# Patient Record
Sex: Female | Born: 1950 | Race: White | Hispanic: No | Smoking: Former smoker
Health system: Southern US, Community
[De-identification: ages and names within clinical notes are randomized; demographics above are authoritative.]

## PROBLEM LIST (undated history)

## (undated) DIAGNOSIS — R7303 Prediabetes: Secondary | ICD-10-CM

## (undated) DIAGNOSIS — D649 Anemia, unspecified: Secondary | ICD-10-CM

## (undated) DIAGNOSIS — T4145XA Adverse effect of unspecified anesthetic, initial encounter: Secondary | ICD-10-CM

## (undated) DIAGNOSIS — F32A Depression, unspecified: Secondary | ICD-10-CM

## (undated) DIAGNOSIS — E559 Vitamin D deficiency, unspecified: Secondary | ICD-10-CM

## (undated) DIAGNOSIS — M1990 Unspecified osteoarthritis, unspecified site: Secondary | ICD-10-CM

## (undated) DIAGNOSIS — T1491XA Suicide attempt, initial encounter: Secondary | ICD-10-CM

## (undated) DIAGNOSIS — K631 Perforation of intestine (nontraumatic): Secondary | ICD-10-CM

## (undated) DIAGNOSIS — G4700 Insomnia, unspecified: Secondary | ICD-10-CM

## (undated) DIAGNOSIS — F419 Anxiety disorder, unspecified: Secondary | ICD-10-CM

## (undated) DIAGNOSIS — J189 Pneumonia, unspecified organism: Secondary | ICD-10-CM

## (undated) DIAGNOSIS — K219 Gastro-esophageal reflux disease without esophagitis: Secondary | ICD-10-CM

## (undated) DIAGNOSIS — T8859XA Other complications of anesthesia, initial encounter: Secondary | ICD-10-CM

## (undated) DIAGNOSIS — E785 Hyperlipidemia, unspecified: Secondary | ICD-10-CM

## (undated) DIAGNOSIS — F329 Major depressive disorder, single episode, unspecified: Secondary | ICD-10-CM

## (undated) HISTORY — DX: Insomnia, unspecified: G47.00

## (undated) HISTORY — DX: Prediabetes: R73.03

## (undated) HISTORY — PX: REDUCTION MAMMAPLASTY: SUR839

## (undated) HISTORY — DX: Anxiety disorder, unspecified: F41.9

## (undated) HISTORY — DX: Vitamin D deficiency, unspecified: E55.9

## (undated) HISTORY — DX: Suicide attempt, initial encounter: T14.91XA

## (undated) HISTORY — DX: Perforation of intestine (nontraumatic): K63.1

## (undated) HISTORY — DX: Gastro-esophageal reflux disease without esophagitis: K21.9

## (undated) HISTORY — PX: DEBRIDEMENT TENNIS ELBOW: SHX1442

## (undated) HISTORY — DX: Hyperlipidemia, unspecified: E78.5

## (undated) HISTORY — DX: Depression, unspecified: F32.A

## (undated) HISTORY — PX: DILATION AND CURETTAGE OF UTERUS: SHX78

## (undated) HISTORY — PX: COSMETIC SURGERY: SHX468

## (undated) HISTORY — PX: EYE SURGERY: SHX253

## (undated) HISTORY — DX: Major depressive disorder, single episode, unspecified: F32.9

---

## 1997-08-07 HISTORY — PX: ABDOMINAL HYSTERECTOMY: SHX81

## 1997-09-10 ENCOUNTER — Other Ambulatory Visit: Admission: RE | Admit: 1997-09-10 | Discharge: 1997-09-10 | Payer: Self-pay | Admitting: Gynecology

## 1998-08-12 ENCOUNTER — Inpatient Hospital Stay (HOSPITAL_COMMUNITY): Admission: RE | Admit: 1998-08-12 | Discharge: 1998-08-13 | Payer: Self-pay | Admitting: Gynecology

## 1999-08-08 HISTORY — PX: BREAST SURGERY: SHX581

## 1999-08-18 ENCOUNTER — Other Ambulatory Visit: Admission: RE | Admit: 1999-08-18 | Discharge: 1999-08-18 | Payer: Self-pay | Admitting: Gynecology

## 1999-08-31 ENCOUNTER — Encounter: Payer: Self-pay | Admitting: Plastic Surgery

## 1999-08-31 ENCOUNTER — Encounter: Admission: RE | Admit: 1999-08-31 | Discharge: 1999-08-31 | Payer: Self-pay | Admitting: Plastic Surgery

## 1999-09-08 ENCOUNTER — Other Ambulatory Visit: Admission: RE | Admit: 1999-09-08 | Discharge: 1999-09-08 | Payer: Self-pay | Admitting: Plastic Surgery

## 2000-04-20 ENCOUNTER — Encounter: Payer: Self-pay | Admitting: Plastic Surgery

## 2000-04-20 ENCOUNTER — Encounter: Admission: RE | Admit: 2000-04-20 | Discharge: 2000-04-20 | Payer: Self-pay | Admitting: Plastic Surgery

## 2000-09-05 ENCOUNTER — Other Ambulatory Visit: Admission: RE | Admit: 2000-09-05 | Discharge: 2000-09-05 | Payer: Self-pay | Admitting: Gynecology

## 2001-07-26 ENCOUNTER — Encounter: Admission: RE | Admit: 2001-07-26 | Discharge: 2001-07-26 | Payer: Self-pay | Admitting: Gynecology

## 2001-07-26 ENCOUNTER — Encounter: Payer: Self-pay | Admitting: Gynecology

## 2001-10-17 ENCOUNTER — Other Ambulatory Visit: Admission: RE | Admit: 2001-10-17 | Discharge: 2001-10-17 | Payer: Self-pay | Admitting: Gynecology

## 2002-11-04 ENCOUNTER — Other Ambulatory Visit: Admission: RE | Admit: 2002-11-04 | Discharge: 2002-11-04 | Payer: Self-pay | Admitting: Gynecology

## 2002-11-28 ENCOUNTER — Encounter: Payer: Self-pay | Admitting: Gynecology

## 2002-11-28 ENCOUNTER — Encounter: Admission: RE | Admit: 2002-11-28 | Discharge: 2002-11-28 | Payer: Self-pay | Admitting: Gynecology

## 2004-05-26 ENCOUNTER — Encounter: Admission: RE | Admit: 2004-05-26 | Discharge: 2004-05-26 | Payer: Self-pay | Admitting: Gynecology

## 2005-06-01 ENCOUNTER — Ambulatory Visit (HOSPITAL_COMMUNITY): Admission: RE | Admit: 2005-06-01 | Discharge: 2005-06-01 | Payer: Self-pay | Admitting: Gynecology

## 2005-08-07 HISTORY — PX: ELBOW SURGERY: SHX618

## 2005-09-27 ENCOUNTER — Other Ambulatory Visit: Admission: RE | Admit: 2005-09-27 | Discharge: 2005-09-27 | Payer: Self-pay | Admitting: Gynecology

## 2006-07-18 LAB — HM COLONOSCOPY: HM Colonoscopy: NORMAL

## 2006-11-05 ENCOUNTER — Ambulatory Visit (HOSPITAL_COMMUNITY): Admission: RE | Admit: 2006-11-05 | Discharge: 2006-11-05 | Payer: Self-pay | Admitting: Gynecology

## 2008-01-16 ENCOUNTER — Ambulatory Visit (HOSPITAL_COMMUNITY): Admission: RE | Admit: 2008-01-16 | Discharge: 2008-01-16 | Payer: Self-pay | Admitting: Gynecology

## 2008-08-07 HISTORY — PX: BLEPHAROPLASTY: SUR158

## 2008-08-07 HISTORY — PX: ULNAR TUNNEL RELEASE: SHX820

## 2009-01-14 ENCOUNTER — Ambulatory Visit (HOSPITAL_COMMUNITY): Admission: RE | Admit: 2009-01-14 | Discharge: 2009-01-14 | Payer: Self-pay | Admitting: Obstetrics & Gynecology

## 2009-01-19 ENCOUNTER — Ambulatory Visit (HOSPITAL_COMMUNITY): Admission: RE | Admit: 2009-01-19 | Discharge: 2009-01-19 | Payer: Self-pay | Admitting: Obstetrics & Gynecology

## 2009-01-27 ENCOUNTER — Ambulatory Visit (HOSPITAL_BASED_OUTPATIENT_CLINIC_OR_DEPARTMENT_OTHER): Admission: RE | Admit: 2009-01-27 | Discharge: 2009-01-27 | Payer: Self-pay | Admitting: *Deleted

## 2009-04-03 ENCOUNTER — Ambulatory Visit: Payer: Self-pay | Admitting: Family Medicine

## 2009-04-03 ENCOUNTER — Inpatient Hospital Stay (HOSPITAL_COMMUNITY): Admission: EM | Admit: 2009-04-03 | Discharge: 2009-04-04 | Payer: Self-pay | Admitting: Emergency Medicine

## 2009-04-04 ENCOUNTER — Ambulatory Visit: Payer: Self-pay | Admitting: Psychiatry

## 2010-03-22 LAB — CONVERTED CEMR LAB: Pap Smear: NORMAL

## 2010-03-22 LAB — HM PAP SMEAR: HM Pap smear: NORMAL

## 2010-04-19 ENCOUNTER — Ambulatory Visit (HOSPITAL_COMMUNITY): Admission: RE | Admit: 2010-04-19 | Discharge: 2010-04-19 | Payer: Self-pay | Admitting: Obstetrics & Gynecology

## 2010-07-21 ENCOUNTER — Encounter: Payer: Self-pay | Admitting: Family Medicine

## 2010-07-21 ENCOUNTER — Ambulatory Visit: Payer: Self-pay | Admitting: Family Medicine

## 2010-07-21 DIAGNOSIS — E785 Hyperlipidemia, unspecified: Secondary | ICD-10-CM | POA: Insufficient documentation

## 2010-07-21 DIAGNOSIS — Z8709 Personal history of other diseases of the respiratory system: Secondary | ICD-10-CM | POA: Insufficient documentation

## 2010-07-21 DIAGNOSIS — K219 Gastro-esophageal reflux disease without esophagitis: Secondary | ICD-10-CM | POA: Insufficient documentation

## 2010-07-21 DIAGNOSIS — F32A Depression, unspecified: Secondary | ICD-10-CM | POA: Insufficient documentation

## 2010-07-21 DIAGNOSIS — F329 Major depressive disorder, single episode, unspecified: Secondary | ICD-10-CM

## 2010-07-29 ENCOUNTER — Ambulatory Visit: Payer: Self-pay | Admitting: Family Medicine

## 2010-08-04 ENCOUNTER — Encounter: Payer: Self-pay | Admitting: Family Medicine

## 2010-08-04 DIAGNOSIS — N390 Urinary tract infection, site not specified: Secondary | ICD-10-CM | POA: Insufficient documentation

## 2010-08-04 LAB — CONVERTED CEMR LAB
Bilirubin Urine: NEGATIVE
Glucose, Urine, Semiquant: NEGATIVE
Ketones, urine, test strip: NEGATIVE
Nitrite: NEGATIVE
Protein, U semiquant: NEGATIVE
Specific Gravity, Urine: 1.01
Urobilinogen, UA: 0.2
pH: 5

## 2010-08-05 LAB — CONVERTED CEMR LAB
ALT: 14 units/L (ref 0–35)
AST: 23 units/L (ref 0–37)
Albumin: 4.1 g/dL (ref 3.5–5.2)
Alkaline Phosphatase: 44 units/L (ref 39–117)
BUN: 10 mg/dL (ref 6–23)
Basophils Absolute: 0 10*3/uL (ref 0.0–0.1)
Basophils Relative: 0.6 % (ref 0.0–3.0)
Bilirubin, Direct: 0.1 mg/dL (ref 0.0–0.3)
CO2: 29 meq/L (ref 19–32)
Calcium: 9.4 mg/dL (ref 8.4–10.5)
Chloride: 104 meq/L (ref 96–112)
Cholesterol: 305 mg/dL — ABNORMAL HIGH (ref 0–200)
Creatinine, Ser: 0.9 mg/dL (ref 0.4–1.2)
Direct LDL: 186.4 mg/dL
Eosinophils Absolute: 0.2 10*3/uL (ref 0.0–0.7)
Eosinophils Relative: 3.1 % (ref 0.0–5.0)
GFR calc non Af Amer: 70.13 mL/min (ref >60.00)
Glucose, Bld: 99 mg/dL (ref 70–99)
HCT: 38.1 % (ref 36.0–46.0)
HDL: 93.9 mg/dL (ref >39.00)
Hemoglobin: 12.9 g/dL (ref 12.0–15.0)
Lymphocytes Relative: 36.8 % (ref 12.0–46.0)
Lymphs Abs: 1.8 10*3/uL (ref 0.7–4.0)
MCHC: 33.8 g/dL (ref 30.0–36.0)
MCV: 94 fL (ref 78.0–100.0)
Monocytes Absolute: 0.3 10*3/uL (ref 0.1–1.0)
Monocytes Relative: 6.4 % (ref 3.0–12.0)
Neutro Abs: 2.6 10*3/uL (ref 1.4–7.7)
Neutrophils Relative %: 53.1 % (ref 43.0–77.0)
Platelets: 302 10*3/uL (ref 150.0–400.0)
Potassium: 4.6 meq/L (ref 3.5–5.1)
RBC: 4.06 M/uL (ref 3.87–5.11)
RDW: 14.6 % (ref 11.5–14.6)
Sodium: 141 meq/L (ref 135–145)
TSH: 2 microintl units/mL (ref 0.35–5.50)
Total Bilirubin: 0.6 mg/dL (ref 0.3–1.2)
Total CHOL/HDL Ratio: 3
Total Protein: 7 g/dL (ref 6.0–8.3)
Triglycerides: 134 mg/dL (ref 0.0–149.0)
VLDL: 26.8 mg/dL (ref 0.0–40.0)
WBC: 4.9 10*3/uL (ref 4.5–10.5)

## 2010-09-08 NOTE — Assessment & Plan Note (Signed)
Summary: new to estab/cpx/cbs   Vital Signs:  Patient profile:   60 year old female Menstrual status:  hysterectomy Height:      66.75 inches Weight:      153.8 pounds BMI:     24.36 Temp:     98.0 degrees F oral BP sitting:   126 / 74  Vitals Entered By: Almeta Monas CMA Duncan Dull) (July 21, 2010 2:25 PM) CC: New Est Care---CPX no pap or breast exam-- mammogram 8/11 pap 8/11-- colonoscopy 2007     Menstrual Status hysterectomy Last PAP Result normal   History of Present Illness: Pt here to establish and have cpe.   No pap.--  Pt sees Dr Leda Quail. Pt recently had pyleonephritis but never went to hospital.  She is finally better.    Preventive Screening-Counseling & Management  Alcohol-Tobacco     Alcohol drinks/day: <1     Alcohol type: beer, wine     Smoking Status: never  Caffeine-Diet-Exercise     Caffeine use/day: 6     Does Patient Exercise: yes     Type of exercise: gym     Times/week: <3  Hep-HIV-STD-Contraception     Dental Visit-last 6 months yes     Dental Care Counseling: not indicated; dental care within six months     SBE monthly: no     SBE Education/Counseling: to perform regular SBE      Sexual History:  same sex encounters and single.        Drug Use:  no.    Current Medications (verified): 1)  Wellbutrin Xl 300 Mg Xr24h-Tab (Bupropion Hcl) .Marland Kitchen.. 1 By Mouth Once Daily 2)  Terbinafine Hcl 250 Mg Tabs (Terbinafine Hcl) .Marland Kitchen.. 1 By Mouth Once Daily 3)  Zantac 150 Mg Tabs (Ranitidine Hcl) .Marland Kitchen.. 1 By Mouth Once Daily  Allergies (verified): No Known Drug Allergies  Past History:  Family History: Last updated: 07/21/2010 Family History Diabetes 1st degree relative--Mother Family History High cholesterol-- Father Family History Hypertension-- Mother  Social History: Last updated: 07/21/2010 Occupation:  [place] [employer] Single Never Smoked Alcohol use-yes--socially Drug use-no Regular exercise-yes  Risk Factors: Alcohol Use: <1  (07/21/2010) Caffeine Use: 6 (07/21/2010) Exercise: yes (07/21/2010)  Risk Factors: Smoking Status: never (07/21/2010)  Past Medical History: Depression GERD Hyperlipidemia Pneumonia, hx of Pyelonephritis  Past Surgical History: Hysterectomy--1999 Elbow surgery--2007 Breast Reduction--2001 Ulnar release-- 2010 Blephroplasty-- 2010  Family History: Reviewed history and no changes required. Family History Diabetes 1st degree relative--Mother Family History High cholesterol-- Father Family History Hypertension-- Mother  Social History: Reviewed history and no changes required. Occupation:  [employer] Single Never Smoked Alcohol use-yes--socially Drug use-no Regular exercise-yes Occupation:  employed Smoking Status:  never Drug Use:  no Does Patient Exercise:  yes Caffeine use/day:  6 Dental Care w/in 6 mos.:  yes Sexual History:  same sex encounters, single  Review of Systems      See HPI General:  Denies chills, fatigue, fever, loss of appetite, malaise, sleep disorder, sweats, weakness, and weight loss. Eyes:  Denies blurring, discharge, double vision, eye irritation, eye pain, halos, itching, light sensitivity, red eye, vision loss-1 eye, and vision loss-both eyes. ENT:  Denies decreased hearing, difficulty swallowing, ear discharge, earache, hoarseness, nasal congestion, nosebleeds, postnasal drainage, ringing in ears, sinus pressure, and sore throat. CV:  Denies bluish discoloration of lips or nails, chest pain or discomfort, difficulty breathing at night, difficulty breathing while lying down, fainting, fatigue, leg cramps with exertion, lightheadness, near fainting, palpitations, shortness of  breath with exertion, swelling of feet, swelling of hands, and weight gain. Resp:  Denies chest discomfort, chest pain with inspiration, cough, coughing up blood, excessive snoring, hypersomnolence, morning headaches, pleuritic, shortness of breath, sputum productive,  and wheezing. GI:  Denies abdominal pain, bloody stools, change in bowel habits, constipation, dark tarry stools, diarrhea, excessive appetite, gas, hemorrhoids, indigestion, loss of appetite, nausea, vomiting, vomiting blood, and yellowish skin color. GU:  Denies abnormal vaginal bleeding, decreased libido, discharge, dysuria, genital sores, hematuria, incontinence, nocturia, urinary frequency, and urinary hesitancy. MS:  Denies joint pain, joint redness, joint swelling, loss of strength, low back pain, mid back pain, muscle aches, muscle , cramps, muscle weakness, stiffness, and thoracic pain. Derm:  Denies changes in color of skin, changes in nail beds, dryness, excessive perspiration, flushing, hair loss, insect bite(s), itching, lesion(s), poor wound healing, and rash. Neuro:  Denies brief paralysis, difficulty with concentration, disturbances in coordination, falling down, headaches, inability to speak, memory loss, numbness, poor balance, seizures, sensation of room spinning, tingling, tremors, visual disturbances, and weakness. Psych:  Denies alternate hallucination ( auditory/visual), anxiety, depression, easily angered, easily tearful, irritability, mental problems, panic attacks, sense of great danger, suicidal thoughts/plans, thoughts of violence, unusual visions or sounds, and thoughts /plans of harming others. Endo:  Denies cold intolerance, excessive hunger, excessive thirst, excessive urination, heat intolerance, polyuria, and weight change. Heme:  Denies abnormal bruising, bleeding, enlarge lymph nodes, fevers, pallor, and skin discoloration. Allergy:  Denies hives or rash, itching eyes, persistent infections, seasonal allergies, and sneezing.  Physical Exam  General:  Well-developed,well-nourished,in no acute distress; alert,appropriate and cooperative throughout examination Head:  Normocephalic and atraumatic without obvious abnormalities. No apparent alopecia or balding. Eyes:  No  corneal or conjunctival inflammation noted. EOMI. Perrla. Funduscopic exam benign, without hemorrhages, exudates or papilledema. Vision grossly normal. Ears:  External ear exam shows no significant lesions or deformities.  Otoscopic examination reveals clear canals, tympanic membranes are intact bilaterally without bulging, retraction, inflammation or discharge. Hearing is grossly normal bilaterally. Nose:  External nasal examination shows no deformity or inflammation. Nasal mucosa are pink and moist without lesions or exudates. Mouth:  Oral mucosa and oropharynx without lesions or exudates.  Teeth in good repair. Neck:  No deformities, masses, or tenderness noted. Breasts:  gyn Lungs:  Normal respiratory effort, chest expands symmetrically. Lungs are clear to auscultation, no crackles or wheezes. Heart:  normal rate and no murmur.   Abdomen:  Bowel sounds positive,abdomen soft and non-tender without masses, organomegaly or hernias noted. Rectal:  gyn Genitalia:  gyn Msk:  normal ROM, no joint tenderness, no joint swelling, no joint warmth, no redness over joints, no joint deformities, no joint instability, and no crepitation.   Pulses:  R and L carotid,radial,femoral,dorsalis pedis and posterior tibial pulses are full and equal bilaterally Extremities:  No clubbing, cyanosis, edema, or deformity noted with normal full range of motion of all joints.   Neurologic:  No cranial nerve deficits noted. Station and gait are normal. Plantar reflexes are down-going bilaterally. DTRs are symmetrical throughout. Sensory, motor and coordinative functions appear intact. Skin:  Intact without suspicious lesions or rashes Cervical Nodes:  No lymphadenopathy noted Axillary Nodes:  No palpable lymphadenopathy Psych:  Cognition and judgment appear intact. Alert and cooperative with normal attention span and concentration. No apparent delusions, illusions, hallucinations   Impression & Recommendations:  Problem  # 1:  PREVENTIVE HEALTH CARE (ICD-V70.0)  check fasting labs check mamm ghm utd  Orders: EKG w/ Interpretation (93000)  Problem # 2:  HYPERLIPIDEMIA (ICD-272.4)  Orders: EKG w/ Interpretation (93000)  Problem # 3:  DEPRESSION (ICD-311)  Her updated medication list for this problem includes:    Wellbutrin Xl 300 Mg Xr24h-tab (Bupropion hcl) .Marland Kitchen... 1 by mouth once daily  Orders: EKG w/ Interpretation (93000)  Problem # 4:  GERD (ICD-530.81)  Her updated medication list for this problem includes:    Zantac 150 Mg Tabs (Ranitidine hcl) .Marland Kitchen... 1 by mouth once daily  Diagnostics Reviewed:  Discussed lifestyle modifications, diet, antacids/medications, and preventive measures. Handout provided.   Orders: EKG w/ Interpretation (93000)  Complete Medication List: 1)  Wellbutrin Xl 300 Mg Xr24h-tab (Bupropion hcl) .Marland Kitchen.. 1 by mouth once daily 2)  Terbinafine Hcl 250 Mg Tabs (Terbinafine hcl) .Marland Kitchen.. 1 by mouth once daily 3)  Zantac 150 Mg Tabs (Ranitidine hcl) .Marland Kitchen.. 1 by mouth once daily  Patient Instructions: 1)  v70.0  cbcd, bmp, hep, lipid, TSH, UA ---fasting labs   Orders Added: 1)  New Patient 40-64 years [99386] 2)  EKG w/ Interpretation [93000]   Immunization History:  Tetanus/Td Immunization History:    Tetanus/Td:  historical (08/08/2007)   Immunization History:  Tetanus/Td Immunization History:    Tetanus/Td:  Historical (08/08/2007)    Flu Vaccine Next Due:  Refused TD Result Date:  07/22/2008 TD Result:  given TD Next Due:  10 yr Colonoscopy Result Date:  07/18/2006 Colonoscopy Result:  normal Colonoscopy Next Due:  10 yr PAP Result Date:  03/22/2010 PAP Result:  normal PAP Next Due:  1 yr Mammogram Result Date:  03/22/2010 Mammogram Result:  normal Mammogram Next Due:  1 yr

## 2010-10-17 ENCOUNTER — Encounter (INDEPENDENT_AMBULATORY_CARE_PROVIDER_SITE_OTHER): Payer: Self-pay | Admitting: *Deleted

## 2010-10-17 ENCOUNTER — Telehealth (INDEPENDENT_AMBULATORY_CARE_PROVIDER_SITE_OTHER): Payer: Self-pay | Admitting: *Deleted

## 2010-10-25 NOTE — Letter (Signed)
Summary: Generic Letter  [facility] [place]  [address] [place], [place] [address]   Phone: [phone]  Fax: [phone]    10/17/2010    [name] [address] [place], [place]  [address]    To whom it may concern:    The above patient [name] has had a Physical Exam on 07/21/10 . If you have any question or concerns, please contact the office at [phone].         Sincerely,       Loreen Freud DO

## 2010-10-25 NOTE — Progress Notes (Signed)
Summary: Letter confirming physical  done....  ---- Converted from flag ---- ---- 10/14/2010 2:35 PM, [name] wrote: patient wants a letter mailed to her stating she had acpx in the last year - patient cpx was [id number] ------------------------------

## 2010-11-12 LAB — POCT I-STAT, CHEM 8
BUN: 3 mg/dL — ABNORMAL LOW (ref 6–23)
Calcium, Ion: 1.08 mmol/L — ABNORMAL LOW (ref 1.12–1.32)
Chloride: 102 mEq/L (ref 96–112)
Creatinine, Ser: 0.9 mg/dL (ref 0.4–1.2)
Glucose, Bld: 101 mg/dL — ABNORMAL HIGH (ref 70–99)
HCT: 38 % (ref 36.0–46.0)
Hemoglobin: 12.9 g/dL (ref 12.0–15.0)
Potassium: 3.8 mEq/L (ref 3.5–5.1)
Sodium: 137 mEq/L (ref 135–145)
TCO2: 23 mmol/L (ref 0–100)

## 2010-11-12 LAB — COMPREHENSIVE METABOLIC PANEL
ALT: 17 U/L (ref 0–35)
AST: 25 U/L (ref 0–37)
Albumin: 4 g/dL (ref 3.5–5.2)
Alkaline Phosphatase: 34 U/L — ABNORMAL LOW (ref 39–117)
BUN: 3 mg/dL — ABNORMAL LOW (ref 6–23)
CO2: 23 mEq/L (ref 19–32)
Calcium: 8.8 mg/dL (ref 8.4–10.5)
Chloride: 100 mEq/L (ref 96–112)
Creatinine, Ser: 0.75 mg/dL (ref 0.4–1.2)
GFR calc Af Amer: >60 mL/min (ref >60)
GFR calc non Af Amer: >60 mL/min (ref >60)
Glucose, Bld: 103 mg/dL — ABNORMAL HIGH (ref 70–99)
Potassium: 3.6 mEq/L (ref 3.5–5.1)
Sodium: 136 mEq/L (ref 135–145)
Total Bilirubin: 0.4 mg/dL (ref 0.3–1.2)
Total Protein: 6.5 g/dL (ref 6.0–8.3)

## 2010-11-12 LAB — BASIC METABOLIC PANEL
BUN: 5 mg/dL — ABNORMAL LOW (ref 6–23)
CO2: 24 mEq/L (ref 19–32)
Calcium: 9 mg/dL (ref 8.4–10.5)
Chloride: 108 mEq/L (ref 96–112)
Creatinine, Ser: 0.62 mg/dL (ref 0.4–1.2)
GFR calc Af Amer: >60 mL/min (ref >60)
GFR calc non Af Amer: >60 mL/min (ref >60)
Glucose, Bld: 76 mg/dL (ref 70–99)
Potassium: 4 mEq/L (ref 3.5–5.1)
Sodium: 141 mEq/L (ref 135–145)

## 2010-11-12 LAB — ETHANOL: Alcohol, Ethyl (B): 134 mg/dL — ABNORMAL HIGH (ref 0–10)

## 2010-11-12 LAB — TRICYCLICS SCREEN, URINE: TCA Scrn: NOT DETECTED

## 2010-11-12 LAB — RAPID URINE DRUG SCREEN, HOSP PERFORMED
Amphetamines: POSITIVE — AB
Barbiturates: NOT DETECTED
Benzodiazepines: POSITIVE — AB
Cocaine: NOT DETECTED
Opiates: POSITIVE — AB
Tetrahydrocannabinol: NOT DETECTED

## 2010-11-12 LAB — ACETAMINOPHEN LEVEL
Acetaminophen (Tylenol), Serum: <10 ug/mL — ABNORMAL LOW (ref 10–30)
Acetaminophen (Tylenol), Serum: <10 ug/mL — ABNORMAL LOW (ref 10–30)

## 2010-11-12 LAB — GLUCOSE, CAPILLARY: Glucose-Capillary: 107 mg/dL — ABNORMAL HIGH (ref 70–99)

## 2010-11-12 LAB — MAGNESIUM
Magnesium: 2.1 mg/dL (ref 1.5–2.5)
Magnesium: 2.4 mg/dL (ref 1.5–2.5)

## 2010-11-12 LAB — SALICYLATE LEVEL: Salicylate Lvl: <4 mg/dL (ref 2.8–20.0)

## 2010-11-14 LAB — POCT HEMOGLOBIN-HEMACUE: Hemoglobin: 15.2 g/dL — ABNORMAL HIGH (ref 12.0–15.0)

## 2010-12-20 NOTE — Discharge Summary (Signed)
NAMEGLORIE, DOWLEN             ACCOUNT NO.:  [id number]   MEDICAL RECORD NO.:  [id number]          PATIENT TYPE:  INP   LOCATION:  4740                         FACILITY:  [facility]   PHYSICIAN:  Leighton Roach McDiarmid, M.D.DATE OF BIRTH:  [birth year]   DATE OF ADMISSION:  04/03/2009  DATE OF DISCHARGE:  04/04/2009                               DISCHARGE SUMMARY   ADDENDUM:   FOLLOWUP INSTRUCTIONS:  The patient is advised to make an appointment  with her Dr. [name].  The patient states she has a scheduled appointment  towards the middle of this month and the patient was advised to keep  that appointment.  The patient was also advised to schedule an  appointment at her earliest convenience early this week to talk with Dr.  Valetta Close, her community therapist.  The patient states she was planning to  do this.   DISCHARGE INSTRUCTIONS:  The patient was instructed to call her regular  doctor or return in the emergency department if any chest pain,  difficulty breathing, mental status changes, intractable nausea and  vomiting, or any concerns.   DISCHARGE CONDITION:  As the patient was denying any suicidal ideations  at this time and was cleared the Psych consult.  The patient was  discharged to home in good medical condition.      Renold Don, MD  Electronically Signed      Leighton Roach McDiarmid, M.D.  Electronically Signed    JW/MEDQ  D:  04/04/2009  T:  04/04/2009  Job:  [id number]

## 2010-12-20 NOTE — Discharge Summary (Signed)
[name], [name]             ACCOUNT NO.:  [id number]   MEDICAL RECORD NO.:  [id number]          PATIENT TYPE:  INP   LOCATION:  4740                         FACILITY:  [facility]   PHYSICIAN:  Leighton Roach McDiarmid, M.D.DATE OF BIRTH:  1951-05-02   DATE OF ADMISSION:  04/03/2009  DATE OF DISCHARGE:  04/04/2009                               DISCHARGE SUMMARY   PRIMARY CARE PHYSICIAN:  Brett Canales A. [name], M.D., [facility].   DISCHARGE DIAGNOSES:  1. Intentional drug overdose.  2. Hyperlipidemia.   CONSULTATIONS:  Psychology.   PROCEDURES:  None.   LABORATORY DATA:  On admission, BMP within normal limits except for a  BUN of 3.  Acetaminophen level less than 10, ethyl alcohol level high at  134, salicylate level less than 4.  Urine drug screen positive for  amphetamines, benzodiazepines, opiates.  Tricyclics not detected.  Magnesium 2.1.  Discharge BMP within normal limits, BUN 5, magnesium of  blood 2.4.   BRIEF HOSPITAL COURSE:  This patient is a 60 year old female admitted  secondary to intentional drug overdose.   PROBLEMS:  Drug overdose.  The patient was admitted to Step Down.  AMA  protocol was started for withdrawal and prevention of seizures.  The  patient does not have any seizures in house.  There is a worry for  seizures as the patient was on Wellbutrin and have been prescribed  Wellbutrin by her primary care physician for depression.  Tylenol levels  remained below less than 10 and the entire time she was in house.  Electrocardiogram showed initially prolonged QT, which is side effect of  Wellbutrin.  However, EKG, the next day showed normal sinus rhythm with  normal QT.  BMET remained within normal limits for duration of in house  stay.  Psychology saw the patient, after evaluated the patient, stated  that the patient denied any suicidal ideations at this time.  Recommended, the patient was okay for discharge.  Also, recommended to  the patient that they  followup with their community therapist.  Prior to  admission, poison control was consulted and since they recommend that we  monitor for  withdrawal, the patient was started on thiamine, folate, and Narcan.  The patient's vital signs remained stable throughout hospital stay.  Due  to psych recommendation, the patient was discharged to home the day  after admission.      Renold Don, MD  Electronically Signed      Leighton Roach McDiarmid, M.D.  Electronically Signed    JW/MEDQ  D:  04/04/2009  T:  04/04/2009  Job:  [id number]

## 2010-12-20 NOTE — H&P (Signed)
[name], [name]             ACCOUNT NO.:  [id number]   MEDICAL RECORD NO.:  [id number]          PATIENT TYPE:  INP   LOCATION:  3301                         FACILITY:  [facility]   PHYSICIAN:  Leighton Roach McDiarmid, M.D.DATE OF BIRTH:  Jun 17, 1951   DATE OF ADMISSION:  04/03/2009  DATE OF DISCHARGE:                              HISTORY & PHYSICAL   PRIMARY CARE PHYSICIAN:  Brett Canales A. [name], MD, at [facility]   CHIEF COMPLAINT:  Intentional drug overdose.   HISTORY OF PRESENT ILLNESS:  This is a 60 year old female, who was  brought to the emergency room by EMS.  The patient was found unconscious  by her neighbor in her car.  EMS was called and enroute to [facility], EMS gave the patient Narcan.  The patient responded to Narcan,  and EMS was able to obtain very little information.  During my  examination, patient was very somnolent and could not participate in the  interview.  She was able to tell me her name but fell back asleep after  that.  During my physical exam, the patient woke up to say I am sad  and once again fell back asleep.  Most of the information from the  history came from [facility], the emergency room PA, and EMS.  I  also was able to reach [name] [name], PA at [facility],  and she was able to give me some of the information from reading the  chart notes.  Patient was seen yesterday by Dr. [name] when she endorsed  depression and anxiety.  After talking with the patient extensively for  over 1.5 hours, Dr. [name] prescribed Wellbutrin and Alprazolam for the  patient.  The patient at that time endorsed that she and her domestic  partner had been arguing for the last few weeks and that she was  concerned that her domestic partner was having an affair.  Looking  through the patient notes, [name] [name] was able to tell me that Dr.  [name] prescribed Wellbutrin-XL 150 mg, 7 tablets; Wellbutrin-XL 300 mg,  30 tablets; alprazolam 0.5 mg,  10 tablets.  After reaching [facility], they also inform us that patient picked up a prescription also  yesterday for diazepam 5 mg 6 tablets that was prescribed to her by her  ophthalmologist, Dr. [name], for an eye procedure.  Patient also was  prescribed hydromorphone 2 mg on January 27, 2009, by Dr. [name], who  is a Hydrographic surveyor.   PAST MEDICAL HISTORY:  1. Hyperlipidemia.  2. The rest of her history is unknown.   PAST SURGICAL HISTORY:  Unknown.   SOCIAL HISTORY:  The patient lives with her domestic partner, name  [name].   OCCUPATION:  The patient works at the [employer].   FAMILY HISTORY:  Unable to obtain.   REVIEW OF SYSTEMS:  Unable to obtain.   PHYSICAL EXAMINATION:  VITAL SIGNS:  Temperature 97.3, pulse 107,  respiratory 18, blood pressure 114/71, pulse-ox 100 % on 2 L.  GENERAL:  The patient is very somnolent and not  able to cooperate with exam, but  in no apparent distress or agitation.  HEENT:  A purplish contusion about 1.5 inches under left eye.  Pupils  are pinpoint and not reactive to light.  NECK:  No nodes, no masses,  supple.  CVS:  Tachy, otherwise regular, no murmurs.  LUNGS:  Clear to auscultation bilaterally, no wheezing.  ABDOMEN:  Soft, nontender, and nondistended, positive bowel sounds.  BACK:  No masses.  GU:  Deferred.  RECTAL:  Deferred.  EXTREMITIES:  No clubbing, cyanosis, or edema.  NEURO:  The patient cannot follow commands.  Reflexes +2 bilaterally.  The rest of the neuro exam not able to evaluate.   LABS AND STUDIES:  1. ISTAT last showed CO2 23, hemoglobin 12.9, hematocrit 38.0, sodium      137, potassium 3.8, chloride 102, bicarb 23, BUN 3, creatinine 0.9,      glucose 101.  2. Acetaminophen level less than 10.0.  3. Alcohol level 134.  4. Salicylate level less than 4.0.  5. Urine drug screen was positive for amphetamine, benzodiazepine,      opiates.  Triglycerides not detected.  C  6. MET  that showed sodium 136, potassium 3.6, chloride 100, bicarb 23,      BUN 3, creatinine 0.75, glucose 100.  Total bilirubin 0.4, alkaline      phosphatase 34, AST 25, ALT 17, total protein 6.5, albumin 4.0,      calcium 8.8.   ASSESSMENT AND PLAN:  This is a 57-year female, who was admitted for  intentional drug overdose.  1. Intentional overdose.  We will admit patient to stepdown.  Ativan      protocol will be started for withdrawal and seizures.  Wellbutrin      has a [name]-life of 18 hours.  This event seemed to have occurred      between the hours of 3 a.m. and admission.  We anticipate that      patient will go into withdrawal or have seizures later tonight and      for the next 2 days.  We will repeat Tylenol level in 4 hours.  We      will repeat electrocardiogram in the morning with concerns for a      prolonged QT, which is a side effect of Wellbutrin.  We will repeat      BMET in the a.m. with concerns for sodium level and other      electrolytes with concern for seizures.  We will consult Psychology      for Behavioral Health admission once the patient is stable and more      alert.  Poison Control was consulted by the emergency department      and have advised that we continue to monitor for withdrawal and      seizures.  They did not recommend any frequency of testing.  We      will start patient on thiamine, folate, and will have Narcan at      bedside.  Her labs currently showed that she is acidotic, her liver      function tests are within normal limits, and electrolytes are      normal.  Vital signs have been stable so far.  2. Contusion under  left eye.  We are concerned for a head injury.      Patient may have fallen.  We were not given this information.  We      will get a head computerized  tomography to evaluate.  3. Fluids, electrolytes, nutrition/gastrointestinal:  We will NPO the      patient for now until she is more alert.  We will start the patient       one-[name] normal saline at 125 ml per hour for maintenance.  4. Prophylaxis.  Heparin 5000 units subcutaneously q.8 hours.  5. Disposition.  We will continue close monitoring for withdrawal and      seizures.  We will consult Psychiatry.  We do not anticipate that      patient will be discharged this weekend.      Angeline Slim, MD  Electronically Signed      Leighton Roach McDiarmid, M.D.  Electronically Signed    CT/MEDQ  D:  04/03/2009  T:  04/03/2009  Job:  [id number]   cc:   Brett Canales A. [name], M.D.

## 2010-12-20 NOTE — Op Note (Signed)
NAMECASSITY, [name]             ACCOUNT NO.:  [id number]   MEDICAL RECORD NO.:  [id number]          PATIENT TYPE:  AMB   LOCATION:  DSC                          FACILITY:  [facility]   PHYSICIAN:  Tennis Must Meyerdierks, M.D.DATE OF BIRTH:  16-Oct-1950   DATE OF PROCEDURE:  01/27/2009  DATE OF DISCHARGE:                               OPERATIVE REPORT   PREOPERATIVE DIAGNOSIS:  Ulnar nerve compression, right elbow.   POSTOPERATIVE DIAGNOSIS:  Ulnar nerve compression, right elbow.   PROCEDURE:  Neurolysis of ulnar nerve, right elbow.   SURGEON:  Lowell Bouton, MD   ANESTHESIA:  Axillary block augmented with general.   OPERATIVE FINDINGS:  The patient had no signs of significant compression  of the nerve.  There appeared to be good circulation to the nerve.  It  was not subluxing over the medial epicondyle.   PROCEDURE:  Under general anesthesia with a tourniquet on the right arm,  the right arm was prepped and draped in usual fashion.  After  exsanguinating the limb, the tourniquet was inflated to 250 mmHg.  A  gently curved incision was made just behind the medial epicondyle and  carried down through the subcutaneous tissues.  Bleeding points were  coagulated.  Blunt dissection was carried down through the subcutaneous  tissues, and the ulnar nerve was identified at the medial epicondyle.  It was traced proximally approximately 6 cm above the medial epicondyle  and a neurolysis was performed.  It was then traced distally and care  was taken to protect the branches to the FCU.  After complete neurolysis  from 6 cm proximal to 6 cm distal, the elbow was flexed and extended and  there was no subluxation of the nerve.  The nerve was not  circumferentially released.  The wound was then irrigated copiously with  saline.  The subcutaneous tissues were closed with 4-0 Vicryl and the  skin with a 3-0 subcuticular Prolene.  Steri-Strips were applied  followed by sterile  dressings.  The patient tolerated the procedure well  and went to the recovery room awake and stable in good condition.      Lowell Bouton, M.D.  Electronically Signed     EMM/MEDQ  D:  01/27/2009  T:  01/27/2009  Job:  [id number]

## 2011-05-08 ENCOUNTER — Other Ambulatory Visit: Payer: Self-pay | Admitting: Obstetrics & Gynecology

## 2011-05-08 DIAGNOSIS — Z1231 Encounter for screening mammogram for malignant neoplasm of breast: Secondary | ICD-10-CM

## 2011-05-11 ENCOUNTER — Ambulatory Visit (HOSPITAL_COMMUNITY)
Admission: RE | Admit: 2011-05-11 | Discharge: 2011-05-11 | Disposition: A | Discharge status: Home / Self Care | Payer: PRIVATE HEALTH INSURANCE | Source: Ambulatory Visit | Attending: Obstetrics & Gynecology | Admitting: Obstetrics & Gynecology

## 2011-05-11 DIAGNOSIS — Z1231 Encounter for screening mammogram for malignant neoplasm of breast: Secondary | ICD-10-CM

## 2012-01-18 ENCOUNTER — Ambulatory Visit (INDEPENDENT_AMBULATORY_CARE_PROVIDER_SITE_OTHER): Payer: PRIVATE HEALTH INSURANCE | Admitting: Family Medicine

## 2012-01-18 ENCOUNTER — Encounter: Payer: Self-pay | Admitting: Family Medicine

## 2012-01-18 VITALS — BP 114/68 | HR 81 | Temp 98.6°F | Ht 67.0 in | Wt 154.2 lb

## 2012-01-18 DIAGNOSIS — G4700 Insomnia, unspecified: Secondary | ICD-10-CM

## 2012-01-18 MED ORDER — ESZOPICLONE 2 MG PO TABS: 2.0000 mg | ORAL_TABLET | Freq: Every day | ORAL | Status: DC

## 2012-01-18 NOTE — Patient Instructions (Addendum)

## 2012-01-18 NOTE — Progress Notes (Signed)
  Subjective:    Patient ID: [name], female    DOB: [birth year], 61 y.o.   MRN: [id number]  HPI Pt here c/o insomnia that she has had trouble with for years but has gotten worse in the last few months.  She is able to fall asleep but can not stay asleep.  She does snore some but has never been told it was bad.  She denies drinking alcohol late at night.      Review of Systems As above    Objective:   Physical Exam  Constitutional: She is oriented to person, place, and time. She appears well-developed and well-nourished.  Cardiovascular: Normal rate, regular rhythm and normal heart sounds.   No murmur heard. Pulmonary/Chest: Effort normal and breath sounds normal. No respiratory distress. She has no wheezes. She has no rales. She exhibits no tenderness.  Neurological: She is alert and oriented to person, place, and time.  Psychiatric: She has a normal mood and affect. Her behavior is normal. Judgment and thought content normal.          Assessment & Plan:  Insomnia---check ONO                   lunesta 2mg  ---not the night she has ono                     And not every night                   Sleep hygiene discussed with pt                   Consider --sleep eval

## 2012-04-18 ENCOUNTER — Telehealth: Payer: Self-pay | Admitting: Family Medicine

## 2012-04-18 NOTE — Telephone Encounter (Signed)
called needs 15-min f/u visit for labs to be reviewed from GYN lmom

## 2012-04-23 NOTE — Telephone Encounter (Signed)
Discussed with patient and she agreed to repeat labs in 3 months     KP 

## 2012-04-23 NOTE — Telephone Encounter (Signed)
Pt states she does not want to come into the office unless she has to. 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 She states she just took time off from work to move and cannot take more time off work. She states that if Dr. Laury Axon wants to go over the results, she would be glad to do it over the phone. I advised pt I would let Dr. Laury Axon know.

## 2012-04-23 NOTE — Telephone Encounter (Signed)
Labs sent to be scanned and Dr.Lowne aware that she did not want to come in to review labs.      KP

## 2012-06-28 ENCOUNTER — Ambulatory Visit (INDEPENDENT_AMBULATORY_CARE_PROVIDER_SITE_OTHER): Payer: PRIVATE HEALTH INSURANCE | Admitting: Emergency Medicine

## 2012-06-28 VITALS — BP 110/78 | HR 85 | Temp 98.0°F | Resp 16 | Ht 68.0 in | Wt 153.0 lb

## 2012-06-28 DIAGNOSIS — F32A Depression, unspecified: Secondary | ICD-10-CM

## 2012-06-28 DIAGNOSIS — F411 Generalized anxiety disorder: Secondary | ICD-10-CM

## 2012-06-28 DIAGNOSIS — F329 Major depressive disorder, single episode, unspecified: Secondary | ICD-10-CM

## 2012-06-28 MED ORDER — WELLBUTRIN XL 150 MG PO TB24: 150.0000 mg | ORAL_TABLET | Freq: Every day | ORAL | Status: DC
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
## 2012-07-14 NOTE — Progress Notes (Signed)
Reviewed and agree.

## 2012-10-11 ENCOUNTER — Ambulatory Visit: Payer: PRIVATE HEALTH INSURANCE | Admitting: Family Medicine

## 2012-10-18 ENCOUNTER — Encounter: Payer: Self-pay | Admitting: Family Medicine

## 2012-10-18 ENCOUNTER — Ambulatory Visit (INDEPENDENT_AMBULATORY_CARE_PROVIDER_SITE_OTHER): Payer: PRIVATE HEALTH INSURANCE | Admitting: Family Medicine

## 2012-10-18 VITALS — BP 128/75 | HR 74 | Temp 97.8°F | Resp 16 | Ht 68.0 in | Wt 153.0 lb

## 2012-10-18 DIAGNOSIS — M545 Low back pain, unspecified: Secondary | ICD-10-CM

## 2012-10-18 DIAGNOSIS — F418 Other specified anxiety disorders: Secondary | ICD-10-CM

## 2012-10-18 DIAGNOSIS — M4120 Other idiopathic scoliosis, site unspecified: Secondary | ICD-10-CM

## 2012-10-18 DIAGNOSIS — Z87440 Personal history of urinary (tract) infections: Secondary | ICD-10-CM

## 2012-10-18 DIAGNOSIS — F341 Dysthymic disorder: Secondary | ICD-10-CM

## 2012-10-18 DIAGNOSIS — G4700 Insomnia, unspecified: Secondary | ICD-10-CM

## 2012-10-18 LAB — POCT URINALYSIS DIPSTICK
Bilirubin, UA: NEGATIVE
Glucose, UA: NEGATIVE
Ketones, UA: NEGATIVE
Leukocytes, UA: NEGATIVE
Nitrite, UA: NEGATIVE
Protein, UA: NEGATIVE
Spec Grav, UA: <=1.005
Urobilinogen, UA: 0.2
pH, UA: 6

## 2012-10-18 MED ORDER — VENLAFAXINE HCL ER 75 MG PO CP24: ORAL_CAPSULE | ORAL | Status: DC

## 2012-10-18 MED ORDER — ESZOPICLONE 2 MG PO TABS: 2.0000 mg | ORAL_TABLET | Freq: Every day | ORAL | Status: DC

## 2012-10-18 NOTE — Patient Instructions (Addendum)
Your urine had a trace of blood but no other sign of infection; contact the office if your symptoms worsen. Drink plenty of water and stay well hydrated.

## 2012-10-20 NOTE — Progress Notes (Signed)
S;  This 62  Y.o. Cauc female has chronic depression and anxiety with recent  Increase of symptoms due to work-related pressures. Chronic insomnia is an issues also; pt attributes this to side effect of medication (sweating). 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 She has discussed her symptoms w/ a psychiatrist that she works with and has been advised to try a different medication. Pt  c/o excessive sweating w/ Wellbutrin. She has taken Zoloft in the past but had adverse effects. She thought she might try Celexa or Lexapro but the psychiatrist advised against that, given her past hx w/ SSRIs.  Pt also c/o LBP concentrated in L hip. SHe is concerned about this b/o past hx of pyelonephritis. She denies fever/ chills, n/v, abd or pelvic pain or dysuria. She denies recent trama. She has minor pain in other joints and takes OTC analgesic prn.  ROS: As per HPI. Pt has no thoughts of self-harm or harm of others; she is not agitated or had any behavior problems.  O: Filed Vitals:   10/18/12 1131  BP: 128/75  Pulse: 74  Temp: 97.8 F (36.6 C)  Resp: 16   GEN: In NAD; WN,WD. Pt  Is somewhat anxious. HENT: [place]/AT; EOMI w/ clear conj/ sclerae. Oroph clear and moist.  NECK: Supple w/o  LAN or TMG. SKIN: W&D; no erythema , rashes or pallor. COR: RRR. No edema. LUNGS: CTA; normal  resp and effort.  BACK: No CVAT. Mild thoracolumbar spine curvature. MS: L hip- posterior aspect tender at  SI joint. Moves all major joints w/o difficulty. No deformities in ext. No c/c/e. NEURO: A&O x 3; CNs intact. Nonfocal. Mentation- talkative but speech is coherent and not pressured; thought content and  judgement are appropriate.    Results for orders placed in visit on 10/18/12  POCT URINALYSIS DIPSTICK      Result Value Range   Color, UA yellow      Clarity, UA clear     Glucose, UA neg     Bilirubin, UA neg     Ketones, UA neg     Spec Grav, UA <=1.005     Blood, UA trace     pH, UA 6.0     Protein, UA neg     Urobilinogen, UA 0.2     Nitrite, UA neg     Leukocytes, UA Negative      A/P: Insomnia - Plan: eszopiclone (LUNESTA) 2 MG TABS  Depression with anxiety- RX: Effexor XR 75 mg daily for 1 week then increase to 2 capsules daily. Advised pt that sweating may be a side effect of this med also.  Idiopathic scoliosis - Plan: Ambulatory referral to Chiropractic  History of UTI - Plan: POCT urinalysis dipstick  LBP (low back pain) - Plan: Ambulatory referral to Chiropractic

## 2012-10-30 ENCOUNTER — Telehealth: Payer: Self-pay

## 2012-10-30 ENCOUNTER — Other Ambulatory Visit: Payer: Self-pay | Admitting: Family Medicine

## 2012-10-30 MED ORDER — VENLAFAXINE HCL ER 37.5 MG PO CP24: 37.5000 mg | ORAL_CAPSULE | Freq: Every day | ORAL | Status: DC

## 2012-10-30 NOTE — Telephone Encounter (Signed)
Thanks, called patient to advise.

## 2012-10-30 NOTE — Telephone Encounter (Signed)
Dr,Mcpherson, Pt would like to change the dosage on her effexor from 75mg  to 37.5 mg. Also pt would like to switch to the name brand because of the side effects associated with it which include nausea, and diarrhea.  Pharamcy: [facility]  [phone])    Best# [phone]

## 2012-10-30 NOTE — Telephone Encounter (Signed)
I called the Pharmacy and changed medication to Brand Effexor XR 37.5 mg  1 tab daily. Her insurance will cover it but her co-pay= $45.00. Please let her know this.

## 2012-10-30 NOTE — Progress Notes (Signed)
Per pt request, medication was changed to Brand Effexor 37.5 mg 1 capsule daily. I phoned this to pharmacy. Brand is covered but pt's co-pay= $45.00. She will be advised of this.

## 2013-01-24 ENCOUNTER — Ambulatory Visit: Payer: PRIVATE HEALTH INSURANCE | Admitting: Family Medicine

## 2013-04-17 ENCOUNTER — Encounter: Payer: Self-pay | Admitting: Obstetrics & Gynecology

## 2013-05-01 ENCOUNTER — Telehealth: Payer: Self-pay | Admitting: Obstetrics & Gynecology

## 2013-05-06 ENCOUNTER — Encounter: Payer: Self-pay | Admitting: Obstetrics & Gynecology

## 2013-05-06 ENCOUNTER — Ambulatory Visit (INDEPENDENT_AMBULATORY_CARE_PROVIDER_SITE_OTHER): Payer: PRIVATE HEALTH INSURANCE | Admitting: Obstetrics & Gynecology

## 2013-05-06 VITALS — BP 120/80 | HR 68 | Resp 16 | Ht 66.25 in | Wt 149.0 lb

## 2013-05-06 DIAGNOSIS — Z01419 Encounter for gynecological examination (general) (routine) without abnormal findings: Secondary | ICD-10-CM

## 2013-05-06 DIAGNOSIS — Z1239 Encounter for other screening for malignant neoplasm of breast: Secondary | ICD-10-CM

## 2013-05-06 DIAGNOSIS — Z0000 Encounter for general adult medical examination without abnormal findings: Secondary | ICD-10-CM

## 2013-05-06 LAB — POCT URINALYSIS DIPSTICK
Bilirubin, UA: NEGATIVE
Blood, UA: NEGATIVE
Glucose, UA: NEGATIVE
Ketones, UA: NEGATIVE
Nitrite, UA: NEGATIVE
Protein, UA: NEGATIVE
Urobilinogen, UA: NEGATIVE
pH, UA: 6

## 2013-05-06 LAB — HEMOGLOBIN, FINGERSTICK: Hemoglobin, fingerstick: 12.7 g/dL (ref 12.0–16.0)

## 2013-05-06 MED ORDER — ESZOPICLONE 2 MG PO TABS: 2.0000 mg | ORAL_TABLET | Freq: Every day | ORAL | Status: DC

## 2013-05-06 NOTE — Progress Notes (Signed)
62 y.o. G31P2 SingleCaucasianF here for annual exam.  No vaginal bleeding.  Has had some back pain issues this year.  Seeing a chiropractor for this and is better.     Patient's last menstrual period was 08/07/1998.          Sexually active: no  The current method of family planning is status post hysterectomy.    Exercising: yes  off and on gym Smoker:  no  Health Maintenance: Pap:  ?2009 normal History of abnormal Pap:  no MMG:  05/11/11 normal Colonoscopy:  2005/2006 repeat in 10 years BMD:   6/10 normal TDaP:  2012 Screening Labs: done 2013 here, Hb today: 12.7, Urine today: WBC-+1, PH-6   reports that she has never smoked. She has never used smokeless tobacco. She reports that she drinks about 5.0 ounces of alcohol per week. She reports that she does not use illicit drugs.  Past Medical History  Diagnosis Date  . Depression   . GERD (gastroesophageal reflux disease)   . Hyperlipidemia   . Diabetes mellitus without complication   . Anxiety   . Vitamin D deficiency   . Suicide attempt     Past Surgical History  Procedure Laterality Date  . Abdominal hysterectomy  1999  . Elbow surgery  2007  . Ulnar tunnel release  2010  . Blepharoplasty  2010  . Cosmetic surgery    . Eye surgery    . Debridement tennis elbow    . Breast surgery  2001    Breast reduction  . Dilation and curettage of uterus      Current Outpatient Prescriptions  Medication Sig Dispense Refill  . eszopiclone (LUNESTA) 2 MG TABS tablet Take 2 mg by mouth at bedtime. Take immediately before bedtime as needed       No current facility-administered medications for this visit.    Family History  Problem Relation Age of Onset  . Hyperlipidemia Father   . Hypertension Father   . Diabetes Father   . Cancer Father     History of bladder cancer  . Hypertension Mother   . Diabetes Mother   . Heart disease Maternal Grandmother   . Stroke Maternal Grandfather   . Hyperlipidemia Brother   . Cancer  Paternal Grandfather     pancreatic cancer  . Cancer Paternal Grandmother     uterine  . Heart disease Father   . Esophageal cancer Sister     ROS:  Pertinent items are noted in HPI.  Otherwise, a comprehensive ROS was negative.  Exam:   BP 120/80  Pulse 68  Resp 16  Ht 5' 6.25" (1.683 m)  Wt 149 lb (67.586 kg)  BMI 23.86 kg/m2  LMP 08/07/1998    Height: 5' 6.25" (168.3 cm)  Ht Readings from Last 3 Encounters:  05/06/13 5' 6.25" (1.683 m)  10/18/12 5\' 8"  (1.727 m)  06/28/12 5\' 8"  (1.727 m)    General appearance: alert, cooperative and appears stated age Head: Normocephalic, without obvious abnormality, atraumatic Neck: no adenopathy, supple, symmetrical, trachea midline and thyroid normal to inspection and palpation Lungs: clear to auscultation bilaterally Breasts: normal appearance, no masses or tenderness Heart: regular rate and rhythm Abdomen: soft, non-tender; bowel sounds normal; no masses,  no organomegaly Extremities: extremities normal, atraumatic, no cyanosis or edema Skin: Skin color, texture, turgor normal. No rashes or lesions Lymph nodes: Cervical, supraclavicular, and axillary nodes normal. No abnormal inguinal nodes palpated Neurologic: Grossly normal  Pelvic: External genitalia:  no lesions  Urethra:  normal appearing urethra with no masses, tenderness or lesions              Bartholins and Skenes: normal                 Vagina: normal appearing vagina with normal color and discharge, no lesions              Cervix: absent              Pap taken: no Bimanual Exam:  Uterus:  uterus absent              Adnexa: no mass, fullness, tenderness               Rectovaginal: Confirms               Anus:  normal sphincter tone, no lesions  A:  Well Woman with normal exam Elevated lipids H/O depression  P:   Mammogram due.  Appt scheduled. pap smear not indicated.  H/O TAH Lunesta 2mg  qhs prn.  #30/2 RF Vit D done today return annually or  prn  An After Visit Summary was printed and given to the patient.

## 2013-05-06 NOTE — Patient Instructions (Addendum)

## 2013-05-08 ENCOUNTER — Ambulatory Visit: Payer: PRIVATE HEALTH INSURANCE | Admitting: Obstetrics & Gynecology

## 2013-05-08 LAB — VITAMIN D 25 HYDROXY (VIT D DEFICIENCY, FRACTURES): Vit D, 25-Hydroxy: 32 ng/mL (ref 30–89)

## 2013-05-12 ENCOUNTER — Telehealth: Payer: Self-pay

## 2013-05-12 NOTE — Telephone Encounter (Signed)
Patient notified of lab results

## 2013-05-12 NOTE — Telephone Encounter (Signed)
lmtcb

## 2013-05-12 NOTE — Telephone Encounter (Signed)
Message copied by Elisha Headland on Mon May 12, 2013 10:19 AM ------      Message from: Jerene Bears      Created: Thu May 08, 2013  7:39 AM       Inform level 32 and she should take OTC vit d 1000 IU daily ------

## 2013-05-23 ENCOUNTER — Ambulatory Visit: Payer: PRIVATE HEALTH INSURANCE

## 2013-06-18 ENCOUNTER — Ambulatory Visit
Admission: RE | Admit: 2013-06-18 | Discharge: 2013-06-18 | Disposition: A | Discharge status: Home / Self Care | Payer: PRIVATE HEALTH INSURANCE | Source: Ambulatory Visit | Attending: Obstetrics & Gynecology | Admitting: Obstetrics & Gynecology

## 2013-06-18 DIAGNOSIS — Z1239 Encounter for other screening for malignant neoplasm of breast: Secondary | ICD-10-CM

## 2013-08-29 ENCOUNTER — Encounter: Payer: Self-pay | Admitting: Family Medicine

## 2013-08-29 ENCOUNTER — Ambulatory Visit (INDEPENDENT_AMBULATORY_CARE_PROVIDER_SITE_OTHER): Payer: PRIVATE HEALTH INSURANCE | Admitting: Family Medicine

## 2013-08-29 VITALS — BP 110/68 | HR 88 | Temp 98.8°F | Resp 16 | Ht 66.0 in | Wt 147.0 lb

## 2013-08-29 DIAGNOSIS — Z789 Other specified health status: Secondary | ICD-10-CM

## 2013-08-29 DIAGNOSIS — F341 Dysthymic disorder: Secondary | ICD-10-CM

## 2013-08-29 DIAGNOSIS — F418 Other specified anxiety disorders: Secondary | ICD-10-CM

## 2013-08-29 MED ORDER — ESZOPICLONE 2 MG PO TABS: 2.0000 mg | ORAL_TABLET | Freq: Every day | ORAL | Status: DC

## 2013-08-29 MED ORDER — CITALOPRAM HYDROBROMIDE 20 MG PO TABS: ORAL_TABLET | ORAL | Status: DC

## 2013-08-29 MED ORDER — CLONAZEPAM 0.5 MG PO TABS: ORAL_TABLET | ORAL | Status: DC

## 2013-08-30 NOTE — Progress Notes (Signed)
S;  This 63 y.o. Cauc female is here to discuss medication foe chronic depression w/ anxiety. She had been taking Wellbutrin since 2011 but decided to wean off this medication, feeling that she no longer needed it. She has been off the medication x 6 months. 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 Job responsibilities and other changes led to increased symptoms, so pt resigned from that position ~ 1 week ago. She is feeling less depressed. Now has a new position at the soon-to-open [employer] [employer]. 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 She wants to resume medication for anxiety/ depression but is concerned about weight gain w/ Zoloft or other SSRI. In addition to medication, she is in counseling; her therapist thinks she may have a mild case of PTSD (due to previous job situation). Pt has good social support system.  Patient Active Problem List   Diagnosis Date Noted  . HYPERLIPIDEMIA 07/21/2010  . DEPRESSION 07/21/2010  . GERD 07/21/2010  . PNEUMONIA, HX OF 07/21/2010   PMHx, Surg Hx, Soc and Fam Hx reviewed.  Medications reconciled.  ROS: Negative for loss or appetite, fatigue, CP or tightness, palpitations, SOB, abd pain or other GI problems, skin or hair changes, HA, dizziness, weakness numbness, tremor or syncope. No agitation, behavior disturbance, hyperactivity or confusion. Mild insomnia is chronic. No thoughts of self harm or SI/HI. Pt has hx of suicide attempt in Aug 2010 (medication OD).  O: Filed Vitals:   08/29/13 1034  BP: 110/68  Pulse: 88  Temp: 98.8 F (37.1 C)  Resp: 16   GEN: in NAD: WN,WD. Weight down 6 lbs. HENT: [place]/AT; EOMI w/ clear conj/sclerae. Otherwise unremarkable. NECK: SUpple w/o LAN or TMG. COR: RRR. LUNGS: Unlabored resp. SKIN: W&D; intact w/o diaphoresis, erythema or pallor. MS; MAEs; no joint  deformities or muscle atrophy. No c/c/e. NEURO" A&O x 3; CNs intact. Nonfocal. Gait normal w/ normal coordination. PSYCH: Pleasant and calm demeanor. Mildly anxious but not labile or inappropriate. Attentive w/ good eye contact. Speech pattern and thought content is normal. Judgement is sound.  A/P: Depression with anxiety- Discussed medication choices and pt is interested in trial of Celexa. She wants to start new job w/ minimal anxiety, able to concentrate/ focus; she will have to learn [employer]. She will start generic Celexa 20 mg 1/2 tablet every morning for 1-2 weeks; if she feels better , she can remain at that dose. If she still has some minor symptoms, she can increase to 20 mg daily dose.  She can use clonazepam 1/2 tablet twice daily if needed for anxiety. Sleep aid is prescribed for prn use.  History of recent change in lifestyle- Encouraged he to continued counseling and maintain positive supportive relationships w/ fellow hurses from previous job.  Pt encouraged to sign up for MyChart so that she can communicate w/ the office and w/ me. She will communicate how the medications are working for her and how she is adapting to the changes w/ life and work.   Meds ordered this encounter  Medications  . citalopram (CELEXA) 20 MG tablet    Sig: Take 1/2 - 1 tablet every morning or as directed.    Dispense:  30 tablet    Refill:  3  . eszopiclone (LUNESTA) 2 MG TABS tablet    Sig: Take 1 tablet (2 mg total) by mouth at bedtime. Take  immediately before bedtime as needed    Dispense:  30 tablet    Refill:  1  . clonazePAM (KLONOPIN) 0.5 MG tablet    Sig: Take 1/2 tablet bid prn anxiety.    Dispense:  20 tablet    Refill:  1

## 2013-11-24 ENCOUNTER — Other Ambulatory Visit: Payer: Self-pay | Admitting: Family Medicine

## 2013-11-25 NOTE — Telephone Encounter (Signed)
Eszopiclone 2mg  tablets  #15 w/ 0 RFs phoned to pt's pharmacy.

## 2013-11-27 ENCOUNTER — Ambulatory Visit (INDEPENDENT_AMBULATORY_CARE_PROVIDER_SITE_OTHER): Payer: 59 | Admitting: Family Medicine

## 2013-11-27 ENCOUNTER — Encounter: Payer: Self-pay | Admitting: Family Medicine

## 2013-11-27 VITALS — BP 108/74 | HR 83 | Temp 98.7°F | Resp 16 | Ht 66.0 in | Wt 151.2 lb

## 2013-11-27 DIAGNOSIS — K219 Gastro-esophageal reflux disease without esophagitis: Secondary | ICD-10-CM

## 2013-11-27 DIAGNOSIS — K1370 Unspecified lesions of oral mucosa: Secondary | ICD-10-CM

## 2013-11-27 DIAGNOSIS — Z1212 Encounter for screening for malignant neoplasm of rectum: Secondary | ICD-10-CM

## 2013-11-27 DIAGNOSIS — F329 Major depressive disorder, single episode, unspecified: Secondary | ICD-10-CM

## 2013-11-27 DIAGNOSIS — Z1211 Encounter for screening for malignant neoplasm of colon: Secondary | ICD-10-CM

## 2013-11-27 DIAGNOSIS — F3289 Other specified depressive episodes: Secondary | ICD-10-CM

## 2013-11-27 MED ORDER — ESZOPICLONE 2 MG PO TABS: ORAL_TABLET | ORAL | Status: DC

## 2013-11-27 MED ORDER — CITALOPRAM HYDROBROMIDE 20 MG PO TABS: ORAL_TABLET | ORAL | Status: DC

## 2013-11-27 MED ORDER — CITALOPRAM HYDROBROMIDE 10 MG PO TABS: ORAL_TABLET | ORAL | Status: DC

## 2013-11-28 NOTE — Progress Notes (Signed)
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 Citalopram 20 mg daily for depression is effective; pt would like to increase the dose. She is sleeping well and uses Clonazepam sparingly for bouts of anxiety. Sleep medication (Lunesta) is used < half the nights of the week. Pt has less fatigue, has a good appetite and no thoughts of self harm, SI/HI.  Pt discovered an painless oral lesion < 2 weeks ago; it is a clear bump inside lower lip. Lesion not present at recent dental exam. Pt is a nonsmoker; alcohol consumption is mild. Recalls a recent switch to new toothpaste; she had previously used Sensodyne. Concerned because sister has esophageal cancer. She also has chronic GERD but no upper GI evaluation.  Patient Active Problem List   Diagnosis Date Noted  . HYPERLIPIDEMIA 07/21/2010  . DEPRESSION 07/21/2010  . GERD 07/21/2010  . PNEUMONIA, HX OF 07/21/2010   PMHX, Surg Hx, Soc and Fam Hx reviewed.  MEDICATIONS reconciled.  ROS: As per HPI; negative for abnormal weight loss, fatigue, anorexia, sore throat, trouble swallowing, dysphagia, cough, CP or tightness, abd pain, n/v, change in stools, rashes or other skin changes, HA, dizziness or weakness.  O: Filed Vitals:   11/27/13 1007  BP: 108/74  Pulse: 83  Temp: 98.7 F (37.1 C)  Resp: 16   GEN: In NAD: WN,WD. HENT: [place]/AT; EOMI w/ clear conj/sclerae.EACs/ nose normal. Oral mucosa- small clear cystic NT lesion; normal mucosa otherwise. Gums/ dentition/ tongue unremarkable. NECK: Supple w/o LAN. COR: RRR. No edema. LUNGS: Normal resp rate and effort. SKIN: W&D; intact w/o diaphoresis, erythema or pallor. MS: MAEs; no c/c/e. NEURO: A&O x 3; CNs intact. Nonfocal. PSYCH: Pleasant and conversant. Calm demeanor w/ good eye contact. Speech pattern and thought content normal. Judgement is  sound.  A/P: DEPRESSION- Increase Citalopram to 30 mg per day (20 mg w/ 10 mg). Continue prn Clonazepam.  Oral mucosal lesion- Probable mucoid retention cyst. If lesion increases in size or becomes painful, she should contact her dentist for further evaluation.  GERD (gastroesophageal reflux disease) - Family hx of Barrett's Esophagitis and cancer. Plan: Ambulatory referral to Gastroenterology  Screening for colorectal cancer - Plan: Ambulatory referral to Gastroenterology  Meds ordered this encounter  Medications  . eszopiclone (LUNESTA) 2 MG TABS tablet    Sig: TAKE ONE TABLET BY MOUTH NIGHTLY IMMEDIATELY BEFORE BEDTIME AS NEEDED.    Dispense:  30 tablet    Refill:  2    May fill on or after Dec 09, 2013.  . citalopram (CELEXA) 20 MG tablet    Sig: Take 1 tablet every morning with a 10 mg tablet.    Dispense:  30 tablet    Refill:  5  . citalopram (CELEXA) 10 MG tablet    Sig: Take 1 tablet every morning with a 20 mg tablet.    Dispense:  30 tablet    Refill:  5

## 2014-01-15 ENCOUNTER — Telehealth: Payer: Self-pay

## 2014-01-15 NOTE — Telephone Encounter (Signed)
She should call the pharmacy, her insurance will probably only cover 15, she may have to purchase the remainder of the prescription. Left detailed message for her to contact the pharmacy regarding the remaining amount of the prescription.

## 2014-01-15 NOTE — Telephone Encounter (Signed)
Patient states [facility] has her script eszopiclone (LUNESTA) 2 MG TABS tablet To be dispensed 15 at a time.  She says Dr. Leward Quan changed this to 30 tabs.   [facility] on [address] to correct.   [phone]

## 2014-03-12 ENCOUNTER — Encounter: Payer: 59 | Admitting: Family Medicine

## 2014-04-14 ENCOUNTER — Ambulatory Visit (INDEPENDENT_AMBULATORY_CARE_PROVIDER_SITE_OTHER): Payer: 59 | Admitting: Family Medicine

## 2014-04-14 ENCOUNTER — Encounter: Payer: Self-pay | Admitting: Family Medicine

## 2014-04-14 VITALS — BP 119/73 | HR 87 | Temp 98.0°F | Resp 16 | Ht 66.5 in | Wt 158.0 lb

## 2014-04-14 DIAGNOSIS — Z0000 Encounter for general adult medical examination without abnormal findings: Secondary | ICD-10-CM

## 2014-04-14 DIAGNOSIS — Z23 Encounter for immunization: Secondary | ICD-10-CM

## 2014-04-14 DIAGNOSIS — Z8639 Personal history of other endocrine, nutritional and metabolic disease: Secondary | ICD-10-CM

## 2014-04-14 DIAGNOSIS — F3289 Other specified depressive episodes: Secondary | ICD-10-CM

## 2014-04-14 DIAGNOSIS — F329 Major depressive disorder, single episode, unspecified: Secondary | ICD-10-CM

## 2014-04-14 DIAGNOSIS — R5383 Other fatigue: Secondary | ICD-10-CM

## 2014-04-14 DIAGNOSIS — R5381 Other malaise: Secondary | ICD-10-CM

## 2014-04-14 DIAGNOSIS — Z862 Personal history of diseases of the blood and blood-forming organs and certain disorders involving the immune mechanism: Secondary | ICD-10-CM

## 2014-04-14 DIAGNOSIS — E785 Hyperlipidemia, unspecified: Secondary | ICD-10-CM

## 2014-04-14 LAB — CBC WITH DIFFERENTIAL/PLATELET
Basophils Absolute: 0 10*3/uL (ref 0.0–0.1)
Basophils Relative: 0 % (ref 0–1)
Eosinophils Absolute: 0.1 10*3/uL (ref 0.0–0.7)
Eosinophils Relative: 2 % (ref 0–5)
HCT: 39.7 % (ref 36.0–46.0)
Hemoglobin: 13.6 g/dL (ref 12.0–15.0)
Lymphocytes Relative: 33 % (ref 12–46)
Lymphs Abs: 2.4 10*3/uL (ref 0.7–4.0)
MCH: 31.2 pg (ref 26.0–34.0)
MCHC: 34.3 g/dL (ref 30.0–36.0)
MCV: 91.1 fL (ref 78.0–100.0)
Monocytes Absolute: 0.4 10*3/uL (ref 0.1–1.0)
Monocytes Relative: 6 % (ref 3–12)
Neutro Abs: 4.3 10*3/uL (ref 1.7–7.7)
Neutrophils Relative %: 59 % (ref 43–77)
Platelets: 340 10*3/uL (ref 150–400)
RBC: 4.36 MIL/uL (ref 3.87–5.11)
RDW: 14 % (ref 11.5–15.5)
WBC: 7.3 10*3/uL (ref 4.0–10.5)

## 2014-04-14 LAB — POCT GLYCOSYLATED HEMOGLOBIN (HGB A1C): Hemoglobin A1C: 5.5

## 2014-04-14 LAB — GLUCOSE, POCT (MANUAL RESULT ENTRY): POC Glucose: 106 mg/dl — AB (ref 70–99)

## 2014-04-14 NOTE — Progress Notes (Signed)
Subjective:    Patient ID: [name], female    DOB: 1950-10-10, 63 y.o.   MRN: [id number]  HPI  This 63 y.o. Cauc female is here for CPE and labs. She works as a Mining engineer in the [employer] and recently ended 48-60 hour weeks. She will be working 3 12-hour shifts and hopes that fatigue, anhedonia and sleep disturbance will resolve. Chronic depression has been treated in the past w/ SSRIs and other anti-depressants but pt declines to continue these meds. Lunesta helps w/ sleep when taken on a prn basis; pt does not want to use anxiolytic to help w/ sleep.  She just started a fitness program w/ a trainer, going 4 days/week. 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  Patient Active Problem List   Diagnosis Date Noted  . HYPERLIPIDEMIA 07/21/2010  . DEPRESSION 07/21/2010  . GERD 07/21/2010  . PNEUMONIA, HX OF 07/21/2010    Prior to Admission medications   Medication Sig Start Date End Date Taking? Authorizing Provider  eszopiclone (LUNESTA) 2 MG TABS tablet TAKE ONE TABLET BY MOUTH NIGHTLY IMMEDIATELY BEFORE BEDTIME AS NEEDED. 11/27/13  Yes Barton Fanny, MD  omeprazole (PRILOSEC) 10 MG capsule Take 10 mg by mouth daily.   Yes Historical Provider, MD    History   Social History  . Marital Status: Single    Spouse Name: N/A    Number of Children: N/A  . Years of Education: N/A   Occupational History  . Not on file.   Social History Main Topics  . Smoking status: Never Smoker   . Smokeless tobacco: Never Used  . Alcohol Use: 5.0 oz/week    10 drink(s) per week  . Drug Use: No  . Sexual Activity: No     Comment: TAH/BSO   Other Topics Concern  . Not on file   Social History Narrative   College Degree   Registered Nurse   Exercises          Family History  Problem Relation Age of Onset  . Hyperlipidemia Father   . Hypertension Father   . Diabetes Father   . Cancer  Father     History of bladder cancer  . Hypertension Mother   . Diabetes Mother   . Heart disease Maternal Grandmother   . Stroke Maternal Grandfather   . Hyperlipidemia Brother   . Cancer Paternal Grandfather     pancreatic cancer  . Cancer Paternal Grandmother     uterine  . Heart disease Father   . Esophageal cancer Sister     Review of Systems  Constitutional: Positive for diaphoresis and fatigue.  HENT: Negative.   Eyes: Negative.   Respiratory: Negative.   Cardiovascular: Negative.   Gastrointestinal: Negative.   Endocrine: Negative.   Genitourinary: Negative.   Musculoskeletal: Negative.   Skin: Negative.   Allergic/Immunologic: Negative.   Neurological: Negative.   Hematological: Negative.   Psychiatric/Behavioral: Positive for sleep disturbance and dysphoric mood. The patient is nervous/anxious.       Objective:   Physical Exam  Nursing note and vitals reviewed. Constitutional: She is oriented to person, place, and time. Vital signs are normal. She appears well-developed and well-nourished. No distress.  HENT:  Head: Normocephalic and atraumatic.  Right Ear: Hearing, tympanic membrane, external ear and ear canal normal.  Left Ear: Hearing, tympanic membrane, external ear and ear canal  normal.  Nose: Nose normal. No nasal deformity or septal deviation.  Mouth/Throat: Uvula is midline, oropharynx is clear and moist and mucous membranes are normal. No oral lesions. Normal dentition. No dental caries.  Eyes: Conjunctivae, EOM and lids are normal. Pupils are equal, round, and reactive to light. No scleral icterus.  Neck: Trachea normal, normal range of motion, full passive range of motion without pain and phonation normal. Neck supple. No JVD present. No spinous process tenderness and no muscular tenderness present. Carotid bruit is not present. No mass and no thyromegaly present.  Cardiovascular: Normal rate, regular rhythm, S1 normal, S2 normal, intact distal pulses  and normal pulses.   No extrasystoles are present. PMI is not displaced.  Exam reveals no gallop and no friction rub.   No murmur heard. Pulmonary/Chest: Effort normal and breath sounds normal. No respiratory distress. She has no decreased breath sounds. Right breast exhibits skin change. Right breast exhibits no inverted nipple, no mass, no nipple discharge and no tenderness. Left breast exhibits skin change. Left breast exhibits no inverted nipple, no mass, no nipple discharge and no tenderness. Breasts are symmetrical.  Breast reduction scars well healed.  Abdominal: Soft. Normal appearance and normal aorta. She exhibits no distension, no pulsatile midline mass and no mass. There is no hepatosplenomegaly. There is no tenderness. There is no guarding and no CVA tenderness.  Genitourinary:  Deferred.  Musculoskeletal:       Right wrist: She exhibits decreased range of motion and tenderness.       Cervical back: Normal.       Thoracic back: Normal.       Lumbar back: Normal.  Weakness in R wrist and hand. Remainder of exam unremarkable.  Lymphadenopathy:       Head (right side): No submental, no submandibular, no tonsillar, no preauricular, no posterior auricular and no occipital adenopathy present.       Head (left side): No submental, no submandibular, no tonsillar, no preauricular, no posterior auricular and no occipital adenopathy present.    She has no cervical adenopathy.    She has no axillary adenopathy.       Right: No inguinal and no supraclavicular adenopathy present.       Left: No inguinal and no supraclavicular adenopathy present.  Neurological: She is alert and oriented to person, place, and time. She has normal strength. She displays no atrophy and no tremor. No cranial nerve deficit or sensory deficit. She exhibits normal muscle tone. She displays a negative Romberg sign. Coordination and gait normal.  Reflex Scores:      Tricep reflexes are 2+ on the right side and 2+ on the  left side.      Bicep reflexes are 2+ on the right side and 2+ on the left side.      Brachioradialis reflexes are 2+ on the right side and 2+ on the left side.      Patellar reflexes are 2+ on the right side and 2+ on the left side. Skin: Skin is warm, dry and intact. No ecchymosis, no lesion and no rash noted. She is not diaphoretic. No cyanosis or erythema. No pallor. Nails show no clubbing.  Psychiatric: She has a normal mood and affect. Her speech is normal and behavior is normal. Judgment and thought content normal. Cognition and memory are normal.  PHQ-9 score= 9.    Results for orders placed in visit on 04/14/14  GLUCOSE, POCT (MANUAL RESULT ENTRY)      Result Value  Ref Range   POC Glucose 106 (*) 70 - 99 mg/dl  POCT GLYCOSYLATED HEMOGLOBIN (HGB A1C)      Result Value Ref Range   Hemoglobin A1C 5.5        Assessment & Plan:  Routine general medical examination at a health care facility - Plan: Thyroid Panel With TSH, COMPLETE METABOLIC PANEL WITH GFR, CBC with Differential  DEPRESSION- Discussed alternative treatments to include Fish Oil and L-methylfolate; encouraged routine exercise, nutrition and resuming hobbies and vacation (time for self-care).  HYPERLIPIDEMIA - Plan: Lipid panel  Other malaise and fatigue- Associated w/ long work hours, chronic depression and intermittent insomnia.  Need for prophylactic vaccination and inoculation against influenza - Plan: Flu Vaccine QUAD 36+ mos IM  History of hyperglycemia - Plan: POCT glucose (manual entry), POCT glycosylated hemoglobin (Hb A1C)

## 2014-04-14 NOTE — Patient Instructions (Signed)
Keeping You Healthy  Get These Tests  Blood Pressure- Have your blood pressure checked by your healthcare provider at least once a year.  Normal blood pressure is 120/80.  Weight- Have your body mass index (BMI) calculated to screen for obesity.  BMI is a measure of body fat based on height and weight.  You can calculate your own BMI at www.nhlbisupport.com/bmi/  Cholesterol- Have your cholesterol checked every year.  Diabetes- Have your blood sugar checked every year if you have high blood pressure, high cholesterol, a family history of diabetes or if you are overweight.  Pap Smear- Have a pap smear every 1 to 3 years if you have been sexually active.  If you are older than 62 and recent pap smears have been normal you may not need additional pap smears.  In addition, if you have had a hysterectomy  For benign disease additional pap smears are not necessary.  Mammogram-Yearly mammograms are essential for early detection of breast cancer  Screening for Colon Cancer- Colonoscopy starting at age 50. Screening may begin sooner depending on your family history and other health conditions.  Follow up colonoscopy as directed by your Gastroenterologist.  Screening for Osteoporosis- Screening begins at age 62 with bone density scanning, sooner if you are at higher risk for developing Osteoporosis.  Get these medicines  Calcium with Vitamin D- Your body requires 1200-1500 mg of Calcium a day and 800-1000 IU of Vitamin D a day.  You can only absorb 500 mg of Calcium at a time therefore Calcium must be taken in 2 or 3 separate doses throughout the day.  Hormones- Hormone therapy has been associated with increased risk for certain cancers and heart disease.  Talk to your healthcare provider about if you need relief from menopausal symptoms.  Aspirin- Ask your healthcare provider about taking Aspirin to prevent Heart Disease and Stroke.  Get these Immuniztions  Flu shot- Every fall  Pneumonia  shot- Once after the age of 62; if you are younger ask your healthcare provider if you need a pneumonia shot.  Tetanus- Every ten years.  Zostavax- Once after the age of 62 to prevent shingles.  Take these steps  Don't smoke- Your healthcare provider can help you quit. For tips on how to quit, ask your healthcare provider or go to www.smokefree.gov or call 1-800 QUIT-NOW.  Be physically active- Exercise 5 days a week for a minimum of 30 minutes.  If you are not already physically active, start slow and gradually work up to 30 minutes of moderate physical activity.  Try walking, dancing, bike riding, swimming, etc.  Eat a healthy diet- Eat a variety of healthy foods such as fruits, vegetables, whole grains, low fat milk, low fat cheeses, yogurt, lean meats, chicken, fish, eggs, dried beans, tofu, etc.  For more information go to www.thenutritionsource.org  Dental visit- Brush and floss teeth twice daily; visit your dentist twice a year.  Eye exam- Visit your Optometrist or Ophthalmologist yearly.  Drink alcohol in moderation- Limit alcohol intake to one drink or less a day.  Never drink and drive.  Depression- Your emotional health is as important as your physical health.  If you're feeling down or losing interest in things you normally enjoy, please talk to your healthcare provider.  Seat Belts- can save your life; always wear one  Smoke/Carbon Monoxide detectors- These detectors need to be installed on the appropriate level of your home.  Replace batteries at least once a year.  Violence- If anyone   is threatening or hurting you, please tell your healthcare provider.  Living Will/ Health care power of attorney- Discuss with your healthcare provider and family.    OTC supplements that help reduce depression include:  Omega 3 Fish Oil 2000 mg daily, L-methylfolate 5- 7.5 mg daily and aromatherapy (lavender scent) as well as meditation/ music and art therapy are some non-pharmaceutical  treatments. Regular physical activity can help solidify sleep. Having a quiet relaxing bedtime routine can make sleep more restful.

## 2014-04-15 LAB — THYROID PANEL WITH TSH
Free Thyroxine Index: 1.3 — ABNORMAL LOW (ref 1.4–3.8)
T3 Uptake: 28 % (ref 22.0–35.0)
T4, Total: 4.8 ug/dL (ref 4.5–12.0)
TSH: 1.718 u[IU]/mL (ref 0.350–4.500)

## 2014-04-15 LAB — COMPLETE METABOLIC PANEL WITH GFR
ALT: 16 U/L (ref 0–35)
AST: 27 U/L (ref 0–37)
Albumin: 4.6 g/dL (ref 3.5–5.2)
Alkaline Phosphatase: 45 U/L (ref 39–117)
BUN: 9 mg/dL (ref 6–23)
CO2: 29 mEq/L (ref 19–32)
Calcium: 9.9 mg/dL (ref 8.4–10.5)
Chloride: 102 mEq/L (ref 96–112)
Creat: 0.83 mg/dL (ref 0.50–1.10)
GFR, Est African American: 88 mL/min
GFR, Est Non African American: 76 mL/min
Glucose, Bld: 99 mg/dL (ref 70–99)
Potassium: 4.1 mEq/L (ref 3.5–5.3)
Sodium: 139 mEq/L (ref 135–145)
Total Bilirubin: 0.5 mg/dL (ref 0.2–1.2)
Total Protein: 7.2 g/dL (ref 6.0–8.3)

## 2014-04-15 LAB — LIPID PANEL
Cholesterol: 276 mg/dL — ABNORMAL HIGH (ref 0–200)
HDL: 84 mg/dL (ref >39)
LDL Cholesterol: 168 mg/dL — ABNORMAL HIGH (ref 0–99)
Total CHOL/HDL Ratio: 3.3 Ratio
Triglycerides: 119 mg/dL (ref <150)
VLDL: 24 mg/dL (ref 0–40)

## 2014-04-28 ENCOUNTER — Inpatient Hospital Stay (HOSPITAL_COMMUNITY): Payer: 59

## 2014-04-28 ENCOUNTER — Emergency Department (HOSPITAL_COMMUNITY): Payer: 59

## 2014-04-28 ENCOUNTER — Encounter (HOSPITAL_COMMUNITY): Admission: EM | Disposition: A | Discharge status: Home Health | Payer: Self-pay | Source: Home / Self Care

## 2014-04-28 ENCOUNTER — Inpatient Hospital Stay (HOSPITAL_COMMUNITY): Payer: 59 | Admitting: Certified Registered"

## 2014-04-28 ENCOUNTER — Encounter (HOSPITAL_COMMUNITY): Payer: 59 | Admitting: Certified Registered"

## 2014-04-28 ENCOUNTER — Inpatient Hospital Stay (HOSPITAL_COMMUNITY)
Admission: EM | Admit: 2014-04-28 | Discharge: 2014-05-04 | DRG: 907 | Disposition: A | Discharge status: Home Health | Payer: 59 | Attending: General Surgery | Admitting: General Surgery

## 2014-04-28 ENCOUNTER — Encounter (HOSPITAL_COMMUNITY): Payer: Self-pay | Admitting: Emergency Medicine

## 2014-04-28 DIAGNOSIS — Y9210 Unspecified residential institution as the place of occurrence of the external cause: Secondary | ICD-10-CM | POA: Diagnosis present

## 2014-04-28 DIAGNOSIS — Z79899 Other long term (current) drug therapy: Secondary | ICD-10-CM | POA: Diagnosis not present

## 2014-04-28 DIAGNOSIS — E119 Type 2 diabetes mellitus without complications: Secondary | ICD-10-CM | POA: Diagnosis present

## 2014-04-28 DIAGNOSIS — R109 Unspecified abdominal pain: Secondary | ICD-10-CM | POA: Diagnosis present

## 2014-04-28 DIAGNOSIS — Z9049 Acquired absence of other specified parts of digestive tract: Secondary | ICD-10-CM

## 2014-04-28 DIAGNOSIS — IMO0002 Reserved for concepts with insufficient information to code with codable children: Secondary | ICD-10-CM | POA: Diagnosis present

## 2014-04-28 DIAGNOSIS — K219 Gastro-esophageal reflux disease without esophagitis: Secondary | ICD-10-CM | POA: Diagnosis present

## 2014-04-28 DIAGNOSIS — K659 Peritonitis, unspecified: Secondary | ICD-10-CM | POA: Diagnosis present

## 2014-04-28 DIAGNOSIS — E785 Hyperlipidemia, unspecified: Secondary | ICD-10-CM | POA: Diagnosis present

## 2014-04-28 DIAGNOSIS — I959 Hypotension, unspecified: Secondary | ICD-10-CM | POA: Diagnosis present

## 2014-04-28 DIAGNOSIS — Z800 Family history of malignant neoplasm of digestive organs: Secondary | ICD-10-CM

## 2014-04-28 DIAGNOSIS — K631 Perforation of intestine (nontraumatic): Secondary | ICD-10-CM

## 2014-04-28 HISTORY — PX: COLOSTOMY: SHX63

## 2014-04-28 HISTORY — PX: LAPAROTOMY: SHX154

## 2014-04-28 HISTORY — PX: APPLICATION OF WOUND VAC: SHX5189

## 2014-04-28 HISTORY — DX: Perforation of intestine (nontraumatic): K63.1

## 2014-04-28 HISTORY — PX: COLOSTOMY REVISION: SHX5232

## 2014-04-28 LAB — COMPREHENSIVE METABOLIC PANEL
ALT: 19 U/L (ref 0–35)
AST: 60 U/L — ABNORMAL HIGH (ref 0–37)
Albumin: 3.9 g/dL (ref 3.5–5.2)
Alkaline Phosphatase: 35 U/L — ABNORMAL LOW (ref 39–117)
Anion gap: 14 (ref 5–15)
BUN: 6 mg/dL (ref 6–23)
CO2: 18 mEq/L — ABNORMAL LOW (ref 19–32)
Calcium: 9.2 mg/dL (ref 8.4–10.5)
Chloride: 100 mEq/L (ref 96–112)
Creatinine, Ser: 0.57 mg/dL (ref 0.50–1.10)
GFR calc Af Amer: >90 mL/min (ref >90)
GFR calc non Af Amer: >90 mL/min (ref >90)
Glucose, Bld: 106 mg/dL — ABNORMAL HIGH (ref 70–99)
Potassium: >7.7 mEq/L (ref 3.7–5.3)
Sodium: 132 mEq/L — ABNORMAL LOW (ref 137–147)
Total Bilirubin: 0.3 mg/dL (ref 0.3–1.2)
Total Protein: 7.4 g/dL (ref 6.0–8.3)

## 2014-04-28 LAB — POTASSIUM: Potassium: 3.6 mEq/L — ABNORMAL LOW (ref 3.7–5.3)

## 2014-04-28 LAB — CBC WITH DIFFERENTIAL/PLATELET
Basophils Absolute: 0 10*3/uL (ref 0.0–0.1)
Basophils Relative: 0 % (ref 0–1)
Eosinophils Absolute: 0.1 10*3/uL (ref 0.0–0.7)
Eosinophils Relative: 1 % (ref 0–5)
HCT: 41.7 % (ref 36.0–46.0)
Hemoglobin: 14 g/dL (ref 12.0–15.0)
Lymphocytes Relative: 19 % (ref 12–46)
Lymphs Abs: 1.7 10*3/uL (ref 0.7–4.0)
MCH: 31.6 pg (ref 26.0–34.0)
MCHC: 33.6 g/dL (ref 30.0–36.0)
MCV: 94.1 fL (ref 78.0–100.0)
Monocytes Absolute: 0.4 10*3/uL (ref 0.1–1.0)
Monocytes Relative: 5 % (ref 3–12)
Neutro Abs: 6.8 10*3/uL (ref 1.7–7.7)
Neutrophils Relative %: 75 % (ref 43–77)
Platelets: 266 10*3/uL (ref 150–400)
RBC: 4.43 MIL/uL (ref 3.87–5.11)
RDW: 13.8 % (ref 11.5–15.5)
WBC: 9.1 10*3/uL (ref 4.0–10.5)

## 2014-04-28 LAB — ABO/RH: ABO/RH(D): A POS

## 2014-04-28 LAB — PROTIME-INR
INR: 0.91 (ref 0.00–1.49)
Prothrombin Time: 12.3 seconds (ref 11.6–15.2)

## 2014-04-28 LAB — TYPE AND SCREEN
ABO/RH(D): A POS
Antibody Screen: NEGATIVE

## 2014-04-28 LAB — GLUCOSE, CAPILLARY: Glucose-Capillary: 181 mg/dL — ABNORMAL HIGH (ref 70–99)

## 2014-04-28 SURGERY — LAPAROTOMY, EXPLORATORY
Anesthesia: General | Site: Abdomen

## 2014-04-28 MED ORDER — DIPHENHYDRAMINE HCL 12.5 MG/5ML PO ELIX: 12.5000 mg | ORAL_SOLUTION | Freq: Four times a day (QID) | ORAL | Status: DC | PRN

## 2014-04-28 MED ORDER — ACETAMINOPHEN 325 MG PO TABS: 650.0000 mg | ORAL_TABLET | Freq: Four times a day (QID) | ORAL | Status: DC | PRN

## 2014-04-28 MED ORDER — SUCCINYLCHOLINE CHLORIDE 20 MG/ML IJ SOLN
INTRAMUSCULAR | Status: DC | PRN
  Administered 2014-04-28: 60 mg via INTRAVENOUS

## 2014-04-28 MED ORDER — LACTATED RINGERS IV SOLN
INTRAVENOUS | Status: DC | PRN
  Administered 2014-04-28 (×2): via INTRAVENOUS

## 2014-04-28 MED ORDER — ENOXAPARIN SODIUM 40 MG/0.4ML ~~LOC~~ SOLN
40.0000 mg | SUBCUTANEOUS | Status: DC
  Administered 2014-04-29 – 2014-05-04 (×6): 40 mg via SUBCUTANEOUS
  Filled 2014-04-28 (×8): qty 0.4

## 2014-04-28 MED ORDER — GLYCOPYRROLATE 0.2 MG/ML IJ SOLN
INTRAMUSCULAR | Status: AC
  Filled 2014-04-28: qty 2

## 2014-04-28 MED ORDER — ACETAMINOPHEN 160 MG/5ML PO SOLN
325.0000 mg | ORAL | Status: DC | PRN
  Filled 2014-04-28: qty 20.3

## 2014-04-28 MED ORDER — DEXTROSE 5 % IV SOLN: 2.0000 g | INTRAVENOUS | Status: DC

## 2014-04-28 MED ORDER — ENOXAPARIN SODIUM 40 MG/0.4ML ~~LOC~~ SOLN
40.0000 mg | Freq: Every day | SUBCUTANEOUS | Status: DC
  Filled 2014-04-28: qty 0.4

## 2014-04-28 MED ORDER — PANTOPRAZOLE SODIUM 40 MG IV SOLR: 40.0000 mg | Freq: Every day | INTRAVENOUS | Status: DC

## 2014-04-28 MED ORDER — SODIUM CHLORIDE 0.9 % IJ SOLN
INTRAMUSCULAR | Status: AC
Start: 2014-04-28 — End: 2014-04-28
  Filled 2014-04-28: qty 10

## 2014-04-28 MED ORDER — SODIUM CHLORIDE 0.9 % IV BOLUS (SEPSIS)
1000.0000 mL | Freq: Once | INTRAVENOUS | Status: AC
  Administered 2014-04-28: 1000 mL via INTRAVENOUS

## 2014-04-28 MED ORDER — ONDANSETRON HCL 4 MG/2ML IJ SOLN
INTRAMUSCULAR | Status: AC
  Filled 2014-04-28: qty 2

## 2014-04-28 MED ORDER — METRONIDAZOLE IN NACL 5-0.79 MG/ML-% IV SOLN
500.0000 mg | Freq: Once | INTRAVENOUS | Status: AC
  Administered 2014-04-28: 500 mg via INTRAVENOUS
  Filled 2014-04-28: qty 100

## 2014-04-28 MED ORDER — HYDROMORPHONE HCL 1 MG/ML IJ SOLN
INTRAMUSCULAR | Status: AC
  Filled 2014-04-28: qty 2

## 2014-04-28 MED ORDER — PHENYLEPHRINE 40 MCG/ML (10ML) SYRINGE FOR IV PUSH (FOR BLOOD PRESSURE SUPPORT)
PREFILLED_SYRINGE | INTRAVENOUS | Status: AC
  Filled 2014-04-28: qty 10

## 2014-04-28 MED ORDER — HYDROMORPHONE HCL 1 MG/ML IJ SOLN
0.2500 mg | INTRAMUSCULAR | Status: DC | PRN
  Administered 2014-04-28 (×4): 0.5 mg via INTRAVENOUS

## 2014-04-28 MED ORDER — ROCURONIUM BROMIDE 50 MG/5ML IV SOLN
INTRAVENOUS | Status: AC
  Filled 2014-04-28: qty 1

## 2014-04-28 MED ORDER — ONDANSETRON HCL 4 MG/2ML IJ SOLN
4.0000 mg | INTRAMUSCULAR | Status: DC | PRN
  Administered 2014-04-28: 4 mg via INTRAVENOUS
  Filled 2014-04-28: qty 2

## 2014-04-28 MED ORDER — SUFENTANIL CITRATE 50 MCG/ML IV SOLN
INTRAVENOUS | Status: AC
  Filled 2014-04-28: qty 1

## 2014-04-28 MED ORDER — MORPHINE SULFATE (PF) 1 MG/ML IV SOLN
INTRAVENOUS | Status: AC
  Filled 2014-04-28: qty 25

## 2014-04-28 MED ORDER — OXYCODONE HCL 5 MG PO TABS: 5.0000 mg | ORAL_TABLET | Freq: Once | ORAL | Status: AC | PRN

## 2014-04-28 MED ORDER — PROPOFOL 10 MG/ML IV BOLUS
INTRAVENOUS | Status: AC
  Filled 2014-04-28: qty 20

## 2014-04-28 MED ORDER — DIPHENHYDRAMINE HCL 50 MG/ML IJ SOLN
12.5000 mg | Freq: Four times a day (QID) | INTRAMUSCULAR | Status: DC | PRN
  Filled 2014-04-28: qty 0.25

## 2014-04-28 MED ORDER — LIDOCAINE HCL (CARDIAC) 20 MG/ML IV SOLN
INTRAVENOUS | Status: DC | PRN
  Administered 2014-04-28: 60 mg via INTRAVENOUS

## 2014-04-28 MED ORDER — DIPHENHYDRAMINE HCL 50 MG/ML IJ SOLN: 12.5000 mg | Freq: Four times a day (QID) | INTRAMUSCULAR | Status: DC | PRN

## 2014-04-28 MED ORDER — ONDANSETRON HCL 4 MG/2ML IJ SOLN: 4.0000 mg | Freq: Four times a day (QID) | INTRAMUSCULAR | Status: DC | PRN

## 2014-04-28 MED ORDER — PANTOPRAZOLE SODIUM 40 MG IV SOLR
40.0000 mg | Freq: Every day | INTRAVENOUS | Status: DC
Start: 2014-04-28 — End: 2014-05-02
  Administered 2014-04-28 – 2014-05-01 (×4): 40 mg via INTRAVENOUS
  Filled 2014-04-28 (×6): qty 40

## 2014-04-28 MED ORDER — NEOSTIGMINE METHYLSULFATE 10 MG/10ML IV SOLN
INTRAVENOUS | Status: DC | PRN
Start: 2014-04-28 — End: 2014-04-28
  Administered 2014-04-28: 3 mg via INTRAVENOUS

## 2014-04-28 MED ORDER — FENTANYL CITRATE 0.05 MG/ML IJ SOLN
50.0000 ug | Freq: Once | INTRAMUSCULAR | Status: AC
  Administered 2014-04-28: 50 ug via INTRAVENOUS
  Filled 2014-04-28: qty 2

## 2014-04-28 MED ORDER — PROMETHAZINE HCL 25 MG/ML IJ SOLN
6.2500 mg | INTRAMUSCULAR | Status: AC | PRN
  Administered 2014-05-02 – 2014-05-03 (×2): 12.5 mg via INTRAVENOUS
  Filled 2014-04-28 (×2): qty 1

## 2014-04-28 MED ORDER — CEFTRIAXONE SODIUM 2 G IJ SOLR
2.0000 g | Freq: Once | INTRAMUSCULAR | Status: AC
  Administered 2014-04-28: 2 g via INTRAVENOUS
  Filled 2014-04-28: qty 2

## 2014-04-28 MED ORDER — LIDOCAINE HCL (CARDIAC) 20 MG/ML IV SOLN
INTRAVENOUS | Status: AC
  Filled 2014-04-28: qty 5

## 2014-04-28 MED ORDER — OXYCODONE HCL 5 MG/5ML PO SOLN: 5.0000 mg | Freq: Once | ORAL | Status: AC | PRN

## 2014-04-28 MED ORDER — ROCURONIUM BROMIDE 100 MG/10ML IV SOLN
INTRAVENOUS | Status: DC | PRN
  Administered 2014-04-28: 20 mg via INTRAVENOUS
  Administered 2014-04-28: 30 mg via INTRAVENOUS

## 2014-04-28 MED ORDER — ONDANSETRON HCL 4 MG/2ML IJ SOLN
INTRAMUSCULAR | Status: DC | PRN
  Administered 2014-04-28: 4 mg via INTRAVENOUS

## 2014-04-28 MED ORDER — HYDROMORPHONE HCL 1 MG/ML IJ SOLN: 0.5000 mg | Freq: Once | INTRAMUSCULAR | Status: DC

## 2014-04-28 MED ORDER — FENTANYL CITRATE 0.05 MG/ML IJ SOLN
50.0000 ug | INTRAMUSCULAR | Status: DC | PRN
  Administered 2014-04-28 (×4): 50 ug via INTRAVENOUS
  Filled 2014-04-28 (×4): qty 2

## 2014-04-28 MED ORDER — DIPHENHYDRAMINE HCL 12.5 MG/5ML PO ELIX
12.5000 mg | ORAL_SOLUTION | Freq: Four times a day (QID) | ORAL | Status: DC | PRN
  Filled 2014-04-28: qty 5

## 2014-04-28 MED ORDER — ACETAMINOPHEN 650 MG RE SUPP
650.0000 mg | Freq: Four times a day (QID) | RECTAL | Status: DC | PRN
  Administered 2014-04-30: 650 mg via RECTAL
  Filled 2014-04-28: qty 1

## 2014-04-28 MED ORDER — GLYCOPYRROLATE 0.2 MG/ML IJ SOLN
INTRAMUSCULAR | Status: DC | PRN
  Administered 2014-04-28: 0.4 mg via INTRAVENOUS

## 2014-04-28 MED ORDER — SUCCINYLCHOLINE CHLORIDE 20 MG/ML IJ SOLN
INTRAMUSCULAR | Status: AC
Start: 2014-04-28 — End: 2014-04-28
  Filled 2014-04-28: qty 1

## 2014-04-28 MED ORDER — 0.9 % SODIUM CHLORIDE (POUR BTL) OPTIME
TOPICAL | Status: DC | PRN
  Administered 2014-04-28: 4000 mL

## 2014-04-28 MED ORDER — METRONIDAZOLE IN NACL 5-0.79 MG/ML-% IV SOLN
500.0000 mg | Freq: Three times a day (TID) | INTRAVENOUS | Status: DC
  Administered 2014-04-28 – 2014-05-02 (×11): 500 mg via INTRAVENOUS
  Filled 2014-04-28 (×15): qty 100

## 2014-04-28 MED ORDER — MIDAZOLAM HCL 2 MG/2ML IJ SOLN
INTRAMUSCULAR | Status: AC
  Filled 2014-04-28: qty 2

## 2014-04-28 MED ORDER — SUFENTANIL CITRATE 50 MCG/ML IV SOLN
INTRAVENOUS | Status: DC | PRN
  Administered 2014-04-28: 10 ug via INTRAVENOUS
  Administered 2014-04-28: 5 ug via INTRAVENOUS
  Administered 2014-04-28 (×2): 10 ug via INTRAVENOUS
  Administered 2014-04-28: 5 ug via INTRAVENOUS
  Administered 2014-04-28: 10 ug via INTRAVENOUS

## 2014-04-28 MED ORDER — SODIUM CHLORIDE 0.9 % IJ SOLN: 9.0000 mL | INTRAMUSCULAR | Status: DC | PRN

## 2014-04-28 MED ORDER — ACETAMINOPHEN 650 MG RE SUPP: 650.0000 mg | Freq: Four times a day (QID) | RECTAL | Status: DC | PRN

## 2014-04-28 MED ORDER — ONDANSETRON HCL 4 MG/2ML IJ SOLN
4.0000 mg | Freq: Four times a day (QID) | INTRAMUSCULAR | Status: DC | PRN
  Filled 2014-04-28: qty 2

## 2014-04-28 MED ORDER — MORPHINE SULFATE (PF) 1 MG/ML IV SOLN
INTRAVENOUS | Status: DC
  Administered 2014-04-28: 22:00:00 via INTRAVENOUS
  Administered 2014-04-29: 9 mg via INTRAVENOUS
  Administered 2014-04-29: 3 mg via INTRAVENOUS
  Administered 2014-04-29: 05:00:00 via INTRAVENOUS
  Administered 2014-04-29: 12 mg via INTRAVENOUS
  Administered 2014-04-29: 15.83 mg via INTRAVENOUS
  Administered 2014-04-29: 7.06 mg via INTRAVENOUS
  Administered 2014-04-29: 10.5 mg via INTRAVENOUS
  Administered 2014-04-30: 6 mg via INTRAVENOUS
  Administered 2014-04-30 (×2): 3 mg via INTRAVENOUS
  Administered 2014-04-30: 7.46 mg via INTRAVENOUS
  Administered 2014-04-30: 1.5 mg via INTRAVENOUS
  Administered 2014-04-30: 10:00:00 via INTRAVENOUS
  Filled 2014-04-28 (×4): qty 25

## 2014-04-28 MED ORDER — CEFTRIAXONE SODIUM 1 G IJ SOLR
1.0000 g | INTRAMUSCULAR | Status: DC
  Administered 2014-04-29 – 2014-05-01 (×3): 1 g via INTRAVENOUS
  Filled 2014-04-28 (×4): qty 10

## 2014-04-28 MED ORDER — SODIUM CHLORIDE 0.9 % IV SOLN
INTRAVENOUS | Status: DC
  Administered 2014-04-28 – 2014-05-02 (×7): via INTRAVENOUS

## 2014-04-28 MED ORDER — NALOXONE HCL 0.4 MG/ML IJ SOLN
0.4000 mg | INTRAMUSCULAR | Status: DC | PRN
  Filled 2014-04-28: qty 1

## 2014-04-28 MED ORDER — PROPOFOL 10 MG/ML IV BOLUS
INTRAVENOUS | Status: DC | PRN
  Administered 2014-04-28: 140 mg via INTRAVENOUS

## 2014-04-28 MED ORDER — SODIUM CHLORIDE 0.9 % IV BOLUS (SEPSIS): 500.0000 mL | Freq: Once | INTRAVENOUS | Status: DC

## 2014-04-28 MED ORDER — PHENYLEPHRINE HCL 10 MG/ML IJ SOLN
INTRAMUSCULAR | Status: DC | PRN
  Administered 2014-04-28 (×4): 80 ug via INTRAVENOUS

## 2014-04-28 MED ORDER — KCL IN DEXTROSE-NACL 20-5-0.45 MEQ/L-%-% IV SOLN: INTRAVENOUS | Status: DC

## 2014-04-28 MED ORDER — ACETAMINOPHEN 325 MG PO TABS: 325.0000 mg | ORAL_TABLET | ORAL | Status: DC | PRN

## 2014-04-28 MED ORDER — METRONIDAZOLE IN NACL 5-0.79 MG/ML-% IV SOLN: 500.0000 mg | Freq: Three times a day (TID) | INTRAVENOUS | Status: DC

## 2014-04-28 MED ORDER — NEOSTIGMINE METHYLSULFATE 10 MG/10ML IV SOLN
INTRAVENOUS | Status: AC
  Filled 2014-04-28: qty 1

## 2014-04-28 MED ORDER — MIDAZOLAM HCL 5 MG/5ML IJ SOLN
INTRAMUSCULAR | Status: DC | PRN
  Administered 2014-04-28: 2 mg via INTRAVENOUS

## 2014-04-28 SURGICAL SUPPLY — 61 items
BAG COLOSTOMY/ILEOSTOMY 1 3/4 (OSTOMY) ×2 IMPLANT
BLADE SURG ROTATE 9660 (MISCELLANEOUS) IMPLANT
CANISTER SUCTION 2500CC (MISCELLANEOUS) ×4 IMPLANT
CHLORAPREP W/TINT 26ML (MISCELLANEOUS) ×3 IMPLANT
COVER MAYO STAND STRL (DRAPES) ×1 IMPLANT
COVER SURGICAL LIGHT HANDLE (MISCELLANEOUS) ×3 IMPLANT
DRAPE LAPAROSCOPIC ABDOMINAL (DRAPES) ×3 IMPLANT
DRAPE PROXIMA HALF (DRAPES) ×2 IMPLANT
DRAPE UTILITY 15X26 W/TAPE STR (DRAPE) ×4 IMPLANT
DRAPE WARM FLUID 44X44 (DRAPE) ×3 IMPLANT
DRSG OPSITE POSTOP 4X10 (GAUZE/BANDAGES/DRESSINGS) IMPLANT
DRSG OPSITE POSTOP 4X8 (GAUZE/BANDAGES/DRESSINGS) IMPLANT
DRSG VAC ATS MED SENSATRAC (GAUZE/BANDAGES/DRESSINGS) ×2 IMPLANT
ELECT BLADE 4.0 EZ CLEAN MEGAD (MISCELLANEOUS) ×3
ELECT BLADE 6.5 EXT (BLADE) IMPLANT
ELECT CAUTERY BLADE 6.4 (BLADE) ×4 IMPLANT
ELECT REM PT RETURN 9FT ADLT (ELECTROSURGICAL) ×3
ELECTRODE BLDE 4.0 EZ CLN MEGD (MISCELLANEOUS) IMPLANT
ELECTRODE REM PT RTRN 9FT ADLT (ELECTROSURGICAL) ×2 IMPLANT
GLOVE BIO SURGEON STRL SZ 6.5 (GLOVE) ×6 IMPLANT
GLOVE BIO SURGEON STRL SZ7 (GLOVE) ×3 IMPLANT
GLOVE BIOGEL PI IND STRL 6.5 (GLOVE) ×3 IMPLANT
GLOVE BIOGEL PI IND STRL 7.5 (GLOVE) ×2 IMPLANT
GLOVE BIOGEL PI IND STRL 8 (GLOVE) IMPLANT
GLOVE BIOGEL PI INDICATOR 6.5 (GLOVE) ×3
GLOVE BIOGEL PI INDICATOR 7.5 (GLOVE) ×1
GLOVE BIOGEL PI INDICATOR 8 (GLOVE) ×1
GLOVE BIOGEL PI ORTHO PRO SZ7 (GLOVE) ×1
GLOVE PI ORTHO PRO STRL SZ7 (GLOVE) IMPLANT
GLOVE SS BIOGEL STRL SZ 7.5 (GLOVE) IMPLANT
GLOVE SUPERSENSE BIOGEL SZ 7.5 (GLOVE) ×1
GOWN STRL REUS W/ TWL LRG LVL3 (GOWN DISPOSABLE) ×6 IMPLANT
GOWN STRL REUS W/TWL LRG LVL3 (GOWN DISPOSABLE) ×9
KIT BASIN OR (CUSTOM PROCEDURE TRAY) ×3 IMPLANT
KIT ROOM TURNOVER OR (KITS) ×3 IMPLANT
LIGASURE IMPACT 36 18CM CVD LR (INSTRUMENTS) IMPLANT
NS IRRIG 1000ML POUR BTL (IV SOLUTION) ×8 IMPLANT
PACK GENERAL/GYN (CUSTOM PROCEDURE TRAY) ×3 IMPLANT
PAD ARMBOARD 7.5X6 YLW CONV (MISCELLANEOUS) ×3 IMPLANT
PENCIL BUTTON HOLSTER BLD 10FT (ELECTRODE) ×2 IMPLANT
SET COLLECT BLD 21X3/4 12 PB (MISCELLANEOUS) ×1 IMPLANT
SPECIMEN JAR LARGE (MISCELLANEOUS) ×1 IMPLANT
SPONGE LAP 18X18 X RAY DECT (DISPOSABLE) ×2 IMPLANT
STAPLER CUT CVD 40MM BLUE (STAPLE) ×1 IMPLANT
STAPLER PROXIMATE 75MM BLUE (STAPLE) ×1 IMPLANT
STAPLER VISISTAT 35W (STAPLE) ×3 IMPLANT
SUCTION POOLE TIP (SUCTIONS) ×4 IMPLANT
SUT PDS AB 1 TP1 96 (SUTURE) ×6 IMPLANT
SUT PROLENE 2 0 SH 30 (SUTURE) ×2 IMPLANT
SUT SILK 2 0 (SUTURE) ×3
SUT SILK 2 0 SH CR/8 (SUTURE) ×3 IMPLANT
SUT SILK 2-0 18XBRD TIE 12 (SUTURE) ×2 IMPLANT
SUT SILK 3 0 (SUTURE) ×6
SUT SILK 3 0 SH CR/8 (SUTURE) ×3 IMPLANT
SUT SILK 3-0 18XBRD TIE 12 (SUTURE) ×2 IMPLANT
SUT VIC AB 3-0 SH 27 (SUTURE) ×3
SUT VIC AB 3-0 SH 27X BRD (SUTURE) IMPLANT
TOWEL OR 17X26 10 PK STRL BLUE (TOWEL DISPOSABLE) ×3 IMPLANT
TRAY FOLEY CATH 16FRSI W/METER (SET/KITS/TRAYS/PACK) ×2 IMPLANT
TUBE CONNECTING 12X1/4 (SUCTIONS) ×2 IMPLANT
YANKAUER SUCT BULB TIP NO VENT (SUCTIONS) ×1 IMPLANT

## 2014-04-28 NOTE — Op Note (Signed)
Preoperative diagnosis: Peritonitis with pneumoperitoneum Postoperative diagnosis: Perforated sigmoid colon status post colonoscopy Procedure: #1 sigmoid colectomy #2 end descending colostomy #3 negative pressure wound dressing application, [id number] cm Surgeon: Dr. Serita Grammes Asst: Dr Adonis Housekeeper EBL: minimal Drains: none Anesthesia: general Complications: none Disposition: to recovery stable Sponge and needle count correct times two  Indication: This is a healthy 63 year old female who was undergoing a screening colonoscopy today with a concern of a perforation after a difficult to traverse sigmoid colon. She was brought to our emergency room. She was noted to have progressive diffuse peritonitis. She underwent plain films did not show a pneumoperitoneum. She underwent a CT scan which shows a large amount of retroperitoneal air as well as some intraperitoneal air and fluid in her pelvis with the appearance of a perforation of her distal sigmoid colon. She began to be tachycardic and had increasing abdominal pain. Due to the CT findings as well as her exam I discussed with her going to the operating room for exploration. We discussed all of the options.  Procedure: After informed consent was obtained the patient was taken to the operating room. She received ceftriaxone and Flagyl. Sequential compression devices were on her legs. She was then placed under general anesthesia without complication. A Foley catheter and nasogastric tube were placed. Her abdomen was prepped and draped in the standard sterile surgical fashion. A surgical timeout was then performed.  I made a midline incision. I carried this into the peritoneum without difficulty. She had some adhesions from her omentum to her abdominal wall which were taken down with the LigaSure device.I then mobilized the left colon and sigmoid colon by taking down the white line of Toldt.  She had large amount of retroperitoneal air.  She had a  long serosal tear in the sigmoid colon and at the upper portion of the rectum she had a perforation with leakage of fluid into her pelvis.  There was some necrotic fat in this region and small bowel adherent with some exudate present.  I elected to perform a colectomy with a colostomy due to contamination and due to the serosal tear and the appearance of the bowel as well as its location in distal sigmoid/upper rectum. I think an anastomosis even though she was prepped with all of this would be high risk.  I then used the Contour staple to come across the distal sigmoid colon.  I then used the ligasure device and silk ligatures to divide the mesentery staying clear of the ureter.  I then divided the left colon proximal to this.  I mobilized the splenic flexure during this to make sure there were no other issues as there was a lot of retroperitoneal air on the left when I rotated the colon. There was no mass palpated in the entire colon and no small bowel or liver abnormality.  I then placed a stitch proximal on the specimen. I placed 2 prolene sutures at the rectum.  I irrigated copiously until this was clear.  I then picked a location in the left rectus and brought the colon through this.  I then closed the fascia with #1 PDS. I eventually placed a vac over this.  I then matured the colostomy with 3-0 vicryl suture.  She tolerated this well, was extubated and was transferred to pacu stable.

## 2014-04-28 NOTE — ED Notes (Signed)
Received call, OR ready for pt.

## 2014-04-28 NOTE — ED Provider Notes (Signed)
Pt met by surgery on her arrival to ED.  Pt alert, c/o mid/diffuse abd pain. Ivf, pain rx. H and P and admission per general surgery.    Mirna Mires, MD 04/28/14 [phone]

## 2014-04-28 NOTE — Anesthesia Procedure Notes (Addendum)
Performed by: Claris Che   Procedure Name: Intubation Date/Time: 04/28/2014 7:30 PM Performed by: Claris Che Pre-anesthesia Checklist: Patient identified, Emergency Drugs available, Suction available, Patient being monitored and Timeout performed Patient Re-evaluated:Patient Re-evaluated prior to inductionOxygen Delivery Method: Circle system utilized Preoxygenation: Pre-oxygenation with 100% oxygen Intubation Type: IV induction, Rapid sequence and Cricoid Pressure applied Ventilation: Mask ventilation without difficulty Laryngoscope Size: Mac and 3 Grade View: Grade III Tube type: Oral Tube size: 8.0 mm Number of attempts: 1 Airway Equipment and Method: Stylet Placement Confirmation: ETT inserted through vocal cords under direct vision,  positive ETCO2 and breath sounds checked- equal and bilateral Secured at: 23 cm Tube secured with: Tape Dental Injury: Teeth and Oropharynx as per pre-operative assessment

## 2014-04-28 NOTE — ED Notes (Signed)
Xray contacted regarding Delay.

## 2014-04-28 NOTE — ED Provider Notes (Signed)
MSE was initiated and I personally evaluated the patient and placed orders (if any) at  3:33 PM on April 28, 2014.  The patient appears alert and current blood pressure is 98/56, Dr. Georgette Dover is at bedside and plan is to take her to the OR.    Britt Bottom, NP 04/28/14 1539

## 2014-04-28 NOTE — ED Notes (Signed)
Pt presents via [facility] from [facility] where she was having a colonoscopy/endoscopy this morning.  Per EMS surgical center perfororated a mucosal membrane while inflating air into pt's bowels.  Procedure was stopped at that time and patient was transported to [facility].  Pt reports bilateral LQ abdominal pain, denies noticing any bleeding. Pt received 566mL LR PTA. EMS notes that upon patient standing, BP drops to 90/50. Dr. Molli Posey at bedside.

## 2014-04-28 NOTE — Anesthesia Preprocedure Evaluation (Addendum)
Anesthesia Evaluation  Patient identified by MRN, date of birth, ID band Patient awake    Reviewed: Allergy & Precautions, H&P , NPO status , Patient's Chart, lab work & pertinent test results  History of Anesthesia Complications (+) PONV and history of anesthetic complications  Airway Mallampati: III  Neck ROM: full    Dental no notable dental hx. (+) Teeth Intact   Pulmonary neg pulmonary ROS,  breath sounds clear to auscultation        Cardiovascular negative cardio ROS  Rhythm:Regular     Neuro/Psych PSYCHIATRIC DISORDERS Anxiety Depression negative neurological ROS     GI/Hepatic Neg liver ROS, Bowel prep,GERD-  ,Colonoscopy this AM  with perferation   Endo/Other  negative endocrine ROSdiabetes  Renal/GU negative Renal ROS  negative genitourinary   Musculoskeletal   Abdominal   Peds  Hematology negative hematology ROS (+)   Anesthesia Other Findings   Reproductive/Obstetrics negative OB ROS                         Anesthesia Physical Anesthesia Plan  ASA: II and emergent  Anesthesia Plan: General ETT, Rapid Sequence and Cricoid Pressure   Post-op Pain Management:    Induction: Intravenous  Airway Management Planned: Oral ETT  Additional Equipment: None  Intra-op Plan:   Post-operative Plan: Extubation in OR and Possible Post-op intubation/ventilation  Informed Consent: I have reviewed the patients History and Physical, chart, labs and discussed the procedure including the risks, benefits and alternatives for the proposed anesthesia with the patient or authorized representative who has indicated his/her understanding and acceptance.   Dental Advisory Given  Plan Discussed with: Surgeon and CRNA  Anesthesia Plan Comments:        Anesthesia Quick Evaluation

## 2014-04-28 NOTE — ED Notes (Signed)
Consent for procedure and blood completed.

## 2014-04-28 NOTE — H&P (Addendum)
[name] is an 63 y.o. female.   Chief Complaint: Abdominal pain after colonoscopy HPI: 63 yo female presents after attempted screening colonoscopy earlier today by Dr. Earlie Raveling.  Apparently, he was negotiating a turn from the sigmoid colon to the descending colon when there appeared to be a tear in the mucosa.  He aborted the procedure and moved her to the recovery area.  Plain films showed no obvious free air, but the patient developed some abdominal pain.  She was then referred to the ED.  General Surgery was notified while the patient was enroute.  She has been mildly hypotensive enroute.  She was also supposed to have an EGD for chronic reflux and a family history of esophageal cancer.  Past Medical History  Diagnosis Date  . Depression   . GERD (gastroesophageal reflux disease)   . Hyperlipidemia   . Diabetes mellitus without complication   . Anxiety   . Vitamin D deficiency   . Suicide attempt     Past Surgical History  Procedure Laterality Date  . Abdominal hysterectomy  1999  . Elbow surgery  2007  . Ulnar tunnel release  2010  . Blepharoplasty  2010  . Cosmetic surgery    . Eye surgery    . Debridement tennis elbow    . Breast surgery  2001    Breast reduction  . Dilation and curettage of uterus      Family History  Problem Relation Age of Onset  . Hyperlipidemia Father   . Hypertension Father   . Diabetes Father   . Cancer Father     History of bladder cancer  . Hypertension Mother   . Diabetes Mother   . Heart disease Maternal Grandmother   . Stroke Maternal Grandfather   . Hyperlipidemia Brother   . Cancer Paternal Grandfather     pancreatic cancer  . Cancer Paternal Grandmother     uterine  . Heart disease Father   . Esophageal cancer Sister    Social History:  reports that she has never smoked. She has never used smokeless tobacco. She reports that she drinks about 5 ounces of alcohol per week. She reports that she does not use illicit  drugs.  Allergies:  Allergies  Allergen Reactions  . Bee Venom     Yellow jackets  . Statins Other (See Comments)    Muscle pains    Prior to Admission medications   Medication Sig Start Date End Date Taking? Authorizing Provider  eszopiclone (LUNESTA) 2 MG TABS tablet TAKE ONE TABLET BY MOUTH NIGHTLY IMMEDIATELY BEFORE BEDTIME AS NEEDED. 11/27/13   Barton Fanny, MD  omeprazole (PRILOSEC) 10 MG capsule Take 10 mg by mouth daily.    Historical Provider, MD      Review of Systems  Constitutional: Negative for weight loss.  HENT: Negative for ear discharge, ear pain, hearing loss and tinnitus.   Eyes: Negative for blurred vision, double vision, photophobia and pain.  Respiratory: Negative for cough, sputum production and shortness of breath.   Cardiovascular: Negative for chest pain.  Gastrointestinal: Positive for abdominal pain. Negative for nausea and vomiting.  Genitourinary: Negative for dysuria, urgency, frequency and flank pain.  Musculoskeletal: Negative for back pain, falls, joint pain, myalgias and neck pain.  Neurological: Negative for dizziness, tingling, sensory change, focal weakness, loss of consciousness and headaches.  Endo/Heme/Allergies: Does not bruise/bleed easily.  Psychiatric/Behavioral: Negative for depression, memory loss and substance abuse. The patient is not nervous/anxious.  Blood pressure 98/40, pulse 77, temperature 98.5 F (36.9 C), temperature source Oral, resp. rate 16, last menstrual period 08/07/1998, SpO2 99.00%. Physical Exam  WDWN in NAD HEENT:  EOMI, sclera anicteric Neck:  No masses, no thyromegaly Lungs:  CTA bilaterally; normal respiratory effort CV:  Regular rate and rhythm; no murmurs Abd:  Hypoactive bowel sounds, tender across lower abdomen; no guarding or rebound Ext:  Well-perfused; no edema Skin:  Warm, dry; no sign of jaundice  Assessment/Plan 1.  Possible colon perforation from colonoscopy earlier today.  Check  STAT plain films to evaluate for free air, labs If no sign of free air, will obtain CT scan abdomen/ pelvis with contrast.    Marquasia Schmieder K. 04/28/2014, 3:56 PM   Addendum:  Plain film did not show obvious free air, but dilated colon at splenic flexure with possible free air under diaphragm.  Will obtain stat ct scan abd/pelvis without contrast per discussion with Radiologist.  Lab Results  Component Value Date   WBC 9.1 04/28/2014   HGB 14.0 04/28/2014   HCT 41.7 04/28/2014   MCV 94.1 04/28/2014   PLT 266 04/28/2014    Lab Results  Component Value Date   INR 0.91 04/28/2014    Imogene Burn. Georgette Dover, MD, [facility] Surgery  General/ Trauma Surgery  04/28/2014 4:36 PM

## 2014-04-28 NOTE — ED Notes (Signed)
Holding pt in ED for OR per Dr. Donne Hazel. 3S RN updated.

## 2014-04-28 NOTE — ED Notes (Signed)
Dr. Wakefield at bedside. 

## 2014-04-28 NOTE — Progress Notes (Signed)
Patient ID: [name], female   DOB: Feb 22, 1951, 63 y.o.   MRN: [id number] Patient seen and examined. She has diffuse peritonitis now, she states pain much worse, she is tachycardic. Ct is below CLINICAL DATA: Rule out colon perforation after colonoscopy  EXAM:  CT ABDOMEN AND PELVIS WITHOUT CONTRAST  TECHNIQUE:  Multidetector CT imaging of the abdomen and pelvis was performed  following the standard protocol without IV contrast.  COMPARISON: None.  FINDINGS:  The lung bases are clear.  No renal, ureteral, or bladder calculi. No obstructive uropathy. No  perinephric stranding is seen. The kidneys are symmetric in size  without evidence for exophytic mass. The bladder is unremarkable.  The liver demonstrates no focal abnormality. The gallbladder is  unremarkable. The spleen demonstrates no focal abnormality. The  adrenal glands and pancreas are normal.  There is a moderate-sized hiatal hernia. The unopacified duodenum,  small intestine and large intestine are unremarkable, but evaluation  is limited by lack of oral contrast. There is a small amount of  pneumoperitoneum. There is a large amount of retroperitoneal free  air. There is no pneumatosis or portal venous gas. There is a small  amount of pelvic free fluid. There is no lymphadenopathy.  The abdominal aorta is normal in caliber .  There is degenerative disc disease at L3-4, L4-5 and L5-S1. There is  a broad-based disc osteophyte complex at L4-5. There is a  broad-based disc bulge at L2-3. There is bilateral facet arthropathy  at L3-4, L4-5 and L5-S1.  IMPRESSION:  1. Intraperitoneal and retroperitoneal free air consistent with  bowel perforation. Bowel perforation appears to the at the level of  the sigmoid colon.  She needs exploration urgently at this point. I don't think laparoscopy best plan right now as she is distended. Will plan laparotomy with possibility of oversewing small hole, colectomy, colectomy with  ileostomy, colectomy with colostomy. Risks and options discussed.  Will proceed asap.

## 2014-04-28 NOTE — Transfer of Care (Signed)
Immediate Anesthesia Transfer of Care Note  Patient: [name]  Procedure(s) Performed: Procedure(s): EXPLORATORY LAPAROTOMY,SIGMOID COLECTOMY (N/A) COLOSTOMY (N/A) APPLICATION OF WOUND VAC COLON RESECTION SIGMOID (N/A)  Patient Location: PACU  Anesthesia Type:General  Level of Consciousness: oriented, sedated, patient cooperative and responds to stimulation  Airway & Oxygen Therapy: Patient Spontanous Breathing and Patient connected to nasal cannula oxygen  Post-op Assessment: Report given to PACU RN, Post -op Vital signs reviewed and stable and Patient moving all extremities X 4  Post vital signs: Reviewed and stable  Complications: No apparent anesthesia complications

## 2014-04-28 NOTE — Progress Notes (Signed)
Pharmacy consulted to dose Rocephin for intra-abdominal infection. She was given 2 gm rocephin 9/22 at 1950 and 500 mg flagyl 9/22 at [place]. WBC 9.1, creat 0.57 Plan:  Rocephin 1 gm IV q24.  No further dosage adjustment needed. Pharmacy will sign off.  Eudelia Bunch, Pharm.D. [phone] 04/28/2014 10:27 PM

## 2014-04-29 LAB — HEMOGLOBIN A1C
Hgb A1c MFr Bld: 5.7 % — ABNORMAL HIGH (ref <5.7)
Mean Plasma Glucose: 117 mg/dL — ABNORMAL HIGH (ref <117)

## 2014-04-29 LAB — BASIC METABOLIC PANEL
Anion gap: 13 (ref 5–15)
BUN: 7 mg/dL (ref 6–23)
CO2: 24 mEq/L (ref 19–32)
Calcium: 8.3 mg/dL — ABNORMAL LOW (ref 8.4–10.5)
Chloride: 104 mEq/L (ref 96–112)
Creatinine, Ser: 0.65 mg/dL (ref 0.50–1.10)
GFR calc Af Amer: >90 mL/min (ref >90)
GFR calc non Af Amer: >90 mL/min (ref >90)
Glucose, Bld: 165 mg/dL — ABNORMAL HIGH (ref 70–99)
Potassium: 4 mEq/L (ref 3.7–5.3)
Sodium: 141 mEq/L (ref 137–147)

## 2014-04-29 LAB — GLUCOSE, CAPILLARY
Glucose-Capillary: 159 mg/dL — ABNORMAL HIGH (ref 70–99)
Glucose-Capillary: 142 mg/dL — ABNORMAL HIGH (ref 70–99)

## 2014-04-29 LAB — CBC
HCT: 35 % — ABNORMAL LOW (ref 36.0–46.0)
Hemoglobin: 11.8 g/dL — ABNORMAL LOW (ref 12.0–15.0)
MCH: 30.8 pg (ref 26.0–34.0)
MCHC: 33.7 g/dL (ref 30.0–36.0)
MCV: 91.4 fL (ref 78.0–100.0)
Platelets: 260 10*3/uL (ref 150–400)
RBC: 3.83 MIL/uL — ABNORMAL LOW (ref 3.87–5.11)
RDW: 13.4 % (ref 11.5–15.5)
WBC: 13.6 10*3/uL — ABNORMAL HIGH (ref 4.0–10.5)

## 2014-04-29 LAB — MRSA PCR SCREENING: MRSA by PCR: NEGATIVE

## 2014-04-29 MED ORDER — KETOROLAC TROMETHAMINE 30 MG/ML IJ SOLN
30.0000 mg | Freq: Four times a day (QID) | INTRAMUSCULAR | Status: AC
  Administered 2014-04-29 – 2014-05-01 (×8): 30 mg via INTRAVENOUS
  Filled 2014-04-29 (×11): qty 1

## 2014-04-29 MED ORDER — INSULIN ASPART 100 UNIT/ML ~~LOC~~ SOLN
0.0000 [IU] | Freq: Three times a day (TID) | SUBCUTANEOUS | Status: DC
  Administered 2014-04-29 – 2014-05-02 (×2): 1 [IU] via SUBCUTANEOUS

## 2014-04-29 MED ORDER — PHENOL 1.4 % MT LIQD
1.0000 | OROMUCOSAL | Status: DC | PRN
  Administered 2014-04-30: 1 via OROMUCOSAL
  Filled 2014-04-29: qty 177

## 2014-04-29 MED ORDER — CETYLPYRIDINIUM CHLORIDE 0.05 % MT LIQD
7.0000 mL | Freq: Two times a day (BID) | OROMUCOSAL | Status: DC
  Administered 2014-04-29 – 2014-05-04 (×8): 7 mL via OROMUCOSAL

## 2014-04-29 NOTE — Progress Notes (Signed)
1 Day Post-Op  Subjective: Patient is very sore, using PCA Complains of sore throat No flatus in bag yet  Objective: Vital signs in last 24 hours: Temp:  [97.8 F (36.6 C)-98.7 F (37.1 C)] 97.8 F (36.6 C) (09/23 0741) Pulse Rate:  [72-111] 97 (09/23 0600) Resp:  [10-21] 13 (09/23 0800) BP: (92-135)/(40-76) 106/64 mmHg (09/23 0600) SpO2:  [90 %-99 %] 96 % (09/23 0800) FiO2 (%):  [28 %] 28 % (09/22 2124) Weight:  [158 lb (71.668 kg)-159 lb 9.8 oz (72.4 kg)] 159 lb 9.8 oz (72.4 kg) (09/22 2227) Last BM Date: 04/27/14 (per patient report )  Intake/Output from previous day: 09/22 0701 - 09/23 0700 In: 3000 [I.V.:2900; IV Piggyback:100] Out: 925 [Urine:775; Emesis/NG output:50; Blood:100] Intake/Output this shift: Total I/O In: -  Out: 250 [Urine:250]  General appearance: alert, cooperative and no distress Resp: clear to auscultation bilaterally Cardio: regular rate and rhythm, S1, S2 normal, no murmur, click, rub or gallop GI: mildly distended, minimal bowel sounds; diffusely tender Midline wound VAC with good seal; ostomy viable with some thin bloody drainage  Lab Results:   Recent Labs  04/28/14 1537 04/29/14 0333  WBC 9.1 13.6*  HGB 14.0 11.8*  HCT 41.7 35.0*  PLT 266 260   BMET  Recent Labs  04/28/14 1537 04/28/14 1652 04/29/14 0333  NA 132*  --  141  K >7.7* 3.6* 4.0  CL 100  --  104  CO2 18*  --  24  GLUCOSE 106*  --  165*  BUN 6  --  7  CREATININE 0.57  --  0.65  CALCIUM 9.2  --  8.3*   PT/INR  Recent Labs  04/28/14 1537  LABPROT 12.3  INR 0.91   ABG No results found for this basename: PHART, PCO2, PO2, HCO3,  in the last 72 hours  Studies/Results: Ct Abdomen Pelvis Wo Contrast  04/28/2014   CLINICAL DATA:  Rule out colon perforation after colonoscopy  EXAM: CT ABDOMEN AND PELVIS WITHOUT CONTRAST  TECHNIQUE: Multidetector CT imaging of the abdomen and pelvis was performed following the standard protocol without IV contrast.  COMPARISON:   None.  FINDINGS: The lung bases are clear.  No renal, ureteral, or bladder calculi. No obstructive uropathy. No perinephric stranding is seen. The kidneys are symmetric in size without evidence for exophytic mass. The bladder is unremarkable.  The liver demonstrates no focal abnormality. The gallbladder is unremarkable. The spleen demonstrates no focal abnormality. The adrenal glands and pancreas are normal.  There is a moderate-sized hiatal hernia. The unopacified duodenum, small intestine and large intestine are unremarkable, but evaluation is limited by lack of oral contrast. There is a small amount of pneumoperitoneum. There is a large amount of retroperitoneal free air. There is no pneumatosis or portal venous gas. There is a small amount of pelvic free fluid. There is no lymphadenopathy.  The abdominal aorta is normal in caliber .  There is degenerative disc disease at L3-4, L4-5 and L5-S1. There is a broad-based disc osteophyte complex at L4-5. There is a broad-based disc bulge at L2-3. There is bilateral facet arthropathy at L3-4, L4-5 and L5-S1.  IMPRESSION: 1. Intraperitoneal and retroperitoneal free air consistent with bowel perforation. Bowel perforation appears to the at the level of the sigmoid colon. Critical Value/emergent results were called by telephone at the time of interpretation on 04/28/2014 at 6:04 pm to Dr. Serita Grammes, who verbally acknowledged these results.   Electronically Signed   By: Kathreen Devoid  On: 04/28/2014 18:05   Dg Abd Portable 1v  04/28/2014   CLINICAL DATA:  Abdominal pain during colonoscopy  EXAM: PORTABLE ABDOMEN - 1 VIEW  COMPARISON:  None.  FINDINGS: Upright image obtained. There is moderate generalized bowel dilatation. There is a questionable focus of pneumoperitoneum under the left hemidiaphragm. No air is seen under the right hemidiaphragm.  IMPRESSION: Question small area of pneumoperitoneum on the left. This area is subtle and warrants correlation with either  decubitus imaging or CT to further assess. Bowel dilatation is noted.  Critical Value/emergent results were called by telephone at the time of interpretation on 04/28/2014 at 4:29 pm to Dr. Donnie Mesa , who verbally acknowledged these results.   Electronically Signed   By: Lowella Grip M.D.   On: 04/28/2014 16:29    Anti-infectives: Anti-infectives   Start     Dose/Rate Route Frequency Ordered Stop   04/29/14 1800  cefTRIAXone (ROCEPHIN) 1 g in dextrose 5 % 50 mL IVPB     1 g 100 mL/hr over 30 Minutes Intravenous Every 24 hours 04/28/14 2225     04/28/14 2300  metroNIDAZOLE (FLAGYL) IVPB 500 mg     500 mg 100 mL/hr over 60 Minutes Intravenous Every 8 hours 04/28/14 2247     04/28/14 2115  metroNIDAZOLE (FLAGYL) IVPB 500 mg  Status:  Discontinued     500 mg 100 mL/hr over 60 Minutes Intravenous Every 8 hours 04/28/14 2100 04/28/14 2101   04/28/14 2115  cefTRIAXone (ROCEPHIN) 2 g in dextrose 5 % 50 mL IVPB  Status:  Discontinued     2 g 100 mL/hr over 30 Minutes Intravenous Every 24 hours 04/28/14 2100 04/28/14 2101   04/28/14 1845  [MAR Hold]  metroNIDAZOLE (FLAGYL) IVPB 500 mg     (On MAR Hold since 04/28/14 1921)   500 mg 100 mL/hr over 60 Minutes Intravenous  Once 04/28/14 1830 04/28/14 2020   04/28/14 1830  [MAR Hold]  cefTRIAXone (ROCEPHIN) 2 g in dextrose 5 % 50 mL IVPB     (On MAR Hold since 04/28/14 1921)   2 g 100 mL/hr over 30 Minutes Intravenous  Once 04/28/14 1830 04/28/14 2006      Assessment/Plan: s/p Procedure(s): EXPLORATORY LAPAROTOMY,SIGMOID COLECTOMY (N/A) COLOSTOMY (N/A) APPLICATION OF WOUND VAC COLON RESECTION SIGMOID (N/A) Reposition NG tube to take pressure off of her nose Chloraseptic spray PRN Await some bowel function before removing NG tube Continue abx because of contamination in abdomen Toradol   LOS: 1 day    Radie Berges K. 04/29/2014

## 2014-04-29 NOTE — Progress Notes (Signed)
Utilization review completed.  

## 2014-04-29 NOTE — Anesthesia Postprocedure Evaluation (Signed)
  Anesthesia Post-op Note  Patient: [name]  Procedure(s) Performed: Procedure(s): EXPLORATORY LAPAROTOMY,SIGMOID COLECTOMY (N/A) COLOSTOMY (N/A) APPLICATION OF WOUND VAC COLON RESECTION SIGMOID (N/A)  Patient Location: PACU  Anesthesia Type:General  Level of Consciousness: awake and alert   Airway and Oxygen Therapy: Patient Spontanous Breathing and Patient connected to nasal cannula oxygen  Post-op Pain: moderate  Post-op Assessment: Post-op Vital signs reviewed, Patient's Cardiovascular Status Stable, Respiratory Function Stable, Patent Airway, No signs of Nausea or vomiting and Pain level controlled  Post-op Vital Signs: Reviewed and stable  Last Vitals:  Filed Vitals:   04/29/14 0600  BP: 106/64  Pulse: 97  Temp:   Resp: 11    Complications: No apparent anesthesia complications

## 2014-04-29 NOTE — Consult Note (Signed)
WOC ostomy consult note Stoma type/location: LUQ, end colostomy Stomal assessment/size: appears 1 3/4" or bigger, budded  Peristomal assessment: pouch intact from OR Treatment options for stomal/peristomal skin: NA Output bloody drainage.  Ostomy pouching: 1pc.karaya from OR Education provided:  Left educational materials in the patient's room. She is very sleep but talkative and explains that her daughter is getting married in April and she tears up. Her daughter is in [place] and unable to be here in the hospital with her.  I have explained the surgery and the creation of a stoma, she is tearful. She reports her refrigerator just went out and ruined her floor that cost her 1600$. She reports her sister just had major surgery for cancer (esophagectomy) and has liver mets now. She is quite tearful through all this discussion.  I have tried to comfort her and explained the need to focus on her own health and care now. I have reassured her that she can go back to work at some point and she can return to her normal activities despite having the ostomy.  Provide emotional support.  No ostomy care or teaching provided today for this reason.   WOC will follow along with you for support with ostomy care and teaching.   Arianis Bowditch [place] RN,CWOCN [phone]

## 2014-04-30 LAB — BASIC METABOLIC PANEL
Anion gap: 12 (ref 5–15)
BUN: 10 mg/dL (ref 6–23)
CO2: 22 mEq/L (ref 19–32)
Calcium: 8.4 mg/dL (ref 8.4–10.5)
Chloride: 107 mEq/L (ref 96–112)
Creatinine, Ser: 0.72 mg/dL (ref 0.50–1.10)
GFR calc Af Amer: >90 mL/min (ref >90)
GFR calc non Af Amer: >90 mL/min (ref >90)
Glucose, Bld: 101 mg/dL — ABNORMAL HIGH (ref 70–99)
Potassium: 3.8 mEq/L (ref 3.7–5.3)
Sodium: 141 mEq/L (ref 137–147)

## 2014-04-30 LAB — CBC
HCT: 30.5 % — ABNORMAL LOW (ref 36.0–46.0)
Hemoglobin: 10.2 g/dL — ABNORMAL LOW (ref 12.0–15.0)
MCH: 31.1 pg (ref 26.0–34.0)
MCHC: 33.4 g/dL (ref 30.0–36.0)
MCV: 93 fL (ref 78.0–100.0)
Platelets: 236 10*3/uL (ref 150–400)
RBC: 3.28 MIL/uL — ABNORMAL LOW (ref 3.87–5.11)
RDW: 13.8 % (ref 11.5–15.5)
WBC: 11.2 10*3/uL — ABNORMAL HIGH (ref 4.0–10.5)

## 2014-04-30 LAB — GLUCOSE, CAPILLARY
Glucose-Capillary: 108 mg/dL — ABNORMAL HIGH (ref 70–99)
Glucose-Capillary: 99 mg/dL (ref 70–99)
Glucose-Capillary: 112 mg/dL — ABNORMAL HIGH (ref 70–99)
Glucose-Capillary: 94 mg/dL (ref 70–99)
Glucose-Capillary: 95 mg/dL (ref 70–99)

## 2014-04-30 MED ORDER — DIPHENHYDRAMINE HCL 50 MG/ML IJ SOLN: 12.5000 mg | Freq: Four times a day (QID) | INTRAMUSCULAR | Status: DC | PRN

## 2014-04-30 MED ORDER — ALUM & MAG HYDROXIDE-SIMETH 200-200-20 MG/5ML PO SUSP
15.0000 mL | Freq: Four times a day (QID) | ORAL | Status: DC | PRN
  Administered 2014-04-30 – 2014-05-03 (×2): 15 mL via ORAL
  Filled 2014-04-30 (×2): qty 30

## 2014-04-30 MED ORDER — ZOLPIDEM TARTRATE 5 MG PO TABS
5.0000 mg | ORAL_TABLET | Freq: Every evening | ORAL | Status: DC | PRN
  Administered 2014-04-30: 5 mg via ORAL
  Filled 2014-04-30: qty 1

## 2014-04-30 MED ORDER — NALOXONE HCL 0.4 MG/ML IJ SOLN: 0.4000 mg | INTRAMUSCULAR | Status: DC | PRN

## 2014-04-30 MED ORDER — MORPHINE SULFATE (PF) 1 MG/ML IV SOLN
INTRAVENOUS | Status: DC
  Administered 2014-04-30 (×2): 3 mg via INTRAVENOUS
  Administered 2014-05-01: 14:00:00 via INTRAVENOUS
  Administered 2014-05-01: 4.5 mg via INTRAVENOUS
  Filled 2014-04-30: qty 25

## 2014-04-30 MED ORDER — SODIUM CHLORIDE 0.9 % IJ SOLN
9.0000 mL | INTRAMUSCULAR | Status: DC | PRN
Start: 2014-04-30 — End: 2014-05-04

## 2014-04-30 MED ORDER — DIPHENHYDRAMINE HCL 12.5 MG/5ML PO ELIX: 12.5000 mg | ORAL_SOLUTION | Freq: Four times a day (QID) | ORAL | Status: DC | PRN

## 2014-04-30 MED ORDER — ONDANSETRON HCL 4 MG/2ML IJ SOLN: 4.0000 mg | Freq: Four times a day (QID) | INTRAMUSCULAR | Status: DC | PRN

## 2014-04-30 NOTE — Consult Note (Signed)
WOC wound consult note Reason for Consult: NPWT VAC dressing due today with close proximity to ostomy pouch Wound type: surgical  Pressure Ulcer POA: No Wound bed: subcutaneous tissue, clean, pink, moist Drainage (amount, consistency, odor) minimal in canister Periwound:intact  Dressing procedure/placement/frequency: 1pc of black foam used to fill the wound bed. Seal at 125mmHG. Pt tolerated well, had dose from PCA prior to dressing change.   WOC ostomy follow up Stoma type/location: LUQ, end colostomy Stomal assessment/size: 1 5/8" x 2" oval shaped stoma Peristomal assessment: intact, very close to the midline surgical wound Treatment options for stomal/peristomal skin: none today Output flatus per patient Ostomy pouching: 2 pc. 2 1/4" used today however I had to cut this wafer at the very most outer point of the cutting surface to accommodate the stoma and the wafer does fall over the NPWT drape currently.  Education provided: demonstrated pouch change to the patient, lock and roll closure.  She is very engaged and willing to learn the care of her ostomy.   Much better spirits today, seems much better. Up in the chair when I arrived.  Enrolled patient in [place] Start Discharge program: No, waiting to see if this wafer will work due to the midline wound.  May need to add 1/2 of a barrier ring next time to deal with the skin between the stoma and wound.  WOC will follow along with you for wound and ostomy care and teaching.  Dillinger [place]ston [place] RN,CWOCN [phone]

## 2014-04-30 NOTE — Progress Notes (Signed)
Pt transferred via wheelchair to [phone] with belonging and friend at bedside.

## 2014-04-30 NOTE — Progress Notes (Signed)
2 Days Post-Op  Subjective: Patient is in much better spirits Pain better controlled with Toradol.  Objective: Vital signs in last 24 hours: Temp:  [97.3 F (36.3 C)-98.7 F (37.1 C)] 97.3 F (36.3 C) (09/24 0400) Pulse Rate:  [76-95] 93 (09/24 0742) Resp:  [10-21] 16 (09/24 0742) BP: (95-115)/(51-61) 111/60 mmHg (09/24 0742) SpO2:  [96 %-98 %] 97 % (09/24 0742) Last BM Date: 04/27/14 (per patient report)  Intake/Output from previous day: 09/23 0701 - 09/24 0700 In: 3060 [I.V.:2750; NG/GT:60; IV Piggyback:250] Out: 1240 [Urine:990; Emesis/NG output:225; Drains:25] Intake/Output this shift:    General appearance: alert, cooperative and no distress Resp: clear to auscultation bilaterally Cardio: regular rate and rhythm, S1, S2 normal, no murmur, click, rub or gallop GI: soft, minimal bowel sounds Ostomy pink - flatus in bag; VAC with good seal  Lab Results:   Recent Labs  04/29/14 0333 04/30/14 0408  WBC 13.6* 11.2*  HGB 11.8* 10.2*  HCT 35.0* 30.5*  PLT 260 236   BMET  Recent Labs  04/29/14 0333 04/30/14 0408  NA 141 141  K 4.0 3.8  CL 104 107  CO2 24 22  GLUCOSE 165* 101*  BUN 7 10  CREATININE 0.65 0.72  CALCIUM 8.3* 8.4   PT/INR  Recent Labs  04/28/14 1537  LABPROT 12.3  INR 0.91   ABG No results found for this basename: PHART, PCO2, PO2, HCO3,  in the last 72 hours  Studies/Results: Ct Abdomen Pelvis Wo Contrast  04/28/2014   CLINICAL DATA:  Rule out colon perforation after colonoscopy  EXAM: CT ABDOMEN AND PELVIS WITHOUT CONTRAST  TECHNIQUE: Multidetector CT imaging of the abdomen and pelvis was performed following the standard protocol without IV contrast.  COMPARISON:  None.  FINDINGS: The lung bases are clear.  No renal, ureteral, or bladder calculi. No obstructive uropathy. No perinephric stranding is seen. The kidneys are symmetric in size without evidence for exophytic mass. The bladder is unremarkable.  The liver demonstrates no focal  abnormality. The gallbladder is unremarkable. The spleen demonstrates no focal abnormality. The adrenal glands and pancreas are normal.  There is a moderate-sized hiatal hernia. The unopacified duodenum, small intestine and large intestine are unremarkable, but evaluation is limited by lack of oral contrast. There is a small amount of pneumoperitoneum. There is a large amount of retroperitoneal free air. There is no pneumatosis or portal venous gas. There is a small amount of pelvic free fluid. There is no lymphadenopathy.  The abdominal aorta is normal in caliber .  There is degenerative disc disease at L3-4, L4-5 and L5-S1. There is a broad-based disc osteophyte complex at L4-5. There is a broad-based disc bulge at L2-3. There is bilateral facet arthropathy at L3-4, L4-5 and L5-S1.  IMPRESSION: 1. Intraperitoneal and retroperitoneal free air consistent with bowel perforation. Bowel perforation appears to the at the level of the sigmoid colon. Critical Value/emergent results were called by telephone at the time of interpretation on 04/28/2014 at 6:04 pm to Dr. Serita Grammes, who verbally acknowledged these results.   Electronically Signed   By: Kathreen Devoid   On: 04/28/2014 18:05   Dg Abd Portable 1v  04/28/2014   CLINICAL DATA:  Abdominal pain during colonoscopy  EXAM: PORTABLE ABDOMEN - 1 VIEW  COMPARISON:  None.  FINDINGS: Upright image obtained. There is moderate generalized bowel dilatation. There is a questionable focus of pneumoperitoneum under the left hemidiaphragm. No air is seen under the right hemidiaphragm.  IMPRESSION: Question small area of  pneumoperitoneum on the left. This area is subtle and warrants correlation with either decubitus imaging or CT to further assess. Bowel dilatation is noted.  Critical Value/emergent results were called by telephone at the time of interpretation on 04/28/2014 at 4:29 pm to Dr. Donnie Mesa , who verbally acknowledged these results.   Electronically Signed   By:  Lowella Grip M.D.   On: 04/28/2014 16:29    Anti-infectives: Anti-infectives   Start     Dose/Rate Route Frequency Ordered Stop   04/29/14 1800  cefTRIAXone (ROCEPHIN) 1 g in dextrose 5 % 50 mL IVPB     1 g 100 mL/hr over 30 Minutes Intravenous Every 24 hours 04/28/14 2225     04/28/14 2300  metroNIDAZOLE (FLAGYL) IVPB 500 mg     500 mg 100 mL/hr over 60 Minutes Intravenous Every 8 hours 04/28/14 2247     04/28/14 2115  metroNIDAZOLE (FLAGYL) IVPB 500 mg  Status:  Discontinued     500 mg 100 mL/hr over 60 Minutes Intravenous Every 8 hours 04/28/14 2100 04/28/14 2101   04/28/14 2115  cefTRIAXone (ROCEPHIN) 2 g in dextrose 5 % 50 mL IVPB  Status:  Discontinued     2 g 100 mL/hr over 30 Minutes Intravenous Every 24 hours 04/28/14 2100 04/28/14 2101   04/28/14 1845  [MAR Hold]  metroNIDAZOLE (FLAGYL) IVPB 500 mg     (On MAR Hold since 04/28/14 1921)   500 mg 100 mL/hr over 60 Minutes Intravenous  Once 04/28/14 1830 04/28/14 2020   04/28/14 1830  [MAR Hold]  cefTRIAXone (ROCEPHIN) 2 g in dextrose 5 % 50 mL IVPB     (On MAR Hold since 04/28/14 1921)   2 g 100 mL/hr over 30 Minutes Intravenous  Once 04/28/14 1830 04/28/14 2006      Assessment/Plan: s/p Procedure(s): EXPLORATORY LAPAROTOMY,SIGMOID COLECTOMY (N/A) COLOSTOMY (N/A) APPLICATION OF WOUND VAC COLON RESECTION SIGMOID (N/A) Transfer to floor VAC change/ ostomy bag change today Continue abx for intra-abdominal contamination s/p colon perf Clear liquids Encourage ambulation Foley is out    LOS: 2 days    Jo Chery K. 04/30/2014

## 2014-05-01 ENCOUNTER — Encounter (HOSPITAL_COMMUNITY): Payer: Self-pay | Admitting: General Surgery

## 2014-05-01 LAB — GLUCOSE, CAPILLARY
Glucose-Capillary: 91 mg/dL (ref 70–99)
Glucose-Capillary: 83 mg/dL (ref 70–99)
Glucose-Capillary: 79 mg/dL (ref 70–99)
Glucose-Capillary: 100 mg/dL — ABNORMAL HIGH (ref 70–99)

## 2014-05-01 MED ORDER — OXYCODONE HCL 5 MG/5ML PO SOLN
5.0000 mg | ORAL | Status: DC | PRN
  Administered 2014-05-01: 10 mg via ORAL
  Filled 2014-05-01: qty 10

## 2014-05-01 MED ORDER — MORPHINE SULFATE 2 MG/ML IJ SOLN
2.0000 mg | INTRAMUSCULAR | Status: DC | PRN
  Administered 2014-05-02 – 2014-05-03 (×3): 2 mg via INTRAVENOUS
  Filled 2014-05-01 (×4): qty 1

## 2014-05-01 MED ORDER — NON FORMULARY: 2.0000 mg | Freq: Every evening | Status: DC | PRN

## 2014-05-01 MED ORDER — DIPHENHYDRAMINE HCL 25 MG PO CAPS: 25.0000 mg | ORAL_CAPSULE | Freq: Every evening | ORAL | Status: DC | PRN

## 2014-05-01 MED ORDER — OXYCODONE-ACETAMINOPHEN 5-325 MG PO TABS
1.0000 | ORAL_TABLET | ORAL | Status: DC | PRN
  Administered 2014-05-01 – 2014-05-02 (×3): 2 via ORAL
  Administered 2014-05-02: 1 via ORAL
  Administered 2014-05-02: 2 via ORAL
  Filled 2014-05-01: qty 1
  Filled 2014-05-01 (×4): qty 2

## 2014-05-01 MED ORDER — ESZOPICLONE 1 MG PO TABS
2.0000 mg | ORAL_TABLET | Freq: Every evening | ORAL | Status: DC | PRN
  Administered 2014-05-01 – 2014-05-03 (×3): 2 mg via ORAL
  Filled 2014-05-01 (×3): qty 2

## 2014-05-01 NOTE — Care Management Note (Signed)
  Page 2 of 2   05/04/2014     10:43:24 AM CARE MANAGEMENT NOTE 05/04/2014  Patient:  [name], [name]   Account Number:  [id number]  Date Initiated:  04/29/2014  Documentation initiated by:  Marvetta Gibbons  Subjective/Objective Assessment:   Pt admitted s/p EXPLORATORY LAPAROTOMY,SIGMOID COLECTOMY COLOSTOMY APPLICATION OF WOUND VAC COLON RESECTION SIGMOID     Action/Plan:   PTA pt lived at home   Anticipated DC Date:  05/04/2014   Anticipated DC Plan:  [place]  CM consult      Choice offered to / List presented to:  C-1 Patient        [place] arranged  HH-1 RN      [place].   Status of service:  In process, will continue to follow Medicare Important Message given?   (If response is "NO", the following Medicare IM given date fields will be blank) Date Medicare IM given:   Medicare IM given by:   Date Additional Medicare IM given:   Additional Medicare IM given by:    Discharge Disposition:    Per UR Regulation:  Reviewed for med. necessity/level of care/duration of stay  If discussed at [place] of Stay Meetings, dates discussed:    Comments:  05-04-14 [facility] for NS WD dressing changes to midline abdominal wound BID And routine colostomy care  [facility] with [place] aware. Magdalen Spatz RN BSN   05-04-14 Confirmed face sheet information with patient. If VAC needed at home , MD , PA or NP please sign VAC application in shadow chart. Need wound measurements also. Thanks Magdalen Spatz RN BSN    05-01-14 Patient has colostomy and wound VAC . MD please order [facility] if you are in agreement. Also please sign VAC application in shadow chart if wound VAC needed for home. Thanks Magdalen Spatz RN BSN [phone]

## 2014-05-01 NOTE — Progress Notes (Signed)
Orthopedic Tech Progress Note Patient Details:  [name] 1951-02-05 [id number] Delivered abdominal binder to pt.'s nurse.  Patient ID: [name], female   DOB: Jan 08, 1951, 63 y.o.   MRN: [id number]   Darrol Poke 05/01/2014, 12:56 PM

## 2014-05-01 NOTE — Progress Notes (Signed)
Stool in bag Will advance diet PO pain meds  May use home meds - Lunesta PRN for sleep She has tried several different sleep agents and this is the only one that works for her.  She tried Ambien last night which caused severe nightmares.  Imogene Burn. Georgette Dover, MD, [facility] Surgery  General/ Trauma Surgery  05/01/2014 4:39 PM

## 2014-05-01 NOTE — Progress Notes (Signed)
3 Days Post-Op  Subjective: Not moving much, not using the pca much, not breathing to deep because of pain. No flatus and the ostomy bag only has clear fluid, no gas so far.   Objective: Vital signs in last 24 hours: Temp:  [98 F (36.7 C)-98.7 F (37.1 C)] 98.2 F (36.8 C) (09/25 0915) Pulse Rate:  [80-90] 90 (09/25 0915) Resp:  [13-18] 16 (09/25 0915) BP: (118-123)/(50-79) 122/79 mmHg (09/25 0915) SpO2:  [94 %-100 %] 95 % (09/25 0915) Last BM Date: 04/27/14 Diet:  Clears 180 PO yesterday and 360 recorded today. 30 ml from the drains No stool recorded Afebrile, VSS No labs Intake/Output from previous day: 09/24 0701 - 09/25 0700 In: 3278 [P.O.:180; I.V.:2848; IV Piggyback:250] Out: 181 [Urine:151; Drains:30] Intake/Output this shift: Total I/O In: 360 [P.O.:360] Out: -   General appearance: alert, cooperative and no distress Resp: clear to auscultation bilaterally and down in the bases some. GI: tender no BS, wound vac in place nothing in the ostomy bag except some fluid.  Lab Results:   Recent Labs  04/29/14 0333 04/30/14 0408  WBC 13.6* 11.2*  HGB 11.8* 10.2*  HCT 35.0* 30.5*  PLT 260 236    BMET  Recent Labs  04/29/14 0333 04/30/14 0408  NA 141 141  K 4.0 3.8  CL 104 107  CO2 24 22  GLUCOSE 165* 101*  BUN 7 10  CREATININE 0.65 0.72  CALCIUM 8.3* 8.4   PT/INR  Recent Labs  04/28/14 1537  LABPROT 12.3  INR 0.91     Recent Labs Lab 04/28/14 1537  AST 60*  ALT 19  ALKPHOS 35*  BILITOT 0.3  PROT 7.4  ALBUMIN 3.9     Lipase  No results found for this basename: lipase     Studies/Results: No results found.  Medications: . antiseptic oral rinse  7 mL Mouth Rinse BID  . cefTRIAXone (ROCEPHIN)  IV  1 g Intravenous Q24H  . enoxaparin (LOVENOX) injection  40 mg Subcutaneous Q24H  . insulin aspart  0-9 Units Subcutaneous TID WC  . metronidazole  500 mg Intravenous Q8H  . morphine   Intravenous 6 times per day  . pantoprazole  (PROTONIX) IV  40 mg Intravenous QHS    Assessment/Plan Perforated sigmoid colon status post colonoscopy S/p 1 sigmoid colectomy, 2 end descending colostomy, 3 negative pressure wound dressing application, [id number] cm, Dr. Serita Grammes, 04/28/2014  Post op ileus Depression/anxiety GERD AODM   Plan:  I am going to leave her on clears, encourage her moving , using PCa AND add some liquid oxycodone to help with pain. Day 4 of antibiotics.  LOS: 3 days    Shadoe Cryan 05/01/2014

## 2014-05-01 NOTE — Consult Note (Signed)
WOC ostomy follow up  Called by bedside nurse due to leakage of the ostomy pouch and questionable soilage of the VAC dressing Stoma type/location: LLQ, end colostomy Stomal assessment/size: oval shaped 2" x 1 5/8", budded  Peristomal assessment: intact  Treatment options for stomal/peristomal skin: added 1/2 2" barrier ring on the side between the [facility] dressing and the stoma to hopefully lessen risk of leakage. However the 2pc may not be flexible enough with the dip in her belly near the [facility] dressing when she is sitting.  Output liquid green stool  Ostomy pouching:  2pc. With 1/2 2" barrier ring used from 6-12 o'clock   VAC dressing intact with good seal, no need to change this today.  Cleansed the drape and seal intact.  New orders written for suggestion of ostomy pouching should this not work.  May need to use 1pc flat for the flexibility on her abdomen and the proximity to the midline wound dressing.  WOC will follow along with you for wound and ostomy care and education.  [place], [place]

## 2014-05-02 LAB — GLUCOSE, CAPILLARY
Glucose-Capillary: 128 mg/dL — ABNORMAL HIGH (ref 70–99)
Glucose-Capillary: 82 mg/dL (ref 70–99)

## 2014-05-02 LAB — COMPREHENSIVE METABOLIC PANEL
ALT: 15 U/L (ref 0–35)
AST: 30 U/L (ref 0–37)
Albumin: 2.4 g/dL — ABNORMAL LOW (ref 3.5–5.2)
Alkaline Phosphatase: 54 U/L (ref 39–117)
Anion gap: 12 (ref 5–15)
BUN: 6 mg/dL (ref 6–23)
CO2: 22 mEq/L (ref 19–32)
Calcium: 8.3 mg/dL — ABNORMAL LOW (ref 8.4–10.5)
Chloride: 106 mEq/L (ref 96–112)
Creatinine, Ser: 0.61 mg/dL (ref 0.50–1.10)
GFR calc Af Amer: >90 mL/min (ref >90)
GFR calc non Af Amer: >90 mL/min (ref >90)
Glucose, Bld: 91 mg/dL (ref 70–99)
Potassium: 3.4 mEq/L — ABNORMAL LOW (ref 3.7–5.3)
Sodium: 140 mEq/L (ref 137–147)
Total Bilirubin: 0.3 mg/dL (ref 0.3–1.2)
Total Protein: 5.5 g/dL — ABNORMAL LOW (ref 6.0–8.3)

## 2014-05-02 LAB — CBC
HCT: 27.8 % — ABNORMAL LOW (ref 36.0–46.0)
Hemoglobin: 9.3 g/dL — ABNORMAL LOW (ref 12.0–15.0)
MCH: 31.1 pg (ref 26.0–34.0)
MCHC: 33.5 g/dL (ref 30.0–36.0)
MCV: 93 fL (ref 78.0–100.0)
Platelets: 236 10*3/uL (ref 150–400)
RBC: 2.99 MIL/uL — ABNORMAL LOW (ref 3.87–5.11)
RDW: 13.5 % (ref 11.5–15.5)
WBC: 6.6 10*3/uL (ref 4.0–10.5)

## 2014-05-02 MED ORDER — PANTOPRAZOLE SODIUM 40 MG PO TBEC
40.0000 mg | DELAYED_RELEASE_TABLET | Freq: Every day | ORAL | Status: DC
  Administered 2014-05-02 – 2014-05-03 (×2): 40 mg via ORAL
  Filled 2014-05-02 (×2): qty 1

## 2014-05-02 MED ORDER — FUROSEMIDE 10 MG/ML IJ SOLN
20.0000 mg | Freq: Once | INTRAMUSCULAR | Status: AC
Start: 2014-05-02 — End: 2014-05-02
  Administered 2014-05-02: 20 mg via INTRAVENOUS
  Filled 2014-05-02 (×2): qty 2

## 2014-05-02 MED ORDER — ONDANSETRON 4 MG PO TBDP
4.0000 mg | ORAL_TABLET | Freq: Four times a day (QID) | ORAL | Status: DC | PRN
Start: 2014-05-02 — End: 2014-05-04
  Filled 2014-05-02: qty 1

## 2014-05-02 MED ORDER — IBUPROFEN 600 MG PO TABS
600.0000 mg | ORAL_TABLET | Freq: Four times a day (QID) | ORAL | Status: DC | PRN
Start: 2014-05-02 — End: 2014-05-04

## 2014-05-02 NOTE — Progress Notes (Signed)
PT Cancellation Note  Patient Details Name: [name] MRN: [id number] DOB: June 01, 1951   Cancelled Treatment:    Reason Eval/Treat Not Completed: Pain limiting ability to participate. Pt agreed to early AM visit.   Ramond Dial 05/02/2014, 4:26 PM  Mee Hives, PT MS Acute Rehab Dept. Number: [phone]

## 2014-05-02 NOTE — Progress Notes (Signed)
At [phone] assisted RN who was changing VAC, unable to get VAC to seal, ostomy pouch also changed. Pt. Very upset, tearful.  Attempted to notify a [place] nurse for assist but unable to reach anyone.   Rn to get IV restarted and medicate pt to help calm her. At around 1700 after pt medicated and more comfortable, myself, [name] and [name] removed current VAC dressing and pouch.  We first did VAC dressing with a small bridge between abd wound and ostomy stoma, able to obtain seal. New ouch apl. But it does extend onto [facility] dressing. No way to do anything different. Incision looks great, red beefy tissue without much depth, question whether pt. Will need VAC at discharge. MD needs to assess wound prior to discharge.

## 2014-05-02 NOTE — Progress Notes (Signed)
4 Days Post-Op  Subjective: Tolerating full liquids Feels swollen Minimal pain meds  Objective: Vital signs in last 24 hours: Temp:  [98.2 F (36.8 C)-98.8 F (37.1 C)] 98.5 F (36.9 C) (09/26 0517) Pulse Rate:  [73-89] 76 (09/26 0517) Resp:  [14-19] 16 (09/26 0517) BP: (134-137)/(58-69) 136/69 mmHg (09/26 0517) SpO2:  [96 %-99 %] 96 % (09/26 0517) Last BM Date: 05/02/14  Intake/Output from previous day: 09/25 0701 - 09/26 0700 In: 3957.3 [P.O.:1440; I.V.:2517.3] Out: 250 [Stool:250] Intake/Output this shift: Total I/O In: 360 [P.O.:360] Out: -   General appearance: alert, cooperative and no distress Resp: clear to auscultation bilaterally Cardio: regular rate and rhythm, S1, S2 normal, no murmur, click, rub or gallop GI: + BS, soft Ostomy - pink; greenish stool output; midline VAC with good seal  Lab Results:   Recent Labs  04/30/14 0408 05/02/14 0419  WBC 11.2* 6.6  HGB 10.2* 9.3*  HCT 30.5* 27.8*  PLT 236 236   BMET  Recent Labs  04/30/14 0408 05/02/14 0419  NA 141 140  [name] 3.8 3.4*  CL 107 106  CO2 22 22  GLUCOSE 101* 91  BUN 10 6  CREATININE 0.72 0.61  CALCIUM 8.4 8.3*   PT/INR No results found for this basename: LABPROT, INR,  in the last 72 hours ABG No results found for this basename: PHART, PCO2, PO2, HCO3,  in the last 72 hours  Studies/Results: No results found.  Anti-infectives: Anti-infectives   Start     Dose/Rate Route Frequency Ordered Stop   04/29/14 1800  cefTRIAXone (ROCEPHIN) 1 g in dextrose 5 % 50 mL IVPB  Status:  Discontinued     1 g 100 mL/hr over 30 Minutes Intravenous Every 24 hours 04/28/14 2225 05/02/14 1122   04/28/14 2300  metroNIDAZOLE (FLAGYL) IVPB 500 mg  Status:  Discontinued     500 mg 100 mL/hr over 60 Minutes Intravenous Every 8 hours 04/28/14 2247 05/02/14 1122   04/28/14 2115  metroNIDAZOLE (FLAGYL) IVPB 500 mg  Status:  Discontinued     500 mg 100 mL/hr over 60 Minutes Intravenous Every 8 hours  04/28/14 2100 04/28/14 2101   04/28/14 2115  cefTRIAXone (ROCEPHIN) 2 g in dextrose 5 % 50 mL IVPB  Status:  Discontinued     2 g 100 mL/hr over 30 Minutes Intravenous Every 24 hours 04/28/14 2100 04/28/14 2101   04/28/14 1845  [MAR Hold]  metroNIDAZOLE (FLAGYL) IVPB 500 mg     (On MAR Hold since 04/28/14 1921)   500 mg 100 mL/hr over 60 Minutes Intravenous  Once 04/28/14 1830 04/28/14 2020   04/28/14 1830  [MAR Hold]  cefTRIAXone (ROCEPHIN) 2 g in dextrose 5 % 50 mL IVPB     (On MAR Hold since 04/28/14 1921)   2 g 100 mL/hr over 30 Minutes Intravenous  Once 04/28/14 1830 04/28/14 2006      Assessment/Plan: s/p Procedure(s): EXPLORATORY LAPAROTOMY,SIGMOID COLECTOMY (N/A) COLOSTOMY (N/A) APPLICATION OF WOUND VAC COLON RESECTION SIGMOID (N/A) VAC/ ostomy bag change today Will need home VAC as well as home health RN for ostomy/ vac changes Lasix D/c IV   LOS: 4 days    [name]. 05/02/2014

## 2014-05-03 MED ORDER — MORPHINE SULFATE 2 MG/ML IJ SOLN
2.0000 mg | INTRAMUSCULAR | Status: DC | PRN
  Administered 2014-05-03: 2 mg via INTRAMUSCULAR

## 2014-05-03 MED ORDER — OXYCODONE HCL 5 MG PO TABS
10.0000 mg | ORAL_TABLET | Freq: Four times a day (QID) | ORAL | Status: DC | PRN
  Administered 2014-05-03 – 2014-05-04 (×4): 10 mg via ORAL
  Filled 2014-05-03 (×5): qty 2

## 2014-05-03 NOTE — Progress Notes (Signed)
Some stainage of stool at the edges of the wound VAC film.  Took VAC off and wound tissue looks great, beefy red.  Length of wound is 14 cm with 2 areas of widest width at 3 cm each.  There is about 2.5 cm in between the wound and the stoma and it makes it a challenge in creating a good seal for the wound VAC.  The wound is varying depths but not very deep at any point.  Called and spoke with Modena Jansky, [facility], about the wound and he gave me an order to use a wet to dry dressing for today until we can look at it tomorrow and re-evaluate need for VAC.  I used skin prep and prepped the whole area and then used a 1 piece on the stoma (which also looks great,  red and intact).  Once I got the ostomy bag on and sealed (using a tiny bit of ring material at the area of the belly button where it dips) I did the wet to dry dressing change with fluffy 2x2s, covered with 4x4s, ABD dressing and hyperfix tape.  Told the pt to call me today if any problems or concerns.

## 2014-05-03 NOTE — Evaluation (Signed)
Physical Therapy Evaluation Patient Details Name: [name] MRN: [id number] DOB: 25-Feb-1951 Today's Date: 05/03/2014   History of Present Illness  64 yo female presents after attempted screening colonoscopy earlier today by Dr. Earlie Raveling. Apparently, he was negotiating a turn from the sigmoid colon to the descending colon when there appeared to be a tear in the mucosa.   Exploratory laparotomy and signoid colectomy done, wound vac applied.  PMHx:  DM, anxiety, HLD, GERD, depression, Vit D deficiency, ulnar tunnel release,   Clinical Impression  Pt was seen for visit with check out of gait with no AD and used cga to guard her feeling of slight insecurity.  Pt did not lose balance but gait is changed per note.  Plan tomorrow to explore if any AD is needed and to use stairs to prepare for discharge home.    Follow Up Recommendations Outpatient PT;Supervision - Intermittent    Equipment Recommendations  Other (comment) (Will decide if any needed on 9/28)    Recommendations for Other Services       Precautions / Restrictions Precautions Precautions: Other (comment) (abd wound vac) Restrictions Weight Bearing Restrictions: No      Mobility  Bed Mobility Overal bed mobility: Modified Independent                Transfers Overall transfer level: Modified independent Equipment used: None                Ambulation/Gait Ambulation/Gait assistance: Supervision Ambulation Distance (Feet): 250 Feet Assistive device: None (assisted with the wound vac) Gait Pattern/deviations: Step-through pattern;Decreased dorsiflexion - right;Decreased dorsiflexion - left;Decreased step length - right;Decreased step length - left;Wide base of support Gait velocity: slow Gait velocity interpretation: Below normal speed for age/gender General Gait Details: Has limited heel strike with stiffness about ankles from edema.    Stairs            Wheelchair Mobility    Modified  [place] (Stroke Patients Only)       Balance Overall balance assessment: Modified Independent                                           Pertinent Vitals/Pain Pain Assessment: Faces Faces Pain Scale: Hurts a little bit Pain Location: abdomen Pain Intervention(s): Limited activity within patient's tolerance;Monitored during session;Other (comment) (Pt was comfortable with her activty) BP was 142/72, pulse 74 and O2 sat 97% per nsg note.    Home Living Family/patient expects to be discharged to:: Private residence Living Arrangements: Alone Available Help at Discharge: Friend(s) Type of Home: House Home Access: Stairs to enter Entrance Stairs-Rails: Left Entrance Stairs-Number of Steps: [place]: One level Home Equipment: None      Prior Function Level of Independence: Independent               Hand Dominance        Extremity/Trunk Assessment   Upper Extremity Assessment: Overall WFL for tasks assessed           Lower Extremity Assessment: Overall WFL for tasks assessed      Cervical / Trunk Assessment: Normal  Communication   Communication: No difficulties  Cognition Arousal/Alertness: Awake/alert Behavior During Therapy: WFL for tasks assessed/performed Overall Cognitive Status: Within Functional Limits for tasks assessed  General Comments General comments (skin integrity, edema, etc.): Edema in ankles somewhat limiting to pt and had concern about how to manage. Might benefit from AD but not clearly going to need it.    Exercises General Exercises - Lower Extremity Ankle Circles/Pumps: AROM;5 reps;Seated;Both Gluteal Sets: AROM;Both;20 reps;Seated Toe Raises: AROM;Both;20 reps;Standing      Assessment/Plan    PT Assessment Patient needs continued PT services  PT Diagnosis Difficulty walking   PT Problem List Decreased range of motion;Decreased activity tolerance;Decreased mobility;Decreased  skin integrity  PT Treatment Interventions DME instruction;Gait training;Stair training;Functional mobility training;Therapeutic activities;Therapeutic exercise;Patient/family education   PT Goals (Current goals can be found in the Care Plan section) Acute Rehab PT Goals Patient Stated Goal: to go home safely PT Goal Formulation: With patient Time For Goal Achievement: 05/06/14 Potential to Achieve Goals: Good    Frequency Min 3X/week   Barriers to discharge Inaccessible home environment Will need to practice stairs and decide on an assistive device if needed    Co-evaluation               End of Session Equipment Utilized During Treatment:  (none) Activity Tolerance: Patient tolerated treatment well Patient left: in chair;with call bell/phone within reach;with nursing/sitter in room Nurse Communication: Mobility status         Time: 6440-3474 PT Time Calculation (min): 27 min   Charges:   PT Evaluation $Initial PT Evaluation Tier I: 1 Procedure PT Treatments $Gait Training: 8-22 mins   PT G Codes:          Ramond Dial 2014/05/13, 11:45 AM Mee Hives, PT [place] Acute Rehab Dept. Number: [phone]

## 2014-05-03 NOTE — Progress Notes (Signed)
5 Days Post-Op  Subjective: Had a difficult time with VAC change/ ostomy bag change yesterday Still having some nausea with meals Requesting change in pain meds - will use Oxy IR  Objective: Vital signs in last 24 hours: Temp:  [97.9 F (36.6 C)-98.9 F (37.2 C)] 98.9 F (37.2 C) (09/27 0531) Pulse Rate:  [72-78] 74 (09/27 0531) Resp:  [16-20] 17 (09/27 0531) BP: (127-142)/(61-72) 142/72 mmHg (09/27 0531) SpO2:  [95 %-99 %] 97 % (09/27 0531) Last BM Date: 05/02/14  Intake/Output from previous day: 09/26 0701 - 09/27 0700 In: 1867 [P.O.:1160] Out: 10 [Drains:10] Intake/Output this shift:    General appearance: alert, cooperative and no distress Resp: clear to auscultation bilaterally Cardio: regular rate and rhythm, S1, S2 normal, no murmur, click, rub or gallop GI: soft, minimally tender VAC with good seal; slight leak from edge of ostomy bag - staining on the outside of [facility]   Lab Results:   Recent Labs  05/02/14 0419  WBC 6.6  HGB 9.3*  HCT 27.8*  PLT 236   BMET  Recent Labs  05/02/14 0419  NA 140  K 3.4*  CL 106  CO2 22  GLUCOSE 91  BUN 6  CREATININE 0.61  CALCIUM 8.3*   PT/INR No results found for this basename: LABPROT, INR,  in the last 72 hours ABG No results found for this basename: PHART, PCO2, PO2, HCO3,  in the last 72 hours  Studies/Results: No results found.  Anti-infectives: Anti-infectives   Start     Dose/Rate Route Frequency Ordered Stop   04/29/14 1800  cefTRIAXone (ROCEPHIN) 1 g in dextrose 5 % 50 mL IVPB  Status:  Discontinued     1 g 100 mL/hr over 30 Minutes Intravenous Every 24 hours 04/28/14 2225 05/02/14 1122   04/28/14 2300  metroNIDAZOLE (FLAGYL) IVPB 500 mg  Status:  Discontinued     500 mg 100 mL/hr over 60 Minutes Intravenous Every 8 hours 04/28/14 2247 05/02/14 1122   04/28/14 2115  metroNIDAZOLE (FLAGYL) IVPB 500 mg  Status:  Discontinued     500 mg 100 mL/hr over 60 Minutes Intravenous Every 8 hours 04/28/14  2100 04/28/14 2101   04/28/14 2115  cefTRIAXone (ROCEPHIN) 2 g in dextrose 5 % 50 mL IVPB  Status:  Discontinued     2 g 100 mL/hr over 30 Minutes Intravenous Every 24 hours 04/28/14 2100 04/28/14 2101   04/28/14 1845  [MAR Hold]  metroNIDAZOLE (FLAGYL) IVPB 500 mg     (On MAR Hold since 04/28/14 1921)   500 mg 100 mL/hr over 60 Minutes Intravenous  Once 04/28/14 1830 04/28/14 2020   04/28/14 1830  [MAR Hold]  cefTRIAXone (ROCEPHIN) 2 g in dextrose 5 % 50 mL IVPB     (On MAR Hold since 04/28/14 1921)   2 g 100 mL/hr over 30 Minutes Intravenous  Once 04/28/14 1830 04/28/14 2006      Assessment/Plan: s/p Procedure(s): EXPLORATORY LAPAROTOMY,SIGMOID COLECTOMY (N/A) COLOSTOMY (N/A) APPLICATION OF WOUND VAC COLON RESECTION SIGMOID (N/A) VAC and ostomy changes will continue to be a problem, due to their proximity Will need home health for discharge Possibly home in next 1-2 days.   LOS: 5 days    [name] K. 05/03/2014

## 2014-05-04 LAB — GLUCOSE, CAPILLARY
Glucose-Capillary: 142 mg/dL — ABNORMAL HIGH (ref 70–99)
Glucose-Capillary: 101 mg/dL — ABNORMAL HIGH (ref 70–99)
Glucose-Capillary: 97 mg/dL (ref 70–99)

## 2014-05-04 MED ORDER — ONDANSETRON 4 MG PO TBDP: 4.0000 mg | ORAL_TABLET | Freq: Four times a day (QID) | ORAL | Status: DC | PRN

## 2014-05-04 MED ORDER — OXYCODONE HCL 5 MG PO TABS: 5.0000 mg | ORAL_TABLET | ORAL | Status: DC | PRN

## 2014-05-04 NOTE — Progress Notes (Addendum)
D/c to home via wheelchair and friend.  Instructions read and understood VSS and pain meds given. instructrins for dressing and colostomy demonstrated understanding via teachback.

## 2014-05-04 NOTE — Discharge Instructions (Signed)
Colostomy Home Guide °A colostomy is an opening for stool to leave your body when a medical condition prevents it from leaving through the usual opening (rectum). During a surgery, a piece of large intestine (colon) is brought through a hole in the abdominal wall. The new opening is called a stoma or ostomy. A bag or pouch fits over the stoma to catch stool and gas. Your stool may be liquid, somewhat pasty, or formed. °CARING FOR YOUR STOMA  °Normally, the stoma looks a lot like the inside of your cheek: pink, red, and moist. At first it may be swollen, but this swelling will decrease within 6 weeks. °Keep the skin around your stoma clean and dry. You can gently wash your stoma and the skin around your stoma in the shower with a clean, soft washcloth. If you develop any skin irritation, your caregiver may give you a stoma powder or ointment to help heal the area. Do not use any products other than those specifically given to you by your caregiver.  °Your stoma should not be uncomfortable. If you notice any stinging or burning, your pouch may be leaking, and the skin around your stoma may be coming into contact with stool. This can cause skin irritation. If you notice stinging, replace your pouch with a new one and discard the old one. °OSTOMY POUCHES  °The pouch that fits over the ostomy can be made up of either 1 or 2 pieces. A one-piece pouch has a skin barrier piece and the pouch itself in one unit. A two-piece pouch has a skin barrier with a separate pouch that snaps on and off of the skin barrier. Either way, you should empty the pouch when it is only  to ½ full. Do not let more stool or gas build up. This could cause the pouch to leak. °Some ostomy bags have a built-in gas release valve. Ostomy deodorizer (5 drops) can be put into the pouch to prevent odor. Some people use ostomy lubricant drops inside the pouch to help the stool slide out of the bag more easily and completely.  °EMPTYING YOUR OSTOMY POUCH    °You may get lessons on how to empty your pouch from a wound-ostomy nurse before you leave the hospital. Here are the basic steps: °· Wash your hands with soap and water. °· Sit far back on the toilet. °· Put several pieces of toilet paper into the toilet water. This will prevent splashing as you empty the stool into the toilet bowl. °· Unclip or unvelcro the tail end of the pouch. °· Unroll the tail and empty stool into the toilet. °· Clean the tail with toilet paper. °· Reroll the tail, and clip or velcro it closed. °· Wash your hands again. °CHANGING YOUR OSTOMY POUCH  °Change your ostomy pouch about every 3 to 4 days for the first 6 weeks, then every 5 to7 days. Always change the bag sooner if there is any leakage or you begin to notice any discomfort or irritation of the skin around the stoma. When possible, plan to change your ostomy pouch before eating or drinking as this will lessen the chance of stool coming out during the pouch change. A wound-ostomy nurse may teach you how to change your pouch before you leave the hospital. Here are the basic steps: °· Lay out your supplies. °· Wash your hands with soap and water. °· Carefully remove the old pouch. °· Wash the stoma and allow it to dry. Men may be   advised to shave any hair around the stoma very carefully. This will make the adhesive stick better. °· Use the stoma measuring guide that comes with your pouch set to decide what size hole you will need to cut in the skin barrier piece. Choose the smallest possible size that will hold the stoma but will not touch it. °· Use the guide to trace the circle on the back of the skin barrier piece. Cut out the hole. °· Hold the skin barrier piece over the stoma to make sure the hole is the correct size. °· Remove the adhesive paper backing from the skin barrier piece. °· Squeeze stoma paste around the opening of the skin barrier piece. °· Clean and dry the skin around the stoma again. °· Carefully fit the skin  barrier piece over your stoma. °· If you are using a two-piece pouch, snap the pouch onto the skin barrier piece. °· Close the tail of the pouch. °· Put your hand over the top of the skin barrier piece to help warm it for about 5 minutes, so that it conforms to your body better. °· Wash your hands again. °DIET TIPS  °· Continue to follow your usual diet. °· Drink about eight 8 oz glasses of water each day. °· You can prevent gas by eating slowly and chewing your food thoroughly. °· If you feel concerned that you have too much gas, you can cut back on gas-producing foods, such as: °¨ Spicy foods. °¨ Onions and garlic. °¨ Cruciferous vegetables (cabbage, broccoli, cauliflower, Brussels sprouts). °¨ Beans and legumes. °¨ Some cheeses. °¨ Eggs. °¨ Fish. °¨ Bubbly (carbonated) drinks. °¨ Chewing gum. °GENERAL TIPS  °· You can shower with or without the bag in place. °· Always keep the bag on if you are bathing or swimming. °· If your bag gets wet, you can dry it with a blow-dryer set to cool. °· Avoid wearing tight clothing directly over your stoma so that it does not become irritated or bleed. Tight clothing can also prevent stool from draining into the pouch. °· It is helpful to always have an extra skin barrier and pouch with you when traveling. Do not leave them anywhere too warm, as parts of them can melt. °· Do not let your seat belt rest on your stoma. Try to keep the seat belt either above or below your stoma, or use a tiny pillow to cushion it. °· You can still participate in sports, but you should avoid activities in which there is a risk of getting hit in the abdomen. °· You can still have sex. It is a good idea to empty your pouch prior to sex. Some people and their partners feel very comfortable seeing the pouch during sex. Others choose to wear lingerie or a T-shirt that covers the device. °SEEK IMMEDIATE MEDICAL CARE IF: °· You notice a change in the size or color of the stoma, especially if it becomes  very red, purple, black, or pale white. °· You have bloody stools or bleeding from the stoma. °· You have abdominal pain, nausea, vomiting, or bloating. °· There is anything unusual protruding from the stoma. °· You have irritation or red skin around the stoma. °· No stool is passing from the stoma. °· You have diarrhea (requiring more frequent than normal pouch emptying). °Document Released: 07/27/2003 Document Revised: 10/16/2011 Document Reviewed: 12/21/2010 °ExitCare® Patient Information ©2015 [employer]. This information is not intended to replace advice given to you by your health care   provider. Make sure you discuss any questions you have with your health care provider.  [facility], [place] [phone]  OPEN ABDOMINAL SURGERY: POST OP INSTRUCTIONS  Always review your discharge instruction sheet given to you by the facility where your surgery was performed.  IF YOU HAVE DISABILITY OR FAMILY LEAVE FORMS, YOU MUST BRING THEM TO THE OFFICE FOR PROCESSING.  PLEASE DO NOT GIVE THEM TO YOUR DOCTOR.  1. A prescription for pain medication may be given to you upon discharge.  Take your pain medication as prescribed, if needed.  If narcotic pain medicine is not needed, then you may take acetaminophen (Tylenol) or ibuprofen (Advil) as needed. 2. Take your usually prescribed medications unless otherwise directed. 3. If you need a refill on your pain medication, please contact your pharmacy. They will contact our office to request authorization.  Prescriptions will not be filled after 5pm or on week-ends. 4. You should follow a light diet the first few days after arrival home, such as soup and crackers, pudding, etc.unless your doctor has advised otherwise. A high-fiber, low fat diet can be resumed as tolerated.   Be sure to include lots of fluids daily. Most patients will experience some swelling and bruising on the chest and neck area.  Ice packs will help.  Swelling and bruising can  take several days to resolve 5. Most patients will experience some swelling and bruising in the area of the incision. Ice pack will help. Swelling and bruising can take several days to resolve..  6. It is common to experience some constipation if taking pain medication after surgery.  Increasing fluid intake and taking a stool softener will usually help or prevent this problem from occurring.  A mild laxative (Milk of Magnesia or Miralax) should be taken according to package directions if there are no bowel movements after 48 hours. 7.  You may have steri-strips (small skin tapes) in place directly over the incision.  These strips should be left on the skin for 7-10 days.  If your surgeon used skin glue on the incision, you may shower in 24 hours.  The glue will flake off over the next 2-3 weeks.  Any sutures or staples will be removed at the office during your follow-up visit. You may find that a light gauze bandage over your incision may keep your staples from being rubbed or pulled. You may shower and replace the bandage daily. 8. ACTIVITIES:  You may resume regular (light) daily activities beginning the next day--such as daily self-care, walking, climbing stairs--gradually increasing activities as tolerated.  You may have sexual intercourse when it is comfortable.  Refrain from any heavy lifting or straining until approved by your doctor. a. You may drive when you no longer are taking prescription pain medication, you can comfortably wear a seatbelt, and you can safely maneuver your car and apply brakes b. Return to Work: ___________________________________ 56. You should see your doctor in the office for a follow-up appointment approximately two weeks after your surgery.  Make sure that you call for this appointment within a day or two after you arrive home to insure a convenient appointment time. OTHER INSTRUCTIONS:   _____________________________________________________________ _____________________________________________________________  WHEN TO CALL YOUR DOCTOR: 1. Fever over 101.0 2. Inability to urinate 3. Nausea and/or vomiting 4. Extreme swelling or bruising 5. Continued bleeding from incision. 6. Increased pain, redness, or drainage from the incision. 7. Difficulty swallowing or breathing 8. Muscle cramping or spasms. 9. Numbness  or tingling in hands or feet or around lips.  The clinic staff is available to answer your questions during regular business hours.  Please dont hesitate to call and ask to speak to one of the nurses if you have concerns.  For further questions, please visit [email or web address]  Dressing Change A dressing is a material placed over wounds. It keeps the wound clean, dry, and protected from further injury. This provides an environment that favors wound healing.  BEFORE YOU BEGIN  Get your supplies together. Things you may need include:  Saline solution.  Flexible gauze dressing.  Medicated cream.  Tape.  Gloves.  Abdominal dressing pads.  Gauze squares.  Plastic bags.  Take pain medicine 30 minutes before the dressing change if you need it.  Take a shower before you do the first dressing change of the day. Use plastic wrap or a plastic bag to prevent the dressing from getting wet. REMOVING YOUR OLD DRESSING   Wash your hands with soap and water. Dry your hands with a clean towel.  Put on your gloves.  Remove any tape.  Carefully remove the old dressing. If the dressing sticks, you may dampen it with warm water to loosen it, or follow your caregiver's specific directions.  Remove any gauze or packing tape that is in your wound.  Take off your gloves.  Put the gloves, tape, gauze, or any packing tape into a plastic bag. CHANGING YOUR DRESSING  Open the supplies.  Take the cap off the saline solution.  Open the gauze package  so that the gauze remains on the inside of the package.  Put on your gloves.  Clean your wound as told by your caregiver.  If you have been told to keep your wound dry, follow those instructions.  Your caregiver may tell you to do one or more of the following:  Pick up the gauze. Pour the saline solution over the gauze. Squeeze out the extra saline solution.  Put medicated cream or other medicine on your wound if you have been told to do so.  Put the solution soaked gauze only in your wound, not on the skin around it.  Pack your wound loosely or as told by your caregiver.  Put dry gauze on your wound.  Put abdominal dressing pads over the dry gauze if your wet gauze soaks through.  Tape the abdominal dressing pads in place so they will not fall off. Do not wrap the tape completely around the affected part (arm, leg, abdomen).  Wrap the dressing pads with a flexible gauze dressing to secure it in place.  Take off your gloves. Put them in the plastic bag with the old dressing. Tie the bag shut and throw it away.  Keep the dressing clean and dry until your next dressing change.  Wash your hands. SEEK MEDICAL CARE IF:  Your skin around the wound looks red.  Your wound feels more tender or sore.  You see pus in the wound.  Your wound smells bad.  You have a fever.  Your skin around the wound has a rash that itches and burns.  You see black or yellow skin in your wound that was not there before.  You feel nauseous, throw up, and feel very tired. Document Released: 08/31/2004 Document Revised: 10/16/2011 Document Reviewed: 06/05/2011 [facility] Patient Information 2015 [place], [place]. This information is not intended to replace advice given to you by your health care provider. Make sure you discuss any questions you have with  your health care provider.

## 2014-05-04 NOTE — Discharge Summary (Signed)
Jo Pinder, MD, MPH, FACS Trauma: [phone] General Surgery: [phone]  

## 2014-05-04 NOTE — Progress Notes (Signed)
Physical Therapy Treatment Patient Details Name: [name] MRN: [id number] DOB: June 27, 1951 Today's Date: 05/04/2014    History of Present Illness 63 yo female presents after attempted screening colonoscopy earlier today by Dr. Earlie Raveling. Apparently, he was negotiating a turn from the sigmoid colon to the descending colon when there appeared to be a tear in the mucosa.   Exploratory laparotomy and signoid colectomy done, wound vac applied.  PMHx:  DM, anxiety, HLD, GERD, depression, Vit D deficiency, ulnar tunnel release,     PT Comments    Patient is progressing well towards physical therapy goals, ambulating up to 520 feet without an assistive device. She was able to safely complete dynamic balance challenges and stair training today. Pt declines practice with a cane for very minor sway noted while ambulating, but no loss of balance during difficult tasks. Feel she is adequate for d/c from a mobility standpoint when medically ready.    Follow Up Recommendations  Outpatient PT;Supervision - Intermittent     Equipment Recommendations  None recommended by PT    Recommendations for Other Services       Precautions / Restrictions Restrictions Weight Bearing Restrictions: No    Mobility  Bed Mobility                  Transfers Overall transfer level: Modified independent Equipment used: None                Ambulation/Gait Ambulation/Gait assistance: Supervision Ambulation Distance (Feet): 520 Feet (additional 120) Assistive device: None Gait Pattern/deviations: Step-through pattern;Decreased stride length;Wide base of support   Gait velocity interpretation: Below normal speed for age/gender General Gait Details: Performed dynamic gait activities with only minimal sway noted. VC for forward gaze intermittently. Pt declines use of single point cane and did not lose balance during ambulatory bout. Able to safely perform head turns, changes in gait speed,  turns, and high stepping while ambulating.   Stairs Stairs: Yes Stairs assistance: Supervision Stair Management: One rail Right;Alternating pattern;Step to pattern;Forwards Number of Stairs: 13 General stair comments: Instructions for step-to pattern for safety. Correctly uses rail similar to home environment. Pt cautious with steps but demonstrated good stability.  Wheelchair Mobility    Modified Rankin (Stroke Patients Only)       Balance                                    Cognition Arousal/Alertness: Awake/alert Behavior During Therapy: WFL for tasks assessed/performed Overall Cognitive Status: Within Functional Limits for tasks assessed                      Exercises      General Comments        Pertinent Vitals/Pain Pain Assessment: 0-10 Pain Score:  ("not bad" no value given) Pain Location: abdomen Pain Intervention(s): Monitored during session;Repositioned    Home Living                      Prior Function            PT Goals (current goals can now be found in the care plan section) Acute Rehab PT Goals PT Goal Formulation: With patient Time For Goal Achievement: 05/06/14 Potential to Achieve Goals: Good Progress towards PT goals: Progressing toward goals    Frequency  Min 3X/week    PT Plan Current plan remains appropriate  Co-evaluation             End of Session Equipment Utilized During Treatment: Gait belt Activity Tolerance: Patient tolerated treatment well Patient left: in chair;with call bell/phone within reach     Time: 0849-0905 PT Time Calculation (min): 16 min  Charges:  $Gait Training: 8-22 mins                    G Codes:      Elayne Snare, [place]  Ellouise Newer 05/04/2014, 9:50 AM

## 2014-05-04 NOTE — Discharge Summary (Signed)
Patient ID: Esbeydi Manago MRN: [id number] DOB/AGE: [birth year] 63 y.o.  Admit date: 04/28/2014 Discharge date: 05/04/2014  Procedures:  #1 sigmoid colectomy  #2 end descending colostomy  #3 negative pressure wound dressing application, 82N0N3 cm By Dr. Rolm Bookbinder 04-28-14  Consults: None  Reason for Admission: 63 yo female presents after attempted screening colonoscopy earlier today by Dr. Earlie Raveling. Apparently, he was negotiating a turn from the sigmoid colon to the descending colon when there appeared to be a tear in the mucosa. He aborted the procedure and moved her to the recovery area. Plain films showed no obvious free air, but the patient developed some abdominal pain. She was then referred to the ED. General Surgery was notified while the patient was enroute. She has been mildly hypotensive enroute.  Admission Diagnoses:  1. Pneumoperitoneum s/p colonoscopy  Hospital Course: The patient was admitted and her initial plain film did not show free air.  A CT scan was completed that revealed a large perforation of her colon.  She was taken emergently to the operating room where she underwent the above listed procedure.  The patient tolerated this well.  She was transferred to SDU due to some hypotension.  She responded well with fluid.  She had an NGT  Postoperatively, as well as a colostomy, given contamination from her colonoscopy prep.  She was also kept on IV abx therapy due to contamination as well.  Her NGT was removed on POD 2 and she was given clear liquids. She was also transferred to the floor.  Initially, the patient had a wound VAC, but due to proximity of her colostomy and her wound, she was having trouble getting the VAC to seal.  Therefore, this was discontinued and she was started on NS WD dressing changes to her midline wound.  On POD 6, she was tolerating a regular diet, just with decreased appetite as expected.  She was mobilizing well and her pain was controlled.   She was felt stable at this time for dc home.  PE: Abd: soft, appropriately tender, some reactive erythema around her colostomy and her abdominal wound felt to be reaction to the adhesive tape.  Wound is otherwise 100% clean with good granulation tissue.  Colostomy in place with good feculent output and air.  Stoma is pink and viable.  Discharge Diagnoses:  Active Problems:   Abdominal pain   S/P colectomy, colostomy (Hartman's procedure) pneumoperitoneum from colonoscopic perforation Hypotension, resolved   Discharge Medications:   Medication List         eszopiclone 2 MG Tabs tablet  Commonly known as:  LUNESTA  Take 2 mg by mouth at bedtime as needed for sleep. Take immediately before bedtime     FISH OIL PO  Take 1 capsule by mouth daily.     omeprazole 10 MG capsule  Commonly known as:  PRILOSEC  Take 10 mg by mouth daily.     ondansetron 4 MG disintegrating tablet  Commonly known as:  ZOFRAN-ODT  Take 1 tablet (4 mg total) by mouth every 6 (six) hours as needed for nausea or vomiting.     oxyCODONE 5 MG immediate release tablet  Commonly known as:  ROXICODONE  Take 1-2 tablets (5-10 mg total) by mouth every 4 (four) hours as needed for severe pain.        Discharge Instructions:     Follow-up Information   Follow up with [facility] MD. Schedule an appointment as soon as possible for a visit in 3  weeks.   Specialty:  General Surgery   Contact information:   [address] [place] [address] [phone]       Signed: Henreitta Cea 05/04/2014, 10:39 AM

## 2014-05-04 NOTE — Consult Note (Addendum)
WOC wound follow-up consult note Assessed abd wound with CCS PA at bedside.  Wound beefy red, Vac D/Ced.  Pt with red macerated edges around wound, appearance consistent with medical adhesive related skin injury.  Moist fluffed gauze applied and changed to paper tape instead of adhesive.    WOC ostomy follow up Stoma type/location: Colostomy to left lower quad. Stomal assessment/size:  Stoma red and viable, oval, slightly above skin level, 1 1/2 inches. Peristomal assessment: Pt with red macerated edges around wound, appearance consistent with medical adhesive related skin injury. Treatment options for stomal/peristomal skin: Sting free skin prep samples given to patient.   Output 50cc dark brown semi formed stool Ostomy pouching: 1pc with barrier ring.  Education provided:  Demonstrated pouch change using one piece flexible pouch anfd barrier ring to maintain seal since it is located close to the wound.  Pt able to open and close with velcro to empty without assistance. Discussed ordering supplies and pouching routines.  Pt verbalizes understanding.  4 sets of ostomy pouches and 4 pouches ordered to bedside for home use. Enrolled patient in [place] Start Discharge program: Yes Julien Girt MSN, RN, [place], [place], [place]

## 2014-05-06 ENCOUNTER — Emergency Department (HOSPITAL_COMMUNITY)
Admission: EM | Admit: 2014-05-06 | Discharge: 2014-05-06 | Disposition: A | Discharge status: Home / Self Care | Payer: 59 | Attending: Emergency Medicine | Admitting: Emergency Medicine

## 2014-05-06 ENCOUNTER — Emergency Department (HOSPITAL_COMMUNITY): Payer: 59

## 2014-05-06 ENCOUNTER — Encounter (HOSPITAL_COMMUNITY): Payer: Self-pay | Admitting: Emergency Medicine

## 2014-05-06 DIAGNOSIS — E119 Type 2 diabetes mellitus without complications: Secondary | ICD-10-CM | POA: Insufficient documentation

## 2014-05-06 DIAGNOSIS — R109 Unspecified abdominal pain: Secondary | ICD-10-CM | POA: Diagnosis present

## 2014-05-06 DIAGNOSIS — Z8659 Personal history of other mental and behavioral disorders: Secondary | ICD-10-CM | POA: Insufficient documentation

## 2014-05-06 DIAGNOSIS — J9819 Other pulmonary collapse: Secondary | ICD-10-CM | POA: Insufficient documentation

## 2014-05-06 DIAGNOSIS — R0602 Shortness of breath: Secondary | ICD-10-CM | POA: Diagnosis not present

## 2014-05-06 DIAGNOSIS — J90 Pleural effusion, not elsewhere classified: Secondary | ICD-10-CM | POA: Insufficient documentation

## 2014-05-06 DIAGNOSIS — Z792 Long term (current) use of antibiotics: Secondary | ICD-10-CM | POA: Diagnosis not present

## 2014-05-06 DIAGNOSIS — K219 Gastro-esophageal reflux disease without esophagitis: Secondary | ICD-10-CM | POA: Insufficient documentation

## 2014-05-06 DIAGNOSIS — J9811 Atelectasis: Secondary | ICD-10-CM

## 2014-05-06 DIAGNOSIS — Z79899 Other long term (current) drug therapy: Secondary | ICD-10-CM | POA: Insufficient documentation

## 2014-05-06 LAB — CBC WITH DIFFERENTIAL/PLATELET
Basophils Absolute: 0 10*3/uL (ref 0.0–0.1)
Basophils Relative: 0 % (ref 0–1)
Eosinophils Absolute: 0.3 10*3/uL (ref 0.0–0.7)
Eosinophils Relative: 5 % (ref 0–5)
HCT: 32.4 % — ABNORMAL LOW (ref 36.0–46.0)
Hemoglobin: 11.1 g/dL — ABNORMAL LOW (ref 12.0–15.0)
Lymphocytes Relative: 20 % (ref 12–46)
Lymphs Abs: 1.4 10*3/uL (ref 0.7–4.0)
MCH: 31.4 pg (ref 26.0–34.0)
MCHC: 34.3 g/dL (ref 30.0–36.0)
MCV: 91.5 fL (ref 78.0–100.0)
Monocytes Absolute: 0.6 10*3/uL (ref 0.1–1.0)
Monocytes Relative: 8 % (ref 3–12)
Neutro Abs: 4.8 10*3/uL (ref 1.7–7.7)
Neutrophils Relative %: 67 % (ref 43–77)
Platelets: 370 10*3/uL (ref 150–400)
RBC: 3.54 MIL/uL — ABNORMAL LOW (ref 3.87–5.11)
RDW: 13.6 % (ref 11.5–15.5)
WBC: 7.1 10*3/uL (ref 4.0–10.5)

## 2014-05-06 LAB — BASIC METABOLIC PANEL
Anion gap: 12 (ref 5–15)
BUN: 7 mg/dL (ref 6–23)
CO2: 28 mEq/L (ref 19–32)
Calcium: 8.9 mg/dL (ref 8.4–10.5)
Chloride: 102 mEq/L (ref 96–112)
Creatinine, Ser: 0.68 mg/dL (ref 0.50–1.10)
GFR calc Af Amer: >90 mL/min (ref >90)
GFR calc non Af Amer: >90 mL/min (ref >90)
Glucose, Bld: 149 mg/dL — ABNORMAL HIGH (ref 70–99)
Potassium: 3.1 mEq/L — ABNORMAL LOW (ref 3.7–5.3)
Sodium: 142 mEq/L (ref 137–147)

## 2014-05-06 LAB — I-STAT TROPONIN, ED: Troponin i, poc: 0.01 ng/mL (ref 0.00–0.08)

## 2014-05-06 MED ORDER — IOHEXOL 350 MG/ML SOLN
100.0000 mL | Freq: Once | INTRAVENOUS | Status: AC | PRN
  Administered 2014-05-06: 100 mL via INTRAVENOUS

## 2014-05-06 MED ORDER — LEVOFLOXACIN 500 MG PO TABS: 500.0000 mg | ORAL_TABLET | Freq: Every day | ORAL | Status: DC

## 2014-05-06 MED ORDER — POTASSIUM CHLORIDE ER 10 MEQ PO TBCR: 10.0000 meq | EXTENDED_RELEASE_TABLET | Freq: Two times a day (BID) | ORAL | Status: DC

## 2014-05-06 MED ORDER — LEVOFLOXACIN 750 MG PO TABS
750.0000 mg | ORAL_TABLET | Freq: Once | ORAL | Status: AC
  Administered 2014-05-06: 750 mg via ORAL
  Filled 2014-05-06: qty 1

## 2014-05-06 MED ORDER — MORPHINE SULFATE 4 MG/ML IJ SOLN
4.0000 mg | INTRAMUSCULAR | Status: DC | PRN
Start: 2014-05-06 — End: 2014-05-06
  Administered 2014-05-06: 4 mg via INTRAVENOUS
  Filled 2014-05-06: qty 1

## 2014-05-06 MED ORDER — ONDANSETRON HCL 4 MG/2ML IJ SOLN
4.0000 mg | Freq: Once | INTRAMUSCULAR | Status: AC
  Administered 2014-05-06: 4 mg via INTRAVENOUS
  Filled 2014-05-06: qty 2

## 2014-05-06 MED ORDER — FUROSEMIDE 20 MG PO TABS
20.0000 mg | ORAL_TABLET | Freq: Every day | ORAL | Status: DC
  Administered 2014-05-06: 20 mg via ORAL
  Filled 2014-05-06: qty 1

## 2014-05-06 MED ORDER — FUROSEMIDE 20 MG PO TABS: 20.0000 mg | ORAL_TABLET | Freq: Every day | ORAL | Status: DC

## 2014-05-06 MED ORDER — SODIUM CHLORIDE 0.9 % IV BOLUS (SEPSIS)
500.0000 mL | Freq: Once | INTRAVENOUS | Status: AC
  Administered 2014-05-06: 500 mL via INTRAVENOUS

## 2014-05-06 MED ORDER — SODIUM CHLORIDE 0.9 % IV SOLN: Freq: Once | INTRAVENOUS | Status: DC

## 2014-05-06 NOTE — Discharge Instructions (Signed)
Exercise. Use your Incentive spirometer. Rx: Levaquin (antibiotic), Lasix (diuretic), and potassium You should have a repeat chest x-ray in 2-3 weeks to ensure that the fluid in atelectasis has resolved.  Atelectasis Atelectasis is a collapse of the small air sacs in the lungs (alveoli). When this occurs, all or part of a lung collapses and becomes airless. It can be caused by various things and is a common problem after surgery. The severity of atelectasis will vary depending on the size of the area involved and the underlying cause of the condition. CAUSES  There are multiple causes for atelectasis:   Shallow breathing, particularly if there is an injury to your chest wall or abdomen that makes it painful to take a deep breath. This commonly occurs after surgery.  Obstruction of your airways (bronchi or bronchioles). This may be caused by a buildup of mucus (mucus plug), tumors, blood clots (pulmonary embolus), or inhaled foreign bodies. Mucus plugs occur when the lungs do not expand enough to get rid of mucus.  Outside pressure on the lung. This may be caused by tumors, fluid (pleural effusion), or a leakage of air between the lung and rib cage (pneumothorax).   Infections such as pneumonia.  Scarring in lung tissue left over from previous infection or injury.  Some diseases such as cystic fibrosis. SIGNS AND SYMPTOMS  Often, atelectasis will have no symptoms. When symptoms occur, they include:  Shortness of breath.   Bluish color to your nails, lips, or mouth (cyanosis). DIAGNOSIS  Your health care provider may suspect atelectasis based on symptoms and physical findings. A chest X-ray may be done to confirm the diagnosis. More specialized X-ray exams are sometimes required.  TREATMENT  Treatment will depend on the cause of the atelectasis. Treatment may include:  Purposeful coughing to loosen mucus plugs in the lungs.  Chest physiotherapy. This consists of clapping or  percussion on the chest over the lungs to further loosen mucus plugs.  Postural drainage techniques. This involves positioning your body so your head is lower than your chest. B[place]all relaxed deep breathing whenever you are sitting down. A good technique is to take a few relaxed deep breaths each time a commercial comes on if you are watching television.  If you were given a deep breathing device (such as an incentive spirometer) or a mucus clearance device, use this regularly as directed by your health care provider.  Try to cough several times a day as directed by your health care provider.  Perform any chest physiotherapy or postural drainage techniques as directed by your health care provider. If necessary, have someone (such as a family member) assist you with these techniques.  When lying down, lie on the unaffected side to encourage mucus drainage.  Stay physically active as much as possible. SEEK IMMEDIATE MEDICAL CARE IF:   You develop increasing problems with your breathing.   You develop severe chest pain.   You develop severe coughing, or you cough up blood.   You have a fever or persistent symptoms for more than 2-3 days.   You have a fever and your symptoms suddenly get worse.  MAKE SURE YOU:  Understand these instructions.  Will watch your condition.  Will get help right away if you are not doing well or get worse. Document Released: 07/24/2005 Document Revised: 07/29/2013 Document Reviewed: 01/29/2013 [facility] Patient Information 2015 [place], [place]. This information is not intended to replace advice given to you by your health care provider. Make  sure you discuss any questions you have with your health care provider.  Pleural Effusion The lining covering your lungs and the inside of your chest is called the pleura. Usually, the space between the two pleura contains no air and only a thin layer of fluid. A pleural effusion is an  abnormal buildup of fluid in the pleural space. Fluid gathers when there is increased pressure in the lung vessels. This forces fluids out of the lungs and into the pleural space. Vessels may also leak fluids when there are infections, such as pneumonia, or other causes of soreness and redness (inflammation). Fluids leak into the lungs when protein in the blood is low or when certain vessels (lymphatics) are blocked. Finding a pleural effusion is important because it is usually caused by another disease. In order to treat a pleural effusion, your health care provider needs to find its cause. If left untreated, a large amount of fluid can build up and cause collapse of the lung. CAUSES   Heart failure.  Infections (pneumonia, tuberculosis), pulmonary embolism, pulmonary infarction.  Cancer (primary lung and metastatic), asbestosis.  Liver failure (cirrhosis).  Nephrotic syndrome, peritoneal dialysis, kidney problems (uremia).  Collagen vascular disease (systemic lupus erythematosus, rheumatoid arthritis).  Injury (trauma) to the chest or rupture of the digestive tube (esophagus).  Material in the chest or pleural space (hemothorax, chylothorax).  Pancreatitis.  Surgery.  Drug reactions. SYMPTOMS  A pleural effusion can decrease the amount of space available for breathing and make you short of breath. The fluid can become infected, which may cause pain and fever. Often, the pain is worse when taking a deep breath. The underlying disease (heart failure, pneumonia, blood clot, tuberculosis, cancer) may also cause symptoms. DIAGNOSIS   Your health care provider can usually tell what is wrong by talking to you (taking a history), doing an exam, and taking a routine X-ray. If the X-ray shows fluid in your chest, often fluid is removed from your chest with a needle for testing (diagnostic thoracentesis).  Sometimes, more specialized X-rays may be needed.  Sometimes, a small piece of tissue  is removed and examined by a specialist (biopsy). TREATMENT  Treatment varies based on what caused the pleural effusion. Treatments include:  Removing as much fluid as possible using a needle (thoracentesis) to improve the cough and shortness of breath. This is a simple procedure that can be done at bedside. The risks are bleeding, infection, collapse of a lung, or low blood pressure.  Placing a tube in the chest to drain the effusion (tube thoracostomy). This is often used when there is an infection in the fluid. This is a simple procedure that can often be done at bedside or in a clinic. The procedure may be painful. The risks are the same as using a needle to drain the fluid. The chest tube usually remains for a few days and is connected to suction to improve fluid drainage. After placement, the tube usually does not cause much discomfort.  Surgical removal of fibrous debris in and around the pleural space (decortication). This may be done with a flexible telescope (thoracoscope) through a small or large cut (incision). This is helpful for patients who have fibrosis or scar tissue that prevents complete lung expansion. The risks are infection, blood loss, and side effects from general anesthesia.  Sometimes, a procedure called pleurodesis is done. A chest tube is placed and the fluid is drained. Next, an agent (tetracycline, talc powder) is added to the pleural space.  This causes the lung and chest wall to stick together (adhesion). This leaves no potential space for fluid to build up. The risks include infection, blood loss, and side effects from general anesthesia.  If the effusion is caused by infection, it may be treated with antibiotics and may improve without draining. HOME CARE INSTRUCTIONS   Take any medicines exactly as prescribed.  Follow up with your health care provider as directed.  Monitor your exercise capacity (the amount of walking you can do before you get short of  breath).  Do not use any tobacco products including cigarettes, chewing tobacco, or electronic cigarettes. SEEK MEDICAL CARE IF:   Your exercise capacity seems to get worse or does not improve with time.  You do not recover from your illness.  You have drainage, redness, swelling, or pain at any incision or puncture sites. SEEK IMMEDIATE MEDICAL CARE IF:   Shortness of breath or chest pain develops or gets worse.  You have a fever.  You develop a new cough, especially if the mucus (phlegm) is discolored. MAKE SURE YOU:   Understand these instructions.  Will watch your condition.  Will get help right away if you are not doing well or get worse. Document Released: 07/24/2005 Document Revised: 12/08/2013 Document Reviewed: 03/15/2007 [facility] Patient Information 2015 [place], [place]. This information is not intended to replace advice given to you by your health care provider. Make sure you discuss any questions you have with your health care provider.

## 2014-05-06 NOTE — ED Notes (Signed)
Pt here for pain in right ribs onset last night, pt here last week for perforated bowel from colonoscopy and exploratory lap. Concerned for PE, pt refused to get on bed at this time, awaiting on EDP to see.

## 2014-05-06 NOTE — ED Provider Notes (Signed)
CSN: [id number]     Arrival date & time 05/06/14  0705 History   First MD Initiated Contact with Patient 05/06/14 [phone]     Chief Complaint  Patient presents with  . Abdominal Pain  . Shortness of Breath      HPI  Patient presents for right anterior lateral chest pain. Significant recent past medical history includes admission on 9/22 with a colonic sigmoid perforation during colonoscopy. Underwent sigmoid colectomy in Hartman's procedure with temporary diverting colostomy. Convalesced. Recovered. Discharged 48 hours ago. Has a friend staying with her that is a PA. Patient complained of some right-sided anterior pleuritic chest pain since discharge. And encouraged her to come in for evaluation for possible pulmonary embolus. Patient has had no cough. No fevers chills. Her ostomy output of feces and air.  No history of pulmonary embolus. No history of cardiac disease.  Past Medical History  Diagnosis Date  . Depression   . GERD (gastroesophageal reflux disease)   . Hyperlipidemia   . Diabetes mellitus without complication   . Anxiety   . Vitamin D deficiency   . Suicide attempt    Past Surgical History  Procedure Laterality Date  . Abdominal hysterectomy  1999  . Elbow surgery  2007  . Ulnar tunnel release  2010  . Blepharoplasty  2010  . Cosmetic surgery    . Eye surgery    . Debridement tennis elbow    . Breast surgery  2001    Breast reduction  . Dilation and curettage of uterus    . Laparotomy N/A 04/28/2014    Procedure: EXPLORATORY LAPAROTOMY,SIGMOID COLECTOMY;  Surgeon: Rolm Bookbinder, MD;  Location: [place];  Service: General;  Laterality: N/A;  . Colostomy N/A 04/28/2014    Procedure: COLOSTOMY;  Surgeon: Rolm Bookbinder, MD;  Location: [place];  Service: General;  Laterality: N/A;  . Application of wound vac  04/28/2014    Procedure: APPLICATION OF WOUND VAC;  Surgeon: Rolm Bookbinder, MD;  Location: [place];  Service: General;;  . Colostomy revision N/A 04/28/2014    Procedure: COLON RESECTION SIGMOID;  Surgeon: Rolm Bookbinder, MD;  Location: [facility];  Service: General;  Laterality: N/A;   Family History  Problem Relation Age of Onset  . Hyperlipidemia Father   . Hypertension Father   . Diabetes Father   . Cancer Father     History of bladder cancer  . Hypertension Mother   . Diabetes Mother   . Heart disease Maternal Grandmother   . Stroke Maternal Grandfather   . Hyperlipidemia Brother   . Cancer Paternal Grandfather     pancreatic cancer  . Cancer Paternal Grandmother     uterine  . Heart disease Father   . Esophageal cancer Sister    History  Substance Use Topics  . Smoking status: Never Smoker   . Smokeless tobacco: Never Used  . Alcohol Use: 5.0 oz/week    10 drink(s) per week   OB History   Grav Para Term Preterm Abortions TAB SAB Ect Mult Living   7 2        2      Review of Systems  Constitutional: Negative for fever, chills, diaphoresis, appetite change and fatigue.  HENT: Negative for mouth sores, sore throat and trouble swallowing.   Eyes: Negative for visual disturbance.  Respiratory: Positive for shortness of breath. Negative for cough, chest tightness and wheezing.   Cardiovascular: Positive for chest pain.  Gastrointestinal: Negative for nausea, vomiting, abdominal pain, diarrhea and abdominal  distention.  Endocrine: Negative for polydipsia, polyphagia and polyuria.  Genitourinary: Negative for dysuria, frequency and hematuria.  Musculoskeletal: Negative for gait problem.  Skin: Negative for color change, pallor and rash.  Neurological: Negative for dizziness, syncope, light-headedness and headaches.  Hematological: Does not bruise/bleed easily.  Psychiatric/Behavioral: Negative for behavioral problems and confusion.      Allergies  Bee venom and Statins  Home Medications   Prior to Admission medications   Medication Sig Start Date End Date Taking? Authorizing Provider  eszopiclone (LUNESTA) 2 MG TABS  tablet Take 2 mg by mouth at bedtime as needed for sleep. Take immediately before bedtime   Yes Historical Provider, MD  Omega-3 Fatty Acids (FISH OIL PO) Take 1 capsule by mouth daily.   Yes Historical Provider, MD  omeprazole (PRILOSEC) 10 MG capsule Take 10 mg by mouth daily.   Yes Historical Provider, MD  oxyCODONE (ROXICODONE) 5 MG immediate release tablet Take 1-2 tablets (5-10 mg total) by mouth every 4 (four) hours as needed for severe pain. 05/04/14  Yes Saverio Danker, PA-C  furosemide (LASIX) 20 MG tablet Take 1 tablet (20 mg total) by mouth daily. 05/06/14   Tanna Furry, MD  levofloxacin (LEVAQUIN) 500 MG tablet Take 1 tablet (500 mg total) by mouth daily. 05/06/14   Tanna Furry, MD  ondansetron (ZOFRAN-ODT) 4 MG disintegrating tablet Take 1 tablet (4 mg total) by mouth every 6 (six) hours as needed for nausea or vomiting. 05/04/14   Saverio Danker, PA-C  potassium chloride (K-DUR) 10 MEQ tablet Take 1 tablet (10 mEq total) by mouth 2 (two) times daily. 05/06/14   Tanna Furry, MD   BP 130/43  Pulse 91  Temp(Src) 98.7 F (37.1 C) (Oral)  Resp 15  Ht 5\' 6"  (1.676 m)  Wt 160 lb (72.576 kg)  BMI 25.84 kg/m2  SpO2 95%  LMP 08/07/1998 Physical Exam  Constitutional: She is oriented to person, place, and time. She appears well-developed and well-nourished. No distress.  HENT:  Head: Normocephalic.  Eyes: Conjunctivae are normal. Pupils are equal, round, and reactive to light. No scleral icterus.  Neck: Normal range of motion. Neck supple. No thyromegaly present.  Cardiovascular: Normal rate and regular rhythm.  Exam reveals no gallop and no friction rub.   No murmur heard. Resting heart rate 103. Sinus tach on the monitor.  Pulmonary/Chest: Effort normal and breath sounds normal. No respiratory distress. She has no wheezes. She has no rales.    Abdominal: Soft. Bowel sounds are normal. She exhibits no distension. There is no tenderness. There is no rebound.  Musculoskeletal: Normal range of  motion.  Neurological: She is alert and oriented to person, place, and time.  Skin: Skin is warm and dry. No rash noted.  Trace symmetric bilateral lower extremity edema.  Psychiatric: She has a normal mood and affect. Her behavior is normal.    ED Course  Procedures (including critical care time) Labs Review Labs Reviewed  CBC WITH DIFFERENTIAL - Abnormal; Notable for the following:    RBC 3.54 (*)    Hemoglobin 11.1 (*)    HCT 32.4 (*)    All other components within normal limits  BASIC METABOLIC PANEL - Abnormal; Notable for the following:    Potassium 3.1 (*)    Glucose, Bld 149 (*)    All other components within normal limits  I-STAT TROPOININ, ED    Imaging Review Ct Angio Chest Pe W/cm &/or Wo Cm  05/06/2014   CLINICAL DATA:  Chest pain.  EXAM: CT ANGIOGRAPHY CHEST WITH CONTRAST  TECHNIQUE: Multidetector CT imaging of the chest was performed using the standard protocol during bolus administration of intravenous contrast. Multiplanar CT image reconstructions and MIPs were obtained to evaluate the vascular anatomy.  CONTRAST:  167mL OMNIPAQUE IOHEXOL 350 MG/ML SOLN  COMPARISON:  None.  FINDINGS: Mild left pleural effusion is noted with adjacent subsegmental atelectasis. Moderate right pleural effusion is noted with adjacent subsegmental atelectasis. No pneumothorax is noted. There is no evidence of pulmonary embolus. Thoracic aorta appears normal. No mediastinal mass or adenopathy is noted. Visualized portion of upper abdomen appears normal. No definite osseous abnormality is noted.  Review of the MIP images confirms the above findings.  IMPRESSION: No evidence of pulmonary embolus. Bilateral pleural effusions are noted with adjacent subsegmental atelectasis, with right much larger than the left.   Electronically Signed   By: Sabino Dick M.D.   On: 05/06/2014 09:21     EKG Interpretation   Date/Time:  Wednesday May 06 2014 07:58:21 EDT Ventricular Rate:  84 PR Interval:   158 QRS Duration: 85 QT Interval:  368 QTC Calculation: 435 R Axis:   62 Text Interpretation:  Sinus rhythm Confirmed by Jeneen Rinks  MD, [place] ([id number]) on  05/06/2014 8:03:53 AM      MDM   Final diagnoses:  Pleural effusion  Atelectasis    Sessions. On CT patient has no sign of pulmonary embolus. She does have atelectasis at the bilateral bases and bilateral pleural effusions. No crepitus or count. Not febrile. Not hypoxemic. However atelectasis versus pneumonia per my read. Effusions likely secondary to fluid resuscitation during her episodes of hypotension perioperatively. She is appropriate for outpatient treatment. She feels well. Her friend that accompanies her here as a PA. Have asked her to have a repeat chest x-ray between 2 and 3 weeks to ensure that his changes have resolved. We'll cover her with Levaquin. She has a flutter valve, and an incentive spirometer home. Astra exercise and do pulmonary toilet. Also low-dose a.m. Lasix and potassium.    Tanna Furry, MD 05/06/14 1002

## 2014-05-07 ENCOUNTER — Telehealth (INDEPENDENT_AMBULATORY_CARE_PROVIDER_SITE_OTHER): Payer: Self-pay

## 2014-05-07 DIAGNOSIS — J90 Pleural effusion, not elsewhere classified: Secondary | ICD-10-CM

## 2014-05-07 NOTE — Telephone Encounter (Signed)
Called pt to notify her that Dr Donne Hazel wants her to get a u/s guided thoracentisis on 10/2 along with a BMET. Dr Donne Hazel also wants to see her in the office after this is complete. I have scheduled her for [facility] arrive at 12:15 to the lab for the BMET then she will need to go to 1st floor radiology for 1:15 to get the thoracentisis. Pt will then need to come to see Dr Donne Hazel when she is done with the test at [facility]. I made her an appt for 2:45 b/c radiology said her test would take an hour. I advised pt to just to come to the office when she is done at the hospital b/c Dr Donne Hazel wants to see her when test is complete. Pt understands.

## 2014-05-08 ENCOUNTER — Ambulatory Visit (HOSPITAL_COMMUNITY)
Admission: RE | Admit: 2014-05-08 | Discharge: 2014-05-08 | Disposition: A | Discharge status: Home / Self Care | Payer: 59 | Source: Ambulatory Visit | Attending: General Surgery | Admitting: General Surgery

## 2014-05-08 ENCOUNTER — Ambulatory Visit (HOSPITAL_COMMUNITY)
Admission: RE | Admit: 2014-05-08 | Discharge: 2014-05-08 | Disposition: A | Discharge status: Home / Self Care | Payer: 59 | Source: Ambulatory Visit | Attending: Radiology | Admitting: Radiology

## 2014-05-08 DIAGNOSIS — J90 Pleural effusion, not elsewhere classified: Secondary | ICD-10-CM | POA: Diagnosis not present

## 2014-05-08 LAB — BASIC METABOLIC PANEL
Anion gap: 13 (ref 5–15)
BUN: 10 mg/dL (ref 6–23)
CO2: 27 mEq/L (ref 19–32)
Calcium: 9.5 mg/dL (ref 8.4–10.5)
Chloride: 98 mEq/L (ref 96–112)
Creatinine, Ser: 0.74 mg/dL (ref 0.50–1.10)
GFR calc Af Amer: >90 mL/min (ref >90)
GFR calc non Af Amer: 90 mL/min — ABNORMAL LOW (ref >90)
Glucose, Bld: 112 mg/dL — ABNORMAL HIGH (ref 70–99)
Potassium: 3.8 mEq/L (ref 3.7–5.3)
Sodium: 138 mEq/L (ref 137–147)

## 2014-05-08 MED ORDER — LIDOCAINE HCL (PF) 1 % IJ SOLN
INTRAMUSCULAR | Status: AC
  Filled 2014-05-08: qty 10

## 2014-05-08 NOTE — Procedures (Signed)
Successful US guided right thoracentesis. Yielded 500 ml of blood tinged fluid. Pt tolerated procedure well. No immediate complications.  Specimen was not sent for labs. CXR ordered.  Tsosie Billing D PA-C 05/08/2014 2:17 PM

## 2014-05-14 ENCOUNTER — Ambulatory Visit: Payer: 59 | Admitting: Family Medicine

## 2014-05-27 ENCOUNTER — Encounter: Payer: Self-pay | Admitting: Family Medicine

## 2014-05-28 ENCOUNTER — Encounter: Payer: Self-pay | Admitting: Obstetrics & Gynecology

## 2014-05-29 ENCOUNTER — Ambulatory Visit (INDEPENDENT_AMBULATORY_CARE_PROVIDER_SITE_OTHER): Payer: 59 | Admitting: Obstetrics & Gynecology

## 2014-05-29 ENCOUNTER — Encounter: Payer: Self-pay | Admitting: Obstetrics & Gynecology

## 2014-05-29 VITALS — BP 102/60 | HR 64 | Resp 16 | Ht 66.0 in | Wt 151.6 lb

## 2014-05-29 DIAGNOSIS — Z0000 Encounter for general adult medical examination without abnormal findings: Secondary | ICD-10-CM

## 2014-05-29 LAB — POCT URINALYSIS DIPSTICK
Bilirubin, UA: NEGATIVE
Blood, UA: NEGATIVE
Glucose, UA: NEGATIVE
Ketones, UA: NEGATIVE
Leukocytes, UA: NEGATIVE
Nitrite, UA: NEGATIVE
Protein, UA: NEGATIVE
Urobilinogen, UA: NEGATIVE
pH, UA: 6

## 2014-05-29 MED ORDER — ESZOPICLONE 3 MG PO TABS: 3.0000 mg | ORAL_TABLET | Freq: Every evening | ORAL | Status: DC | PRN

## 2014-05-29 NOTE — Progress Notes (Signed)
63 y.o. G7P2 SingleCaucasianF here for annual exam.  Pt with recent hx of perforated colon with colonoscopy done by Dr. Earlean Shawl.  Had subsequent x lap with temporary colostomy placed.  Had wound vac.  Not healing by secondary intention.  Doing wet to try dressing changes.  Pt declines pelvic exam.  Very tearful and upset by entire process.  Recognizes my blessings along the way but is struggling with the way the scar looks and having to undergo this again due to colostomy take down.  Isn't going to do this until after her daughter gets married next spring.  Seeing Dr. Donne Hazel, who she likes very much, but he told her he wouldn't close the wound primarily and she just dreads having to go through this again.  Really just wants to get my opinion. Does have to have a repeat colonoscopy before this will be done.  Dreads this too.  Struggling with sleep.  On Lunesta 2mg .  Having trouble with insurance giving her more than 20 a month.    Patient's last menstrual period was 08/07/1998.          Sexually active: No.  The current method of family planning is status post hysterectomy.    Exercising: No.  not regularly Smoker:  no  Health Maintenance: Pap:  ? 2009 History of abnormal Pap:  no MMG:  06/18/13-normal Colonoscopy:  9/15-perforated colon-has colostomy, will try reversal in 4/16 BMD:   6/10 TDaP:  2009 Screening Labs: recent, Hb today: recent, Urine today: negative  Exam:   BP 102/60  Pulse 64  Resp 16  Ht 5\' 6"  (1.676 m)  Wt 151 lb 9.6 oz (68.765 kg)  BMI 24.48 kg/m2  LMP 08/07/1998    General appearance: alert, cooperative and appears stated age Breasts: normal appearance, no masses or tenderness Heart: regular rate and rhythm Abdomen: soft, non-tender; bowel sounds normal; no masses,  no organomegaly, wound healing very well, dressings reapplied, stoma healthy and pink, stool in bag  A  Normal breast exam H/O colon perforation with colonoscopy, now with colostomy and midline  incision healing by secondary intention  Insomnia   I do not feel pt has worsenign depression and that her reaction to situation is completely appropriate  P:   Lunesta 3mg  qhs prn.  #30/RF   MMG next year  Will plan full exam next year  An After Visit Summary was printed and given to the patient.

## 2014-06-02 ENCOUNTER — Telehealth: Payer: Self-pay

## 2014-06-02 ENCOUNTER — Encounter: Payer: Self-pay | Admitting: *Deleted

## 2014-06-02 NOTE — Telephone Encounter (Signed)
Pt states something has showed up on "[place]" that isn't accurate and she would like it taken off. Please call [phone]

## 2014-06-02 NOTE — Telephone Encounter (Signed)
Contacted pt via mychart for information.

## 2014-06-04 ENCOUNTER — Telehealth: Payer: Self-pay

## 2014-06-04 NOTE — Telephone Encounter (Signed)
Spoke to pt- She had marked in her history Diabetes. Pt has never had diabetes. I have removed this from her history. Pt is going to review once she is able and let us know if this has taken it off her mychart account.

## 2014-06-04 NOTE — Telephone Encounter (Signed)
Patient called. States somewhere in her record is a misdiagnosis and she wants to see about getting this removed from her record. Please return call and advise. CB # [phone]

## 2014-06-11 ENCOUNTER — Other Ambulatory Visit: Payer: Self-pay | Admitting: Family Medicine

## 2014-06-15 NOTE — Telephone Encounter (Signed)
Called pt bc it looks like her GYN wrote this for her at 05/29/14 OV w/RFs. She advised that she does have the Rx from her GYN and does not need the RF from [place]. I will deny req.

## 2014-07-20 ENCOUNTER — Telehealth (INDEPENDENT_AMBULATORY_CARE_PROVIDER_SITE_OTHER): Payer: Self-pay

## 2014-07-20 ENCOUNTER — Telehealth: Payer: Self-pay

## 2014-07-20 DIAGNOSIS — K631 Perforation of intestine (nontraumatic): Secondary | ICD-10-CM

## 2014-07-20 NOTE — Telephone Encounter (Signed)
-----   Message from Milus Banister, MD sent at 07/20/2014  7:28 AM EST ----- Catalina Antigua, I'm happy to see her.  I'll send word to the office to get her in.  Thanks   Beckhem Isadore, See below, I am happy to see her as a new patient. Can you help with NGI appt, next available?  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  She saw Medoff and was also due for colonoscopy.started with colonoscopy and perforated her sigmoid. Stopped there.  Never got completed colonoscopy or her egd.  I took her sigmoid out.  She had perforation and then a lot of serosal tears in her sigmoid, was ill also  Couldn't reconnect her.  She is very depressed right now. I told her what was involved with stoma takedown and told her she needed a second opinion from another surgeon.  She also needs completion colonoscopy and wants an egd.  I want to send her over to see you but I don't think that they will make appt until you accept as she has seen Medoff before.  She does not want to see him again.  Thanks, [facility]

## 2014-07-20 NOTE — Telephone Encounter (Signed)
The pt has been scheduled to see Dr Ardis Hughs on 08/04/14 2 pm

## 2014-07-20 NOTE — Telephone Encounter (Signed)
Pt seen in office on Friday by Dr Donne Hazel and orders placed in epic for GI referral to Dr Oretha Caprice.

## 2014-08-04 ENCOUNTER — Ambulatory Visit (INDEPENDENT_AMBULATORY_CARE_PROVIDER_SITE_OTHER): Payer: 59 | Admitting: Gastroenterology

## 2014-08-04 ENCOUNTER — Encounter: Payer: Self-pay | Admitting: Gastroenterology

## 2014-08-04 VITALS — BP 110/70 | HR 64 | Ht 66.0 in | Wt 159.0 lb

## 2014-08-04 DIAGNOSIS — K219 Gastro-esophageal reflux disease without esophagitis: Secondary | ICD-10-CM

## 2014-08-04 DIAGNOSIS — Z1211 Encounter for screening for malignant neoplasm of colon: Secondary | ICD-10-CM

## 2014-08-04 MED ORDER — MOVIPREP 100 G PO SOLR: 1.0000 | Freq: Once | ORAL | Status: DC

## 2014-08-04 NOTE — Patient Instructions (Addendum)
We will get records sent from your previous gastroenterologist Dr. Earlean Shawl for review.  This will include any endoscopic (colonoscopy or upper endoscopy) procedures and any associated pathology reports.   You will be set up for a colonoscopy via ostomy and via anus for colon cancer screening. You will be set up for an upper endoscopy for GERD, FH of esophageal cancer.

## 2014-08-04 NOTE — Progress Notes (Signed)
HPI: This is a very pleasant 63 year old woman whom I am meeting for the first time today.    Behavioral health RN.  Colonoscopy 2006 [place], normal no polyps. Per patient. Colonoscopy 2015 Dr. Earlean Shawl 04/2014.  C/B colon perforation.  Her sister has esophageal cancer, metastatic.   She has chronic GERD, pyrosis.  She believes there may have been Barrett's.  She has chronic GERD.  Currently on prilosec one pill once daily with good control of her symptoms.  No dysphagia.   Following the perforation she had segmental resection, colostomy, required wound VAC while in hospital  She is very interested in having her colostomy taken down. She is getting a second opinion about the nature of that surgery.  Review of systems: Pertinent positive and negative review of systems were noted in the above HPI section. Complete review of systems was performed and was otherwise normal.    Past Medical History  Diagnosis Date  . Depression   . GERD (gastroesophageal reflux disease)   . Hyperlipidemia   . Anxiety   . Vitamin D deficiency   . Suicide attempt   . Insomnia   . Perforated sigmoid colon 04/28/14    during colonoscopy, became septic, has colostomy and open surgical wound    Past Surgical History  Procedure Laterality Date  . Abdominal hysterectomy  1999    TAH/BSO  . Elbow surgery  2007  . Ulnar tunnel release  2010  . Blepharoplasty  2010  . Cosmetic surgery    . Eye surgery    . Debridement tennis elbow    . Breast surgery  2001    Breast reduction  . Dilation and curettage of uterus      x3, hysteroscopy  . Laparotomy N/A 04/28/2014    Procedure: EXPLORATORY LAPAROTOMY,SIGMOID COLECTOMY;  Surgeon: Rolm Bookbinder, MD;  Location: [facility];  Service: General;  Laterality: N/A;  . Colostomy N/A 04/28/2014    Procedure: COLOSTOMY;  Surgeon: Rolm Bookbinder, MD;  Location: [facility];  Service: General;  Laterality: N/A;  . Application of wound vac  04/28/2014    Procedure: APPLICATION  OF WOUND VAC;  Surgeon: Rolm Bookbinder, MD;  Location: [facility];  Service: General;;  . Colostomy revision N/A 04/28/2014    Procedure: COLON RESECTION SIGMOID;  Surgeon: Rolm Bookbinder, MD;  Location: [facility];  Service: General;  Laterality: N/A;    Current Outpatient Prescriptions  Medication Sig Dispense Refill  . eszopiclone 3 MG TABS Take 1 tablet (3 mg total) by mouth at bedtime as needed for sleep. Take immediately before bedtime 30 tablet 2  . omeprazole (PRILOSEC) 10 MG capsule Take 10 mg by mouth daily.    . ranitidine (ZANTAC) 150 MG tablet Take 150 mg by mouth as needed for heartburn.     No current facility-administered medications for this visit.    Allergies as of 08/04/2014 - Review Complete 08/04/2014  Allergen Reaction Noted  . Bee venom  05/06/2013  . Statins Other (See Comments) 10/18/2012    Family History  Problem Relation Age of Onset  . Hyperlipidemia Father   . Hypertension Father   . Diabetes Father   . Cancer Father     History of bladder cancer  . Hypertension Mother   . Diabetes Mother   . Heart disease Maternal Grandmother   . Stroke Maternal Grandfather   . Hyperlipidemia Brother   . Cancer Paternal Grandfather     pancreatic cancer  . Cancer Paternal Grandmother     uterine  .  Heart disease Father   . Esophageal cancer Sister     History   Social History  . Marital Status: Single    Spouse Name: N/A    Number of Children: N/A  . Years of Education: N/A   Occupational History  . Not on file.   Social History Main Topics  . Smoking status: Former Research scientist (life sciences)  . Smokeless tobacco: Never Used  . Alcohol Use: 1.0 oz/week    2 drink(s) per week  . Drug Use: No  . Sexual Activity: No     Comment: TAH/BSO   Other Topics Concern  . Not on file   Social History Narrative   College Degree   Registered Nurse   Exercises             Physical Exam: BP 110/70 mmHg  Pulse 64  Ht 5\' 6"  (1.676 m)  Wt 159 lb (72.122 kg)  BMI 25.68  kg/m2  LMP 08/07/1998 Constitutional: generally well-appearing Psychiatric: alert and oriented x3 Eyes: extraocular movements intact Mouth: oral pharynx moist, no lesions Neck: supple no lymphadenopathy Cardiovascular: heart regular rate and rhythm Lungs: clear to auscultation bilaterally Abdomen: soft, nontender, nondistended, no obvious ascites, no peritoneal signs, normal bowel sounds Extremities: no lower extremity edema bilaterally Skin: no lesions on visible extremities    Assessment and plan: 63 y.o. female with  routine risk for colon cancer, chronic GERD, family history of esophageal cancer  She suffered colon perforation during colonoscopy about 3 months ago. Prior to takedown she needs full colon examination which I'm happy to do for her. She does understand that there is still risk of complication during colonoscopy such as perforation, bleeding, missed lesions. We will schedule her for colonoscopy via left-sided colostomy and also via anus at her soonest convenience. She has chronic GERD, perhaps a bit worse lately. Her sister was diagnosed with metastatic esophageal cancer about a year ago and I would like to proceed with EGD at the same time as her colonoscopy.  We will get records from her previous colonoscopy from Dr. Liliane Channel office.

## 2014-08-05 ENCOUNTER — Other Ambulatory Visit (INDEPENDENT_AMBULATORY_CARE_PROVIDER_SITE_OTHER): Payer: Self-pay | Admitting: General Surgery

## 2014-08-05 NOTE — H&P (Signed)
[name] 08/05/2014 11:05 AM Location: [facility] Patient #: [id number] DOB: 14-Mar-1951 Single / Language: [name] / Race: White Female History of Present Illness Jo Ruff MD; 16/60/6301 12:07 PM) Patient words: reck.  The patient is a 63 year old female who presents with a complaint of colostomy reversal. Patient is here for discussion of closure of her colostomy. She underwent an emergent sigmoid colectomy and colostomy with Hartman's procedure for a distal sigmoid tear that was noted after colonoscopy. She reports that she is not having any major difficulties with the colostomy but does have some pouching issues. She denies any abdominal pain. She is having regular bowel movements. Her biggest concern is her abdominal scar. Over the last year she hired a Clinical research associate to help with her appearance. She is mostly depressed that now she has multiple scars on her abdomen disfiguring it. I have reviewed my partner's note regarding her operation. It appears that she had a tear that transversed the rectosigmoid junction. It sounds like the colon was transected at this point. The splenic flexure was mobilized during the operation as well. Other Problems [name] [name], [place]; 08/05/2014 11:26 AM) POST-OPERATIVE STATE [phone]) Gastroesophageal Reflux Disease Hypercholesterolemia Oophorectomy Bilateral. Anxiety Disorder  Past Surgical History Jo Buffy, MA; 08/05/2014 11:26 AM) Mammoplasty; Reduction Bilateral. Oral Surgery Tonsillectomy Hysterectomy (not due to cancer) - Complete  Diagnostic Studies History [name] [name], [place]; 08/05/2014 11:26 AM) Colonoscopy within last year Mammogram 1-3 years ago Pap Smear 1-5 years ago  Allergies [name], CMA; 08/05/2014 11:06 AM) Statins Depletion *DIETARY PRODUCTS/DIETARY MANAGEMENT PRODUCTS* [name]  Medication History [name], CMA;  08/05/2014 11:06 AM) [name] (2MG  Tablet, Oral) Active. Lasix (20MG  Tablet, Oral) Active. Levaquin (500MG  Tablet, Oral) Active. K-Dur (10MEQ Tablet ER, Oral) Active. Omeprazole (20MG  Tablet DR, Oral) Active.  Social History [name] [place], [place]; 08/05/2014 11:26 AM) Tobacco use Former smoker. Alcohol use Occasional alcohol use. Caffeine use Carbonated beverages, Coffee, Tea. No drug use  Family History [name] [place], [place]; 08/05/2014 11:26 AM) Cancer Father, Sister. Depression Brother, Mother, Sister. Heart Disease Father. Heart disease in female family member before age 33  Pregnancy / Birth History [name] [place], [place]; 08/05/2014 11:26 AM) Age at menarche 65 years. Age of menopause <46 Gravida 7 Irregular periods Maternal age 25-30 Para 2    Vitals ([name]; 08/05/2014 11:06 AM) 08/05/2014 11:05 AM Weight: 156 lb Height: 66in Body Surface Area: 1.82 m Body Mass Index: 25.18 kg/m Temp.: 25F(Temporal)  Pulse: 79 (Regular)  BP: 122/80 (Sitting, Left Arm, Standard)     Physical Exam Jo Ruff MD; 73/22/0254 12:14 PM)  General Mental Status-Alert. General Appearance-Consistent with stated age. Hydration-Well hydrated. Voice-Normal.  Head and Neck Head-normocephalic, atraumatic with no lesions or palpable masses. Trachea-midline. Thyroid Gland Characteristics - normal size and consistency.  Eye Eyeball - Bilateral-Extraocular movements intact. Sclera/Conjunctiva - Bilateral-No scleral icterus.  Chest and Lung Exam Chest and lung exam reveals -quiet, even and easy respiratory effort with no use of accessory muscles and on auscultation, normal breath sounds, no adventitious sounds and normal vocal resonance. Inspection Chest Wall - Normal. Back - normal.  Cardiovascular Cardiovascular examination reveals -normal heart sounds, regular rate and rhythm with no murmurs and normal pedal pulses  bilaterally.  Abdomen Inspection Inspection of the abdomen reveals - No Hernias. Skin - Scar - Note: Midline abdominal scar with a raised portion in the right periumbilical region. Colostomy and placed periumbilically on the left side. Palpation/Percussion Palpation and Percussion  of the abdomen reveal - Soft, Non Tender, No Rebound tenderness, No Rigidity (guarding) and No hepatosplenomegaly. Auscultation Auscultation of the abdomen reveals - Bowel sounds normal.  Neurologic Neurologic evaluation reveals -alert and oriented x 3 with no impairment of recent or remote memory. Mental Status-Normal.  Musculoskeletal Global Assessment -Note:no gross deformities.  Normal Exam - Left-Upper Extremity Strength Normal and Lower Extremity Strength Normal. Normal Exam - Right-Upper Extremity Strength Normal and Lower Extremity Strength Normal.    Assessment & Plan Jo Ruff MD; 33/83/2919 12:20 PM)  COLOSTOMY IN PLACE (V44.3  Z93.3) Story: Patient is approximately 3 months status post emergent colectomy and Hartman's procedure for colonic perforation after colonoscopy. She is scheduled to undergo a completion colonoscopy with Dr. Ardis Hughs later this month. After this it appears she will be ready for reversal. Impression: We discussed ways to perform a colostomy reversal and meet her goals of treatment. She is mostly concerned of her surgical scar and a portion of the scar which is raised towards the right of her umbilicus. She would like to avoid as many new scars as possible. She would also like to avoid reopening the midline incision if possible. I think this is reasonable to do laparoscopically and use 5 mm trochars for minimal scarring outside of her midline wound. We discussed that we would leave the colostomy partially open to allow for drainage. We discussed that if it became unsafe to continue laparoscopically we would reopen the scar at that time. She is in agreement to this.  She has decided to wait and see how the scar evens out over the next year and possibly consult a plastic surgeon for revision at that time.  The surgery and anatomy were described to the patient as well as the risks of surgery and the possible complications. These include: Bleeding, deep abdominal infections and possible wound complications such as hernia and infection, damage to adjacent structures, leak of surgical connections, which can lead to other surgeries and possibly an ostomy, possible need for other procedures, such as abscess drains in radiology, possible prolonged hospital stay, possible diarrhea from removal of part of the colon, possible constipation from narcotics, possible bowel, bladder or sexual dysfunction if having rectal surgery, prolonged fatigue/weakness or appetite loss, possible early recurrence of of disease, possible complications of their medical problems such as heart disease or arrhythmias or lung problems, death (less than 1%). I believe the patient understands and wishes to proceed with the surgery.  Current Plans Pt Education - [facility] Bowel Prep Started Neomycin Sulfate 500MG , 2 (two) Tablet SEE NOTE, #6, 08/05/2014, No Refill. Local Order: TAKE TWO TABLETS AT 2 PM, 3 PM, AND 10 PM THE DAY PRIOR TO SURGERY Started Flagyl 500MG , 2 (two) Tablet SEE NOTE, #6, 08/05/2014, No Refill. Local Order: Take at 2pm, 3pm, and 10pm the day prior to your colon operation

## 2014-08-14 ENCOUNTER — Telehealth: Payer: Self-pay | Admitting: Gastroenterology

## 2014-08-14 NOTE — Telephone Encounter (Signed)
Pt aware that the pharmacy has been called and given info for a free movi prep.  She will go back and pick that up

## 2014-08-18 ENCOUNTER — Ambulatory Visit (AMBULATORY_SURGERY_CENTER): Payer: 59 | Admitting: Gastroenterology

## 2014-08-18 ENCOUNTER — Encounter: Payer: Self-pay | Admitting: Gastroenterology

## 2014-08-18 VITALS — BP 125/73 | HR 68 | Temp 98.2°F | Resp 33 | Ht 66.0 in | Wt 159.0 lb

## 2014-08-18 DIAGNOSIS — D124 Benign neoplasm of descending colon: Secondary | ICD-10-CM

## 2014-08-18 DIAGNOSIS — K219 Gastro-esophageal reflux disease without esophagitis: Secondary | ICD-10-CM

## 2014-08-18 DIAGNOSIS — D122 Benign neoplasm of ascending colon: Secondary | ICD-10-CM

## 2014-08-18 DIAGNOSIS — K635 Polyp of colon: Secondary | ICD-10-CM

## 2014-08-18 DIAGNOSIS — D123 Benign neoplasm of transverse colon: Secondary | ICD-10-CM

## 2014-08-18 DIAGNOSIS — K209 Esophagitis, unspecified without bleeding: Secondary | ICD-10-CM

## 2014-08-18 DIAGNOSIS — Z1211 Encounter for screening for malignant neoplasm of colon: Secondary | ICD-10-CM

## 2014-08-18 MED ORDER — ESOMEPRAZOLE MAGNESIUM 40 MG PO PACK: 40.0000 mg | PACK | Freq: Two times a day (BID) | ORAL | Status: DC

## 2014-08-18 MED ORDER — SODIUM CHLORIDE 0.9 % IV SOLN: 500.0000 mL | INTRAVENOUS | Status: DC

## 2014-08-18 NOTE — Progress Notes (Signed)
Called to room to assist during endoscopic procedure.  Patient ID and intended procedure confirmed with present staff. Received instructions for my participation in the procedure from the performing physician.  

## 2014-08-18 NOTE — Patient Instructions (Signed)
YOU HAD AN ENDOSCOPIC PROCEDURE TODAY AT THE [facility]: Refer to the procedure report that was given to you for any specific questions about what was found during the examination.  If the procedure report does not answer your questions, please call your gastroenterologist to clarify.  If you requested that your care partner not be given the details of your procedure findings, then the procedure report has been included in a sealed envelope for you to review at your convenience later.  YOU SHOULD EXPECT: Some feelings of bloating in the abdomen. Passage of more gas than usual.  Walking can help get rid of the air that was put into your GI tract during the procedure and reduce the bloating. If you had a lower endoscopy (such as a colonoscopy or flexible sigmoidoscopy) you may notice spotting of blood in your stool or on the toilet paper. If you underwent a bowel prep for your procedure, then you may not have a normal bowel movement for a few days.  DIET: Your first meal following the procedure should be a light meal and then it is ok to progress to your normal diet.  A half-sandwich or bowl of soup is an example of a good first meal.  Heavy or fried foods are harder to digest and may make you feel nauseous or bloated.  Likewise meals heavy in dairy and vegetables can cause extra gas to form and this can also increase the bloating.  Drink plenty of fluids but you should avoid alcoholic beverages for 24 hours.  ACTIVITY: Your care partner should take you home directly after the procedure.  You should plan to take it easy, moving slowly for the rest of the day.  You can resume normal activity the day after the procedure however you should NOT DRIVE or use heavy machinery for 24 hours (because of the sedation medicines used during the test).    SYMPTOMS TO REPORT IMMEDIATELY: A gastroenterologist can be reached at any hour.  During normal business hours, 8:30 AM to 5:00 PM Monday through Friday,  call [phone].  After hours and on weekends, please call the GI answering service at [phone] who will take a message and have the physician on call contact you.   Following lower endoscopy (colonoscopy or flexible sigmoidoscopy):  Excessive amounts of blood in the stool  Significant tenderness or worsening of abdominal pains  Swelling of the abdomen that is new, acute  Fever of 100F or higher  Following upper endoscopy (EGD)  Vomiting of blood or coffee ground material  New chest pain or pain under the shoulder blades  Painful or persistently difficult swallowing  New shortness of breath  Fever of 100F or higher  Black, tarry-looking stools  FOLLOW UP: If any biopsies were taken you will be contacted by phone or by letter within the next 1-3 weeks.  Call your gastroenterologist if you have not heard about the biopsies in 3 weeks.  Our staff will call the home number listed on your records the next business day following your procedure to check on you and address any questions or concerns that you may have at that time regarding the information given to you following your procedure. This is a courtesy call and so if there is no answer at the home number and we have not heard from you through the emergency physician on call, we will assume that you have returned to your regular daily activities without incident.  SIGNATURES/CONFIDENTIALITY: You and/or your care   partner have signed paperwork which will be entered into your electronic medical record.  These signatures attest to the fact that that the information above on your After Visit Summary has been reviewed and is understood.  Full responsibility of the confidentiality of this discharge information lies with you and/or your care-partner.  Await pathology  Continue your medication  Dr. Ardis Hughs prescribed Nexium- take 1 tablet 30 minutes before breakfast and 1 tablet 30 minutes before supper  Please read over handouts  about polyps and hiatal hernia

## 2014-08-18 NOTE — Progress Notes (Signed)
Emotional pre- procedure  IV sedation provided comfort To RR stable

## 2014-08-18 NOTE — Op Note (Signed)
[place]  [address] [place], [address]   ENDOSCOPY PROCEDURE REPORT  PATIENT: [name], [name]  MR#: [id number] BIRTHDATE: 02/21/1951 , 63  yrs. old GENDER: female ENDOSCOPIST: Milus Banister, MD PROCEDURE DATE:  08/18/2014 PROCEDURE:  EGD w/ biopsy ASA CLASS:     Class II INDICATIONS:  chronic GERD, sister with esophageal cancer. MEDICATIONS: Monitored anesthesia care and Propofol 100 mg IV TOPICAL ANESTHETIC: none  DESCRIPTION OF PROCEDURE: After the risks benefits and alternatives of the procedure were thoroughly explained, informed consent was obtained.  The LB [id number] endoscope was introduced through the mouth and advanced to the second portion of the duodenum , Without limitations.  The instrument was slowly withdrawn as the mucosa was fully examined.   There was typical appearing, reflux related, short segment ulcerative esophagitis.  This was biospied and sent to pathology. There was a 3cm hiatal hernia.  The examination was otherwise normal.  Retroflexed views revealed no abnormalities.     The scope was then withdrawn from the patient and the procedure completed.  COMPLICATIONS: There were no immediate complications.  ENDOSCOPIC IMPRESSION: There was typical appearing, reflux related, short segment ulcerative esophagitis.  This was biospied and sent to pathology. There was a 3cm hiatal hernia.  The examination was otherwise normal  RECOMMENDATIONS: Given the ulcerative esophagitis, you need to increase your anti-acid coverage.  Will prescribe twice daily PPI (one pill 20-30 min prior to BF and dinner meals). Await final pathology.   eSigned:  Milus Banister, MD 08/18/2014 48:88 AM    CC: [name], MD; Serita Grammes, MD

## 2014-08-18 NOTE — Op Note (Signed)
[place]  [address]. [place], [address]   COLONOSCOPY PROCEDURE REPORT  PATIENT: Journie, Howson  MR#: [id number] BIRTHDATE: [birth year] , 63  yrs. old GENDER: female ENDOSCOPIST: Milus Banister, MD REFERRED BL:[id number] Donne Hazel, M.D. PROCEDURE DATE:  08/18/2014 PROCEDURE:   Colonoscopy with snare polypectomy First Screening Colonoscopy - Avg.  risk and is 50 yrs.  old or older - No.  Prior Negative Screening - Now for repeat screening. N/A  History of Adenoma - Now for follow-up colonoscopy & has been > or = to 3 yrs.  N/A  Polyps Removed Today? Yes. ASA CLASS:   Class II INDICATIONS:colonoscopy Dr.  Earlean Shawl complicated by colon perforation, prolonged hospital stay. MEDICATIONS: Monitored anesthesia care, Propofol 300 mg IV, and lidocain 40mg  IV  DESCRIPTION OF PROCEDURE:   After the risks benefits and alternatives of the procedure were thoroughly explained, informed consent was obtained.  The digital rectal exam revealed no abnormalities of the rectum.   The LB [id number]  endoscope was introduced through the anus and advanced to the proximal end of the Hartman's pouch. Then the same scope was introduced into the ostomy site and advanced to the cecum, which was identified by both the appendix and ileocecal valve. No adverse events experienced. The quality of the prep was excellent.  The instrument was then slowly withdrawn as the colon was fully examined.  COLON FINDINGS: Via anus, the Hartman's pouch was evaluated.  This was 15cm long, completely normal appearing.  Via ostomy, the remaining colon was examined.  Two polyps were found, removed and sent to pathology.  These were both sessile, 2-48mm across, located in ascending and descending segments, removed with cold snare and sent to pathology.  The examination was otherwise normal. Retroflexed views revealed no abnormalities. The time to cecum=NA. Withdrawal time=NA.  The scope was withdrawn  and the procedure completed. COMPLICATIONS: There were no immediate complications.  ENDOSCOPIC IMPRESSION: Via anus, the Hartman's pouch was evaluated.  This was 15cm long, completely normal appearing. Via ostomy, the remaining colon was examined.  Two polyps were found, removed and sent to pathology. The examination was otherwise normal  RECOMMENDATIONS: 1. If the polyp(s) removed today are proven to be adenomatous (pre-cancerous) polyps, you will need a repeat colonoscopy in 5 years.  Otherwise you should continue to follow colorectal cancer screening guidelines for "routine risk" patients with colonoscopy in 10 years.  You will receive a letter within 1-2 weeks with the results of your biopsy as well as final recommendations.  Please call my office if you have not received a letter after 3 weeks. 2. OK to proceed with colostomy reversal from GI perspective.  eSigned:  Milus Banister, MD 08/18/2014 23:30 AM   cc: [name], MD; Serita Grammes, MD   PATIENT NAME:  Alessa, Mazur MR#: [id number]

## 2014-08-19 ENCOUNTER — Telehealth: Payer: Self-pay | Admitting: *Deleted

## 2014-08-19 NOTE — Telephone Encounter (Signed)
  Follow up [place] mess that we called for f/u  No flowsheet data found.

## 2014-08-26 NOTE — Patient Instructions (Signed)
[name]  08/26/2014   Your procedure is scheduled on: 09/02/14   Report to [facility] Main  Entrance and follow signs to               [place] at 9:30  AM.   Call this number if you have problems the morning of surgery [phone]   Remember:  Do not eat food or drink liquids :After Midnight.     Take these medicines the morning of surgery with A SIP OF WATER:                                You may not have any metal on your body including hair pins and              piercings  Do not wear jewelry, make-up, lotions, powders or perfumes.             Do not wear nail polish.  Do not shave  48 hours prior to surgery.              Men may shave face and neck.   Do not bring valuables to the hospital. [place].  Contacts, dentures or bridgework may not be worn into surgery.  Leave suitcase in the car. After surgery it may be brought to your room.     Patients discharged the day of surgery will not be allowed to drive home.  Name and phone number of your driver:  Special Instructions: N/A              Please read over the following fact sheets you were given: _____________________________________________________________________                                                     [place]  Before surgery, you can play an important role.  Because skin is not sterile, your skin needs to be as free of germs as possible.  You can reduce the number of germs on your skin by washing with CHG (chlorahexidine gluconate) soap before surgery.  CHG is an antiseptic cleaner which kills germs and bonds with the skin to continue killing germs even after washing. Please DO NOT use if you have an allergy to CHG or antibacterial soaps.  If your skin becomes reddened/irritated stop using the CHG and inform your nurse when you arrive at Short Stay. Do not shave (including legs and  underarms) for at least 48 hours prior to the first CHG shower.  You may shave your face. Please follow these instructions carefully:   1.  Shower with CHG Soap the night before surgery and the  morning of Surgery.   2.  If you choose to wash your hair, wash your hair first as usual with your  normal  Shampoo.   3.  After you shampoo, rinse your hair and body thoroughly to remove the  shampoo.  4.  Use CHG as you would any other liquid soap.  You can apply chg directly  to the skin and wash . Gently wash with scrungie or clean wascloth    5.  Apply the CHG Soap to your body ONLY FROM THE NECK DOWN.   Do not use on open                           Wound or open sores. Avoid contact with eyes, ears mouth and genitals (private parts).                        Genitals (private parts) with your normal soap.              6.  Wash thoroughly, paying special attention to the area where your surgery  will be performed.   7.  Thoroughly rinse your body with warm water from the neck down.   8.  DO NOT shower/wash with your normal soap after using and rinsing off  the CHG Soap .                9.  Pat yourself dry with a clean towel.             10.  Wear clean pajamas.             11.  Place clean sheets on your bed the night of your first shower and do not  sleep with pets.  Day of Surgery : Do not apply any lotions/deodorants the morning of surgery.  Please wear clean clothes to the hospital/surgery center.  FAILURE TO FOLLOW THESE INSTRUCTIONS MAY RESULT IN THE CANCELLATION OF YOUR SURGERY    PATIENT SIGNATURE_________________________________  ______________________________________________________________________

## 2014-08-28 ENCOUNTER — Encounter (HOSPITAL_COMMUNITY)
Admission: RE | Admit: 2014-08-28 | Discharge: 2014-08-28 | Disposition: A | Discharge status: Home / Self Care | Payer: 59 | Source: Ambulatory Visit

## 2014-08-30 ENCOUNTER — Encounter: Payer: Self-pay | Admitting: Gastroenterology

## 2014-08-31 ENCOUNTER — Encounter (HOSPITAL_COMMUNITY)
Admission: RE | Admit: 2014-08-31 | Discharge: 2014-08-31 | Disposition: A | Discharge status: Home / Self Care | Payer: 59 | Source: Ambulatory Visit | Attending: General Surgery | Admitting: General Surgery

## 2014-08-31 ENCOUNTER — Encounter (HOSPITAL_COMMUNITY): Payer: Self-pay

## 2014-08-31 HISTORY — DX: Adverse effect of unspecified anesthetic, initial encounter: T41.45XA

## 2014-08-31 HISTORY — DX: Other complications of anesthesia, initial encounter: T88.59XA

## 2014-08-31 LAB — BASIC METABOLIC PANEL
Anion gap: 8 (ref 5–15)
BUN: 13 mg/dL (ref 6–23)
CO2: 28 mmol/L (ref 19–32)
Calcium: 9.4 mg/dL (ref 8.4–10.5)
Chloride: 101 mmol/L (ref 96–112)
Creatinine, Ser: 0.8 mg/dL (ref 0.50–1.10)
GFR calc Af Amer: 90 mL/min — ABNORMAL LOW (ref >90)
GFR calc non Af Amer: 77 mL/min — ABNORMAL LOW (ref >90)
Glucose, Bld: 87 mg/dL (ref 70–99)
Potassium: 4.3 mmol/L (ref 3.5–5.1)
Sodium: 137 mmol/L (ref 135–145)

## 2014-08-31 LAB — HEMOGLOBIN A1C
Hgb A1c MFr Bld: 6 % — ABNORMAL HIGH (ref <5.7)
Mean Plasma Glucose: 126 mg/dL — ABNORMAL HIGH (ref <117)

## 2014-08-31 LAB — CBC
HCT: 38.9 % (ref 36.0–46.0)
Hemoglobin: 12.9 g/dL (ref 12.0–15.0)
MCH: 30.1 pg (ref 26.0–34.0)
MCHC: 33.2 g/dL (ref 30.0–36.0)
MCV: 90.7 fL (ref 78.0–100.0)
Platelets: 343 10*3/uL (ref 150–400)
RBC: 4.29 MIL/uL (ref 3.87–5.11)
RDW: 14.2 % (ref 11.5–15.5)
WBC: 8 10*3/uL (ref 4.0–10.5)

## 2014-08-31 NOTE — Patient Instructions (Addendum)
[place]  08/31/2014   Your procedure is scheduled on:   09-02-2014 Wednesday  Enter through [place] and follow signs to [facility]. Arrive at       0930 AM .  Call this number if you have problems the morning of surgery: [phone]  Or Presurgical Testing [phone].   For Living Will and/or Health Care Power Attorney Forms: please provide copy for your medical record,may bring AM of surgery(Forms should be already notarized -we do not provide this service).(08-31-14 yes, info provided today).  Remember: Follow any bowel prep instructions per MD office.    Do not eat food/ or drink: After Midnight.      Take these medicines the morning of surgery with A SIP OF WATER: Nexium.   Do not wear jewelry, make-up or nail polish.  Do not wear deodorant, lotions, powders, or perfumes.   Do not shave legs and under arms- 48 hours(2 days) prior to first CHG shower.(Shaving face and neck okay.)  Do not bring valuables to the hospital.(Hospital is not responsible for lost valuables).  Contacts, dentures or removable bridgework, body piercing, hair pins may not be worn into surgery.  Leave suitcase in the car. After surgery it may be brought to your room.  For patients admitted to the hospital, checkout time is 11:00 AM the day of discharge.(Restricted visitors-Any Persons displaying flu-like symptoms or illness).    Patients discharged the day of surgery will not be allowed to drive home. Must have responsible person with you x 24 hours once discharged.  Name and phone number of your driver: Donnielle PXTGGYIRS,[phone] cell     Please read over the following fact sheets that you were given:  CHG(Chlorhexidine Gluconate 4% Surgical Soap) use.  Remember : Type/Screen "Blue armbands" - may not be removed once applied(would result in being retested AM of surgery, if removed).         [place] - Preparing for Surgery Before surgery, you can play  an important role.  Because skin is not sterile, your skin needs to be as free of germs as possible.  You can reduce the number of germs on your skin by washing with CHG (chlorahexidine gluconate) soap before surgery.  CHG is an antiseptic cleaner which kills germs and bonds with the skin to continue killing germs even after washing. Please DO NOT use if you have an allergy to CHG or antibacterial soaps.  If your skin becomes reddened/irritated stop using the CHG and inform your nurse when you arrive at Short Stay. Do not shave (including legs and underarms) for at least 48 hours prior to the first CHG shower.  You may shave your face/neck. Please follow these instructions carefully:  1.  Shower with CHG Soap the night before surgery and the  morning of Surgery.  2.  If you choose to wash your hair, wash your hair first as usual with your  normal  shampoo.  3.  After you shampoo, rinse your hair and body thoroughly to remove the  shampoo.                           4.  Use CHG as you would any other liquid soap.  You can apply chg directly  to the skin and wash                       Gently with a scrungie  or clean washcloth.  5.  Apply the CHG Soap to your body ONLY FROM THE NECK DOWN.   Do not use on face/ open                           Wound or open sores. Avoid contact with eyes, ears mouth and genitals (private parts).                       Wash face,  Genitals (private parts) with your normal soap.             6.  Wash thoroughly, paying special attention to the area where your surgery  will be performed.  7.  Thoroughly rinse your body with warm water from the neck down.  8.  DO NOT shower/wash with your normal soap after using and rinsing off  the CHG Soap.                9.  Pat yourself dry with a clean towel.            10.  Wear clean pajamas.            11.  Place clean sheets on your bed the night of your first shower and do not  sleep with pets. Day of Surgery : Do not apply any  lotions/deodorants the morning of surgery.  Please wear clean clothes to the hospital/surgery center.  FAILURE TO FOLLOW THESE INSTRUCTIONS MAY RESULT IN THE CANCELLATION OF YOUR SURGERY PATIENT SIGNATURE_________________________________  NURSE SIGNATURE__________________________________  ________________________________________________________________________

## 2014-08-31 NOTE — Pre-Procedure Instructions (Signed)
08-31-14 EKG 9'15, CT Chest 9'15, CXR 1 view 10'15 Epic.

## 2014-09-02 ENCOUNTER — Inpatient Hospital Stay (HOSPITAL_COMMUNITY): Payer: 59 | Admitting: Certified Registered"

## 2014-09-02 ENCOUNTER — Encounter (HOSPITAL_COMMUNITY): Payer: Self-pay | Admitting: *Deleted

## 2014-09-02 ENCOUNTER — Encounter (HOSPITAL_COMMUNITY)
Admission: RE | Disposition: A | Discharge status: Home / Self Care | Payer: Self-pay | Source: Ambulatory Visit | Attending: General Surgery

## 2014-09-02 ENCOUNTER — Inpatient Hospital Stay (HOSPITAL_COMMUNITY)
Admission: RE | Admit: 2014-09-02 | Discharge: 2014-09-07 | DRG: 330 | Disposition: A | Discharge status: Home / Self Care | Payer: 59 | Source: Ambulatory Visit | Attending: General Surgery | Admitting: General Surgery

## 2014-09-02 DIAGNOSIS — Z91030 Bee allergy status: Secondary | ICD-10-CM | POA: Diagnosis not present

## 2014-09-02 DIAGNOSIS — N926 Irregular menstruation, unspecified: Secondary | ICD-10-CM | POA: Diagnosis present

## 2014-09-02 DIAGNOSIS — E785 Hyperlipidemia, unspecified: Secondary | ICD-10-CM | POA: Diagnosis present

## 2014-09-02 DIAGNOSIS — Z79899 Other long term (current) drug therapy: Secondary | ICD-10-CM

## 2014-09-02 DIAGNOSIS — Z8249 Family history of ischemic heart disease and other diseases of the circulatory system: Secondary | ICD-10-CM | POA: Diagnosis not present

## 2014-09-02 DIAGNOSIS — Z823 Family history of stroke: Secondary | ICD-10-CM | POA: Diagnosis not present

## 2014-09-02 DIAGNOSIS — F419 Anxiety disorder, unspecified: Secondary | ICD-10-CM | POA: Diagnosis present

## 2014-09-02 DIAGNOSIS — Z800 Family history of malignant neoplasm of digestive organs: Secondary | ICD-10-CM | POA: Diagnosis not present

## 2014-09-02 DIAGNOSIS — G4700 Insomnia, unspecified: Secondary | ICD-10-CM | POA: Diagnosis present

## 2014-09-02 DIAGNOSIS — K219 Gastro-esophageal reflux disease without esophagitis: Secondary | ICD-10-CM | POA: Diagnosis present

## 2014-09-02 DIAGNOSIS — Z833 Family history of diabetes mellitus: Secondary | ICD-10-CM | POA: Diagnosis not present

## 2014-09-02 DIAGNOSIS — Z01812 Encounter for preprocedural laboratory examination: Secondary | ICD-10-CM | POA: Diagnosis not present

## 2014-09-02 DIAGNOSIS — Z888 Allergy status to other drugs, medicaments and biological substances status: Secondary | ICD-10-CM | POA: Diagnosis not present

## 2014-09-02 DIAGNOSIS — K567 Ileus, unspecified: Secondary | ICD-10-CM | POA: Diagnosis not present

## 2014-09-02 DIAGNOSIS — Z433 Encounter for attention to colostomy: Secondary | ICD-10-CM | POA: Diagnosis present

## 2014-09-02 DIAGNOSIS — Z87891 Personal history of nicotine dependence: Secondary | ICD-10-CM | POA: Diagnosis not present

## 2014-09-02 DIAGNOSIS — E559 Vitamin D deficiency, unspecified: Secondary | ICD-10-CM | POA: Diagnosis present

## 2014-09-02 DIAGNOSIS — Z90710 Acquired absence of both cervix and uterus: Secondary | ICD-10-CM | POA: Diagnosis not present

## 2014-09-02 DIAGNOSIS — F329 Major depressive disorder, single episode, unspecified: Secondary | ICD-10-CM | POA: Diagnosis present

## 2014-09-02 DIAGNOSIS — E780 Pure hypercholesterolemia: Secondary | ICD-10-CM | POA: Diagnosis present

## 2014-09-02 DIAGNOSIS — Z933 Colostomy status: Secondary | ICD-10-CM

## 2014-09-02 HISTORY — PX: COLOSTOMY TAKEDOWN: SHX5258

## 2014-09-02 SURGERY — CLOSURE, COLOSTOMY, LAPAROSCOPIC
Anesthesia: General | Site: Abdomen

## 2014-09-02 MED ORDER — LACTATED RINGERS IV SOLN
INTRAVENOUS | Status: DC
  Administered 2014-09-02: 75 mL/h via INTRAVENOUS

## 2014-09-02 MED ORDER — HYDROMORPHONE HCL 1 MG/ML IJ SOLN
INTRAMUSCULAR | Status: DC | PRN
  Administered 2014-09-02 (×4): 0.5 mg via INTRAVENOUS

## 2014-09-02 MED ORDER — SUCCINYLCHOLINE CHLORIDE 20 MG/ML IJ SOLN
INTRAMUSCULAR | Status: DC | PRN
  Administered 2014-09-02: 100 mg via INTRAVENOUS

## 2014-09-02 MED ORDER — SODIUM CHLORIDE 0.9 % IJ SOLN: 9.0000 mL | INTRAMUSCULAR | Status: DC | PRN

## 2014-09-02 MED ORDER — ENOXAPARIN SODIUM 40 MG/0.4ML ~~LOC~~ SOLN
40.0000 mg | SUBCUTANEOUS | Status: DC
  Administered 2014-09-03 – 2014-09-06 (×4): 40 mg via SUBCUTANEOUS
  Filled 2014-09-02 (×6): qty 0.4

## 2014-09-02 MED ORDER — GLYCOPYRROLATE 0.2 MG/ML IJ SOLN
INTRAMUSCULAR | Status: DC | PRN
  Administered 2014-09-02: 0.6 mg via INTRAVENOUS

## 2014-09-02 MED ORDER — ACETAMINOPHEN 10 MG/ML IV SOLN
1000.0000 mg | Freq: Once | INTRAVENOUS | Status: AC
  Administered 2014-09-02: 1000 mg via INTRAVENOUS
  Filled 2014-09-02: qty 100

## 2014-09-02 MED ORDER — CEFOTETAN DISODIUM-DEXTROSE 2-2.08 GM-% IV SOLR
INTRAVENOUS | Status: AC
  Filled 2014-09-02: qty 50

## 2014-09-02 MED ORDER — DEXTROSE 5 % IV SOLN
2.0000 g | Freq: Two times a day (BID) | INTRAVENOUS | Status: AC
  Administered 2014-09-02: 2 g via INTRAVENOUS
  Filled 2014-09-02: qty 2

## 2014-09-02 MED ORDER — MIDAZOLAM HCL 2 MG/2ML IJ SOLN
INTRAMUSCULAR | Status: AC
  Filled 2014-09-02: qty 2

## 2014-09-02 MED ORDER — FENTANYL CITRATE 0.05 MG/ML IJ SOLN
INTRAMUSCULAR | Status: AC
Start: 2014-09-02 — End: 2014-09-03
  Filled 2014-09-02: qty 2

## 2014-09-02 MED ORDER — FAMOTIDINE 20 MG PO TABS
20.0000 mg | ORAL_TABLET | Freq: Two times a day (BID) | ORAL | Status: DC | PRN
  Filled 2014-09-02: qty 1

## 2014-09-02 MED ORDER — DIPHENHYDRAMINE HCL 12.5 MG/5ML PO ELIX: 12.5000 mg | ORAL_SOLUTION | Freq: Four times a day (QID) | ORAL | Status: DC | PRN

## 2014-09-02 MED ORDER — ONDANSETRON HCL 4 MG/2ML IJ SOLN
INTRAMUSCULAR | Status: AC
  Filled 2014-09-02: qty 2

## 2014-09-02 MED ORDER — ZOLPIDEM TARTRATE 5 MG PO TABS: 5.0000 mg | ORAL_TABLET | Freq: Every evening | ORAL | Status: DC | PRN

## 2014-09-02 MED ORDER — NALOXONE HCL 0.4 MG/ML IJ SOLN: 0.4000 mg | INTRAMUSCULAR | Status: DC | PRN

## 2014-09-02 MED ORDER — DEXTROSE 5 % IV SOLN
2.0000 g | INTRAVENOUS | Status: AC
  Administered 2014-09-02: 2 g via INTRAVENOUS
  Filled 2014-09-02: qty 2

## 2014-09-02 MED ORDER — ALVIMOPAN 12 MG PO CAPS
12.0000 mg | ORAL_CAPSULE | Freq: Once | ORAL | Status: AC
  Administered 2014-09-02: 12 mg via ORAL
  Filled 2014-09-02: qty 1

## 2014-09-02 MED ORDER — FENTANYL CITRATE 0.05 MG/ML IJ SOLN
INTRAMUSCULAR | Status: DC | PRN
  Administered 2014-09-02 (×2): 50 ug via INTRAVENOUS
  Administered 2014-09-02 (×3): 100 ug via INTRAVENOUS
  Administered 2014-09-02: 50 ug via INTRAVENOUS

## 2014-09-02 MED ORDER — ROCURONIUM BROMIDE 100 MG/10ML IV SOLN
INTRAVENOUS | Status: DC | PRN
  Administered 2014-09-02 (×2): 5 mg via INTRAVENOUS
  Administered 2014-09-02 (×2): 20 mg via INTRAVENOUS
  Administered 2014-09-02: 30 mg via INTRAVENOUS
  Administered 2014-09-02: 10 mg via INTRAVENOUS

## 2014-09-02 MED ORDER — BUPIVACAINE-EPINEPHRINE 0.25% -1:200000 IJ SOLN
INTRAMUSCULAR | Status: AC
  Filled 2014-09-02: qty 1

## 2014-09-02 MED ORDER — HYDROMORPHONE HCL 2 MG/ML IJ SOLN
INTRAMUSCULAR | Status: AC
  Filled 2014-09-02: qty 1

## 2014-09-02 MED ORDER — MIDAZOLAM HCL 5 MG/5ML IJ SOLN
INTRAMUSCULAR | Status: DC | PRN
  Administered 2014-09-02: 2 mg via INTRAVENOUS

## 2014-09-02 MED ORDER — LACTATED RINGERS IV SOLN
INTRAVENOUS | Status: DC
  Administered 2014-09-02 (×2): via INTRAVENOUS
  Administered 2014-09-02: 1000 mL via INTRAVENOUS

## 2014-09-02 MED ORDER — 0.9 % SODIUM CHLORIDE (POUR BTL) OPTIME
TOPICAL | Status: DC | PRN
  Administered 2014-09-02: 2000 mL

## 2014-09-02 MED ORDER — HYDROMORPHONE HCL 1 MG/ML IJ SOLN: 0.2500 mg | INTRAMUSCULAR | Status: DC | PRN

## 2014-09-02 MED ORDER — ROCURONIUM BROMIDE 100 MG/10ML IV SOLN
INTRAVENOUS | Status: AC
  Filled 2014-09-02: qty 1

## 2014-09-02 MED ORDER — LACTATED RINGERS IR SOLN
Status: DC | PRN
  Administered 2014-09-02: 3000 mL

## 2014-09-02 MED ORDER — PROMETHAZINE HCL 25 MG/ML IJ SOLN: 6.2500 mg | INTRAMUSCULAR | Status: DC | PRN

## 2014-09-02 MED ORDER — PANTOPRAZOLE SODIUM 40 MG PO TBEC
80.0000 mg | DELAYED_RELEASE_TABLET | Freq: Every day | ORAL | Status: DC
  Administered 2014-09-03 – 2014-09-06 (×4): 80 mg via ORAL
  Filled 2014-09-02 (×5): qty 2

## 2014-09-02 MED ORDER — ONDANSETRON HCL 4 MG/2ML IJ SOLN
4.0000 mg | Freq: Four times a day (QID) | INTRAMUSCULAR | Status: DC | PRN
  Filled 2014-09-02: qty 2

## 2014-09-02 MED ORDER — HEPARIN SODIUM (PORCINE) 5000 UNIT/ML IJ SOLN
5000.0000 [IU] | Freq: Once | INTRAMUSCULAR | Status: AC
  Administered 2014-09-02: 5000 [IU] via SUBCUTANEOUS
  Filled 2014-09-02: qty 1

## 2014-09-02 MED ORDER — DIPHENHYDRAMINE HCL 50 MG/ML IJ SOLN: 12.5000 mg | Freq: Four times a day (QID) | INTRAMUSCULAR | Status: DC | PRN

## 2014-09-02 MED ORDER — MORPHINE SULFATE (PF) 1 MG/ML IV SOLN
INTRAVENOUS | Status: AC
Start: 2014-09-02 — End: 2014-09-03
  Filled 2014-09-02: qty 25

## 2014-09-02 MED ORDER — MORPHINE SULFATE (PF) 1 MG/ML IV SOLN
INTRAVENOUS | Status: DC
  Administered 2014-09-02: 1.5 mg via INTRAVENOUS
  Administered 2014-09-02: 14:00:00 via INTRAVENOUS
  Administered 2014-09-02: 6 mg via INTRAVENOUS
  Administered 2014-09-03: 3 mg via INTRAVENOUS
  Administered 2014-09-03: 5.4 mg via INTRAVENOUS
  Administered 2014-09-03: 4.09 mg via INTRAVENOUS
  Administered 2014-09-03 (×3): 3 mg via INTRAVENOUS
  Administered 2014-09-03: 7.5 mg via INTRAVENOUS
  Administered 2014-09-03: 10:00:00 via INTRAVENOUS
  Administered 2014-09-04: 1.5 mg via INTRAVENOUS
  Administered 2014-09-04: 4.5 mg via INTRAVENOUS
  Administered 2014-09-04: 11:00:00 via INTRAVENOUS
  Administered 2014-09-04: 3 mg via INTRAVENOUS
  Administered 2014-09-05 – 2014-09-06 (×5): 1.5 mg via INTRAVENOUS
  Filled 2014-09-02 (×2): qty 25

## 2014-09-02 MED ORDER — DEXAMETHASONE SODIUM PHOSPHATE 10 MG/ML IJ SOLN
INTRAMUSCULAR | Status: DC | PRN
  Administered 2014-09-02: 10 mg via INTRAVENOUS

## 2014-09-02 MED ORDER — NEOSTIGMINE METHYLSULFATE 10 MG/10ML IV SOLN
INTRAVENOUS | Status: AC
  Filled 2014-09-02: qty 1

## 2014-09-02 MED ORDER — FENTANYL CITRATE 0.05 MG/ML IJ SOLN
INTRAMUSCULAR | Status: AC
  Filled 2014-09-02: qty 2

## 2014-09-02 MED ORDER — ONDANSETRON HCL 4 MG/2ML IJ SOLN
INTRAMUSCULAR | Status: DC | PRN
  Administered 2014-09-02: 4 mg via INTRAVENOUS

## 2014-09-02 MED ORDER — DEXAMETHASONE SODIUM PHOSPHATE 10 MG/ML IJ SOLN
INTRAMUSCULAR | Status: AC
  Filled 2014-09-02: qty 1

## 2014-09-02 MED ORDER — PROPOFOL 10 MG/ML IV BOLUS
INTRAVENOUS | Status: DC | PRN
Start: 2014-09-02 — End: 2014-09-02
  Administered 2014-09-02: 180 mg via INTRAVENOUS

## 2014-09-02 MED ORDER — GLYCOPYRROLATE 0.2 MG/ML IJ SOLN
INTRAMUSCULAR | Status: AC
  Filled 2014-09-02: qty 3

## 2014-09-02 MED ORDER — PROPOFOL 10 MG/ML IV BOLUS
INTRAVENOUS | Status: AC
  Filled 2014-09-02: qty 20

## 2014-09-02 MED ORDER — BUPIVACAINE-EPINEPHRINE (PF) 0.25% -1:200000 IJ SOLN
INTRAMUSCULAR | Status: DC | PRN
  Administered 2014-09-02: 6 mL

## 2014-09-02 MED ORDER — FENTANYL CITRATE 0.05 MG/ML IJ SOLN
25.0000 ug | INTRAMUSCULAR | Status: DC | PRN
  Administered 2014-09-02: 25 ug via INTRAVENOUS
  Administered 2014-09-02 (×2): 50 ug via INTRAVENOUS
  Administered 2014-09-02: 25 ug via INTRAVENOUS

## 2014-09-02 MED ORDER — ALVIMOPAN 12 MG PO CAPS
12.0000 mg | ORAL_CAPSULE | Freq: Two times a day (BID) | ORAL | Status: DC
  Administered 2014-09-03 – 2014-09-05 (×6): 12 mg via ORAL
  Filled 2014-09-02 (×8): qty 1

## 2014-09-02 MED ORDER — ACETAMINOPHEN 10 MG/ML IV SOLN
1000.0000 mg | Freq: Once | INTRAVENOUS | Status: AC
  Administered 2014-09-02: 1000 mg via INTRAVENOUS
  Filled 2014-09-02 (×2): qty 100

## 2014-09-02 MED ORDER — ACETAMINOPHEN 500 MG PO TABS
1000.0000 mg | ORAL_TABLET | Freq: Four times a day (QID) | ORAL | Status: AC
Start: 2014-09-02 — End: 2014-09-03
  Administered 2014-09-03 (×2): 1000 mg via ORAL
  Filled 2014-09-02 (×2): qty 2

## 2014-09-02 MED ORDER — NEOSTIGMINE METHYLSULFATE 10 MG/10ML IV SOLN
INTRAVENOUS | Status: DC | PRN
  Administered 2014-09-02: 4 mg via INTRAVENOUS

## 2014-09-02 MED ORDER — FENTANYL CITRATE 0.05 MG/ML IJ SOLN
INTRAMUSCULAR | Status: AC
  Filled 2014-09-02: qty 5

## 2014-09-02 SURGICAL SUPPLY — 76 items
APPLIER CLIP 5 13 M/L LIGAMAX5 (MISCELLANEOUS) ×2
APPLIER CLIP ROT 10 11.4 M/L (STAPLE)
APR CLP MED LRG 11.4X10 (STAPLE)
APR CLP MED LRG 5 ANG JAW (MISCELLANEOUS) ×1
BLADE EXTENDED COATED 6.5IN (ELECTRODE) ×2 IMPLANT
BLADE HEX COATED 2.75 (ELECTRODE) ×4 IMPLANT
BLADE SURG SZ10 CARB STEEL (BLADE) ×2 IMPLANT
CABLE HIGH FREQUENCY MONO STRZ (ELECTRODE) ×2 IMPLANT
CELLS DAT CNTRL 66122 CELL SVR (MISCELLANEOUS) IMPLANT
CHLORAPREP W/TINT 26ML (MISCELLANEOUS) ×2 IMPLANT
CLIP APPLIE 5 13 M/L LIGAMAX5 (MISCELLANEOUS) ×1 IMPLANT
CLIP APPLIE ROT 10 11.4 M/L (STAPLE) IMPLANT
COVER MAYO STAND STRL (DRAPES) ×4 IMPLANT
DECANTER SPIKE VIAL GLASS SM (MISCELLANEOUS) ×2 IMPLANT
DRAIN CHANNEL 19F RND (DRAIN) IMPLANT
DRAPE LAPAROSCOPIC ABDOMINAL (DRAPES) ×2 IMPLANT
DRAPE SHEET LG 3/4 BI-LAMINATE (DRAPES) ×2 IMPLANT
DRAPE UTILITY 15X26 (DRAPE) ×4 IMPLANT
DRAPE WARM FLUID 44X44 (DRAPE) ×2 IMPLANT
DRSG TELFA 3X8 NADH (GAUZE/BANDAGES/DRESSINGS) ×2 IMPLANT
ELECT PENCIL ROCKER SW 15FT (MISCELLANEOUS) ×1 IMPLANT
ELECT REM PT RETURN 9FT ADLT (ELECTROSURGICAL) ×2
ELECTRODE REM PT RTRN 9FT ADLT (ELECTROSURGICAL) ×1 IMPLANT
EVACUATOR SILICONE 100CC (DRAIN) ×1 IMPLANT
GAUZE SPONGE 4X4 12PLY STRL (GAUZE/BANDAGES/DRESSINGS) ×2 IMPLANT
GLOVE BIO SURGEON STRL SZ 6.5 (GLOVE) ×5 IMPLANT
GLOVE BIOGEL PI IND STRL 7.0 (GLOVE) ×5 IMPLANT
GLOVE BIOGEL PI INDICATOR 7.0 (GLOVE) ×5
GOWN SPEC L4 XLG W/TWL (GOWN DISPOSABLE) ×4 IMPLANT
KIT BASIN OR (CUSTOM PROCEDURE TRAY) ×3 IMPLANT
LEGGING LITHOTOMY PAIR STRL (DRAPES) ×2 IMPLANT
LIGASURE IMPACT 36 18CM CVD LR (INSTRUMENTS) IMPLANT
LIQUID BAND (GAUZE/BANDAGES/DRESSINGS) ×1 IMPLANT
NDL INSUFFLATION 14GA 120MM (NEEDLE) IMPLANT
NEEDLE INSUFFLATION 14GA 120MM (NEEDLE) ×2 IMPLANT
NS IRRIG 1000ML POUR BTL (IV SOLUTION) ×4 IMPLANT
PAD DRESSING TELFA 3X8 NADH (GAUZE/BANDAGES/DRESSINGS) IMPLANT
PENCIL BUTTON HOLSTER BLD 10FT (ELECTRODE) ×4 IMPLANT
RTRCTR WOUND ALEXIS 18CM MED (MISCELLANEOUS)
SCISSORS LAP 5X35 DISP (ENDOMECHANICALS) ×2 IMPLANT
SEALER TISSUE G2 CVD JAW 35 (ENDOMECHANICALS) ×1 IMPLANT
SEALER TISSUE G2 CVD JAW 45CM (ENDOMECHANICALS) ×1
SEALER TISSUE G2 STRG ARTC 35C (ENDOMECHANICALS) ×2 IMPLANT
SET IRRIG TUBING LAPAROSCOPIC (IRRIGATION / IRRIGATOR) ×2 IMPLANT
SLEEVE XCEL OPT CAN 5 100 (ENDOMECHANICALS) ×2 IMPLANT
SOLUTION ANTI FOG 6CC (MISCELLANEOUS) ×2 IMPLANT
SPONGE LAP 18X18 X RAY DECT (DISPOSABLE) ×4 IMPLANT
STAPLER CIRC ILS CVD 33MM 37CM (STAPLE) ×1 IMPLANT
STAPLER VISISTAT 35W (STAPLE) ×2 IMPLANT
SUCTION POOLE TIP (SUCTIONS) ×2 IMPLANT
SUT ETHILON 2 0 PS N (SUTURE) IMPLANT
SUT NOVA NAB DX-16 0-1 5-0 T12 (SUTURE) ×4 IMPLANT
SUT PDS AB 1 CTX 36 (SUTURE) IMPLANT
SUT PDS AB 1 TP1 96 (SUTURE) IMPLANT
SUT PROLENE 2 0 KS (SUTURE) ×1 IMPLANT
SUT SILK 2 0 (SUTURE) ×2
SUT SILK 2 0 SH CR/8 (SUTURE) ×2 IMPLANT
SUT SILK 2-0 18XBRD TIE 12 (SUTURE) ×1 IMPLANT
SUT SILK 3 0 (SUTURE) ×4
SUT SILK 3 0 SH CR/8 (SUTURE) ×3 IMPLANT
SUT SILK 3-0 18XBRD TIE 12 (SUTURE) ×1 IMPLANT
SUT VIC AB 2-0 SH 18 (SUTURE) IMPLANT
SUT VICRYL 2 0 18  UND BR (SUTURE) ×1
SUT VICRYL 2 0 18 UND BR (SUTURE) ×1 IMPLANT
SYS LAPSCP GELPORT 120MM (MISCELLANEOUS)
SYSTEM LAPSCP GELPORT 120MM (MISCELLANEOUS) IMPLANT
TAPE CLOTH SURG 4X10 WHT LF (GAUZE/BANDAGES/DRESSINGS) ×2 IMPLANT
TOWEL OR 17X26 10 PK STRL BLUE (TOWEL DISPOSABLE) ×4 IMPLANT
TOWEL OR NON WOVEN STRL DISP B (DISPOSABLE) ×4 IMPLANT
TRAY FOLEY CATH 14FRSI W/METER (CATHETERS) ×2 IMPLANT
TRAY LAPAROSCOPIC (CUSTOM PROCEDURE TRAY) ×2 IMPLANT
TROCAR BLADELESS OPT 5 100 (ENDOMECHANICALS) ×2 IMPLANT
TROCAR XCEL BLUNT TIP 100MML (ENDOMECHANICALS) ×2 IMPLANT
TUBING FILTER THERMOFLATOR (ELECTROSURGICAL) ×2 IMPLANT
YANKAUER SUCT BULB TIP 10FT TU (MISCELLANEOUS) ×4 IMPLANT
YANKAUER SUCT BULB TIP NO VENT (SUCTIONS) ×1 IMPLANT

## 2014-09-02 NOTE — Op Note (Signed)
09/02/2014  1:37 PM  PATIENT:  [name]  64 y.o. female  Patient Care Team: Barton Fanny, MD as PCP - General (Family Medicine) Lyman Speller, MD as Consulting Physician (Gynecology) Roseanne Kaufman, MD as Consulting Physician (Orthopedic Surgery) Mayme Genta, MD as Consulting Physician (Gastroenterology)  PRE-OPERATIVE DIAGNOSIS:  History of colonic perforation, colostomy present  POST-OPERATIVE DIAGNOSIS:  History of colonic perforation, colostomy present  PROCEDURE:  LAPAROSCOPIC COLOSTOMY REVERSAL   Surgeon(s): [name], MD Rolm Bookbinder, MD  ASSISTANT: Donne Hazel    ANESTHESIA:   local and general  EBL: 335ml Total I/O In: 2300 [I.V.:2300] Out: 700 [Urine:400; Blood:300]  Delay start of Pharmacological VTE agent (>24hrs) due to surgical blood loss or risk of bleeding:  no  DRAINS: none   SPECIMEN:  Source of Specimen:  Colostomy   DISPOSITION OF SPECIMEN:  PATHOLOGY  COUNTS:  YES  PLAN OF CARE: Admit to inpatient   PATIENT DISPOSITION:  PACU - hemodynamically stable.  INDICATION:    This is a 64 y.o. F who is s/p colostomy placed for a colonic perforation during colonoscopy.  She is now ready to get this taken down.  The anatomy & physiology of the digestive tract was discussed.  The pathophysiology was discussed.  Natural history risks without surgery was discussed.   I worked to give an overview of the disease and the frequent need to have multispecialty involvement.  I feel the risks of no intervention will lead to serious problems that outweigh the operative risks; therefore, I recommended a partial colectomy to remove the pathology.  Laparoscopic & open techniques were discussed.   Risks such as bleeding, infection, abscess, leak, reoperation, possible ostomy, hernia, heart attack, death, and other risks were discussed.  I noted a good likelihood this will help address the problem.   Goals of post-operative recovery were  discussed as well.    The patient expressed understanding & wished to proceed with surgery.  OR FINDINGS:   No obvious metastatic disease on visceral parietal peritoneum or liver.  The anastomosis rests 15 cm from the anal verge by rigid proctoscopy.  DESCRIPTION:   Informed consent was confirmed.  The patient underwent general anaesthesia without difficulty.  The patient was positioned appropriately.  VTE prevention in place.  The patient's abdomen was clipped, prepped, & draped in a sterile fashion.  Surgical timeout confirmed our plan.  The patient was positioned in reverse Trendelenburg.  Abdominal entry was gained using a Varies needle in the RUQ.  Entry was clean.  I induced carbon dioxide insufflation.  Camera inspection revealed no injury.  Extra ports were carefully placed on the right side under direct laparoscopic visualization.  The omentum and part of the transverse colon were adherent to the midline wound. This was taken down using combination of blunt and sharp dissection. After this was completely clear I removed all adhesions from around the ostomy site using blunt and sharp dissection. Once this was completed by terminated attention to the patient's rectal stump. The patient was placed in steep reverse Trendelenburg and the small bowel was evacuated out of the abdomen. I found the rectal stump without difficulty. I divided the peritoneal covering sharply and dissected out the stump from the sacrum. I mobilized this off of the pelvic sidewall using sharp dissection. The hemorrhoidal artery was noted to be intact. I mobilized a couple of loops of small bowel from the left lower quadrant. This was done using sharp dissection. After this was completed I mobilized  the rectum off of the right pelvic sidewall also using sharp dissection. Once this was free, I removed the laparoscopic equipment and terminated attention to the patient's ostomy. The ostomy was taken down from its position in  the left upper quadrant. The omentum was freed from the ostomy and dropped back into the abdomen. After the ostomy was completely free I opened the end of this and placed rectal dilators. The ostomy was patent. There were no strictures. I then placed a purse stringer device around the ostomy in place the 2 pursestring suture. I removed the remaining colostomy using Bovie electrocautery. There was good bleeding noted at the marginal artery which was secured with a 2-0 silk suture. I then secured the Prolene suture using 3-0 silk sutures interrupted around the opening. I then closed the pursestring over a 33 mm EEA anvil. This was then dropped back into the abdomen. The fascia was then closed using interrupted #1 Novafil sutures. I then mobilized the colon internally. I took down the remaining omental attachments and divided the inferior mesenteric vein to help mobilize the colon into the pelvis. After this was complete of the colon easily mobilized into the pelvis without tension. We brought an EEA stapler up through the rectal stump. The spike was brought out through the previous staple line and an anastomosis was created without tension. This was checked using insufflation under water and no leak was identified. Hemostasis was good. I irrigated the entire abdomen. I then placed the omentum back over the small bowel and desufflated the abdomen. The 5 mm port sites were closed using 4-0 Vicryl sutures and Dermabond. The ostomy site was partially closed using a 2-0 Vicryl pursestring suture.  A Teflon wick was placed down through the middle of this and a dressing was applied.  The patient was then awakened from anesthesia and sent to the post anesthesia care unit in stable condition. All counts are correct per operating room staff.

## 2014-09-02 NOTE — Anesthesia Preprocedure Evaluation (Signed)
Anesthesia Evaluation  Patient identified by MRN, date of birth, ID band Patient awake    Reviewed: Allergy & Precautions, NPO status , Patient's Chart, lab work & pertinent test results  History of Anesthesia Complications (+) history of anesthetic complications  Airway Mallampati: II  TM Distance: >3 FB Neck ROM: Full    Dental no notable dental hx.    Pulmonary neg pulmonary ROS, former smoker,  breath sounds clear to auscultation  Pulmonary exam normal       Cardiovascular negative cardio ROS  Rhythm:Regular Rate:Normal     Neuro/Psych PSYCHIATRIC DISORDERS Anxiety Depression negative neurological ROS     GI/Hepatic Neg liver ROS, GERD-  Medicated,  Endo/Other  negative endocrine ROS  Renal/GU negative Renal ROS  negative genitourinary   Musculoskeletal negative musculoskeletal ROS (+)   Abdominal   Peds negative pediatric ROS (+)  Hematology negative hematology ROS (+)   Anesthesia Other Findings   Reproductive/Obstetrics negative OB ROS                             Anesthesia Physical Anesthesia Plan  ASA: II  Anesthesia Plan: General   Post-op Pain Management:    Induction: Intravenous  Airway Management Planned: Oral ETT  Additional Equipment:   Intra-op Plan:   Post-operative Plan: Extubation in OR  Informed Consent: I have reviewed the patients History and Physical, chart, labs and discussed the procedure including the risks, benefits and alternatives for the proposed anesthesia with the patient or authorized representative who has indicated his/her understanding and acceptance.   Dental advisory given  Plan Discussed with: CRNA  Anesthesia Plan Comments:         Anesthesia Quick Evaluation

## 2014-09-02 NOTE — Anesthesia Procedure Notes (Signed)
Procedure Name: Intubation Date/Time: 09/02/2014 10:49 AM Performed by: Dione Booze Pre-anesthesia Checklist: Patient identified, Suction available, Emergency Drugs available and Patient being monitored Patient Re-evaluated:Patient Re-evaluated prior to inductionOxygen Delivery Method: Circle system utilized Preoxygenation: Pre-oxygenation with 100% oxygen Intubation Type: IV induction Laryngoscope Size: Mac and 4 Grade View: Grade II Tube type: Oral Tube size: 7.5 mm Number of attempts: 1 Secured at: 21 cm Tube secured with: Tape

## 2014-09-02 NOTE — Interval H&P Note (Signed)
History and Physical Interval Note:  09/02/2014 10:12 AM  [name]  has presented today for surgery, with the diagnosis of colonic perforation  The various methods of treatment have been discussed with the patient and family. After consideration of risks, benefits and other options for treatment, the patient has consented to  Procedure(s): LAPAROSCOPIC COLOSTOMY REVERSAL (N/A) as a surgical intervention .  The patient's history has been reviewed, patient examined, no change in status, stable for surgery.  I have reviewed the patient's chart and labs.  Questions were answered to the patient's satisfaction.     Rosario Adie, MD  [facility]

## 2014-09-02 NOTE — H&P (View-Only) (Signed)
[name] 08/05/2014 11:05 AM Location: [facility] Patient #: [id number] DOB: [birth year] Single / Language: [name] / Race: White Female History of Present Illness Jo Ruff MD; 60/63/0160 12:07 PM) Patient words: reck.  The patient is a 64 year old female who presents with a complaint of colostomy reversal. Patient is here for discussion of closure of her colostomy. She underwent an emergent sigmoid colectomy and colostomy with Hartman's procedure for a distal sigmoid tear that was noted after colonoscopy. She reports that she is not having any major difficulties with the colostomy but does have some pouching issues. She denies any abdominal pain. She is having regular bowel movements. Her biggest concern is her abdominal scar. Over the last year she hired a Clinical research associate to help with her appearance. She is mostly depressed that now she has multiple scars on her abdomen disfiguring it. I have reviewed my partner's note regarding her operation. It appears that she had a tear that transversed the rectosigmoid junction. It sounds like the colon was transected at this point. The splenic flexure was mobilized during the operation as well. Other Problems [name] [place], [place]; 08/05/2014 11:26 AM) POST-OPERATIVE STATE [phone]) Gastroesophageal Reflux Disease Hypercholesterolemia Oophorectomy Bilateral. Anxiety Disorder  Past Surgical History Jo Buffy, MA; 08/05/2014 11:26 AM) Mammoplasty; Reduction Bilateral. Oral Surgery Tonsillectomy Hysterectomy (not due to cancer) - Complete  Diagnostic Studies History [name] [place], [place]; 08/05/2014 11:26 AM) Colonoscopy within last year Mammogram 1-3 years ago Pap Smear 1-5 years ago  Allergies [name], CMA; 08/05/2014 11:06 AM) Statins Depletion *DIETARY PRODUCTS/DIETARY MANAGEMENT PRODUCTS* [name]  Medication History [name], CMA;  08/05/2014 11:06 AM) [name] (2MG  Tablet, Oral) Active. Lasix (20MG  Tablet, Oral) Active. Levaquin (500MG  Tablet, Oral) Active. K-Dur (10MEQ Tablet ER, Oral) Active. Omeprazole (20MG  Tablet DR, Oral) Active.  Social History [name] [place], [place]; 08/05/2014 11:26 AM) Tobacco use Former smoker. Alcohol use Occasional alcohol use. Caffeine use Carbonated beverages, Coffee, Tea. No drug use  Family History [name] [name], [place]; 08/05/2014 11:26 AM) Cancer Father, Sister. Depression Brother, Mother, Sister. Heart Disease Father. Heart disease in female family member before age 97  Pregnancy / Birth History [name] [place], [place]; 08/05/2014 11:26 AM) Age at menarche 69 years. Age of menopause <46 Gravida 7 Irregular periods Maternal age 79-30 Para 2    Vitals ([name]; 08/05/2014 11:06 AM) 08/05/2014 11:05 AM Weight: 156 lb Height: 66in Body Surface Area: 1.82 m Body Mass Index: 25.18 kg/m Temp.: 39F(Temporal)  Pulse: 79 (Regular)  BP: 122/80 (Sitting, Left Arm, Standard)     Physical Exam Jo Ruff MD; 22/09/5425 12:14 PM)  General Mental Status-Alert. General Appearance-Consistent with stated age. Hydration-Well hydrated. Voice-Normal.  Head and Neck Head-normocephalic, atraumatic with no lesions or palpable masses. Trachea-midline. Thyroid Gland Characteristics - normal size and consistency.  Eye Eyeball - Bilateral-Extraocular movements intact. Sclera/Conjunctiva - Bilateral-No scleral icterus.  Chest and Lung Exam Chest and lung exam reveals -quiet, even and easy respiratory effort with no use of accessory muscles and on auscultation, normal breath sounds, no adventitious sounds and normal vocal resonance. Inspection Chest Wall - Normal. Back - normal.  Cardiovascular Cardiovascular examination reveals -normal heart sounds, regular rate and rhythm with no murmurs and normal pedal pulses  bilaterally.  Abdomen Inspection Inspection of the abdomen reveals - No Hernias. Skin - Scar - Note: Midline abdominal scar with a raised portion in the right periumbilical region. Colostomy and placed periumbilically on the left side. Palpation/Percussion Palpation and Percussion  of the abdomen reveal - Soft, Non Tender, No Rebound tenderness, No Rigidity (guarding) and No hepatosplenomegaly. Auscultation Auscultation of the abdomen reveals - Bowel sounds normal.  Neurologic Neurologic evaluation reveals -alert and oriented x 3 with no impairment of recent or remote memory. Mental Status-Normal.  Musculoskeletal Global Assessment -Note:no gross deformities.  Normal Exam - Left-Upper Extremity Strength Normal and Lower Extremity Strength Normal. Normal Exam - Right-Upper Extremity Strength Normal and Lower Extremity Strength Normal.    Assessment & Plan Jo Ruff MD; 35/00/9381 12:20 PM)  COLOSTOMY IN PLACE (V44.3  Z93.3) Story: Patient is approximately 3 months status post emergent colectomy and Hartman's procedure for colonic perforation after colonoscopy. She is scheduled to undergo a completion colonoscopy with Dr. Ardis [name] later this month. After this it appears she will be ready for reversal. Impression: We discussed ways to perform a colostomy reversal and meet her goals of treatment. She is mostly concerned of her surgical scar and a portion of the scar which is raised towards the right of her umbilicus. She would like to avoid as many new scars as possible. She would also like to avoid reopening the midline incision if possible. I think this is reasonable to do laparoscopically and use 5 mm trochars for minimal scarring outside of her midline wound. We discussed that we would leave the colostomy partially open to allow for drainage. We discussed that if it became unsafe to continue laparoscopically we would reopen the scar at that time. She is in agreement to this.  She has decided to wait and see how the scar evens out over the next year and possibly consult a plastic surgeon for revision at that time.  The surgery and anatomy were described to the patient as well as the risks of surgery and the possible complications. These include: Bleeding, deep abdominal infections and possible wound complications such as hernia and infection, damage to adjacent structures, leak of surgical connections, which can lead to other surgeries and possibly an ostomy, possible need for other procedures, such as abscess drains in radiology, possible prolonged hospital stay, possible diarrhea from removal of part of the colon, possible constipation from narcotics, possible bowel, bladder or sexual dysfunction if having rectal surgery, prolonged fatigue/weakness or appetite loss, possible early recurrence of of disease, possible complications of their medical problems such as heart disease or arrhythmias or lung problems, death (less than 1%). I believe the patient understands and wishes to proceed with the surgery.  Current Plans Pt Education - [facility] Bowel Prep Started Neomycin Sulfate 500MG , 2 (two) Tablet SEE NOTE, #6, 08/05/2014, No Refill. Local Order: TAKE TWO TABLETS AT 2 PM, 3 PM, AND 10 PM THE DAY PRIOR TO SURGERY Started Flagyl 500MG , 2 (two) Tablet SEE NOTE, #6, 08/05/2014, No Refill. Local Order: Take at 2pm, 3pm, and 10pm the day prior to your colon operation

## 2014-09-02 NOTE — H&P (View-Only) (Signed)
HPI: This is a very pleasant 64 year old woman whom I am meeting for the first time today.    Behavioral health RN.  Colonoscopy 2006 [place], normal no polyps. Per patient. Colonoscopy 2015 Dr. Earlean Shawl 04/2014.  C/B colon perforation.  Her sister has esophageal cancer, metastatic.   She has chronic GERD, pyrosis.  She believes there may have been Barrett's.  She has chronic GERD.  Currently on prilosec one pill once daily with good control of her symptoms.  No dysphagia.   Following the perforation she had segmental resection, colostomy, required wound VAC while in hospital  She is very interested in having her colostomy taken down. She is getting a second opinion about the nature of that surgery.  Review of systems: Pertinent positive and negative review of systems were noted in the above HPI section. Complete review of systems was performed and was otherwise normal.    Past Medical History  Diagnosis Date  . Depression   . GERD (gastroesophageal reflux disease)   . Hyperlipidemia   . Anxiety   . Vitamin D deficiency   . Suicide attempt   . Insomnia   . Perforated sigmoid colon 04/28/14    during colonoscopy, became septic, has colostomy and open surgical wound    Past Surgical History  Procedure Laterality Date  . Abdominal hysterectomy  1999    TAH/BSO  . Elbow surgery  2007  . Ulnar tunnel release  2010  . Blepharoplasty  2010  . Cosmetic surgery    . Eye surgery    . Debridement tennis elbow    . Breast surgery  2001    Breast reduction  . Dilation and curettage of uterus      x3, hysteroscopy  . Laparotomy N/A 04/28/2014    Procedure: EXPLORATORY LAPAROTOMY,SIGMOID COLECTOMY;  Surgeon: Rolm Bookbinder, MD;  Location: [facility];  Service: General;  Laterality: N/A;  . Colostomy N/A 04/28/2014    Procedure: COLOSTOMY;  Surgeon: Rolm Bookbinder, MD;  Location: [place];  Service: General;  Laterality: N/A;  . Application of wound vac  04/28/2014    Procedure: APPLICATION  OF WOUND VAC;  Surgeon: Rolm Bookbinder, MD;  Location: [place];  Service: General;;  . Colostomy revision N/A 04/28/2014    Procedure: COLON RESECTION SIGMOID;  Surgeon: Rolm Bookbinder, MD;  Location: [place];  Service: General;  Laterality: N/A;    Current Outpatient Prescriptions  Medication Sig Dispense Refill  . eszopiclone 3 MG TABS Take 1 tablet (3 mg total) by mouth at bedtime as needed for sleep. Take immediately before bedtime 30 tablet 2  . omeprazole (PRILOSEC) 10 MG capsule Take 10 mg by mouth daily.    . ranitidine (ZANTAC) 150 MG tablet Take 150 mg by mouth as needed for heartburn.     No current facility-administered medications for this visit.    Allergies as of 08/04/2014 - Review Complete 08/04/2014  Allergen Reaction Noted  . Bee venom  05/06/2013  . Statins Other (See Comments) 10/18/2012    Family History  Problem Relation Age of Onset  . Hyperlipidemia Father   . Hypertension Father   . Diabetes Father   . Cancer Father     History of bladder cancer  . Hypertension Mother   . Diabetes Mother   . Heart disease Maternal Grandmother   . Stroke Maternal Grandfather   . Hyperlipidemia Brother   . Cancer Paternal Grandfather     pancreatic cancer  . Cancer Paternal Grandmother     uterine  .  Heart disease Father   . Esophageal cancer Sister     History   Social History  . Marital Status: Single    Spouse Name: N/A    Number of Children: N/A  . Years of Education: N/A   Occupational History  . Not on file.   Social History Main Topics  . Smoking status: Former Research scientist (life sciences)  . Smokeless tobacco: Never Used  . Alcohol Use: 1.0 oz/week    2 drink(s) per week  . Drug Use: No  . Sexual Activity: No     Comment: TAH/BSO   Other Topics Concern  . Not on file   Social History Narrative   College Degree   Registered Nurse   Exercises             Physical Exam: BP 110/70 mmHg  Pulse 64  Ht 5\' 6"  (1.676 m)  Wt 159 lb (72.122 kg)  BMI 25.68  kg/m2  LMP 08/07/1998 Constitutional: generally well-appearing Psychiatric: alert and oriented x3 Eyes: extraocular movements intact Mouth: oral pharynx moist, no lesions Neck: supple no lymphadenopathy Cardiovascular: heart regular rate and rhythm Lungs: clear to auscultation bilaterally Abdomen: soft, nontender, nondistended, no obvious ascites, no peritoneal signs, normal bowel sounds Extremities: no lower extremity edema bilaterally Skin: no lesions on visible extremities    Assessment and plan: 64 y.o. female with  routine risk for colon cancer, chronic GERD, family history of esophageal cancer  She suffered colon perforation during colonoscopy about 3 months ago. Prior to takedown she needs full colon examination which I'm happy to do for her. She does understand that there is still risk of complication during colonoscopy such as perforation, bleeding, missed lesions. We will schedule her for colonoscopy via left-sided colostomy and also via anus at her soonest convenience. She has chronic GERD, perhaps a bit worse lately. Her sister was diagnosed with metastatic esophageal cancer about a year ago and I would like to proceed with EGD at the same time as her colonoscopy.  We will get records from her previous colonoscopy from Dr. Liliane Channel office.

## 2014-09-02 NOTE — Anesthesia Postprocedure Evaluation (Signed)
  Anesthesia Post-op Note  Patient: [name]  Procedure(s) Performed: Procedure(s) (LRB): LAPAROSCOPIC COLOSTOMY REVERSAL (N/A)  Patient Location: PACU  Anesthesia Type: General  Level of Consciousness: awake and alert   Airway and Oxygen Therapy: Patient Spontanous Breathing  Post-op Pain: mild  Post-op Assessment: Post-op Vital signs reviewed, Patient's Cardiovascular Status Stable, Respiratory Function Stable, Patent Airway and No signs of Nausea or vomiting  Last Vitals:  Filed Vitals:   09/02/14 1606  BP:   Pulse:   Temp:   Resp: 14    Post-op Vital Signs: stable   Complications: No apparent anesthesia complications

## 2014-09-02 NOTE — Transfer of Care (Signed)
Immediate Anesthesia Transfer of Care Note  Patient: [name]  Procedure(s) Performed: Procedure(s): LAPAROSCOPIC COLOSTOMY REVERSAL (N/A)  Patient Location: PACU  Anesthesia Type:General  Level of Consciousness: awake, alert , oriented and patient cooperative  Airway & Oxygen Therapy: Patient Spontanous Breathing and Patient connected to face mask oxygen  Post-op Assessment: Report given to PACU RN and Post -op Vital signs reviewed and stable  Post vital signs: Reviewed and stable  Complications: No apparent anesthesia complications

## 2014-09-02 NOTE — Interval H&P Note (Signed)
History and Physical Interval Note:  09/02/2014 10:09 AM  [name]  has presented today for surgery, with the diagnosis of colonic perforation  The various methods of treatment have been discussed with the patient and family. After consideration of risks, benefits and other options for treatment, the patient has consented to  Procedure(s): LAPAROSCOPIC COLOSTOMY REVERSAL (N/A) as a surgical intervention .  The patient's history has been reviewed, patient examined, no change in status, stable for surgery.  I have reviewed the patient's chart and labs.  Questions were answered to the patient's satisfaction.    The surgery and anatomy were described to the patient as well as the risks of surgery and the possible complications.  These include: Bleeding, deep abdominal infections and possible wound complications such as hernia and infection, damage to adjacent structures, leak of surgical connections, which can lead to other surgeries and possibly an ostomy, possible need for other procedures, such as abscess drains in radiology, possible prolonged hospital stay, possible diarrhea from removal of part of the colon, possible constipation from narcotics, prolonged fatigue/weakness or appetite loss, possible early recurrence of of disease, possible complications of their medical problems such as heart disease or arrhythmias or lung problems, death (less than 1%). I believe the patient understands and wishes to proceed with the surgery.   Rosario Adie, MD  [facility]

## 2014-09-03 ENCOUNTER — Encounter (HOSPITAL_COMMUNITY): Payer: Self-pay | Admitting: General Surgery

## 2014-09-03 LAB — BASIC METABOLIC PANEL
Anion gap: 8 (ref 5–15)
BUN: 6 mg/dL (ref 6–23)
CO2: 28 mmol/L (ref 19–32)
Calcium: 8.9 mg/dL (ref 8.4–10.5)
Chloride: 101 mmol/L (ref 96–112)
Creatinine, Ser: 0.71 mg/dL (ref 0.50–1.10)
GFR calc Af Amer: >90 mL/min (ref >90)
GFR calc non Af Amer: >90 mL/min (ref >90)
Glucose, Bld: 110 mg/dL — ABNORMAL HIGH (ref 70–99)
Potassium: 4 mmol/L (ref 3.5–5.1)
Sodium: 137 mmol/L (ref 135–145)

## 2014-09-03 LAB — CBC
HCT: 31.3 % — ABNORMAL LOW (ref 36.0–46.0)
Hemoglobin: 10.3 g/dL — ABNORMAL LOW (ref 12.0–15.0)
MCH: 29.9 pg (ref 26.0–34.0)
MCHC: 32.9 g/dL (ref 30.0–36.0)
MCV: 90.7 fL (ref 78.0–100.0)
Platelets: 306 10*3/uL (ref 150–400)
RBC: 3.45 MIL/uL — ABNORMAL LOW (ref 3.87–5.11)
RDW: 14 % (ref 11.5–15.5)
WBC: 10 10*3/uL (ref 4.0–10.5)

## 2014-09-03 MED ORDER — KCL IN DEXTROSE-NACL 30-5-0.45 MEQ/L-%-% IV SOLN
INTRAVENOUS | Status: DC
  Administered 2014-09-03 – 2014-09-05 (×4): via INTRAVENOUS
  Filled 2014-09-03 (×7): qty 1000

## 2014-09-03 MED ORDER — PANTOPRAZOLE SODIUM 40 MG IV SOLR
40.0000 mg | Freq: Every day | INTRAVENOUS | Status: DC
  Administered 2014-09-03: 40 mg via INTRAVENOUS
  Filled 2014-09-03 (×2): qty 40

## 2014-09-03 MED ORDER — ESZOPICLONE 1 MG PO TABS
3.0000 mg | ORAL_TABLET | Freq: Every evening | ORAL | Status: DC | PRN
  Administered 2014-09-04 – 2014-09-06 (×3): 3 mg via ORAL
  Filled 2014-09-03 (×4): qty 3

## 2014-09-03 NOTE — Progress Notes (Signed)
1 Day Post-Op lap colostomy reversal Subjective: Doing well.  Pain controlled.  No nausea.  Objective: Vital signs in last 24 hours: Temp:  [97.8 F (36.6 C)-98.6 F (37 C)] 97.9 F (36.6 C) (01/28 0532) Pulse Rate:  [66-95] 68 (01/28 0532) Resp:  [10-22] 12 (01/28 0639) BP: (92-133)/(38-75) 99/51 mmHg (01/28 0532) SpO2:  [95 %-100 %] 98 % (01/28 0639) Weight:  [163 lb 2 oz (73.993 kg)] 163 lb 2 oz (73.993 kg) (01/27 0926)   Intake/Output from previous day: 01/27 0701 - 01/28 0700 In: 3890 [I.V.:3790; IV Piggyback:100] Out: 1425 [Urine:1125; Blood:300] Intake/Output this shift:     General appearance: alert and cooperative GI: soft, mildly distended  Incision: no significant drainage  Lab Results:   Recent Labs  08/31/14 1525 09/03/14 0550  WBC 8.0 10.0  HGB 12.9 10.3*  HCT 38.9 31.3*  PLT 343 306   BMET  Recent Labs  08/31/14 1525 09/03/14 0550  NA 137 137  K 4.3 4.0  CL 101 101  CO2 28 28  GLUCOSE 87 110*  BUN 13 6  CREATININE 0.80 0.71  CALCIUM 9.4 8.9   PT/INR No results for input(s): LABPROT, INR in the last 72 hours. ABG No results for input(s): PHART, HCO3 in the last 72 hours.  Invalid input(s): PCO2, PO2  MEDS, Scheduled . acetaminophen  1,000 mg Oral Q6H  . alvimopan  12 mg Oral BID  . enoxaparin (LOVENOX) injection  40 mg Subcutaneous Q24H  . morphine   Intravenous 6 times per day  . pantoprazole  80 mg Oral Daily    Studies/Results: No results found.  Assessment: s/p Procedure(s): LAPAROSCOPIC COLOSTOMY REVERSAL Patient Active Problem List   Diagnosis Date Noted  . Colostomy in place 09/02/2014  . Abdominal pain 04/28/2014  . S/P colectomy 04/28/2014  . HYPERLIPIDEMIA 07/21/2010  . DEPRESSION 07/21/2010  . GERD 07/21/2010  . PNEUMONIA, HX OF 07/21/2010    Expected post op course  Plan: MIV  Ambulate Cont npo until return of bowel function   LOS: 1 day     .Rosario Adie, [facility],  [place] [phone]   09/03/2014 7:51 AM

## 2014-09-03 NOTE — Evaluation (Signed)
Physical Therapy Evaluation Patient Details Name: [name] MRN: [id number] DOB: 26-Sep-1950 Today's Date: 09/03/2014   History of Present Illness  64 yo female s/p lap colostomy reversal 09/02/14.   Clinical Impression  On eval, pt was supervision level assist for ambulation distance of ~1000 feet while holding onto IV pole. Slightly unsteady but no LOB. Will plan to keep pt on caseload to improve overall mobility and independence with mobility since pt lives alone. Will attempt to practice steps as well since pt has a full flight to get up to her apartment. Pt is also requesting PT keep her on caseload as well (she wants to mobilize as much as possible). Encouraged pt to continue to request assistance from nursing as well.     Follow Up Recommendations No PT follow up    Equipment Recommendations  None recommended by PT    Recommendations for Other Services       Precautions / Restrictions Precautions Precautions: None Restrictions Weight Bearing Restrictions: No      Mobility  Bed Mobility Overal bed mobility: Modified Independent                Transfers Overall transfer level: Modified independent                  Ambulation/Gait Ambulation/Gait assistance: Supervision Ambulation Distance (Feet): 1000 Feet Assistive device:  (IV pole) Gait Pattern/deviations: Step-through pattern;Wide base of support        Stairs            Wheelchair Mobility    Modified Rankin (Stroke Patients Only)       Balance Overall balance assessment: Needs assistance         Standing balance support: During functional activity;Single extremity supported Standing balance-Leahy Scale: Good                               Pertinent Vitals/Pain Pain Assessment: 0-10 Pain Score: 3  Pain Location: abdomen Pain Descriptors / Indicators: Sore Pain Intervention(s): Monitored during session;PCA encouraged    Home Living Family/patient  expects to be discharged to:: Private residence Living Arrangements: Alone Available Help at Discharge: Family   Home Access: Stairs to enter Entrance Stairs-Rails: Left Entrance Stairs-Number of Steps: 13 Home Layout: One level;Able to live on main level with bedroom/bathroom Home Equipment: None      Prior Function Level of Independence: Independent               Hand Dominance        Extremity/Trunk Assessment   Upper Extremity Assessment: Overall WFL for tasks assessed           Lower Extremity Assessment: Overall WFL for tasks assessed      Cervical / Trunk Assessment: Normal  Communication   Communication: No difficulties  Cognition Arousal/Alertness: Awake/alert Behavior During Therapy: WFL for tasks assessed/performed Overall Cognitive Status: Within Functional Limits for tasks assessed                      General Comments      Exercises        Assessment/Plan    PT Assessment Patient needs continued PT services  PT Diagnosis Difficulty walking;Acute pain   PT Problem List Decreased activity tolerance;Decreased balance;Decreased mobility;Pain  PT Treatment Interventions Gait training;Stair training;Functional mobility training;Therapeutic activities;Therapeutic exercise;Patient/family education;Balance training   PT Goals (Current goals can be found in the Care Plan section)  Acute Rehab PT Goals Patient Stated Goal: less pain. home soon PT Goal Formulation: With patient Time For Goal Achievement: 09/10/14 Potential to Achieve Goals: Good    Frequency Min 3X/week   Barriers to discharge        Co-evaluation               End of Session   Activity Tolerance: Patient tolerated treatment well Patient left: in bed;with call bell/phone within reach;with family/visitor present           Time: 9628-3662 PT Time Calculation (min) (ACUTE ONLY): 24 min   Charges:   PT Evaluation $Initial PT Evaluation Tier I: 1  Procedure PT Treatments $Gait Training: 8-22 mins   PT G Codes:        Weston Anna, MPT Pager: [phone]

## 2014-09-04 LAB — BASIC METABOLIC PANEL
Anion gap: 8 (ref 5–15)
BUN: <5 mg/dL — ABNORMAL LOW (ref 6–23)
CO2: 30 mmol/L (ref 19–32)
Calcium: 9.1 mg/dL (ref 8.4–10.5)
Chloride: 102 mmol/L (ref 96–112)
Creatinine, Ser: 0.66 mg/dL (ref 0.50–1.10)
GFR calc Af Amer: >90 mL/min (ref >90)
GFR calc non Af Amer: >90 mL/min (ref >90)
Glucose, Bld: 102 mg/dL — ABNORMAL HIGH (ref 70–99)
Potassium: 4.1 mmol/L (ref 3.5–5.1)
Sodium: 140 mmol/L (ref 135–145)

## 2014-09-04 LAB — CBC
HCT: 33 % — ABNORMAL LOW (ref 36.0–46.0)
Hemoglobin: 10.3 g/dL — ABNORMAL LOW (ref 12.0–15.0)
MCH: 29 pg (ref 26.0–34.0)
MCHC: 31.2 g/dL (ref 30.0–36.0)
MCV: 93 fL (ref 78.0–100.0)
Platelets: 265 10*3/uL (ref 150–400)
RBC: 3.55 MIL/uL — ABNORMAL LOW (ref 3.87–5.11)
RDW: 14.1 % (ref 11.5–15.5)
WBC: 7.5 10*3/uL (ref 4.0–10.5)

## 2014-09-04 MED ORDER — IBUPROFEN 200 MG PO TABS
400.0000 mg | ORAL_TABLET | Freq: Four times a day (QID) | ORAL | Status: DC | PRN
  Administered 2014-09-04: 800 mg via ORAL
  Administered 2014-09-06: 400 mg via ORAL
  Filled 2014-09-04: qty 2
  Filled 2014-09-04: qty 4
  Filled 2014-09-04: qty 2

## 2014-09-04 NOTE — Progress Notes (Signed)
2 Days Post-Op lap colostomy reversal Subjective: Doing well.  Pain controlled.  No nausea.  Passing some flatus  Objective: Vital signs in last 24 hours: Temp:  [97.5 F (36.4 C)-98.2 F (36.8 C)] 97.5 F (36.4 C) (01/29 0508) Pulse Rate:  [72-84] 78 (01/29 0508) Resp:  [13-17] 13 (01/29 0508) BP: (101-116)/(51-55) 116/52 mmHg (01/29 0508) SpO2:  [94 %-99 %] 97 % (01/29 0508)   Intake/Output from previous day: 01/28 0701 - 01/29 0700 In: 1928.8 [P.O.:180; I.V.:1748.8] Out: 2750 [Urine:2750] Intake/Output this shift:     General appearance: alert and cooperative GI: soft, distended  Incision: no significant drainage  Lab Results:   Recent Labs  09/03/14 0550 09/04/14 0500  WBC 10.0 7.5  HGB 10.3* 10.3*  HCT 31.3* 33.0*  PLT 306 265   BMET  Recent Labs  09/03/14 0550 09/04/14 0500  NA 137 140  K 4.0 4.1  CL 101 102  CO2 28 30  GLUCOSE 110* 102*  BUN 6 <5*  CREATININE 0.71 0.66  CALCIUM 8.9 9.1   PT/INR No results for input(s): LABPROT, INR in the last 72 hours. ABG No results for input(s): PHART, HCO3 in the last 72 hours.  Invalid input(s): PCO2, PO2  MEDS, Scheduled . alvimopan  12 mg Oral BID  . enoxaparin (LOVENOX) injection  40 mg Subcutaneous Q24H  . morphine   Intravenous 6 times per day  . pantoprazole  80 mg Oral Daily    Studies/Results: No results found.  Assessment: s/p Procedure(s): LAPAROSCOPIC COLOSTOMY REVERSAL Patient Active Problem List   Diagnosis Date Noted  . Colostomy in place 09/02/2014  . Abdominal pain 04/28/2014  . S/P colectomy 04/28/2014  . HYPERLIPIDEMIA 07/21/2010  . DEPRESSION 07/21/2010  . GERD 07/21/2010  . PNEUMONIA, HX OF 07/21/2010    Expected post op course  Plan: Decrease MIV, good UOP Ambulate Dressing changes bid Cont npo until better return of bowel function, pt still rather distended Ibuprofen for headaches    LOS: 2 days     .Rosario Adie, [facility],  [place]   09/04/2014 7:40 AM

## 2014-09-04 NOTE — Progress Notes (Addendum)
Physical Therapy Treatment/DC from PT Patient Details Name: [name] MRN: [id number] DOB: December 03, 1950 Today's Date: 09/04/2014    History of Present Illness 64 yo female s/p lap colostomy reversal 09/02/14.     PT Comments    Follow up session to practice steps and progression with ambulation. Pt mobilizing very well. Able to walk in hallways unassisted/unsupported on today. Practiced stair negotiation-pt performed well. No further PT needs. Will sign off.   Follow Up Recommendations  No PT follow up     Equipment Recommendations  None recommended by PT    Recommendations for Other Services       Precautions / Restrictions Precautions Precautions: None Restrictions Weight Bearing Restrictions: No    Mobility  Bed Mobility               General bed mobility comments: oob in recliner  Transfers Overall transfer level: Modified independent                  Ambulation/Gait Ambulation/Gait assistance: Modified independent (Device/Increase time) Ambulation Distance (Feet): 150 Feet Assistive device: None Gait Pattern/deviations: WFL(Within Functional Limits)         Stairs Stairs: Yes Stairs assistance: Supervision Stair Management: One rail Right;Alternating pattern;Forwards Number of Stairs: 7 General stair comments: supervision for lines only.   Wheelchair Mobility    Modified Rankin (Stroke Patients Only)       Balance                                    Cognition Arousal/Alertness: Awake/alert Behavior During Therapy: WFL for tasks assessed/performed Overall Cognitive Status: Within Functional Limits for tasks assessed                      Exercises      General Comments        Pertinent Vitals/Pain Pain Assessment: 0-10 Pain Score: 3  Pain Location: abdomen Pain Descriptors / Indicators: Sore Pain Intervention(s): Monitored during session;PCA encouraged    Home Living                       Prior Function            PT Goals (current goals can now be found in the care plan section) Progress towards PT goals: Goals met/education completed, patient discharged from PT    Frequency       PT Plan Current plan remains appropriate    Co-evaluation             End of Session   Activity Tolerance: Patient tolerated treatment well Patient left: in chair;with call bell/phone within reach     Time: 1442-1455 PT Time Calculation (min) (ACUTE ONLY): 13 min  Charges:  $Gait Training: 8-22 mins                    G Codes:      Weston Anna, MPT Pager: [phone]

## 2014-09-05 LAB — BASIC METABOLIC PANEL
Anion gap: 10 (ref 5–15)
BUN: <5 mg/dL — ABNORMAL LOW (ref 6–23)
CO2: 28 mmol/L (ref 19–32)
Calcium: 9.4 mg/dL (ref 8.4–10.5)
Chloride: 102 mmol/L (ref 96–112)
Creatinine, Ser: 0.75 mg/dL (ref 0.50–1.10)
GFR calc Af Amer: >90 mL/min (ref >90)
GFR calc non Af Amer: 89 mL/min — ABNORMAL LOW (ref >90)
Glucose, Bld: 107 mg/dL — ABNORMAL HIGH (ref 70–99)
Potassium: 4.1 mmol/L (ref 3.5–5.1)
Sodium: 140 mmol/L (ref 135–145)

## 2014-09-05 LAB — CBC
HCT: 33.4 % — ABNORMAL LOW (ref 36.0–46.0)
Hemoglobin: 10.9 g/dL — ABNORMAL LOW (ref 12.0–15.0)
MCH: 29.9 pg (ref 26.0–34.0)
MCHC: 32.6 g/dL (ref 30.0–36.0)
MCV: 91.5 fL (ref 78.0–100.0)
Platelets: 264 10*3/uL (ref 150–400)
RBC: 3.65 MIL/uL — ABNORMAL LOW (ref 3.87–5.11)
RDW: 13.5 % (ref 11.5–15.5)
WBC: 6.7 10*3/uL (ref 4.0–10.5)

## 2014-09-05 NOTE — Progress Notes (Signed)
Patient ID: [name], female   DOB: Oct 18, 1950, 64 y.o.   MRN: [id number] 3 Days Post-Op  Subjective: No major complaints. A little bloated and some occasional crampy abdominal pain. Was nauseated yesterday but not today. Has had some flatus and passed a little blood and mucus per rectum.  Objective: Vital signs in last 24 hours: Temp:  [97.2 F (36.2 C)-98.6 F (37 C)] 98.6 F (37 C) (01/30 0755) Pulse Rate:  [79-89] 89 (01/30 0755) Resp:  [11-18] 14 (01/30 0800) BP: (108-129)/(49-73) 110/49 mmHg (01/30 0755) SpO2:  [95 %-100 %] 98 % (01/30 0800) Last BM Date: 09/01/14  Intake/Output from previous day: 01/29 0701 - 01/30 0700 In: 1271.7 [I.V.:1271.7] Out: 0  Intake/Output this shift:    General appearance: alert, cooperative and no distress GI: no significant distention. Mild left lower quadrant tenderness without guarding Incision/Wound: dressing clean and dry  Lab Results:   Recent Labs  09/04/14 0500 09/05/14 0500  WBC 7.5 6.7  HGB 10.3* 10.9*  HCT 33.0* 33.4*  PLT 265 264   BMET  Recent Labs  09/04/14 0500 09/05/14 0500  NA 140 140  K 4.1 4.1  CL 102 102  CO2 30 28  GLUCOSE 102* 107*  BUN <5* <5*  CREATININE 0.66 0.75  CALCIUM 9.1 9.4     Studies/Results: No results found.  Anti-infectives: Anti-infectives    Start     Dose/Rate Route Frequency Ordered Stop   09/02/14 2200  cefoTEtan (CEFOTAN) 2 g in dextrose 5 % 50 mL IVPB     2 g100 mL/hr over 30 Minutes Intravenous Every 12 hours 09/02/14 1554 09/02/14 2214   09/02/14 0926  cefoTEtan (CEFOTAN) 2 g in dextrose 5 % 50 mL IVPB     2 g100 mL/hr over 30 Minutes Intravenous On call to O.R. 09/02/14 0926 09/02/14 1057      Assessment/Plan: s/p Procedure(s): LAPAROSCOPIC COLOSTOMY REVERSAL Stable without apparent complication. Start clear liquid diet. Ambulation encouraged.   LOS: 3 days    Tieshia Rettinger T 09/05/2014

## 2014-09-06 MED ORDER — ALUM & MAG HYDROXIDE-SIMETH 200-200-20 MG/5ML PO SUSP: 30.0000 mL | Freq: Four times a day (QID) | ORAL | Status: DC | PRN

## 2014-09-06 MED ORDER — SODIUM CHLORIDE 0.9 % IV SOLN: 250.0000 mL | INTRAVENOUS | Status: DC | PRN

## 2014-09-06 MED ORDER — LACTATED RINGERS IV BOLUS (SEPSIS): 1000.0000 mL | Freq: Three times a day (TID) | INTRAVENOUS | Status: DC | PRN

## 2014-09-06 MED ORDER — DIPHENHYDRAMINE HCL 50 MG/ML IJ SOLN: 12.5000 mg | Freq: Four times a day (QID) | INTRAMUSCULAR | Status: DC | PRN

## 2014-09-06 MED ORDER — MENTHOL 3 MG MT LOZG: 1.0000 | LOZENGE | OROMUCOSAL | Status: DC | PRN

## 2014-09-06 MED ORDER — ACETAMINOPHEN 325 MG PO TABS: 325.0000 mg | ORAL_TABLET | Freq: Four times a day (QID) | ORAL | Status: DC | PRN

## 2014-09-06 MED ORDER — SODIUM CHLORIDE 0.9 % IJ SOLN: 3.0000 mL | INTRAMUSCULAR | Status: DC | PRN

## 2014-09-06 MED ORDER — SODIUM CHLORIDE 0.9 % IJ SOLN
3.0000 mL | Freq: Two times a day (BID) | INTRAMUSCULAR | Status: DC
Start: 2014-09-06 — End: 2014-09-07

## 2014-09-06 MED ORDER — PHENOL 1.4 % MT LIQD: 2.0000 | OROMUCOSAL | Status: DC | PRN

## 2014-09-06 MED ORDER — LIP MEDEX EX OINT
1.0000 | TOPICAL_OINTMENT | Freq: Two times a day (BID) | CUTANEOUS | Status: DC
Start: 2014-09-06 — End: 2014-09-07
  Administered 2014-09-06 (×2): 1 via TOPICAL
  Filled 2014-09-06: qty 7

## 2014-09-06 MED ORDER — ACETAMINOPHEN 650 MG RE SUPP: 650.0000 mg | Freq: Four times a day (QID) | RECTAL | Status: DC | PRN

## 2014-09-06 MED ORDER — OXYCODONE-ACETAMINOPHEN 5-325 MG PO TABS: 1.0000 | ORAL_TABLET | ORAL | Status: DC | PRN

## 2014-09-06 MED ORDER — MAGIC MOUTHWASH
15.0000 mL | Freq: Four times a day (QID) | ORAL | Status: DC | PRN
  Filled 2014-09-06: qty 15

## 2014-09-06 NOTE — Discharge Instructions (Signed)

## 2014-09-06 NOTE — Progress Notes (Signed)
Patient ID: [name], female   DOB: 09/25/1950, 64 y.o.   MRN: [id number] 4 Days Post-Op  Subjective: Feels well. No complaints. Has had several loose stools. Tolerating clear liquids without difficulty.  Objective: Vital signs in last 24 hours: Temp:  [98.2 F (36.8 C)-99 F (37.2 C)] 98.5 F (36.9 C) (01/31 0627) Pulse Rate:  [78-91] 84 (01/31 0627) Resp:  [13-26] 16 (01/31 0756) BP: (118-122)/(54-78) 118/67 mmHg (01/31 0627) SpO2:  [94 %-100 %] 98 % (01/31 0756) Last BM Date: 09/01/14  Intake/Output from previous day: 01/30 0701 - 01/31 0700 In: 9528 [P.O.:570; I.V.:1185] Out: 1450 [Urine:1450] Intake/Output this shift: Total I/O In: 240 [P.O.:240] Out: 0   General appearance: alert, cooperative and no distress GI: normal findings: soft, non-tender Incision/Wound: clean and dry  Lab Results:   Recent Labs  09/04/14 0500 09/05/14 0500  WBC 7.5 6.7  HGB 10.3* 10.9*  HCT 33.0* 33.4*  PLT 265 264   BMET  Recent Labs  09/04/14 0500 09/05/14 0500  NA 140 140  K 4.1 4.1  CL 102 102  CO2 30 28  GLUCOSE 102* 107*  BUN <5* <5*  CREATININE 0.66 0.75  CALCIUM 9.1 9.4     Studies/Results: No results found.  Anti-infectives: Anti-infectives    Start     Dose/Rate Route Frequency Ordered Stop   09/02/14 2200  cefoTEtan (CEFOTAN) 2 g in dextrose 5 % 50 mL IVPB     2 g100 mL/hr over 30 Minutes Intravenous Every 12 hours 09/02/14 1554 09/02/14 2214   09/02/14 0926  cefoTEtan (CEFOTAN) 2 g in dextrose 5 % 50 mL IVPB     2 g100 mL/hr over 30 Minutes Intravenous On call to O.R. 09/02/14 0926 09/02/14 1057      Assessment/Plan: s/p Procedure(s): LAPAROSCOPIC COLOSTOMY REVERSAL Doing very well without complication. Advance diet. Change to oral pain medication. Anticipate discharge tomorrow.   LOS: 4 days    Kaina Orengo T 09/06/2014

## 2014-09-07 MED ORDER — OXYCODONE-ACETAMINOPHEN 5-325 MG PO TABS: 1.0000 | ORAL_TABLET | ORAL | Status: DC | PRN

## 2014-09-07 NOTE — Discharge Summary (Signed)
  Patient ID: [name] [id number] 63 y.o. February 02, 1951  09/02/2014  Discharge date and time: No discharge date for patient encounter.  Admitting Physician: Rosario Adie  Discharge Physician: Rosario Adie.  Admission Diagnoses: s/p colonic perforation  Discharge Diagnoses: same  Operations: Procedure(s): LAPAROSCOPIC COLOSTOMY REVERSAL    Discharged Condition: good    Hospital Course: Patient admitted after reversal of colostomy.  She had a slight ileus that resolved with time.  By POD 5 she was tolerating a diet and having bowel function.  She was deemed in stable condition for discharge to home.    Consults: None  Significant Diagnostic Studies: labs: cbc, chemistry   Treatments: IV hydration, analgesia: acetaminophen w/ codeine and surgery: see above  Disposition: Home

## 2014-09-07 NOTE — Progress Notes (Signed)
Assessment unchanged. Pt verbalized understanding of dc instructions through teach back. Script x 1 given as provided by MD. Discharged via wc to front entrance to meet awaiting vehicle and friend to carry home. Accompanied by friend and Stage manager from [facility].

## 2014-10-01 ENCOUNTER — Telehealth: Payer: Self-pay | Admitting: Obstetrics & Gynecology

## 2014-10-01 NOTE — Telephone Encounter (Signed)
Patient is asking for name of plastic surgeon Dr.Miller recommended for scar revision. Last seen 05/29/14.

## 2014-10-02 NOTE — Telephone Encounter (Signed)
I do not see a name specifically mentioned by Dr. Sabra Heck in office visit note from 05/29/14 and no referral was placed.   Message left to return call to [place] at [phone] to advise that I am sending a message to Dr. Sabra Heck to review when back in office and will call back with response.   Routing to Dr. Sabra Heck.    cc Dr. Quincy Simmonds covering provider.

## 2014-10-02 NOTE — Telephone Encounter (Signed)
Pt returning call

## 2014-10-02 NOTE — Telephone Encounter (Signed)
Spoke with patient. Advised that would send message to Dr. Sabra Heck to review and advise. Patient agreeable.

## 2014-10-05 NOTE — Telephone Encounter (Signed)
Spoke with patient. Advised patient of message as seen below from [place]. Patient is agreeable. "I have actually already set up an appointment with him so that is great."  Routing to provider for final review. Patient agreeable to disposition. Will close encounter

## 2014-10-05 NOTE — Telephone Encounter (Signed)
Crissie Reese, MD, at [facility].  He is located on [address].

## 2015-01-09 ENCOUNTER — Ambulatory Visit (INDEPENDENT_AMBULATORY_CARE_PROVIDER_SITE_OTHER): Payer: 59

## 2015-01-09 ENCOUNTER — Ambulatory Visit (INDEPENDENT_AMBULATORY_CARE_PROVIDER_SITE_OTHER): Payer: 59 | Admitting: Family Medicine

## 2015-01-09 VITALS — BP 128/78 | HR 79 | Temp 98.7°F | Resp 16 | Ht 66.5 in | Wt 160.0 lb

## 2015-01-09 DIAGNOSIS — K5660 Unspecified intestinal obstruction: Secondary | ICD-10-CM

## 2015-01-09 DIAGNOSIS — R1084 Generalized abdominal pain: Secondary | ICD-10-CM

## 2015-01-09 LAB — POCT CBC
Granulocyte percent: 57.1 %G (ref 37–80)
HCT, POC: 38.7 % (ref 37.7–47.9)
Hemoglobin: 12.5 g/dL (ref 12.2–16.2)
Lymph, poc: 1.8 (ref 0.6–3.4)
MCH, POC: 28.9 pg (ref 27–31.2)
MCHC: 32.3 g/dL (ref 31.8–35.4)
MCV: 89.7 fL (ref 80–97)
MID (cbc): 0.4 (ref 0–0.9)
MPV: 6.9 fL (ref 0–99.8)
POC Granulocyte: 3 (ref 2–6.9)
POC LYMPH PERCENT: 34.4 %L (ref 10–50)
POC MID %: 8.5 %M (ref 0–12)
Platelet Count, POC: 308 10*3/uL (ref 142–424)
RBC: 4.31 M/uL (ref 4.04–5.48)
RDW, POC: 13.6 %
WBC: 5.2 10*3/uL (ref 4.6–10.2)

## 2015-01-09 LAB — POCT URINALYSIS DIPSTICK
Bilirubin, UA: NEGATIVE
Blood, UA: NEGATIVE
Glucose, UA: NEGATIVE
Ketones, UA: NEGATIVE
Nitrite, UA: NEGATIVE
Protein, UA: NEGATIVE
Spec Grav, UA: 1.01
Urobilinogen, UA: 0.2
pH, UA: 6

## 2015-01-09 LAB — POCT UA - MICROSCOPIC ONLY
Bacteria, U Microscopic: NEGATIVE
Casts, Ur, LPF, POC: NEGATIVE
Crystals, Ur, HPF, POC: NEGATIVE
Mucus, UA: NEGATIVE
WBC, Ur, HPF, POC: NEGATIVE
Yeast, UA: NEGATIVE

## 2015-01-09 NOTE — Patient Instructions (Signed)
Always drink plenty of fluids  Take some MiraLAX to try and get bowel contents moving through gently. If you are unable to pass some good stools go ahead and use a fleets enema or Dulcolax suppository.  If pains are getting intensely worse you would need reevaluation by the surgeon and consideration for CT scanning, but at this point do not believe that is indicated.  In the long run recommend regular walking as a best way to keep the bowels moving through.  Return at anytime if needed

## 2015-01-09 NOTE — Progress Notes (Signed)
Subjective:  Patient ID: [name], female    DOB: 01-30-1951  Age: 64 y.o. MRN: [id number]  64 year old lady who works at [employer] as a Marine scientist. She had a colonoscopy last year and suffered a perforation. She had colonostomy and later had to have a reversal of it done in January. Last few days she's developed more abdominal pain. Apparently she had C. difficile that was treated outpatient for several weeks after the last surgery. She has been eating fairly well, usually eating at the hospital, since that surgery. Yesterday she had a couple of episodes of diarrhea, which is really not too out of the ordinary for her. However she developed bad pain which increased. Last night is very severe in the mid abdomen and left upper abdomen. No nausea or vomiting. She had excessive bowel sounds. She rated the pain 10 out of 10 like labor pain. She denied dysuria. She has not had any fever. She called her surgeon and they told her come over here and get checked first.  Objective:   Pleasant lady alert and oriented in no obvious distress at this time though she still is having pain. Throat clear. Mucous membranes moist. Neck supple without nodes. Her chest is clear. Heart regular without murmurs gallops or arrhythmias. Abdomen has very active bowel sounds with some rushes but no tinkling extensive scarring of the abdominal wall from the surgeries. The abdomen is generally tender, more in the mid and left central abdomen. No rebound. No rigidity.   Results for orders placed or performed in visit on 01/09/15  POCT CBC  Result Value Ref Range   WBC 5.2 4.6 - 10.2 K/uL   Lymph, poc 1.8 0.6 - 3.4   POC LYMPH PERCENT 34.4 10 - 50 %L   MID (cbc) 0.4 0 - 0.9   POC MID % 8.5 0 - 12 %M   POC Granulocyte 3.0 2 - 6.9   Granulocyte percent 57.1 37 - 80 %G   RBC 4.31 4.04 - 5.48 M/uL   Hemoglobin 12.5 12.2 - 16.2 g/dL   HCT, POC 38.7 37.7 - 47.9 %   MCV 89.7 80 - 97 fL   MCH, POC 28.9 27 - 31.2 pg   MCHC  32.3 31.8 - 35.4 g/dL   RDW, POC 13.6 %   Platelet Count, POC 308 142 - 424 K/uL   MPV 6.9 0 - 99.8 fL  POCT UA - Microscopic Only  Result Value Ref Range   WBC, Ur, HPF, POC negative    RBC, urine, microscopic 0-1    Bacteria, U Microscopic negative    Mucus, UA negative    Epithelial cells, urine per micros 0-2    Crystals, Ur, HPF, POC negative    Casts, Ur, LPF, POC negative    Yeast, UA negative   POCT urinalysis dipstick  Result Value Ref Range   Color, UA yellow    Clarity, UA clear    Glucose, UA neg    Bilirubin, UA neg    Ketones, UA neg    Spec Grav, UA 1.010    Blood, UA neg    pH, UA 6.0    Protein, UA neg    Urobilinogen, UA 0.2    Nitrite, UA neg    Leukocytes, UA Trace    [facility] reading (PRIMARY) by  Dr. Linna [name] Nonspecific gas stools pattern. No free air. No air-fluid levels. Noted is fact there is scoliosis of the lumbar spine.     Assessment &  Plan:   Assessment: Generalized abdominal pain, probably secondary to intermittent gas and stool trapping from old adhesions without defined blockages  Plan: Patient Instructions  Always drink plenty of fluids  Take some MiraLAX to try and get bowel contents moving through gently. If you are unable to pass some good stools go ahead and use a fleets enema or Dulcolax suppository.  If pains are getting intensely worse you would need reevaluation by the surgeon and consideration for CT scanning, but at this point do not believe that is indicated.  In the long run recommend regular walking as a best way to keep the bowels moving through.  Return at anytime if needed     Jo Gallina, MD 01/09/2015

## 2015-02-01 ENCOUNTER — Other Ambulatory Visit: Payer: Self-pay

## 2015-02-02 ENCOUNTER — Other Ambulatory Visit: Payer: Self-pay | Admitting: Obstetrics & Gynecology

## 2015-02-02 NOTE — Telephone Encounter (Signed)
Medication refill request: Eszopiclone  Last AEX:  05/29/14 SM Next AEX: Not scheduled  Last MMG (if hormonal medication request): 06/18/13 [id number] Refill authorized: 05/29/14 #[id number]/ 2R. Today please advise.

## 2015-02-11 NOTE — Telephone Encounter (Signed)
Rx printed and faxed to [facility].

## 2015-05-04 ENCOUNTER — Other Ambulatory Visit: Payer: Self-pay | Admitting: Obstetrics & Gynecology

## 2015-05-04 DIAGNOSIS — Z1231 Encounter for screening mammogram for malignant neoplasm of breast: Secondary | ICD-10-CM

## 2015-05-06 ENCOUNTER — Ambulatory Visit (HOSPITAL_COMMUNITY)
Admission: RE | Admit: 2015-05-06 | Discharge: 2015-05-06 | Disposition: A | Discharge status: Home / Self Care | Payer: 59 | Source: Ambulatory Visit | Attending: Obstetrics & Gynecology | Admitting: Obstetrics & Gynecology

## 2015-05-06 ENCOUNTER — Other Ambulatory Visit: Payer: Self-pay | Admitting: Obstetrics & Gynecology

## 2015-05-06 DIAGNOSIS — Z1231 Encounter for screening mammogram for malignant neoplasm of breast: Secondary | ICD-10-CM

## 2015-06-01 ENCOUNTER — Encounter (HOSPITAL_BASED_OUTPATIENT_CLINIC_OR_DEPARTMENT_OTHER): Payer: Self-pay

## 2015-06-01 ENCOUNTER — Ambulatory Visit (HOSPITAL_BASED_OUTPATIENT_CLINIC_OR_DEPARTMENT_OTHER): Admit: 2015-06-01 | Payer: 59 | Admitting: Plastic Surgery

## 2015-06-01 HISTORY — PX: OTHER SURGICAL HISTORY: SHX169

## 2015-06-01 SURGERY — ABDOMINOPLASTY
Anesthesia: General

## 2015-06-14 ENCOUNTER — Encounter: Payer: Self-pay | Admitting: Obstetrics & Gynecology

## 2015-06-14 ENCOUNTER — Ambulatory Visit (INDEPENDENT_AMBULATORY_CARE_PROVIDER_SITE_OTHER): Payer: 59 | Admitting: Obstetrics & Gynecology

## 2015-06-14 VITALS — BP 110/76 | HR 72 | Resp 16 | Ht 66.5 in | Wt 154.0 lb

## 2015-06-14 DIAGNOSIS — Z0000 Encounter for general adult medical examination without abnormal findings: Secondary | ICD-10-CM | POA: Diagnosis not present

## 2015-06-14 DIAGNOSIS — E2839 Other primary ovarian failure: Secondary | ICD-10-CM

## 2015-06-14 DIAGNOSIS — E559 Vitamin D deficiency, unspecified: Secondary | ICD-10-CM

## 2015-06-14 DIAGNOSIS — Z01419 Encounter for gynecological examination (general) (routine) without abnormal findings: Secondary | ICD-10-CM

## 2015-06-14 LAB — POCT URINALYSIS DIPSTICK
Bilirubin, UA: NEGATIVE
Blood, UA: NEGATIVE
Glucose, UA: NEGATIVE
Ketones, UA: NEGATIVE
Nitrite, UA: NEGATIVE
Protein, UA: NEGATIVE
Urobilinogen, UA: NEGATIVE
pH, UA: 5

## 2015-06-14 LAB — HEMOGLOBIN, FINGERSTICK: Hemoglobin, fingerstick: 11.9 g/dL — ABNORMAL LOW (ref 12.0–16.0)

## 2015-06-14 MED ORDER — ESZOPICLONE 3 MG PO TABS: 3.0000 mg | ORAL_TABLET | Freq: Every day | ORAL | Status: DC

## 2015-06-14 NOTE — Progress Notes (Signed)
64 y.o. G7P2 SingleCaucasianF here for annual exam.  Doing better from GI standpoint.  Had coloscopy takedown in January with Dr. Marcello Moores and scar revision 06/01/15 with Dr. Iran Planas.  Sister died with Esophageal cancer this fall.  Father had a gangrenous small bowel and then had bowel resection.  He had gangrenous gallbladder removed in March.  This was a result of the gallbladder surgery.  Father is still hospitalized in [place] as a result of the surgery.  PCP retired.  Needs suggested.    Patient's last menstrual period was 08/07/1998.          Sexually active: No.  The current method of family planning is status post hysterectomy.    Exercising: No.  not regularly Smoker:  Occasional smoker  Health Maintenance: Pap:  ?2009 History of abnormal Pap:  no MMG:  05/06/15 3D-BiRads 1 negative Colonoscopy:  1/16 and EGD with Dr. Ardis Hughs.  Repeat colonoscopy 5 years.  Pt states she will not do another colonoscopy.   BMD:   6/10 TDaP:  2009 Screening Labs: done 9/15, Hb today: not done, Urine today: WBC-2+   reports that she has been smoking Cigarettes.  She has never used smokeless tobacco. She reports that she drinks about 2.4 oz of alcohol per week. She reports that she does not use illicit drugs.  Past Medical History  Diagnosis Date  . Depression   . GERD (gastroesophageal reflux disease)   . Hyperlipidemia   . Anxiety   . Vitamin D deficiency   . Suicide attempt ([place])   . Insomnia     periodically uses med.  . Perforated sigmoid colon ([place]) 04/28/14    during colonoscopy, "rupture colon"became septic, has colostomy and open surgical wound  . Complication of anesthesia     past hx. laryngospasm following 2 past surgeries, not recent    Past Surgical History  Procedure Laterality Date  . Abdominal hysterectomy  1999    TAH/BSO  . Elbow surgery  2007  . Ulnar tunnel release Left 2010  . Blepharoplasty  2010  . Cosmetic surgery    . Debridement tennis elbow    . Breast  surgery  2001    Breast reduction  . Dilation and curettage of uterus      x3, hysteroscopy  . Laparotomy N/A 04/28/2014    Procedure: EXPLORATORY LAPAROTOMY,SIGMOID COLECTOMY;  Surgeon: Rolm Bookbinder, MD;  Location: [place];  Service: General;  Laterality: N/A;  . Colostomy N/A 04/28/2014    Procedure: COLOSTOMY;  Surgeon: Rolm Bookbinder, MD;  Location: [place];  Service: General;  Laterality: N/A;  . Application of wound vac  04/28/2014    Procedure: APPLICATION OF WOUND VAC;  Surgeon: Rolm Bookbinder, MD;  Location: [place];  Service: General;;  . Colostomy revision N/A 04/28/2014    Procedure: COLON RESECTION SIGMOID;  Surgeon: Rolm Bookbinder, MD;  Location: [place];  Service: General;  Laterality: N/A;  . Eye surgery      Blepharoplasty  . Colostomy takedown N/A 09/02/2014    Procedure: LAPAROSCOPIC COLOSTOMY REVERSAL;  Surgeon: [name], MD;  Location: [facility];  Service: General;  Laterality: N/A;  . Scar and adhesion repair  06/01/15    and liposuction    Current Outpatient Prescriptions  Medication Sig Dispense Refill  . esomeprazole (NEXIUM) 40 MG capsule Take 40 mg by mouth 2 (two) times daily before a meal.    . esomeprazole (NEXIUM) 40 MG packet Take 40 mg by mouth 2 (two) times daily before a  meal. 60 each 12  . Eszopiclone 3 MG TABS TAKE 1 TABLET BY MOUTH ONCE AT BEDTIME AS NEEDED FOR SLEEP 30 tablet 1  . ibuprofen (ADVIL,MOTRIN) 200 MG tablet Take 800 mg by mouth every 6 (six) hours as needed for mild pain or moderate pain.    . ranitidine (ZANTAC) 150 MG tablet Take 150 mg by mouth as needed for heartburn.    . valACYclovir (VALTREX) 500 MG tablet As needed  10   No current facility-administered medications for this visit.    Family History  Problem Relation Age of Onset  . Hyperlipidemia Father   . Hypertension Father   . Diabetes Father   . Cancer Father     History of bladder cancer  . Hypertension Mother   . Diabetes Mother   . Heart disease Maternal  Grandmother   . Stroke Maternal Grandfather   . Hyperlipidemia Brother   . Cancer Paternal Grandfather     pancreatic cancer  . Cancer Paternal Grandmother     uterine  . Heart disease Father   . Esophageal cancer Sister   . Other Father     4 ft intestinal removed-had gangrene in gallbladder, then spread to intestines    ROS:  Pertinent items are noted in HPI.  Otherwise, a comprehensive ROS was negative.  Exam:   BP 110/76 mmHg  Pulse 72  Resp 16  Ht 5' 6.5" (1.689 m)  Wt 154 lb (69.854 kg)  BMI 24.49 kg/m2  LMP 08/07/1998  Weight change: +3#   Height: 5' 6.5" (168.9 cm)  Ht Readings from Last 3 Encounters:  06/14/15 5' 6.5" (1.689 m)  01/09/15 5' 6.5" (1.689 m)  09/02/14 5' 6.5" (1.689 m)    General appearance: alert, cooperative and appears stated age Head: Normocephalic, without obvious abnormality, atraumatic Neck: no adenopathy, supple, symmetrical, trachea midline and thyroid normal to inspection and palpation Lungs: clear to auscultation bilaterally Breasts: normal appearance, no masses or tenderness Heart: regular rate and rhythm Abdomen: soft, non-tender; bowel sounds normal; no masses,  no organomegaly Extremities: extremities normal, atraumatic, no cyanosis or edema Skin: Skin color, texture, turgor normal. No rashes or lesions Lymph nodes: Cervical, supraclavicular, and axillary nodes normal. No abnormal inguinal nodes palpated Neurologic: Grossly normal   Pelvic: External genitalia:  no lesions              Urethra:  normal appearing urethra with no masses, tenderness or lesions              Bartholins and Skenes: normal                 Vagina: normal appearing vagina with normal color and discharge, no lesions              Cervix: absent              Pap taken: No. Bimanual Exam:  Uterus:  uterus absent              Adnexa: no mass, fullness, tenderness               Rectovaginal: Confirms               Anus:  normal sphincter tone, no  lesions  Chaperone was present for exam.  A:  Well Woman with normal exam Elevated lipids.  Had declined and continues to decline medication H/O depression H/O colon perforation with colonoscopy, colostomy take down and now with scar revision  P: Mammogram due.  Appt scheduled. pap smear not indicated. H/O TAH due to fibroids Lunesta 2mg  qhs prn. #30/3 RF Vit D, CBC, CMP, TSH BMD ordered.  Pt will call to schedule. return annually or prn

## 2015-06-15 LAB — COMPREHENSIVE METABOLIC PANEL
ALT: 13 U/L (ref 6–29)
AST: 19 U/L (ref 10–35)
Albumin: 4.3 g/dL (ref 3.6–5.1)
Alkaline Phosphatase: 52 U/L (ref 33–130)
BUN: 8 mg/dL (ref 7–25)
CO2: 23 mmol/L (ref 20–31)
Calcium: 9.4 mg/dL (ref 8.6–10.4)
Chloride: 102 mmol/L (ref 98–110)
Creat: 0.84 mg/dL (ref 0.50–0.99)
Glucose, Bld: 96 mg/dL (ref 65–99)
Potassium: 4.3 mmol/L (ref 3.5–5.3)
Sodium: 139 mmol/L (ref 135–146)
Total Bilirubin: 0.4 mg/dL (ref 0.2–1.2)
Total Protein: 7 g/dL (ref 6.1–8.1)

## 2015-06-15 LAB — VITAMIN D 25 HYDROXY (VIT D DEFICIENCY, FRACTURES): Vit D, 25-Hydroxy: 18 ng/mL — ABNORMAL LOW (ref 30–100)

## 2015-06-15 LAB — LIPID PANEL
Cholesterol: 269 mg/dL — ABNORMAL HIGH (ref 125–200)
HDL: 86 mg/dL (ref >=46)
LDL Cholesterol: 162 mg/dL — ABNORMAL HIGH (ref <130)
Total CHOL/HDL Ratio: 3.1 Ratio (ref <=5.0)
Triglycerides: 107 mg/dL (ref <150)
VLDL: 21 mg/dL (ref <30)

## 2015-06-15 LAB — TSH: TSH: 1.613 u[IU]/mL (ref 0.350–4.500)

## 2015-06-18 MED ORDER — VITAMIN D (ERGOCALCIFEROL) 1.25 MG (50000 UNIT) PO CAPS: 50000.0000 [IU] | ORAL_CAPSULE | ORAL | Status: DC

## 2015-06-18 NOTE — Addendum Note (Signed)
Addended by: Megan Salon on: 06/18/2015 08:47 AM   Modules accepted: Orders, SmartSet

## 2015-06-21 ENCOUNTER — Telehealth: Payer: Self-pay

## 2015-06-21 NOTE — Telephone Encounter (Signed)
Patient notified of results. Will start Rx Vitamin D, recheck appointment made for 09/03/15. Does not wish to start any treatment for cholesterol. States has tried Lipitor in the past and had some joint issues. Aware if she changes her mind, to call back and we can advise.//kn

## 2015-06-21 NOTE — Telephone Encounter (Signed)
Returning call.

## 2015-06-21 NOTE — Telephone Encounter (Signed)
Lmtcb//kn 

## 2015-06-21 NOTE — Telephone Encounter (Signed)
-----   Message from Megan Salon, MD sent at 06/18/2015  8:45 AM EST ----- Please inform pt Vit D low.  Needs 50K weekly for 12 weeks and repeat lab.  Orders placed.  CMP and TSH normal.  Lipids elevated as they have been with LDL elevated.  No real change.  Pt does not want this treated.  Please confirm but I don't think she will change her mind.

## 2015-07-20 ENCOUNTER — Ambulatory Visit
Admission: RE | Admit: 2015-07-20 | Discharge: 2015-07-20 | Disposition: A | Discharge status: Home / Self Care | Payer: 59 | Source: Ambulatory Visit | Attending: Obstetrics & Gynecology | Admitting: Obstetrics & Gynecology

## 2015-07-20 DIAGNOSIS — E2839 Other primary ovarian failure: Secondary | ICD-10-CM

## 2015-07-27 ENCOUNTER — Telehealth: Payer: 59 | Admitting: Family

## 2015-07-27 DIAGNOSIS — J069 Acute upper respiratory infection, unspecified: Secondary | ICD-10-CM

## 2015-07-27 MED ORDER — FLUTICASONE PROPIONATE 50 MCG/ACT NA SUSP: 1.0000 | Freq: Two times a day (BID) | NASAL | Status: DC

## 2015-07-27 MED ORDER — OXYMETAZOLINE HCL 0.05 % NA SOLN: 1.0000 | Freq: Two times a day (BID) | NASAL | Status: DC

## 2015-07-27 MED ORDER — LEVOFLOXACIN 750 MG PO TABS: 750.0000 mg | ORAL_TABLET | Freq: Every day | ORAL | Status: DC

## 2015-07-27 MED ORDER — BENZONATATE 100 MG PO CAPS: 100.0000 mg | ORAL_CAPSULE | Freq: Three times a day (TID) | ORAL | Status: DC | PRN

## 2015-07-27 NOTE — Progress Notes (Signed)
We are sorry that you are not feeling well.  Here is how we plan to help!  Based on what you have shared with me it looks like you have sinusitis.  Sinusitis is inflammation and infection in the sinus cavities of the head.  Based on your presentation I believe you most likely have Acute Bacterial Sinusitis.  This is an infection caused by bacteria and is treated with antibiotics. I have prescribed Levofloxicin 750mg  by mouth once daily for 7 days. You may use an oral decongestant such as Mucinex D or if you have glaucoma or high blood pressure use plain Mucinex. Saline nasal spray help and can safely be used as often as needed for congestion.  If you develop worsening sinus pain, fever or notice severe headache and vision changes, or if symptoms are not better after completion of antibiotic, please schedule an appointment with a health care provider.    *Per our phone conversation: I have also prescribed Fluticasone nasal spray, spray 1 spray twice daily for congestion. I have also prescribed Tessalon Perles 100-200mg  (1-2 capsules) every 8 hours as needed for the cough the you reported is keeping you awake at night. Additionally, I am prescribing Afrin nasal spray to be used every 12 hours for 3 days ONLY. This will help to open the eustachian tubes and help relieve pressure on your ear drums as you mentioned there is extreme pressure there.   Sinus infections are not as easily transmitted as other respiratory infection, however we still recommend that you avoid close contact with loved ones, especially the very young and elderly.  Remember to wash your hands thoroughly throughout the day as this is the number one way to prevent the spread of infection!  Home Care:  Only take medications as instructed by your medical team.  Complete the entire course of an antibiotic.  Do not take these medications with alcohol.  A steam or ultrasonic humidifier can help congestion.  You can place a towel over your  head and breathe in the steam from hot water coming from a faucet.  Avoid close contacts especially the very young and the elderly.  Cover your mouth when you cough or sneeze.  Always remember to wash your hands.  Get Help Right Away If:  You develop worsening fever or sinus pain.  You develop a severe head ache or visual changes.  Your symptoms persist after you have completed your treatment plan.  Make sure you  Understand these instructions.  Will watch your condition.  Will get help right away if you are not doing well or get worse.  Your e-visit answers were reviewed by a board certified advanced clinical practitioner to complete your personal care plan.  Depending on the condition, your plan could have included both over the counter or prescription medications.  If there is a problem please reply  once you have received a response from your provider.  Your safety is important to [place].  If you have drug allergies check your prescription carefully.    You can use MyChart to ask questions about today's visit, request a non-urgent call back, or ask for a work or school excuse for 24 hours related to this e-Visit. If it has been greater than 24 hours you will need to follow up with your provider, or enter a new e-Visit to address those concerns.  You will get an e-mail in the next two days asking about your experience.  I hope that your e-visit has been  valuable and will speed your recovery. Thank you for using e-visits.

## 2015-08-01 ENCOUNTER — Encounter: Payer: Self-pay | Admitting: Family

## 2015-08-16 MED FILL — ESZOPICLONE 3 MG TABLET: 3 | 30 days supply | Qty: 30 | Fill #2

## 2015-08-23 IMAGING — CT CT ANGIO CHEST
2 of 9 series · 19 of 46 positions shown · IV contrast (Omni 300)
Comparison: None.

CLINICAL DATA: Chest pain.

EXAM:
CT ANGIOGRAPHY CHEST WITH CONTRAST
TECHNIQUE: Multidetector CT imaging of the chest was performed using the
standard protocol during bolus administration of intravenous
contrast. Multiplanar CT image reconstructions and MIPs were
obtained to evaluate the vascular anatomy.
CONTRAST:  100mL OMNIPAQUE IOHEXOL 350 MG/ML SOLN

[Series 5: thins · axial · 0.63mm/px · z∈[+1114,+1326]mm · 16 of 240 slices shown]
[im 14/240  lung]
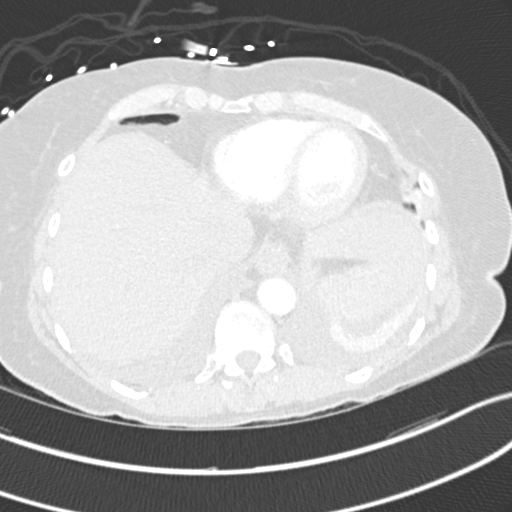
[im 27/240  soft-tissue]
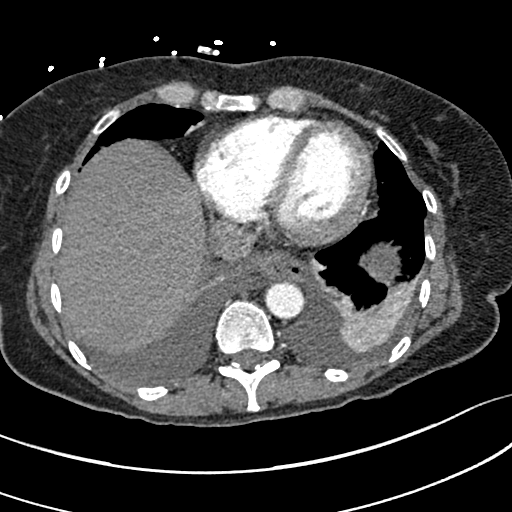
[im 40/240  lung]
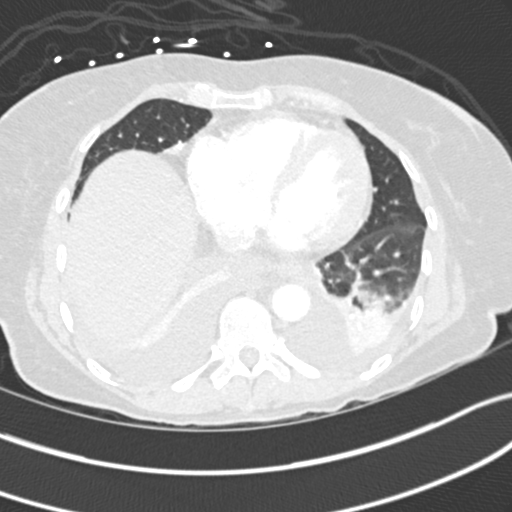
[im 54/240  soft-tissue]
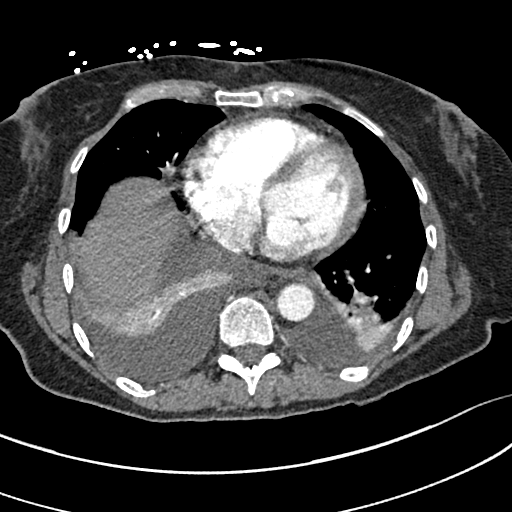
[im 67/240  lung]
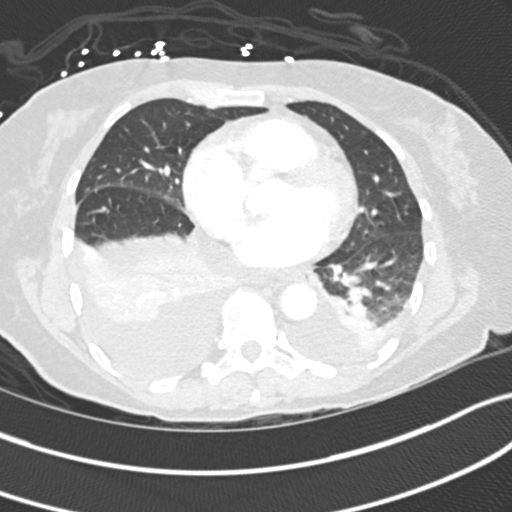
[im 80/240  soft-tissue]
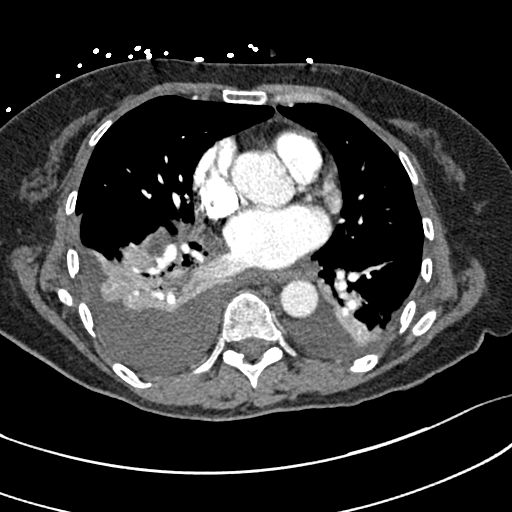
[im 93/240  lung]
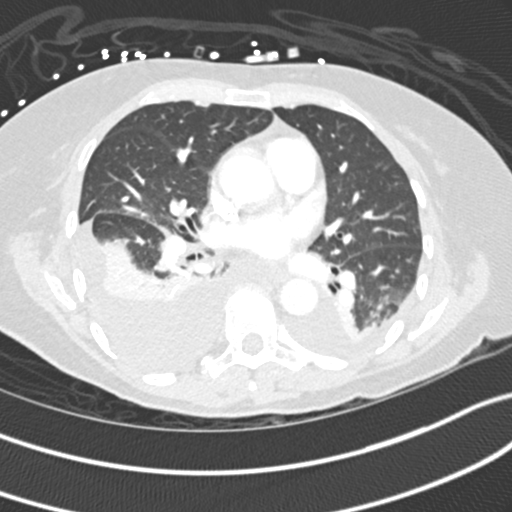
[im 107/240  soft-tissue]
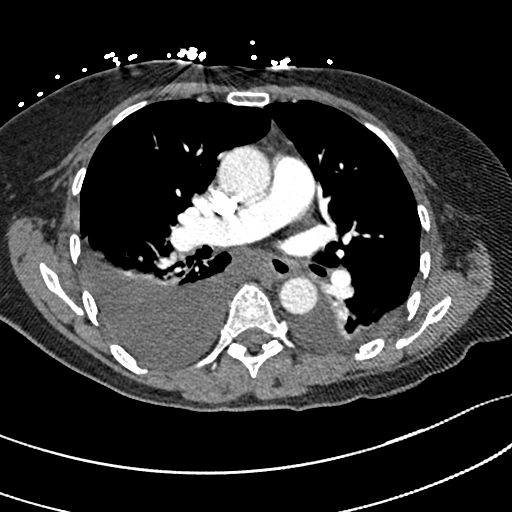
[im 133/240  lung]
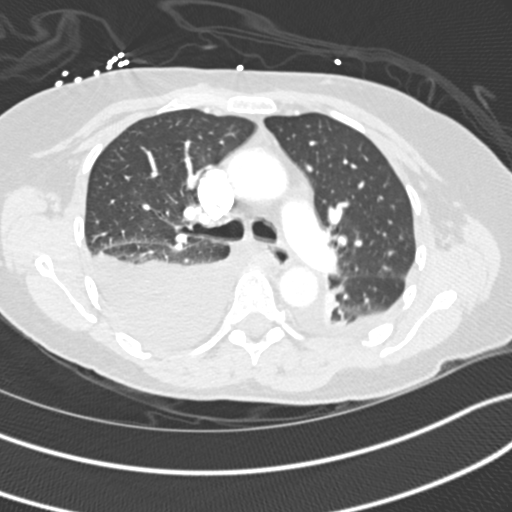
[im 147/240  soft-tissue]
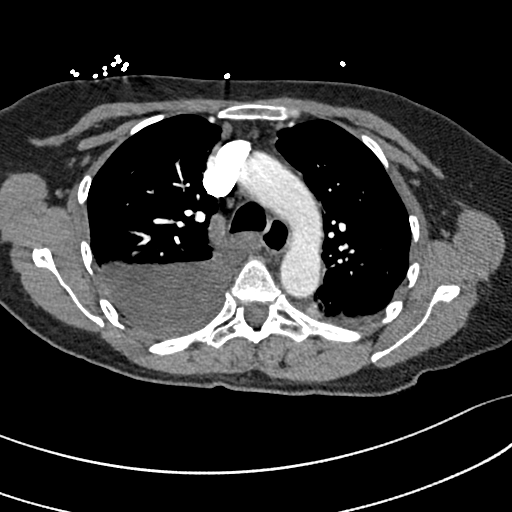
[im 160/240  lung]
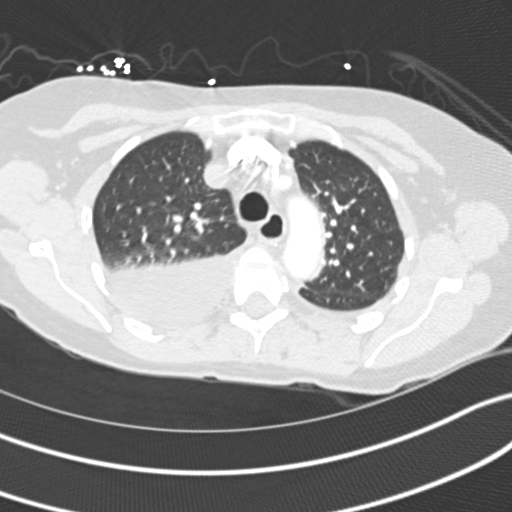
[im 173/240  soft-tissue]
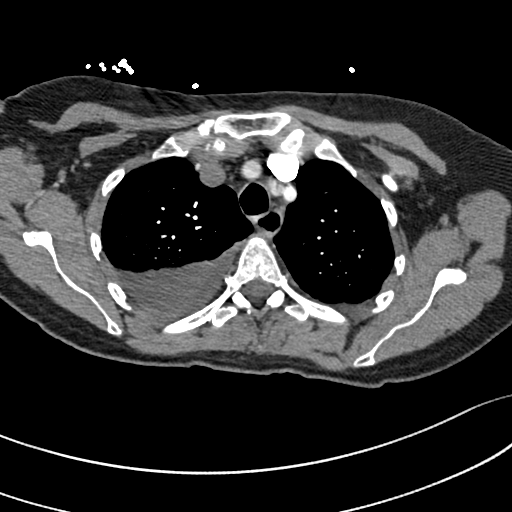
[im 186/240  lung]
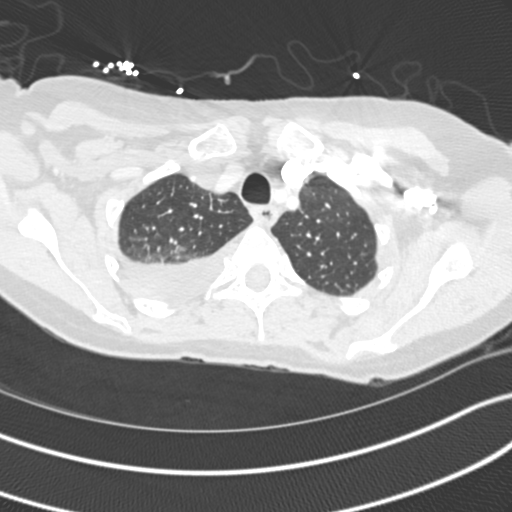
[im 200/240  soft-tissue]
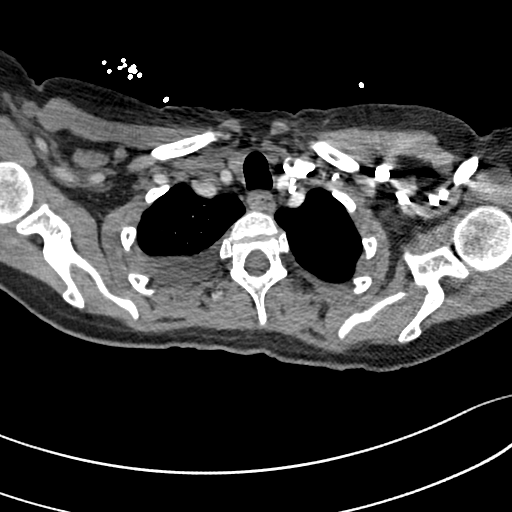
[im 213/240  lung]
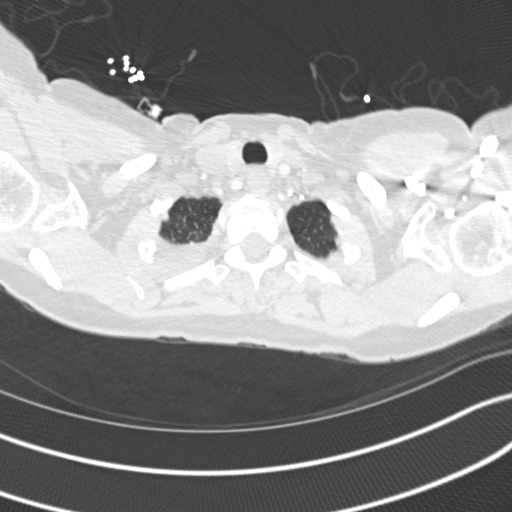
[im 226/240  soft-tissue]
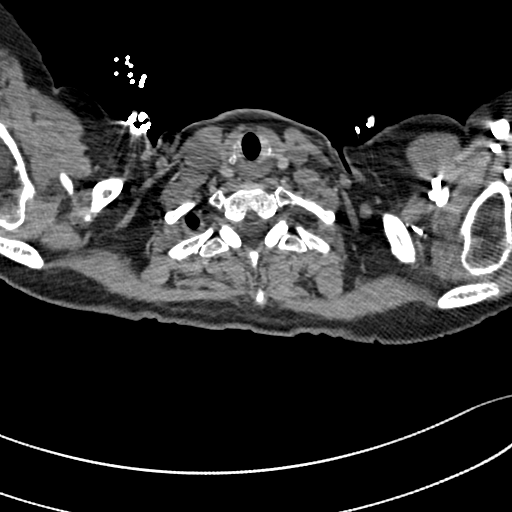

[Series 7: coronal mpr · coronal · 0.51mm/px · 3 of 143 slices shown]
[im 36/143  soft-tissue]
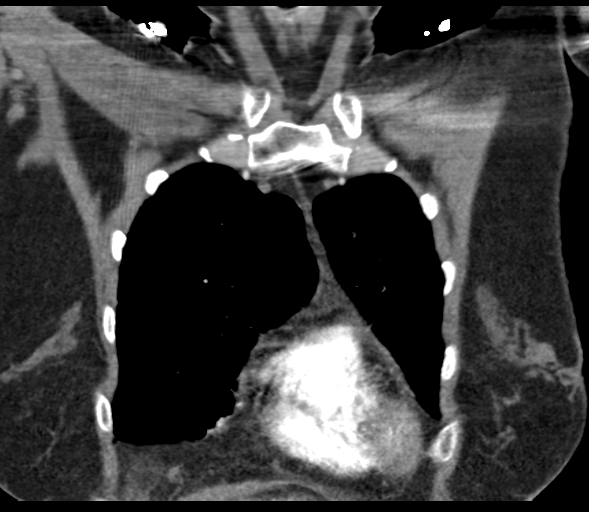
[im 72/143  soft-tissue]
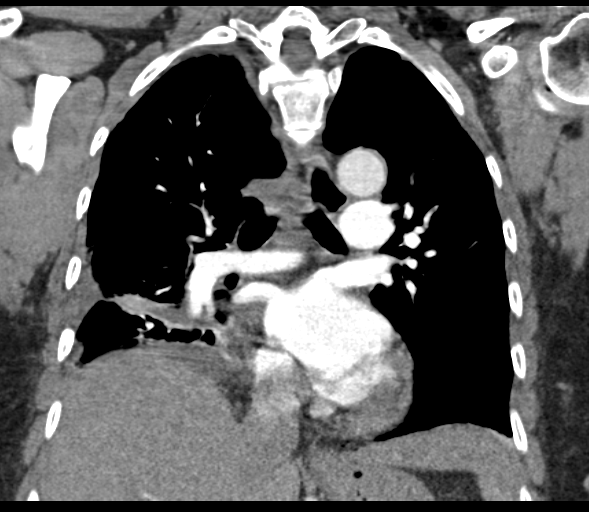
[im 107/143  soft-tissue]
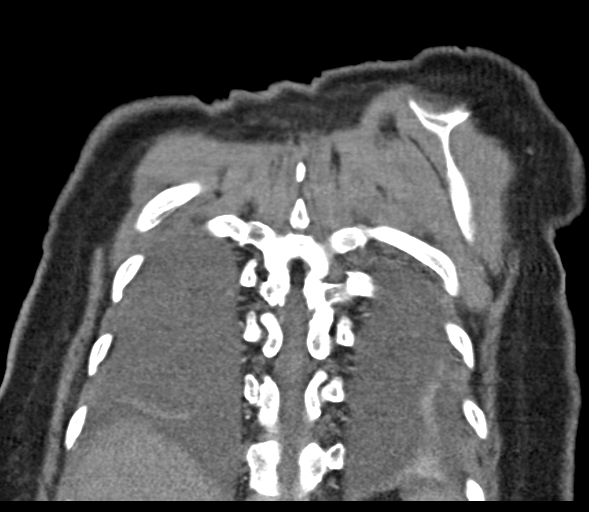

[19 of 46 positions shown; findings below may reference images not displayed]

FINDINGS: Mild left pleural effusion is noted with adjacent subsegmental
atelectasis. Moderate right pleural effusion is noted with adjacent
subsegmental atelectasis. No pneumothorax is noted. There is no
evidence of pulmonary embolus. Thoracic aorta appears normal. No
mediastinal mass or adenopathy is noted. Visualized portion of upper
abdomen appears normal. No definite osseous abnormality is noted.

Review of the MIP images confirms the above findings.
IMPRESSION: No evidence of pulmonary embolus. Bilateral pleural effusions are
noted with adjacent subsegmental atelectasis, with right much larger
than the left.

## 2015-08-25 IMAGING — CR DG CHEST 1V
2 series · 2 of 2 positions shown · non-contrast
Comparison: Chest CT- 05/06/2014

CLINICAL DATA: Post right-sided thoracentesis. History of diabetes.
Initial encounter.

EXAM:
CHEST - 1 VIEW

[w chest pa]
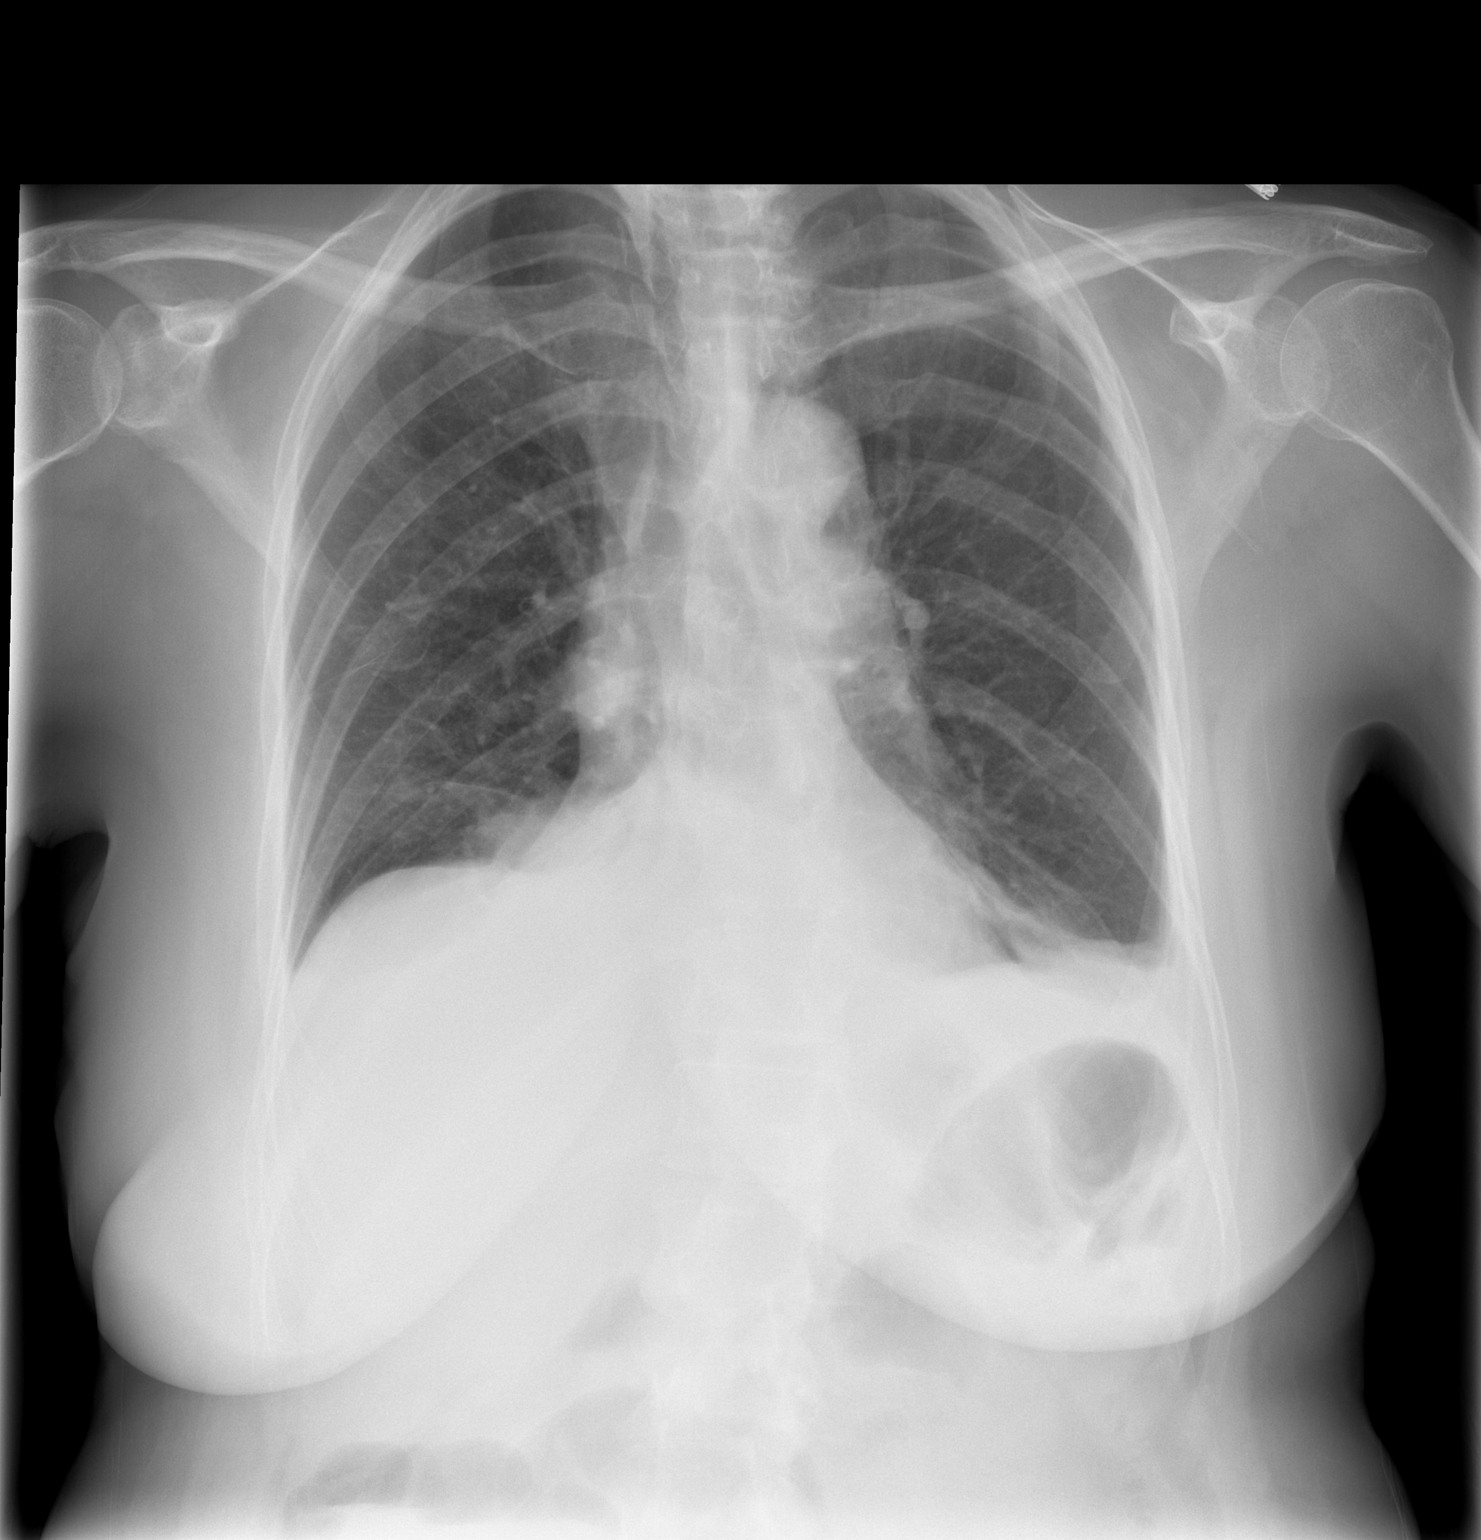

[w chest ap]
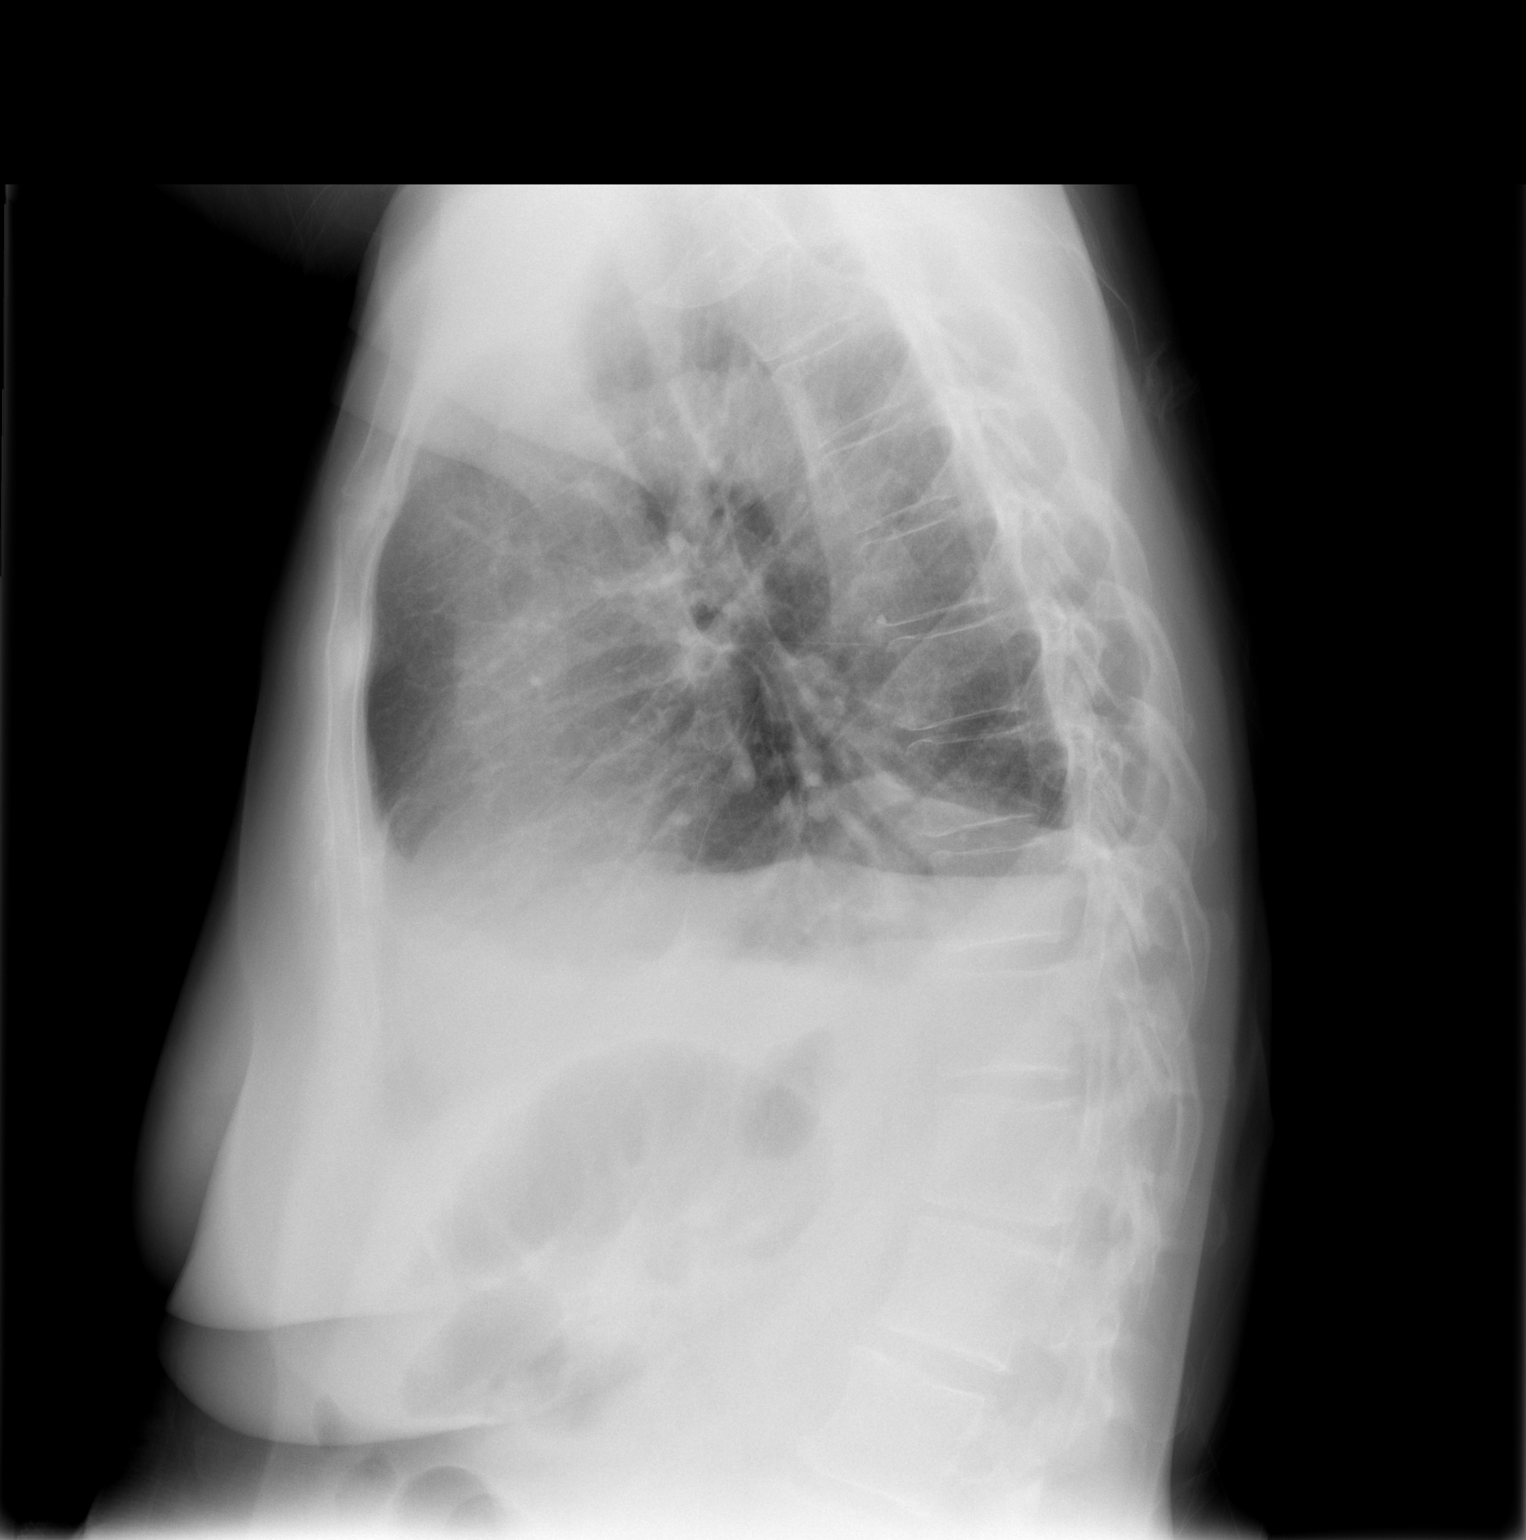

[2 of 2 positions shown; findings below may reference images not displayed]

FINDINGS: Grossly unchanged cardiac silhouette and mediastinal contours given
persistently reduced lung volumes. Interval resolution of
right-sided pleural effusion post thoracentesis. No pneumothorax.
Unchanged trace/small left-sided pleural effusion. Overall improved
aeration of the lung bases with persistent bibasilar opacities. No
new focal airspace opacities. No evidence of edema. No acute osseus
abnormalities.
IMPRESSION: 1. Interval resolution of right-sided pleural effusion post
thoracentesis. No pneumothorax.
2. Unchanged small left-sided pleural effusion.
3. Improved aeration of lung bases with persistent bibasilar
opacities, likely atelectasis.

## 2015-08-30 ENCOUNTER — Telehealth: Payer: Self-pay | Admitting: Gastroenterology

## 2015-08-30 NOTE — Telephone Encounter (Signed)
Pt complains of constant cramping, diarrhea and constipation off/on unrelated to eating.  Nausea has worsened over the past 3 weeks.  Had colostomy reversal 1 year ago.  Pt has been scheduled to see Cecille Rubin on Thursday.

## 2015-09-02 ENCOUNTER — Other Ambulatory Visit (INDEPENDENT_AMBULATORY_CARE_PROVIDER_SITE_OTHER): Payer: 59

## 2015-09-02 ENCOUNTER — Ambulatory Visit (INDEPENDENT_AMBULATORY_CARE_PROVIDER_SITE_OTHER): Payer: 59 | Admitting: Physician Assistant

## 2015-09-02 ENCOUNTER — Encounter: Payer: Self-pay | Admitting: Physician Assistant

## 2015-09-02 VITALS — BP 100/76 | HR 72 | Ht 66.5 in | Wt 160.4 lb

## 2015-09-02 DIAGNOSIS — E559 Vitamin D deficiency, unspecified: Secondary | ICD-10-CM | POA: Diagnosis not present

## 2015-09-02 DIAGNOSIS — R6881 Early satiety: Secondary | ICD-10-CM

## 2015-09-02 DIAGNOSIS — K219 Gastro-esophageal reflux disease without esophagitis: Secondary | ICD-10-CM | POA: Diagnosis not present

## 2015-09-02 DIAGNOSIS — K589 Irritable bowel syndrome without diarrhea: Secondary | ICD-10-CM | POA: Diagnosis not present

## 2015-09-02 DIAGNOSIS — R110 Nausea: Secondary | ICD-10-CM | POA: Diagnosis not present

## 2015-09-02 MED ORDER — RANITIDINE HCL 150 MG PO TABS: 300.0000 mg | ORAL_TABLET | Freq: Every day | ORAL | Status: DC

## 2015-09-02 MED ORDER — METOCLOPRAMIDE HCL 5 MG/5ML PO SOLN: 10.0000 mg | Freq: Three times a day (TID) | ORAL | Status: DC

## 2015-09-02 MED ORDER — ESOMEPRAZOLE MAGNESIUM 40 MG PO CPDR: 40.0000 mg | DELAYED_RELEASE_CAPSULE | Freq: Two times a day (BID) | ORAL | Status: DC

## 2015-09-02 MED FILL — raNITIdine HCL 150 MG TABS: 150 | 30 days supply | Qty: 60 | Fill #0

## 2015-09-02 MED FILL — ESOMEPRAZOLE MAG DR 40 MG C: 40 | 30 days supply | Qty: 60 | Fill #0

## 2015-09-02 MED FILL — METOCLOPRAMIDE 5 MG/5 ML SO: 5 | 3 days supply | Qty: 120 | Fill #0

## 2015-09-02 NOTE — Patient Instructions (Signed)
Your physician has requested that you go to the basement for the following lab work before leaving today:  We have sent the following medications to your pharmacy for you to pick up at your convenience: Nexium 40 mg twice a day Ranitidine 300 mg at bedtime Reglan syrup 10 mL take 10 ml before lunch and 10 mL before bedtime x 5 days.   Align one tablet daily.   Please keep your follow up with Dr. Ardis Hughs

## 2015-09-02 NOTE — Progress Notes (Signed)
Patient ID: SHAQUELL ROEHR, female   DOB: 01/30/1951, 64 y.o.   MRN: [id number]         History of Present Illness: [name]  Is a pleasant 65 year old female who is known to Dr. Ardis Hughs. She is employed as an Therapist, sports at [employer]. She reportedly had a colonoscopy in [place] in 2006 which was normal with no polyps. She had a surveillance colonoscopy by Dr. Thana Farr in [id number] which was complicated by a colonic perforation. Following the perforation she had a segmental resection, colostomy, and required a wound VAC while in the hospital. She was seen I Dr. Ardis Hughs in December 2015 to get a second opinion about having her colostomy taken down. She had a colonoscopy by Dr. Ardis Hughs on 08/18/2014 the anus the Hartman's pouch was evaluated and was completely normal-appearing. Via ostomy the remaining colon examined was normal. 2 polyps were found removed and sent to pathology. He was advised to have surveillance in 10 years. She had a EGD on 08/18/2014 as well. There was a typical-appearing reflux related short segment ulcerative esophagitis and a 3 cm hiatal hernia.    she subsequently underwent a reversal of her ostomy by Dr. Marcello Moores  In January 2016. She states she has never felt well since. She reports that she has at least one day a week where she wakes up in the middle of the night so nauseous that she vomits Zofran does provide relief for this. She describes a gnawing discomfort in the epigastric area with pain that occasionally radiates into the lower esophagus after meals. She complains that her stools have been alternating between days of formed stools and  Days of loose stools. She has been under a lot of stress in think she has gastritis. She feels that her epigastric pain is worse on an empty stomach , temporarily alleviated with ingestion of food and then gets worse. She states she gets full after 2 bites of food and then doesn't get hungry she feels as if she ate Thanksgiving dinner even  after half a cup of yogurt. Her weight has been stable. He has had no bright red blood per rectum or melena. She has been using Nexium twice daily but despite this gets some nocturnal heartburn.  Past Medical History  Diagnosis Date  . Depression   . GERD (gastroesophageal reflux disease)   . Hyperlipidemia   . Anxiety   . Vitamin D deficiency   . Suicide attempt ([place])   . Insomnia     periodically uses med.  . Perforated sigmoid colon ([place]) 04/28/14    during colonoscopy, "rupture colon"became septic, has colostomy and open surgical wound  . Complication of anesthesia     past hx. laryngospasm following 2 past surgeries, not recent    Past Surgical History  Procedure Laterality Date  . Abdominal hysterectomy  1999    TAH/BSO  . Elbow surgery  2007  . Ulnar tunnel release Left 2010  . Blepharoplasty  2010  . Cosmetic surgery    . Debridement tennis elbow    . Breast surgery  2001    Breast reduction  . Dilation and curettage of uterus      x3, hysteroscopy  . Laparotomy N/A 04/28/2014    Procedure: EXPLORATORY LAPAROTOMY,SIGMOID COLECTOMY;  Surgeon: Rolm Bookbinder, MD;  Location: [place];  Service: General;  Laterality: N/A;  . Colostomy N/A 04/28/2014    Procedure: COLOSTOMY;  Surgeon: Rolm Bookbinder, MD;  Location: [place];  Service: General;  Laterality:  N/A;  . Application of wound vac  04/28/2014    Procedure: APPLICATION OF WOUND VAC;  Surgeon: Rolm Bookbinder, MD;  Location: [place];  Service: General;;  . Colostomy revision N/A 04/28/2014    Procedure: COLON RESECTION SIGMOID;  Surgeon: Rolm Bookbinder, MD;  Location: [facility];  Service: General;  Laterality: N/A;  . Eye surgery      Blepharoplasty  . Colostomy takedown N/A 09/02/2014    Procedure: LAPAROSCOPIC COLOSTOMY REVERSAL;  Surgeon: [name], MD;  Location: [facility];  Service: General;  Laterality: N/A;  . Scar and adhesion repair  06/01/15    and liposuction   Family History  Problem Relation Age of Onset   . Hyperlipidemia Father   . Hypertension Father   . Diabetes Father   . Cancer Father     History of bladder cancer  . Hypertension Mother   . Diabetes Mother   . Heart disease Maternal Grandmother   . Stroke Maternal Grandfather   . Hyperlipidemia Brother   . Cancer Paternal Grandfather     pancreatic cancer  . Cancer Paternal Grandmother     uterine  . Heart disease Father   . Esophageal cancer Sister   . Other Father     4 ft intestinal removed-had gangrene in gallbladder, then spread to intestines   Social History  Substance Use Topics  . Smoking status: Light Tobacco Smoker    Types: Cigarettes    Last Attempt to Quit: 09/01/2003  . Smokeless tobacco: Never Used     Comment: occasional  . Alcohol Use: 2.4 oz/week    4 Standard drinks or equivalent per week   Current Outpatient Prescriptions  Medication Sig Dispense Refill  . esomeprazole (NEXIUM) 40 MG capsule Take 1 capsule (40 mg total) by mouth 2 (two) times daily before a meal. 60 capsule 6  . Eszopiclone 3 MG TABS Take 1 tablet (3 mg total) by mouth at bedtime. Take immediately before bedtime 30 tablet 5  . ibuprofen (ADVIL,MOTRIN) 200 MG tablet Take 800 mg by mouth every 6 (six) hours as needed for mild pain or moderate pain.    Marland Kitchen metoCLOPramide (REGLAN) 5 MG/5ML solution Take 10 mLs (10 mg total) by mouth 4 (four) times daily -  before meals and at bedtime. 120 mL 1  . ranitidine (ZANTAC) 150 MG tablet Take 2 tablets (300 mg total) by mouth at bedtime. 60 tablet 3  . valACYclovir (VALTREX) 500 MG tablet As needed  10   No current facility-administered medications for this visit.   Allergies  Allergen Reactions  . Bee Venom     Yellow jackets  . Statins Other (See Comments)    Muscle pains     Review of Systems:  per history of present illness otherwise negative.   Physical Exam: BP 100/76 mmHg  Pulse 72  Ht 5' 6.5" (1.689 m)  Wt 160 lb 6.4 oz (72.757 kg)  BMI 25.50 kg/m2  LMP  08/07/1998 General: Pleasant, well developed , female in no acute distress Head: Normocephalic and atraumatic Eyes:  sclerae anicteric, conjunctiva pink  Ears: Normal auditory acuity Lungs: Clear throughout to auscultation Heart: Regular rate and rhythm Abdomen: Soft, non distended, non-tender. No masses, no hepatomegaly. Normal bowel sounds Musculoskeletal: Symmetrical with no gross deformities  Extremities: No edema  Neurological: Alert oriented x 4, grossly nonfocal Psychological:  Alert and cooperative. Normal mood and affect  Assessment and Recommendations:  65 year old female with a history of GERD and esophagitis , status post  take down of colostomy , presenting with erratic bowel movements , nausea, and early satiety. An antireflux regimen has been reviewed at length. She has been encouraged to adhere to small volume, low fat, frequent meals. We have discussed repeat endoscopy in that it may be low yield and she is understanding of that. We have discussed obtaining a gastric emptying scan, but she states her co-pays are high and she prefers not to have any imaging at this time. She will continue Nexium 40 mg by mouth twice daily, and will add ranitidine 300 mg by mouth at at bedtime. Due to her complaints of early satiety she will be given a short-term trial of Reglan syrup 5 mg per 5 ML's, take 10 MLS before lunch and 10 MLS before bedtime for 5 days. She has been advised of the possible side effects of Reglan and was told if she has any problems tolerating the medication, she  Should discontinue the medication and contact us immediately. She will also try align one capsule daily. A CBC, comprehensive metabolic panel, and lipase will be obtained. Her erratic bowel movements are likely due to a component of irritable bowel exacerbated by her stress. Again she's been advised to limit fat in her diet and try to add fiber in her diet. She will follow up in 4-6 weeks, sooner if  needed.        Boyde Grieco, Deloris Ping 09/02/2015,

## 2015-09-03 ENCOUNTER — Other Ambulatory Visit (INDEPENDENT_AMBULATORY_CARE_PROVIDER_SITE_OTHER): Payer: 59

## 2015-09-03 ENCOUNTER — Other Ambulatory Visit: Payer: 59

## 2015-09-03 DIAGNOSIS — K589 Irritable bowel syndrome without diarrhea: Secondary | ICD-10-CM | POA: Diagnosis not present

## 2015-09-03 DIAGNOSIS — R110 Nausea: Secondary | ICD-10-CM

## 2015-09-03 DIAGNOSIS — R6881 Early satiety: Secondary | ICD-10-CM

## 2015-09-03 LAB — CBC WITH DIFFERENTIAL/PLATELET
Basophils Absolute: 0 10*3/uL (ref 0.0–0.1)
Basophils Relative: 0.6 % (ref 0.0–3.0)
Eosinophils Absolute: 0.2 10*3/uL (ref 0.0–0.7)
Eosinophils Relative: 2 % (ref 0.0–5.0)
HCT: 36.8 % (ref 36.0–46.0)
Hemoglobin: 12.1 g/dL (ref 12.0–15.0)
Lymphocytes Relative: 29.6 % (ref 12.0–46.0)
Lymphs Abs: 2.2 10*3/uL (ref 0.7–4.0)
MCHC: 33 g/dL (ref 30.0–36.0)
MCV: 89.3 fl (ref 78.0–100.0)
Monocytes Absolute: 0.4 10*3/uL (ref 0.1–1.0)
Monocytes Relative: 5.8 % (ref 3.0–12.0)
Neutro Abs: 4.7 10*3/uL (ref 1.4–7.7)
Neutrophils Relative %: 62 % (ref 43.0–77.0)
Platelets: 295 10*3/uL (ref 150.0–400.0)
RBC: 4.11 Mil/uL (ref 3.87–5.11)
RDW: 13.7 % (ref 11.5–15.5)
WBC: 7.5 10*3/uL (ref 4.0–10.5)

## 2015-09-03 LAB — COMPREHENSIVE METABOLIC PANEL
ALT: 11 U/L (ref 0–35)
AST: 18 U/L (ref 0–37)
Albumin: 4.5 g/dL (ref 3.5–5.2)
Alkaline Phosphatase: 47 U/L (ref 39–117)
BUN: 13 mg/dL (ref 6–23)
CO2: 28 mEq/L (ref 19–32)
Calcium: 9.5 mg/dL (ref 8.4–10.5)
Chloride: 101 mEq/L (ref 96–112)
Creatinine, Ser: 0.9 mg/dL (ref 0.40–1.20)
GFR: 67.18 mL/min (ref >60.00)
Glucose, Bld: 115 mg/dL — ABNORMAL HIGH (ref 70–99)
Potassium: 4 mEq/L (ref 3.5–5.1)
Sodium: 136 mEq/L (ref 135–145)
Total Bilirubin: 0.4 mg/dL (ref 0.2–1.2)
Total Protein: 7 g/dL (ref 6.0–8.3)

## 2015-09-03 LAB — LIPASE: Lipase: 28 U/L (ref 11.0–59.0)

## 2015-09-03 LAB — VITAMIN D 25 HYDROXY (VIT D DEFICIENCY, FRACTURES): Vit D, 25-Hydroxy: 41 ng/mL (ref 30–100)

## 2015-09-05 NOTE — Progress Notes (Signed)
i agree with the above note, plan 

## 2015-09-06 ENCOUNTER — Encounter: Payer: Self-pay | Admitting: Obstetrics & Gynecology

## 2015-09-07 MED ORDER — VITAMIN D (ERGOCALCIFEROL) 1.25 MG (50000 UNIT) PO CAPS: 50000.0000 [IU] | ORAL_CAPSULE | ORAL | Status: DC

## 2015-09-07 NOTE — Telephone Encounter (Signed)
Pt sent message through MyChart that she wanted to stay on prescription Vit D.  Rx for 50K every two weeks sent to [facility].  Pt notified via [place].

## 2015-09-13 MED FILL — VIT D2 1.25 MG (50,000 UNIT: 1.25 MG | 84 days supply | Qty: 6 | Fill #0

## 2015-09-13 MED FILL — ESZOPICLONE 3 MG TABLET: 3 | 30 days supply | Qty: 30 | Fill #3

## 2015-10-18 MED FILL — ESZOPICLONE 3 MG TABLET: 3 | 30 days supply | Qty: 30 | Fill #4

## 2015-10-18 MED FILL — ESOMEPRAZOLE MAG DR 40 MG C: 40 | 30 days supply | Qty: 60 | Fill #1

## 2015-10-25 MED FILL — raNITIdine HCL 150 MG TABS: 150 | 30 days supply | Qty: 60 | Fill #1

## 2015-11-05 ENCOUNTER — Encounter: Payer: Self-pay | Admitting: Gastroenterology

## 2015-11-05 ENCOUNTER — Ambulatory Visit (INDEPENDENT_AMBULATORY_CARE_PROVIDER_SITE_OTHER): Payer: 59 | Admitting: Gastroenterology

## 2015-11-05 VITALS — BP 98/68 | HR 96 | Ht 66.25 in | Wt 159.1 lb

## 2015-11-05 DIAGNOSIS — R110 Nausea: Secondary | ICD-10-CM

## 2015-11-05 MED ORDER — ONDANSETRON HCL 4 MG PO TABS: 4.0000 mg | ORAL_TABLET | Freq: Every day | ORAL | Status: DC

## 2015-11-05 MED FILL — ONDANSETRON HCL 4 MG TABLET: 4 | 30 days supply | Qty: 30 | Fill #0

## 2015-11-05 NOTE — Patient Instructions (Addendum)
You should change the way you are taking your antiacid medicine (nexium twice daily) so that you are taking it 20-30 minutes prior to a decent meal as that is the way the pill is designed to work most effectively. Zofran 4mg  pill, take one pill every morning. Disp 30, refill 6. Miralax every day, one dose. Zantac, one pill at bedtime nightly. Please return to see Dr. Ardis Hughs in 3 months.

## 2015-11-05 NOTE — Progress Notes (Signed)
Review of pertinent gastrointestinal problems: 1. Routine risk for colon cancer: colonoscopy in [place] in 2006 which was normal with no polyps. She had a surveillance colonoscopy by Dr. Thana Farr in [id number] which was complicated by a colonic perforation. colonoscopy by Dr. Ardis Hughs on 08/18/2014 the anus the Hartman's pouch was evaluated and was completely normal-appearing. Via ostomy the remaining colon examined was normal. 2 polyps were found removed and sent to pathology (hyperplastic); recommended repeat screening exam 08/2024. 2. Colon perforated by Dr. Earlean Shawl during colonoscopy 2015: Following the perforation she had a segmental resection, colostomy, and required a wound VAC while in the hospital. colostomy takedown Dr. [name] [id number] 3. Reflux esophagitis  EGD DR. Ardis Hughs on 08/18/2014. There was a typical-appearing reflux related short segment ulcerative esophagitis and a 3 cm hiatal hernia. BID PPI recommended.  HPI: This is a   very pleasant 65 year old woman whom I last saw 2 or 3 months ago  Chief complaint is constipation, nausea, early satiety  She has been on nexium bid (AM and bedtime) and 2 zantac QHS.  She tried reglan for about 5 days, this did seem to help.  Recommended eating prunes.  She has had constipation for a long time. Always feels full.  Constipation is really most notable since colostomy.  Recommended miralax daily, she did this for 3 weeks and that helped.  Feels early satiety; eating less than usual.  Her weight has been overall stable.  Nausea has been biggest issue.  zofran helps.   Past Medical History  Diagnosis Date  . Depression   . GERD (gastroesophageal reflux disease)   . Hyperlipidemia   . Anxiety   . Vitamin D deficiency   . Suicide attempt ([place])   . Insomnia     periodically uses med.  . Perforated sigmoid colon ([place]) 04/28/14    during colonoscopy, "rupture colon"became septic, has colostomy and open surgical wound  . Complication of  anesthesia     past hx. laryngospasm following 2 past surgeries, not recent    Past Surgical History  Procedure Laterality Date  . Abdominal hysterectomy  1999    TAH/BSO  . Elbow surgery  2007  . Ulnar tunnel release Left 2010  . Blepharoplasty  2010  . Cosmetic surgery    . Debridement tennis elbow    . Breast surgery  2001    Breast reduction  . Dilation and curettage of uterus      x3, hysteroscopy  . Laparotomy N/A 04/28/2014    Procedure: EXPLORATORY LAPAROTOMY,SIGMOID COLECTOMY;  Surgeon: Rolm Bookbinder, MD;  Location: [facility];  Service: General;  Laterality: N/A;  . Colostomy N/A 04/28/2014    Procedure: COLOSTOMY;  Surgeon: Rolm Bookbinder, MD;  Location: [place];  Service: General;  Laterality: N/A;  . Application of wound vac  04/28/2014    Procedure: APPLICATION OF WOUND VAC;  Surgeon: Rolm Bookbinder, MD;  Location: [place];  Service: General;;  . Colostomy revision N/A 04/28/2014    Procedure: COLON RESECTION SIGMOID;  Surgeon: Rolm Bookbinder, MD;  Location: [place];  Service: General;  Laterality: N/A;  . Eye surgery      Blepharoplasty  . Colostomy takedown N/A 09/02/2014    Procedure: LAPAROSCOPIC COLOSTOMY REVERSAL;  Surgeon: [name], MD;  [facility];  Service: General;  Laterality: N/A;  . Scar and adhesion repair  06/01/15    and liposuction    Current Outpatient Prescriptions  Medication Sig Dispense Refill  . esomeprazole (NEXIUM) 40 MG capsule Take 1  capsule (40 mg total) by mouth 2 (two) times daily before a meal. 60 capsule 6  . Eszopiclone 3 MG TABS Take 1 tablet (3 mg total) by mouth at bedtime. Take immediately before bedtime 30 tablet 5  . ibuprofen (ADVIL,MOTRIN) 200 MG tablet Take 800 mg by mouth every 6 (six) hours as needed for mild pain or moderate pain.    . ranitidine (ZANTAC) 150 MG tablet Take 2 tablets (300 mg total) by mouth at bedtime. 60 tablet 3  . valACYclovir (VALTREX) 500 MG tablet As needed  10  . Vitamin D,  Ergocalciferol, (DRISDOL) 50000 units CAPS capsule Take 1 capsule (50,000 Units total) by mouth every 14 (fourteen) days. 6 capsule 4   No current facility-administered medications for this visit.    Allergies as of 11/05/2015 - Review Complete 11/05/2015  Allergen Reaction Noted  . Bee venom  05/06/2013  . Statins Other (See Comments) 10/18/2012    Family History  Problem Relation Age of Onset  . Hyperlipidemia Father   . Hypertension Father   . Diabetes Father   . Cancer Father     History of bladder cancer  . Hypertension Mother   . Diabetes Mother   . Heart disease Maternal Grandmother   . Stroke Maternal Grandfather   . Hyperlipidemia Brother   . Cancer Paternal Grandfather     pancreatic cancer  . Cancer Paternal Grandmother     uterine  . Heart disease Father   . Esophageal cancer Sister   . Other Father     4 ft intestinal removed-had gangrene in gallbladder, then spread to intestines    Social History   Social History  . Marital Status: Single    Spouse Name: N/A  . Number of Children: N/A  . Years of Education: N/A   Occupational History  . Not on file.   Social History Main Topics  . Smoking status: Light Tobacco Smoker    Types: Cigarettes    Last Attempt to Quit: 09/01/2003  . Smokeless tobacco: Never Used     Comment: occasional  . Alcohol Use: 2.4 oz/week    4 Standard drinks or equivalent per week  . Drug Use: No  . Sexual Activity: No     Comment: TAH/BSO   Other Topics Concern  . Not on file   Social History Narrative   College Degree   Registered Nurse   Exercises           Physical Exam: BP 98/68 mmHg  Pulse 96  Ht 5' 6.25" (1.683 m)  Wt 159 lb 2 oz (72.179 kg)  BMI 25.48 kg/m2  LMP 08/07/1998 Constitutional: generally well-appearing Psychiatric: alert and oriented x3 Abdomen: soft, nontender, nondistended, no obvious ascites, no peritoneal signs, normal bowel sounds   Assessment and plan: 65 y.o. female with  Constipation, nausea, early satiety  MiraLAX on a daily basis seems to help her mild constipation and I recommended that she get back on that. She is not taking her proton pump inhibitor at the correct time and relation to meals and she will change her evening dose so that she takes it shortly before dinner meal. To continue H2 blocker one pill at bedtime every night. I'm giving her a prescription for Zofran, 4 mg pills. These seem to help her nausea and she will stay on it for now. I don't think she is any specific GI testing at this point. I really do think that if we can get her back to her  normal exercise routine, a bit more time from her colostomy takedown she will start feeling better and better. She will return to see me in 3 months and sooner if needed.   Owens Loffler, MD [facility] 11/05/2015, 11:03 AM

## 2015-11-16 MED FILL — ACYCLOVIR 200 MG CAPSULE: 200 | 2 days supply | Qty: 6 | Fill #0

## 2015-11-16 MED FILL — ALPRAZolam 0.5 MG TABS: 0.5 | 1 days supply | Qty: 2 | Fill #0

## 2015-11-16 MED FILL — ESZOPICLONE 3 MG TABLET: 3 | 30 days supply | Qty: 30 | Fill #5

## 2015-12-07 MED FILL — raNITIdine HCL 150 MG TABS: 150 | 30 days supply | Qty: 60 | Fill #2

## 2015-12-07 MED FILL — VIT D2 1.25 MG (50,000 UNIT: 1.25 MG | 84 days supply | Qty: 6 | Fill #1

## 2015-12-07 MED FILL — ESOMEPRAZOLE MAG DR 40 MG C: 40 | 30 days supply | Qty: 60 | Fill #2

## 2015-12-08 ENCOUNTER — Encounter: Payer: Self-pay | Admitting: Gastroenterology

## 2015-12-14 DIAGNOSIS — H5203 Hypermetropia, bilateral: Secondary | ICD-10-CM | POA: Diagnosis not present

## 2015-12-21 ENCOUNTER — Other Ambulatory Visit: Payer: Self-pay | Admitting: Obstetrics & Gynecology

## 2015-12-21 MED FILL — ESZOPICLONE 3 MG TABLET: 3 | 30 days supply | Qty: 30 | Fill #0

## 2015-12-21 NOTE — Telephone Encounter (Signed)
RX faxed

## 2015-12-21 NOTE — Telephone Encounter (Signed)
Medication refill request: Eszopiclone 3 mg  Last AEX:  06/14/2015 with SM  Next AEX: 09/29/2016 with SM Last MMG (if hormonal medication request): n/a Refill authorized: #30/5 rfs, please advise

## 2015-12-31 DIAGNOSIS — D2371 Other benign neoplasm of skin of right lower limb, including hip: Secondary | ICD-10-CM | POA: Diagnosis not present

## 2015-12-31 DIAGNOSIS — D2261 Melanocytic nevi of right upper limb, including shoulder: Secondary | ICD-10-CM | POA: Diagnosis not present

## 2015-12-31 DIAGNOSIS — D1801 Hemangioma of skin and subcutaneous tissue: Secondary | ICD-10-CM | POA: Diagnosis not present

## 2015-12-31 DIAGNOSIS — L821 Other seborrheic keratosis: Secondary | ICD-10-CM | POA: Diagnosis not present

## 2015-12-31 DIAGNOSIS — B078 Other viral warts: Secondary | ICD-10-CM | POA: Diagnosis not present

## 2015-12-31 DIAGNOSIS — D225 Melanocytic nevi of trunk: Secondary | ICD-10-CM | POA: Diagnosis not present

## 2015-12-31 DIAGNOSIS — L720 Epidermal cyst: Secondary | ICD-10-CM | POA: Diagnosis not present

## 2015-12-31 DIAGNOSIS — D2262 Melanocytic nevi of left upper limb, including shoulder: Secondary | ICD-10-CM | POA: Diagnosis not present

## 2016-01-04 MED FILL — ONDANSETRON HCL 4 MG TABLET: 4 | 30 days supply | Qty: 30 | Fill #1

## 2016-01-20 DIAGNOSIS — F332 Major depressive disorder, recurrent severe without psychotic features: Secondary | ICD-10-CM | POA: Diagnosis not present

## 2016-01-20 MED FILL — CITALOPRAM HBR 20 MG TABLET: 20 | 30 days supply | Qty: 30 | Fill #0

## 2016-01-20 MED FILL — BUPROPION HCL XL 150 MG TAB: 150 | 30 days supply | Qty: 30 | Fill #0

## 2016-01-26 MED FILL — ESZOPICLONE 3 MG TABLET: 3 | 30 days supply | Qty: 30 | Fill #1

## 2016-01-26 MED FILL — ESOMEPRAZOLE MAG DR 40 MG C: 40 | 30 days supply | Qty: 60 | Fill #3

## 2016-01-26 MED FILL — raNITIdine HCL 150 MG TABS: 150 | 30 days supply | Qty: 60 | Fill #3

## 2016-02-17 MED FILL — BUPROPION HCL XL 150 MG TAB: 150 | 30 days supply | Qty: 30 | Fill #1

## 2016-02-17 MED FILL — CITALOPRAM HBR 20 MG TABLET: 20 | 30 days supply | Qty: 30 | Fill #1

## 2016-02-19 DIAGNOSIS — R8290 Unspecified abnormal findings in urine: Secondary | ICD-10-CM | POA: Diagnosis not present

## 2016-02-19 DIAGNOSIS — R300 Dysuria: Secondary | ICD-10-CM | POA: Diagnosis not present

## 2016-02-24 DIAGNOSIS — F332 Major depressive disorder, recurrent severe without psychotic features: Secondary | ICD-10-CM | POA: Diagnosis not present

## 2016-03-02 MED FILL — ESZOPICLONE 3 MG TABLET: 3 | 30 days supply | Qty: 30 | Fill #2

## 2016-03-07 DIAGNOSIS — F332 Major depressive disorder, recurrent severe without psychotic features: Secondary | ICD-10-CM | POA: Diagnosis not present

## 2016-03-14 MED FILL — BUPROPION HCL XL 150 MG TAB: 150 | 30 days supply | Qty: 60 | Fill #0

## 2016-03-14 MED FILL — CITALOPRAM HBR 20 MG TABLET: 20 | 30 days supply | Qty: 30 | Fill #2

## 2016-03-27 DIAGNOSIS — F332 Major depressive disorder, recurrent severe without psychotic features: Secondary | ICD-10-CM | POA: Diagnosis not present

## 2016-03-30 MED FILL — VIT D2 1.25 MG (50,000 UNIT: 1.25 MG | 84 days supply | Qty: 6 | Fill #2

## 2016-03-30 MED FILL — ESZOPICLONE 3 MG TABLET: 3 | 30 days supply | Qty: 30 | Fill #3

## 2016-04-05 ENCOUNTER — Other Ambulatory Visit: Payer: Self-pay

## 2016-04-05 MED ORDER — RANITIDINE HCL 150 MG PO TABS: 300.0000 mg | ORAL_TABLET | Freq: Every day | ORAL | 3 refills | Status: DC

## 2016-04-16 MED FILL — BUPROPION HCL XL 150 MG TAB: 150 | 30 days supply | Qty: 60 | Fill #1

## 2016-04-17 DIAGNOSIS — F332 Major depressive disorder, recurrent severe without psychotic features: Secondary | ICD-10-CM | POA: Diagnosis not present

## 2016-04-17 MED FILL — raNITIdine HCL 150 MG TABS: 150 | 30 days supply | Qty: 60 | Fill #0

## 2016-04-24 MED FILL — CITALOPRAM HBR 20 MG TABLET: 20 | 30 days supply | Qty: 30 | Fill #0

## 2016-04-25 DIAGNOSIS — F332 Major depressive disorder, recurrent severe without psychotic features: Secondary | ICD-10-CM | POA: Diagnosis not present

## 2016-04-27 ENCOUNTER — Other Ambulatory Visit: Payer: Self-pay | Admitting: Obstetrics & Gynecology

## 2016-04-27 DIAGNOSIS — Z1231 Encounter for screening mammogram for malignant neoplasm of breast: Secondary | ICD-10-CM

## 2016-04-27 DIAGNOSIS — Z9889 Other specified postprocedural states: Secondary | ICD-10-CM

## 2016-05-07 MED FILL — ESOMEPRAZOLE MAG DR 40 MG C: 40 | 30 days supply | Qty: 60 | Fill #4

## 2016-05-08 ENCOUNTER — Ambulatory Visit
Admission: RE | Admit: 2016-05-08 | Discharge: 2016-05-08 | Disposition: A | Discharge status: Home / Self Care | Payer: 59 | Source: Ambulatory Visit | Attending: Obstetrics & Gynecology | Admitting: Obstetrics & Gynecology

## 2016-05-08 DIAGNOSIS — Z1231 Encounter for screening mammogram for malignant neoplasm of breast: Secondary | ICD-10-CM

## 2016-05-08 DIAGNOSIS — F332 Major depressive disorder, recurrent severe without psychotic features: Secondary | ICD-10-CM | POA: Diagnosis not present

## 2016-05-08 DIAGNOSIS — Z9889 Other specified postprocedural states: Secondary | ICD-10-CM

## 2016-05-08 MED FILL — LUNESTA 3 MG TABLET: 3 | 30 days supply | Qty: 30 | Fill #4

## 2016-05-11 DIAGNOSIS — F332 Major depressive disorder, recurrent severe without psychotic features: Secondary | ICD-10-CM | POA: Diagnosis not present

## 2016-05-11 MED FILL — raNITIdine HCL 150 MG TABS: 150 | 30 days supply | Qty: 60 | Fill #1

## 2016-05-11 MED FILL — BUPROPION HCL XL 150 MG TAB: 150 | 30 days supply | Qty: 60 | Fill #2

## 2016-05-15 DIAGNOSIS — F332 Major depressive disorder, recurrent severe without psychotic features: Secondary | ICD-10-CM | POA: Diagnosis not present

## 2016-05-22 DIAGNOSIS — F332 Major depressive disorder, recurrent severe without psychotic features: Secondary | ICD-10-CM | POA: Diagnosis not present

## 2016-05-22 MED FILL — CITALOPRAM HBR 20 MG TABLET: 20 | 30 days supply | Qty: 30 | Fill #1

## 2016-06-02 MED FILL — raNITIdine HCL 150 MG TABS: 150 | 30 days supply | Qty: 60 | Fill #2

## 2016-06-02 MED FILL — ESOMEPRAZOLE MAG DR 40 MG C: 40 | 30 days supply | Qty: 60 | Fill #5

## 2016-06-02 MED FILL — ONDANSETRON HCL 4 MG TABLET: 4 | 30 days supply | Qty: 30 | Fill #2

## 2016-06-02 MED FILL — BUPROPION HCL XL 150 MG TAB: 150 | 30 days supply | Qty: 60 | Fill #0

## 2016-06-07 DIAGNOSIS — F332 Major depressive disorder, recurrent severe without psychotic features: Secondary | ICD-10-CM | POA: Diagnosis not present

## 2016-06-14 MED FILL — ESZOPICLONE 3 MG TABLET: 3 | 30 days supply | Qty: 30 | Fill #5

## 2016-06-26 MED FILL — CITALOPRAM HBR 20 MG TABLET: 20 | 30 days supply | Qty: 30 | Fill #0

## 2016-07-05 DIAGNOSIS — F332 Major depressive disorder, recurrent severe without psychotic features: Secondary | ICD-10-CM | POA: Diagnosis not present

## 2016-07-17 DIAGNOSIS — F332 Major depressive disorder, recurrent severe without psychotic features: Secondary | ICD-10-CM | POA: Diagnosis not present

## 2016-07-17 MED FILL — VIT D2 1.25 MG (50,000 UNIT: 1.25 MG | 84 days supply | Qty: 6 | Fill #3

## 2016-07-19 ENCOUNTER — Other Ambulatory Visit: Payer: Self-pay

## 2016-07-19 NOTE — Telephone Encounter (Signed)
Medication refill request: Eszopiclone 3mg  Last AEX:  06/14/15 SM Next AEX: 09/29/16 Last MMG (if hormonal medication request): 05/11/16 BIRADS 1 negative Refill authorized: 12/21/15 #30 w/5 refills; today please advise

## 2016-07-20 MED ORDER — ESZOPICLONE 3 MG PO TABS: 3.0000 mg | ORAL_TABLET | Freq: Every day | ORAL | 5 refills | Status: DC

## 2016-07-20 MED FILL — ESZOPICLONE 3 MG TABLET: 3 | 30 days supply | Qty: 30 | Fill #0

## 2016-07-20 NOTE — Telephone Encounter (Signed)
Prescription for Eszopiclone 3mg  faxed to [facility]. Fax number: [phone].

## 2016-07-23 MED FILL — CITALOPRAM HBR 20 MG TABLET: 20 | 30 days supply | Qty: 30 | Fill #1

## 2016-08-02 MED FILL — BUPROPION HCL XL 150 MG TAB: 150 | 30 days supply | Qty: 60 | Fill #1

## 2016-08-10 DIAGNOSIS — F332 Major depressive disorder, recurrent severe without psychotic features: Secondary | ICD-10-CM | POA: Diagnosis not present

## 2016-08-28 MED FILL — raNITIdine HCL 150 MG TABS: 150 | 30 days supply | Qty: 60 | Fill #3

## 2016-08-28 MED FILL — ESZOPICLONE 3 MG TABLET: 3 | 30 days supply | Qty: 30 | Fill #1

## 2016-08-28 MED FILL — CITALOPRAM HBR 20 MG TABLET: 20 | 30 days supply | Qty: 30 | Fill #2

## 2016-09-06 ENCOUNTER — Other Ambulatory Visit: Payer: Self-pay

## 2016-09-06 MED ORDER — ESOMEPRAZOLE MAGNESIUM 40 MG PO CPDR: 40.0000 mg | DELAYED_RELEASE_CAPSULE | Freq: Two times a day (BID) | ORAL | 6 refills | Status: DC

## 2016-09-13 MED FILL — ESOMEPRAZOLE MAG DR 40 MG C: 40 | 30 days supply | Qty: 60 | Fill #0

## 2016-09-13 MED FILL — BUPROPION HCL XL 150 MG TAB: 150 | 30 days supply | Qty: 60 | Fill #2

## 2016-09-26 DIAGNOSIS — F332 Major depressive disorder, recurrent severe without psychotic features: Secondary | ICD-10-CM | POA: Diagnosis not present

## 2016-09-29 ENCOUNTER — Ambulatory Visit (INDEPENDENT_AMBULATORY_CARE_PROVIDER_SITE_OTHER): Payer: 59 | Admitting: Obstetrics & Gynecology

## 2016-09-29 ENCOUNTER — Other Ambulatory Visit: Payer: Self-pay | Admitting: Obstetrics & Gynecology

## 2016-09-29 ENCOUNTER — Encounter: Payer: Self-pay | Admitting: Obstetrics & Gynecology

## 2016-09-29 VITALS — BP 102/58 | HR 78 | Resp 14 | Ht 65.75 in | Wt 157.0 lb

## 2016-09-29 DIAGNOSIS — Z01419 Encounter for gynecological examination (general) (routine) without abnormal findings: Secondary | ICD-10-CM | POA: Diagnosis not present

## 2016-09-29 DIAGNOSIS — E2839 Other primary ovarian failure: Secondary | ICD-10-CM | POA: Diagnosis not present

## 2016-09-29 DIAGNOSIS — Z205 Contact with and (suspected) exposure to viral hepatitis: Secondary | ICD-10-CM | POA: Diagnosis not present

## 2016-09-29 DIAGNOSIS — D509 Iron deficiency anemia, unspecified: Secondary | ICD-10-CM | POA: Diagnosis not present

## 2016-09-29 DIAGNOSIS — Z0000 Encounter for general adult medical examination without abnormal findings: Secondary | ICD-10-CM | POA: Diagnosis not present

## 2016-09-29 DIAGNOSIS — Z1211 Encounter for screening for malignant neoplasm of colon: Secondary | ICD-10-CM | POA: Diagnosis not present

## 2016-09-29 LAB — COMPREHENSIVE METABOLIC PANEL
ALT: 11 U/L (ref 6–29)
AST: 18 U/L (ref 10–35)
Albumin: 4.5 g/dL (ref 3.6–5.1)
Alkaline Phosphatase: 43 U/L (ref 33–130)
BUN: 11 mg/dL (ref 7–25)
CO2: 24 mmol/L (ref 20–31)
Calcium: 9.7 mg/dL (ref 8.6–10.4)
Chloride: 101 mmol/L (ref 98–110)
Creat: 0.97 mg/dL (ref 0.50–0.99)
Glucose, Bld: 100 mg/dL — ABNORMAL HIGH (ref 65–99)
Potassium: 5.3 mmol/L (ref 3.5–5.3)
Sodium: 135 mmol/L (ref 135–146)
Total Bilirubin: 0.2 mg/dL (ref 0.2–1.2)
Total Protein: 6.9 g/dL (ref 6.1–8.1)

## 2016-09-29 LAB — CBC
HCT: 32.6 % — ABNORMAL LOW (ref 35.0–45.0)
Hemoglobin: 10 g/dL — ABNORMAL LOW (ref 11.7–15.5)
MCH: 24.4 pg — ABNORMAL LOW (ref 27.0–33.0)
MCHC: 30.7 g/dL — ABNORMAL LOW (ref 32.0–36.0)
MCV: 79.5 fL — ABNORMAL LOW (ref 80.0–100.0)
MPV: 9.6 fL (ref 7.5–12.5)
Platelets: 403 10*3/uL — ABNORMAL HIGH (ref 140–400)
RBC: 4.1 MIL/uL (ref 3.80–5.10)
RDW: 18.1 % — ABNORMAL HIGH (ref 11.0–15.0)
WBC: 5.7 10*3/uL (ref 3.8–10.8)

## 2016-09-29 LAB — LIPID PANEL
Cholesterol: 241 mg/dL — ABNORMAL HIGH (ref <200)
HDL: 69 mg/dL (ref >50)
LDL Cholesterol: 152 mg/dL — ABNORMAL HIGH (ref <100)
Total CHOL/HDL Ratio: 3.5 Ratio (ref <5.0)
Triglycerides: 98 mg/dL (ref <150)
VLDL: 20 mg/dL (ref <30)

## 2016-09-29 LAB — HEPATITIS C ANTIBODY: HCV Ab: NEGATIVE

## 2016-09-29 LAB — TSH: TSH: 1.82 mIU/L

## 2016-09-29 MED ORDER — VITAMIN D (ERGOCALCIFEROL) 1.25 MG (50000 UNIT) PO CAPS: 50000.0000 [IU] | ORAL_CAPSULE | ORAL | 4 refills | Status: DC

## 2016-09-29 MED ORDER — ESTRADIOL 0.1 MG/GM VA CREA: TOPICAL_CREAM | VAGINAL | 4 refills | Status: DC

## 2016-09-29 MED ORDER — ESZOPICLONE 3 MG PO TABS: 3.0000 mg | ORAL_TABLET | Freq: Every day | ORAL | 5 refills | Status: DC

## 2016-09-29 MED ORDER — VALACYCLOVIR HCL 1 G PO TABS: 1000.0000 mg | ORAL_TABLET | Freq: Every day | ORAL | 1 refills | Status: DC

## 2016-09-29 NOTE — Progress Notes (Signed)
66 y.o. G7P2 SingleCaucasianF here for annual exam.  Doing well.  Reports she did struggle some with depression this year.  Had weight gain with some medication changes.  So, medications really needed to be adjusted.  She is feeling better and has worked on weight loss.  Says she's lost about 10 pounds and very pleased with that change.  Denies vaginal bleeding except reports with last episode of sexual activity, she has some light pink spotting afterwards.  Reports it felt very "dry" and tight.  Would like to discuss options.  Patient's last menstrual period was 08/07/1998.          Sexually active: Yes.    The current method of family planning is status post hysterectomy.    Exercising: Yes.    cardio Smoker:  yes  Health Maintenance: Pap:  ?2009  History of abnormal Pap:  no MMG:  05/08/16 BIRADS 1 negative  Colonoscopy:  08/18/14 with Dr. Ardis Hughs BMD:   6/10  TDaP:  2009  Pneumonia vaccine(s):  never Zostavax:   never Hep C testing: drawn today Screening Labs: drawn today, Hb today: same   reports that she has been smoking Cigarettes.  She has never used smokeless tobacco. She reports that she drinks about 2.4 oz of alcohol per week . She reports that she does not use drugs.  Past Medical History:  Diagnosis Date  . Anxiety   . Complication of anesthesia    past hx. laryngospasm following 2 past surgeries, not recent  . Depression   . GERD (gastroesophageal reflux disease)   . Hyperlipidemia   . Insomnia    periodically uses med.  . Perforated sigmoid colon ([place]) 04/28/14   during colonoscopy, "rupture colon"became septic, has colostomy and open surgical wound  . Suicide attempt   . Vitamin D deficiency     Past Surgical History:  Procedure Laterality Date  . ABDOMINAL HYSTERECTOMY  1999   TAH/BSO  . APPLICATION OF WOUND VAC  04/28/2014   Procedure: APPLICATION OF WOUND VAC;  Surgeon: Rolm Bookbinder, MD;  Location: [place];  Service: General;;  . BLEPHAROPLASTY  2010  .  BREAST SURGERY  2001   Breast reduction  . COLOSTOMY N/A 04/28/2014   Procedure: COLOSTOMY;  Surgeon: Rolm Bookbinder, MD;  Location: [place];  Service: General;  Laterality: N/A;  . COLOSTOMY REVISION N/A 04/28/2014   Procedure: COLON RESECTION SIGMOID;  Surgeon: Rolm Bookbinder, MD;  Location: [place];  Service: General;  Laterality: N/A;  . COLOSTOMY TAKEDOWN N/A 09/02/2014   Procedure: LAPAROSCOPIC COLOSTOMY REVERSAL;  Surgeon: [name], MD;  [facility];  Service: General;  Laterality: N/A;  . COSMETIC SURGERY    . DEBRIDEMENT TENNIS ELBOW    . DILATION AND CURETTAGE OF UTERUS     x3, hysteroscopy  . ELBOW SURGERY  2007  . EYE SURGERY     Blepharoplasty  . LAPAROTOMY N/A 04/28/2014   Procedure: EXPLORATORY LAPAROTOMY,SIGMOID COLECTOMY;  Surgeon: Rolm Bookbinder, MD;  Location: [place];  Service: General;  Laterality: N/A;  . scar and adhesion repair  06/01/15   and liposuction  . ULNAR TUNNEL RELEASE Left 2010    Current Outpatient Prescriptions  Medication Sig Dispense Refill  . buPROPion (WELLBUTRIN XL) 150 MG 24 hr tablet   2  . citalopram (CELEXA) 20 MG tablet   2  . esomeprazole (NEXIUM) 40 MG capsule Take 1 capsule (40 mg total) by mouth 2 (two) times daily before a meal. 60 capsule 6  .  Eszopiclone 3 MG TABS Take 1 tablet (3 mg total) by mouth at bedtime. Take immediately before bedtime 30 tablet 5  . ibuprofen (ADVIL,MOTRIN) 200 MG tablet Take 800 mg by mouth every 6 (six) hours as needed for mild pain or moderate pain.    Marland Kitchen ondansetron (ZOFRAN) 4 MG tablet Take 1 tablet (4 mg total) by mouth daily. 30 tablet 6  . ranitidine (ZANTAC) 150 MG tablet Take 2 tablets (300 mg total) by mouth at bedtime. 60 tablet 3  . valACYclovir (VALTREX) 500 MG tablet As needed  10  . Vitamin D, Ergocalciferol, (DRISDOL) 50000 units CAPS capsule Take 1 capsule (50,000 Units total) by mouth every 14 (fourteen) days. 6 capsule 4   No current facility-administered medications for this  visit.     Family History  Problem Relation Age of Onset  . Hyperlipidemia Father   . Hypertension Father   . Diabetes Father   . Cancer Father     History of bladder cancer  . Heart disease Father   . Other Father     4 ft intestinal removed-had gangrene in gallbladder, then spread to intestines  . Hypertension Mother   . Diabetes Mother   . Heart disease Maternal Grandmother   . Stroke Maternal Grandfather   . Hyperlipidemia Brother   . Cancer Paternal Grandmother     uterine  . Cancer Paternal Grandfather     pancreatic cancer  . Esophageal cancer Sister     ROS:  Pertinent items are noted in HPI.  Otherwise, a comprehensive ROS was negative.  Exam:   BP (!) 102/58 (BP Location: Right Arm, Patient Position: Sitting, Cuff Size: Normal)   Pulse 78   Resp 14   Ht 5' 5.75" (1.67 m)   Wt 157 lb (71.2 kg)   LMP 08/07/1998   BMI 25.53 kg/m   Weight change: +3#  Height: 5' 5.75" (167 cm)  Ht Readings from Last 3 Encounters:  09/29/16 5' 5.75" (1.67 m)  11/05/15 5' 6.25" (1.683 m)  09/02/15 5' 6.5" (1.689 m)    General appearance: alert, cooperative and appears stated age Head: Normocephalic, without obvious abnormality, atraumatic Neck: no adenopathy, supple, symmetrical, trachea midline and thyroid normal to inspection and palpation Lungs: clear to auscultation bilaterally Breasts: normal appearance, no masses or tenderness Heart: regular rate and rhythm Abdomen: soft, non-tender; bowel sounds normal; no masses,  no organomegaly Extremities: extremities normal, atraumatic, no cyanosis or edema Skin: Skin color, texture, turgor normal. No rashes or lesions Lymph nodes: Cervical, supraclavicular, and axillary nodes normal. No abnormal inguinal nodes palpated Neurologic: Grossly normal   Pelvic: External genitalia:  no lesions              Urethra:  normal appearing urethra with no masses, tenderness or lesions              Bartholins and Skenes: normal                  Vagina: normal appearing without discharge but with atrophic chnages, no lesions              Cervix: absent              Pap taken: No. Bimanual Exam:  Uterus:  uterus absent              Adnexa: no mass, fullness, tenderness               Rectovaginal: Confirms  Anus:  normal sphincter tone, no lesions  Chaperone was present for exam.  A:  Well Woman with normal exam PMP, no HRT H/O TAH/BSO 1999 H/O depression with prior suicide attempt H/O elevated lipids and has declined treatment H/O colon perforation that occurred with colonoscopy (9/15) treated with exploratory laparotomy with colon resection and colostomy placement.  Colostomy reversal 1/16.  Then scar revision due to significance of scarring that occurred.  Very please with scar revision. Atrophic changes Fever blisters  P:   Mammogram guidelines reviewed.  D/w pt 3D. pap smear not indicated Vit D, TSH, Lipids, CBC, CMP Hep C  Lunesta 3mg  nightly prn.  #30/5RF Trial of estrace vaginal cream 1gm pv twice weekly.  #30 grams.  Pt to call with update. Valtrex 1gm, 2 tab po q 12 hrs for two doses Vit D 50,000 IU every 14 days.  #6/4RF return annually or prn

## 2016-09-30 LAB — VITAMIN D 25 HYDROXY (VIT D DEFICIENCY, FRACTURES): Vit D, 25-Hydroxy: 27 ng/mL — ABNORMAL LOW (ref 30–100)

## 2016-10-02 ENCOUNTER — Other Ambulatory Visit: Payer: Self-pay | Admitting: *Deleted

## 2016-10-02 ENCOUNTER — Telehealth: Payer: Self-pay | Admitting: *Deleted

## 2016-10-02 LAB — IRON,TIBC AND FERRITIN PANEL
%SAT: 22 % (ref 11–50)
Ferritin: 6 ng/mL — ABNORMAL LOW (ref 20–288)
Iron: 97 ug/dL (ref 45–160)
TIBC: 448 ug/dL (ref 250–450)

## 2016-10-02 MED FILL — CITALOPRAM HBR 20 MG TABLET: 20 | 90 days supply | Qty: 90 | Fill #0

## 2016-10-02 MED FILL — VIT D2 1.25 MG (50,000 UNIT: 1.25 MG | 84 days supply | Qty: 6 | Fill #0

## 2016-10-02 MED FILL — ESTRADIOL 0.1 MG/GM CRM: 0.1 | 90 days supply | Qty: 43 | Fill #0

## 2016-10-02 MED FILL — ESZOPICLONE 3 MG TABLET: 3 | 30 days supply | Qty: 30 | Fill #2

## 2016-10-02 NOTE — Telephone Encounter (Signed)
Call to patient. All results reviewed with patient as seen below from Dr. Sabra Heck and patient verbalized understanding. Per Phlebotomist, Tish, will be able to add iron level, TIBC, and ferritin to blood work drawn on Friday (09/29/16). Patient aware. Patient also aware she can stop by the office at her convenience to pick up an IFOB kit.    Routing to provider for final review. Patient agreeable to disposition. Will close encounter.

## 2016-10-02 NOTE — Telephone Encounter (Signed)
-----   Message from Megan Salon, MD sent at 09/30/2016  9:38 PM EST ----- Please let pt know her Vit D was 27.  She is on prescription dosing for this and should continue it.  Her TSH was normal and her CMP was normal except glucose 100.  I do not feel she needs additional testing for this at this time.    Her lipids were 241 (best they've been in a long time) and LDLs were 152.  Triglycerides were normal at 98 and HDLs good at 69.  Her LDLs are still right at the place where treatment is often started but I do think it's ok to repeat this next year if she wants and just watch.  Her Hep C was negative.  Her CBC showed normal WBC count but hb was 10.0 and this looks like iron deficiency anemia.  However, I would like to do some iron studies.  I'd like to add on an iron level, TIBC, and ferritin level.  If that can be added to the blood from last week--please have it added.  If not, please see if Mrs. Copas will return.  Also, I'd like her to do an IFOB for blood.  I have put in an order for that test.  Thanks.

## 2016-10-03 ENCOUNTER — Telehealth: Payer: Self-pay

## 2016-10-03 NOTE — Telephone Encounter (Signed)
Patient returning your call.

## 2016-10-03 NOTE — Telephone Encounter (Signed)
Spoke with patient. Advised of message as seen below from [place]. Patient is agreeable and verbalizes understanding. 6 week recheck scheduled for 11/13/2016 at 10:30 am. Patient is agreeable to date and time.  Routing to provider for final review. Patient agreeable to disposition. Will close encounter.

## 2016-10-03 NOTE — Telephone Encounter (Signed)
-----   Message from Salvadore Dom, MD sent at 10/02/2016  5:50 PM EST ----- Please let the patient know that her ferritin is low, this goes along with iron def. Anemia. She should be taking daily iron supplements (ie: ferrous gluconate or slow fe). Dr Sabra Heck was going to have her do a IFOB to check for blood in her stool. Please have her come back in 6 weeks for a CBC and ferritin.

## 2016-10-03 NOTE — Telephone Encounter (Signed)
Left message to call Kajol Crispen at [phone]. 

## 2016-10-11 LAB — FECAL OCCULT BLOOD, IMMUNOCHEMICAL: IFOBT: NEGATIVE

## 2016-10-11 NOTE — Addendum Note (Signed)
Addended by: Terence Lux A on: 10/11/2016 08:37 AM   Modules accepted: Orders

## 2016-10-22 ENCOUNTER — Encounter (HOSPITAL_COMMUNITY): Payer: Self-pay | Admitting: Emergency Medicine

## 2016-10-22 ENCOUNTER — Ambulatory Visit (HOSPITAL_COMMUNITY)
Admission: EM | Admit: 2016-10-22 | Discharge: 2016-10-22 | Disposition: A | Discharge status: Home / Self Care | Payer: 59 | Attending: Family Medicine | Admitting: Family Medicine

## 2016-10-22 DIAGNOSIS — J209 Acute bronchitis, unspecified: Secondary | ICD-10-CM

## 2016-10-22 DIAGNOSIS — B001 Herpesviral vesicular dermatitis: Secondary | ICD-10-CM

## 2016-10-22 MED ORDER — ACYCLOVIR 5 % EX OINT: 1.0000 "application " | TOPICAL_OINTMENT | CUTANEOUS | 1 refills | Status: DC

## 2016-10-22 MED ORDER — PREDNISONE 50 MG PO TABS: ORAL_TABLET | ORAL | 0 refills | Status: DC

## 2016-10-22 MED ORDER — DOXYCYCLINE HYCLATE 100 MG PO CAPS
100.0000 mg | ORAL_CAPSULE | Freq: Two times a day (BID) | ORAL | 0 refills | Status: DC
Start: 2016-10-22 — End: 2017-08-08

## 2016-10-22 MED ORDER — VALACYCLOVIR HCL 1 G PO TABS: 1000.0000 mg | ORAL_TABLET | Freq: Two times a day (BID) | ORAL | 1 refills | Status: AC

## 2016-10-22 MED ORDER — BENZONATATE 100 MG PO CAPS: 100.0000 mg | ORAL_CAPSULE | Freq: Three times a day (TID) | ORAL | 0 refills | Status: DC

## 2016-10-22 NOTE — ED Triage Notes (Signed)
The patient presented to the [facility] with a cough and congestion x 2 weeks. The patient also complained of a fever blister on her mouth that started this morning. The patient stated that she usually used acyclovir.

## 2016-10-22 NOTE — ED Provider Notes (Signed)
CSN: [id number]     Arrival date & time 10/22/16  1213 History   None    Chief Complaint  Patient presents with  . Cough   (Consider location/radiation/quality/duration/timing/severity/associated sxs/prior Treatment) 66 year old female presents with a 2 week history of dry hacking nonproductive cough along with a 3-4 day history of fever blister on the right side of her mouth.   The history is provided by the patient.  Cough  Cough characteristics:  Non-productive, dry and hacking Sputum characteristics:  Yellow Severity:  Moderate Onset quality:  Gradual Duration:  2 weeks Timing:  Constant Progression:  Worsening Chronicity:  New Smoker: yes   Context: smoke exposure, upper respiratory infection and weather changes   Context: not animal exposure, not exposure to allergens, not occupational exposure, not sick contacts and not with activity   Relieved by:  Cough suppressants and decongestant Worsened by:  Nothing Associated symptoms: sinus congestion and wheezing   Associated symptoms: no chest pain, no chills, no ear fullness, no ear pain, no fever, no headaches, no myalgias, no rhinorrhea, no shortness of breath and no sore throat     Past Medical History:  Diagnosis Date  . Anxiety   . Complication of anesthesia    past hx. laryngospasm following 2 past surgeries, not recent  . Depression   . GERD (gastroesophageal reflux disease)   . Hyperlipidemia   . Insomnia    periodically uses med.  . Perforated sigmoid colon ([facility]) 04/28/14   during colonoscopy, "rupture colon"became septic, has colostomy and open surgical wound  . Suicide attempt   . Vitamin D deficiency    Past Surgical History:  Procedure Laterality Date  . ABDOMINAL HYSTERECTOMY  1999   TAH/BSO  . APPLICATION OF WOUND VAC  04/28/2014   Procedure: APPLICATION OF WOUND VAC;  Surgeon: Rolm Bookbinder, MD;  Location: [place];  Service: General;;  . BLEPHAROPLASTY  2010  . BREAST SURGERY  2001   Breast  reduction  . COLOSTOMY N/A 04/28/2014   Procedure: COLOSTOMY;  Surgeon: Rolm Bookbinder, MD;  Location: [place];  Service: General;  Laterality: N/A;  . COLOSTOMY REVISION N/A 04/28/2014   Procedure: COLON RESECTION SIGMOID;  Surgeon: Rolm Bookbinder, MD;  Location: [place];  Service: General;  Laterality: N/A;  . COLOSTOMY TAKEDOWN N/A 09/02/2014   Procedure: LAPAROSCOPIC COLOSTOMY REVERSAL;  Surgeon: [name], MD;  Location: [facility];  Service: General;  Laterality: N/A;  . COSMETIC SURGERY    . DEBRIDEMENT TENNIS ELBOW    . DILATION AND CURETTAGE OF UTERUS     x3, hysteroscopy  . ELBOW SURGERY  2007  . EYE SURGERY     Blepharoplasty  . LAPAROTOMY N/A 04/28/2014   Procedure: EXPLORATORY LAPAROTOMY,SIGMOID COLECTOMY;  Surgeon: Rolm Bookbinder, MD;  Location: [place];  Service: General;  Laterality: N/A;  . scar and adhesion repair  06/01/15   and liposuction  . ULNAR TUNNEL RELEASE Left 2010   Family History  Problem Relation Age of Onset  . Hyperlipidemia Father   . Hypertension Father   . Diabetes Father   . Cancer Father     History of bladder cancer  . Heart disease Father   . Other Father     4 ft intestinal removed-had gangrene in gallbladder, then spread to intestines  . Hypertension Mother   . Diabetes Mother   . Heart disease Maternal Grandmother   . Stroke Maternal Grandfather   . Hyperlipidemia Brother   . Cancer Paternal Grandmother  uterine  . Cancer Paternal Grandfather     pancreatic cancer  . Esophageal cancer Sister    Social History  Substance Use Topics  . Smoking status: Light Tobacco Smoker    Types: Cigarettes    Last attempt to quit: 09/01/2003  . Smokeless tobacco: Never Used     Comment: occasional  . Alcohol use 2.4 oz/week    4 Standard drinks or equivalent per week   OB History    Gravida Para Term Preterm AB Living   7 2       2    SAB TAB Ectopic Multiple Live Births                 Review of Systems  Constitutional: Negative  for chills and fever.  HENT: Negative for ear pain, rhinorrhea and sore throat.   Respiratory: Positive for cough and wheezing. Negative for shortness of breath.   Cardiovascular: Negative for chest pain.  Musculoskeletal: Negative for myalgias.  Neurological: Negative for headaches.  All other systems reviewed and are negative.   Allergies  Bee venom and Statins  Home Medications   Prior to Admission medications   Medication Sig Start Date End Date Taking? Authorizing Provider  acyclovir ointment (ZOVIRAX) 5 % Apply 1 application topically every 3 (three) hours. 10/22/16   Barnet Glasgow, NP  benzonatate (TESSALON) 100 MG capsule Take 1 capsule (100 mg total) by mouth every 8 (eight) hours. 10/22/16   Barnet Glasgow, NP  buPROPion (WELLBUTRIN XL) 150 MG 24 hr tablet  09/13/16   Historical Provider, MD  citalopram (CELEXA) 20 MG tablet  08/28/16   Historical Provider, MD  doxycycline (VIBRAMYCIN) 100 MG capsule Take 1 capsule (100 mg total) by mouth 2 (two) times daily. 10/22/16   Barnet Glasgow, NP  esomeprazole (NEXIUM) 40 MG capsule Take 1 capsule (40 mg total) by mouth 2 (two) times daily before a meal. 09/06/16   Milus Banister, MD  estradiol (ESTRACE) 0.1 MG/GM vaginal cream 1 gram vaginally twice weekly 09/29/16   Megan Salon, MD  Eszopiclone 3 MG TABS Take 1 tablet (3 mg total) by mouth at bedtime. Take immediately before bedtime 09/29/16   Megan Salon, MD  ibuprofen (ADVIL,MOTRIN) 200 MG tablet Take 800 mg by mouth every 6 (six) hours as needed for mild pain or moderate pain.    Historical Provider, MD  ondansetron (ZOFRAN) 4 MG tablet Take 1 tablet (4 mg total) by mouth daily. 11/05/15   Milus Banister, MD  predniSONE (DELTASONE) 50 MG tablet 5 10/22/16   Barnet Glasgow, NP  ranitidine (ZANTAC) 150 MG tablet Take 2 tablets (300 mg total) by mouth at bedtime. 04/05/16   Milus Banister, MD  valACYclovir (VALTREX) 1000 MG tablet Take 1 tablet (1,000 mg total) by mouth 2 (two)  times daily. 10/22/16 11/05/16  Barnet Glasgow, NP  Vitamin D, Ergocalciferol, (DRISDOL) 50000 units CAPS capsule Take 1 capsule (50,000 Units total) by mouth every 14 (fourteen) days. 09/29/16   Megan Salon, MD   Meds Ordered and Administered this Visit  Medications - No data to display  BP 137/77 (BP Location: Left Arm)   Pulse 95   Temp 98.1 F (36.7 C) (Oral)   Resp 18   LMP 08/07/1998   SpO2 98%  No data found.   Physical Exam  Constitutional: She is oriented to person, place, and time. She appears well-developed and well-nourished. She does not have a sickly appearance. She does not appear ill.  No distress.  HENT:  Head: Normocephalic and atraumatic.  Right Ear: Tympanic membrane and external ear normal.  Left Ear: Tympanic membrane and external ear normal.  Nose: Nose normal. Right sinus exhibits no maxillary sinus tenderness and no frontal sinus tenderness. Left sinus exhibits no maxillary sinus tenderness and no frontal sinus tenderness.  Mouth/Throat: Uvula is midline and oropharynx is clear and moist. No oropharyngeal exudate.  Eyes: Pupils are equal, round, and reactive to light.  Neck: Normal range of motion. Neck supple. No JVD present.  Cardiovascular: Normal rate and regular rhythm.   Pulmonary/Chest: Effort normal and breath sounds normal. No respiratory distress. She has no wheezes.  Abdominal: Soft. Bowel sounds are normal. She exhibits no distension. There is no tenderness. There is no guarding.  Lymphadenopathy:       Head (right side): No submental, no submandibular and no tonsillar adenopathy present.       Head (left side): No submental, no submandibular and no tonsillar adenopathy present.    She has no cervical adenopathy.  Neurological: She is alert and oriented to person, place, and time.  Skin: Skin is warm and dry. Capillary refill takes less than 2 seconds. Rash noted. Rash is vesicular. She is not diaphoretic.     Psychiatric: She has a normal mood  and affect.  Nursing note and vitals reviewed.   Urgent Care Course     Procedures (including critical care time)  Labs Review Labs Reviewed - No data to display  Imaging Review No results found.     MDM   1. Acute bronchitis, unspecified organism   2. Fever blister    Prescription for valacyclovir, and Zovirax refilled.  Treating for acute bronchitis with doxycycline, prednisone, and Tessalon. Provide counseling with over-the-counter therapies, advised for follow-up with primary care should symptoms persist.    Barnet Glasgow, NP 10/22/16 1446

## 2016-10-22 NOTE — Discharge Instructions (Signed)
I am treating you for acute bronchitis. I have prescribed doxycyline 1 tablet daily for 7 days. I have prescribed prednisone, one tablet daily with food, For cough, I have prescribed a medication called Tessalon. Take 1 tablet every 8 hours as needed for your cough.   For your fever blister, I have refilled your Valacyclovir and your zovirax, should your symptoms persist, follow up with your primary care provider or return to clinic as needed.

## 2016-10-26 MED FILL — BUPROPION HCL XL 150 MG TAB: 150 | 90 days supply | Qty: 180 | Fill #0

## 2016-11-08 ENCOUNTER — Other Ambulatory Visit: Payer: Self-pay | Admitting: Gastroenterology

## 2016-11-08 MED FILL — raNITIdine HCL 150 MG TABS: 150 | 30 days supply | Qty: 60 | Fill #0

## 2016-11-08 MED FILL — ESOMEPRAZOLE MAG DR 40 MG C: 40 | 30 days supply | Qty: 60 | Fill #1

## 2016-11-08 MED FILL — ESZOPICLONE 3 MG TABLET: 3 | 30 days supply | Qty: 30 | Fill #3

## 2016-11-13 ENCOUNTER — Other Ambulatory Visit (INDEPENDENT_AMBULATORY_CARE_PROVIDER_SITE_OTHER): Payer: 59

## 2016-11-13 DIAGNOSIS — D649 Anemia, unspecified: Secondary | ICD-10-CM | POA: Diagnosis not present

## 2016-11-13 LAB — CBC WITH DIFFERENTIAL/PLATELET
Basophils Absolute: 61 cells/uL (ref 0–200)
Basophils Relative: 1 %
Eosinophils Absolute: 183 cells/uL (ref 15–500)
Eosinophils Relative: 3 %
HCT: 35 % (ref 35.0–45.0)
Hemoglobin: 11.2 g/dL — ABNORMAL LOW (ref 11.7–15.5)
Lymphocytes Relative: 33 %
Lymphs Abs: 2013 cells/uL (ref 850–3900)
MCH: 27.1 pg (ref 27.0–33.0)
MCHC: 32 g/dL (ref 32.0–36.0)
MCV: 84.5 fL (ref 80.0–100.0)
MPV: 10.1 fL (ref 7.5–12.5)
Monocytes Absolute: 366 cells/uL (ref 200–950)
Monocytes Relative: 6 %
Neutro Abs: 3477 cells/uL (ref 1500–7800)
Neutrophils Relative %: 57 %
Platelets: 354 10*3/uL (ref 140–400)
RBC: 4.14 MIL/uL (ref 3.80–5.10)
RDW: 22 % — ABNORMAL HIGH (ref 11.0–15.0)
WBC: 6.1 10*3/uL (ref 3.8–10.8)

## 2016-11-14 LAB — FERRITIN: Ferritin: 14 ng/mL — ABNORMAL LOW (ref 20–288)

## 2016-11-18 ENCOUNTER — Encounter: Payer: Self-pay | Admitting: Obstetrics & Gynecology

## 2016-11-18 ENCOUNTER — Other Ambulatory Visit: Payer: Self-pay | Admitting: Obstetrics & Gynecology

## 2016-11-18 DIAGNOSIS — D508 Other iron deficiency anemias: Secondary | ICD-10-CM

## 2016-11-20 ENCOUNTER — Telehealth: Payer: Self-pay

## 2016-11-20 NOTE — Telephone Encounter (Signed)
-----   Message from Megan Salon, MD sent at 11/18/2016  7:09 AM EDT ----- Regarding: follow up lab work Pt needs repeat CBC and ferritin level scheduled in three months.  Message sent to her via mychart regarding results.  Please call to schedule.  Orders have been placed.  Thanks.  MSM

## 2016-11-20 NOTE — Telephone Encounter (Signed)
Spoke with patient. 3 month follow up lab work scheduled for 02/19/2017 at 10:10 am. Patient is agreeable to date and time.  Routing to provider for final review. Patient agreeable to disposition. Will close encounter.

## 2016-12-13 DIAGNOSIS — H5203 Hypermetropia, bilateral: Secondary | ICD-10-CM | POA: Diagnosis not present

## 2016-12-13 DIAGNOSIS — H524 Presbyopia: Secondary | ICD-10-CM | POA: Diagnosis not present

## 2016-12-19 MED FILL — ESZOPICLONE 3 MG TABLET: 3 | 30 days supply | Qty: 30 | Fill #4

## 2016-12-19 MED FILL — CYCLOBENZAPRINE 10 MG TAB: 10 | 10 days supply | Qty: 30 | Fill #0

## 2016-12-26 DIAGNOSIS — F332 Major depressive disorder, recurrent severe without psychotic features: Secondary | ICD-10-CM | POA: Diagnosis not present

## 2016-12-28 MED FILL — raNITIdine HCL 150 MG TABS: 150 | 30 days supply | Qty: 60 | Fill #1

## 2016-12-28 MED FILL — TOPIRAMATE 25 MG TAB: 25 | 30 days supply | Qty: 30 | Fill #0

## 2017-01-10 MED FILL — ESOMEPRAZOLE MAG DR 40 MG C: 40 | 30 days supply | Qty: 60 | Fill #2

## 2017-01-15 MED FILL — ESZOPICLONE 3 MG TABLET: 3 | 30 days supply | Qty: 30 | Fill #5

## 2017-01-15 MED FILL — CITALOPRAM HBR 20 MG TABLET: 20 | 90 days supply | Qty: 90 | Fill #1

## 2017-01-31 MED FILL — BUPROPION XL 150 MG TAB: 150 | 90 days supply | Qty: 180 | Fill #1

## 2017-01-31 MED FILL — raNITIdine HCL 150 MG TABS: 150 | 30 days supply | Qty: 60 | Fill #2

## 2017-01-31 MED FILL — TOPIRAMATE 25 MG TAB: 25 | 30 days supply | Qty: 30 | Fill #1

## 2017-02-19 ENCOUNTER — Other Ambulatory Visit: Payer: Self-pay | Admitting: *Deleted

## 2017-02-19 ENCOUNTER — Other Ambulatory Visit (INDEPENDENT_AMBULATORY_CARE_PROVIDER_SITE_OTHER): Payer: 59

## 2017-02-19 DIAGNOSIS — D508 Other iron deficiency anemias: Secondary | ICD-10-CM

## 2017-02-19 MED FILL — ESZOPICLONE 3 MG TABLET: 3 | 30 days supply | Qty: 30 | Fill #0

## 2017-02-20 LAB — CBC
Hematocrit: 38.5 % (ref 34.0–46.6)
Hemoglobin: 12.6 g/dL (ref 11.1–15.9)
MCH: 31 pg (ref 26.6–33.0)
MCHC: 32.7 g/dL (ref 31.5–35.7)
MCV: 95 fL (ref 79–97)
Platelets: 321 10*3/uL (ref 150–379)
RBC: 4.07 x10E6/uL (ref 3.77–5.28)
RDW: 14.8 % (ref 12.3–15.4)
WBC: 5.1 10*3/uL (ref 3.4–10.8)

## 2017-02-20 LAB — FERRITIN: Ferritin: 34 ng/mL (ref 15–150)

## 2017-03-11 MED FILL — ESOMEPRAZOLE MAG DR 40 MG C: 40 | 30 days supply | Qty: 60 | Fill #3

## 2017-03-15 DIAGNOSIS — F332 Major depressive disorder, recurrent severe without psychotic features: Secondary | ICD-10-CM | POA: Diagnosis not present

## 2017-03-19 MED FILL — raNITIdine HCL 150 MG TABS: 150 | 30 days supply | Qty: 60 | Fill #3

## 2017-03-21 MED FILL — ESZOPICLONE 3 MG TABLET: 3 | 30 days supply | Qty: 30 | Fill #1

## 2017-04-30 ENCOUNTER — Other Ambulatory Visit: Payer: Self-pay | Admitting: Obstetrics & Gynecology

## 2017-04-30 MED FILL — TOPIRAMATE 25 MG TAB: 25 | 30 days supply | Qty: 30 | Fill #2

## 2017-04-30 NOTE — Telephone Encounter (Signed)
Medication refill request: Eszopiclone 3mg  Last AEX:  09/29/16 SM Next AEX: 01/11/18  Last MMG (if hormonal medication request): 05/08/16 BIRADS 1 negative/density c Refill authorized: 09/29/16 #30 w/5 refills; today please advise

## 2017-05-01 MED FILL — ESZOPICLONE 3 MG TABS: 3 | 30 days supply | Qty: 30 | Fill #0

## 2017-05-07 MED FILL — ESOMEPRAZOLE MAG DR 40 MG C: 40 | 30 days supply | Qty: 60 | Fill #4

## 2017-05-08 MED FILL — CITALOPRAM HBR 20 MG TABLET: 20 | 90 days supply | Qty: 90 | Fill #0

## 2017-05-10 MED FILL — buPROPion HCL ER (XL) 150 M: 150 | 90 days supply | Qty: 180 | Fill #0

## 2017-05-28 ENCOUNTER — Other Ambulatory Visit: Payer: Self-pay | Admitting: Gastroenterology

## 2017-06-12 MED FILL — ESOMEPRAZOLE MAG DR 40 MG C: 40 | 30 days supply | Qty: 60 | Fill #5

## 2017-06-12 MED FILL — ESZOPICLONE 3 MG TABS: 3 | 30 days supply | Qty: 30 | Fill #1

## 2017-06-12 MED FILL — TOPIRAMATE 25 MG TAB: 25 | 30 days supply | Qty: 30 | Fill #0

## 2017-07-02 MED FILL — NITROFURANTOIN MONO-MCR 100: 100 | 5 days supply | Qty: 10 | Fill #0

## 2017-07-09 MED FILL — TOPIRAMATE 25 MG TAB: 25 | 30 days supply | Qty: 30 | Fill #1

## 2017-07-13 MED FILL — ESZOPICLONE 3 MG TABS: 3 | 30 days supply | Qty: 30 | Fill #2

## 2017-08-01 ENCOUNTER — Other Ambulatory Visit: Payer: Self-pay | Admitting: Obstetrics & Gynecology

## 2017-08-01 DIAGNOSIS — Z1231 Encounter for screening mammogram for malignant neoplasm of breast: Secondary | ICD-10-CM

## 2017-08-08 ENCOUNTER — Encounter: Payer: Self-pay | Admitting: Family Medicine

## 2017-08-08 ENCOUNTER — Ambulatory Visit (INDEPENDENT_AMBULATORY_CARE_PROVIDER_SITE_OTHER): Payer: 59 | Admitting: Family Medicine

## 2017-08-08 VITALS — BP 128/88 | HR 81 | Ht 66.0 in | Wt 169.4 lb

## 2017-08-08 DIAGNOSIS — Z0000 Encounter for general adult medical examination without abnormal findings: Secondary | ICD-10-CM | POA: Diagnosis not present

## 2017-08-08 DIAGNOSIS — R0789 Other chest pain: Secondary | ICD-10-CM | POA: Diagnosis not present

## 2017-08-08 DIAGNOSIS — F329 Major depressive disorder, single episode, unspecified: Secondary | ICD-10-CM

## 2017-08-08 DIAGNOSIS — K219 Gastro-esophageal reflux disease without esophagitis: Secondary | ICD-10-CM

## 2017-08-08 DIAGNOSIS — D509 Iron deficiency anemia, unspecified: Secondary | ICD-10-CM

## 2017-08-08 DIAGNOSIS — E785 Hyperlipidemia, unspecified: Secondary | ICD-10-CM

## 2017-08-08 DIAGNOSIS — Z7189 Other specified counseling: Secondary | ICD-10-CM | POA: Diagnosis not present

## 2017-08-08 DIAGNOSIS — Z23 Encounter for immunization: Secondary | ICD-10-CM

## 2017-08-08 DIAGNOSIS — Z7185 Encounter for immunization safety counseling: Secondary | ICD-10-CM

## 2017-08-08 DIAGNOSIS — F32A Depression, unspecified: Secondary | ICD-10-CM

## 2017-08-08 DIAGNOSIS — Z87898 Personal history of other specified conditions: Secondary | ICD-10-CM

## 2017-08-08 LAB — POCT URINALYSIS DIP (PROADVANTAGE DEVICE)
Bilirubin, UA: NEGATIVE
Blood, UA: NEGATIVE
Glucose, UA: NEGATIVE mg/dL
Ketones, POC UA: NEGATIVE mg/dL
Nitrite, UA: NEGATIVE
Protein Ur, POC: NEGATIVE mg/dL
Specific Gravity, Urine: 1.02
Urobilinogen, Ur: 3.5
pH, UA: 6 (ref 5.0–8.0)

## 2017-08-08 MED ORDER — ESOMEPRAZOLE MAGNESIUM 40 MG PO CPDR: 40.0000 mg | DELAYED_RELEASE_CAPSULE | Freq: Every day | ORAL | 5 refills | Status: DC

## 2017-08-08 NOTE — Patient Instructions (Signed)
Call and check with your insurance regarding Shingrix vaccine. You can return to our office for a nurse visit if you decide to get this. Call and schedule ahead of time to make sure we have the vaccine in stock.   Try to cut back on Nexium use.   We will call you with your lab results.    Preventative Care for Adults - Female      MAINTAIN REGULAR HEALTH EXAMS:  A routine yearly physical is a good way to check in with your primary care provider about your health and preventive screening. It is also an opportunity to share updates about your health and any concerns you have, and receive a thorough all-over exam.   Most health insurance companies pay for at least some preventative services.  Check with your health plan for specific coverages.  WHAT PREVENTATIVE SERVICES DO WOMEN NEED?  Adult women should have their weight and blood pressure checked regularly.   Women age 64 and older should have their cholesterol levels checked regularly.  Women should be screened for cervical cancer with a Pap smear and pelvic exam beginning at either age 32, or 3 years after they become sexually activity.    Breast cancer screening generally begins at age 17 with a mammogram and breast exam by your primary care provider.    Beginning at age 30 and continuing to age 19, women should be screened for colorectal cancer.  Certain people may need continued testing until age 47.  Updating vaccinations is part of preventative care.  Vaccinations help protect against diseases such as the flu.  Osteoporosis is a disease in which the bones lose minerals and strength as we age. Women ages 87 and over should discuss this with their caregivers, as should women after menopause who have other risk factors.  Lab tests are generally done as part of preventative care to screen for anemia and blood disorders, to screen for problems with the kidneys and liver, to screen for bladder problems, to check blood sugar, and to  check your cholesterol level.  Preventative services generally include counseling about diet, exercise, avoiding tobacco, drugs, excessive alcohol consumption, and sexually transmitted infections.    GENERAL RECOMMENDATIONS FOR GOOD HEALTH:  Healthy diet:  Eat a variety of foods, including fruit, vegetables, animal or vegetable protein, such as meat, fish, chicken, and eggs, or beans, lentils, tofu, and grains, such as rice.  Drink plenty of water daily.  Decrease saturated fat in the diet, avoid lots of red meat, processed foods, sweets, fast foods, and fried foods.  Exercise:  Aerobic exercise helps maintain good heart health. At least 30-40 minutes of moderate-intensity exercise is recommended. For example, a brisk walk that increases your heart rate and breathing. This should be done on most days of the week.   Find a type of exercise or a variety of exercises that you enjoy so that it becomes a part of your daily life.  Examples are running, walking, swimming, water aerobics, and biking.  For motivation and support, explore group exercise such as aerobic class, spin class, Zumba, Yoga,or  martial arts, etc.    Set exercise goals for yourself, such as a certain weight goal, walk or run in a race such as a 5k walk/run.  Speak to your primary care provider about exercise goals.  Disease prevention:  If you smoke or chew tobacco, find out from your caregiver how to quit. It can literally save your life, no matter how long you have  been a tobacco user. If you do not use tobacco, never begin.   Maintain a healthy diet and normal weight. Increased weight leads to problems with blood pressure and diabetes.   The Body Mass Index or BMI is a way of measuring how much of your body is fat. Having a BMI above 27 increases the risk of heart disease, diabetes, hypertension, stroke and other problems related to obesity. Your caregiver can help determine your BMI and based on it develop an exercise  and dietary program to help you achieve or maintain this important measurement at a healthful level.  High blood pressure causes heart and blood vessel problems.  Persistent high blood pressure should be treated with medicine if weight loss and exercise do not work.   Fat and cholesterol leaves deposits in your arteries that can block them. This causes heart disease and vessel disease elsewhere in your body.  If your cholesterol is found to be high, or if you have heart disease or certain other medical conditions, then you may need to have your cholesterol monitored frequently and be treated with medication.   Ask if you should have a cardiac stress test if your history suggests this. A stress test is a test done on a treadmill that looks for heart disease. This test can find disease prior to there being a problem.  Menopause can be associated with physical symptoms and risks. Hormone replacement therapy is available to decrease these. You should talk to your caregiver about whether starting or continuing to take hormones is right for you.   Osteoporosis is a disease in which the bones lose minerals and strength as we age. This can result in serious bone fractures. Risk of osteoporosis can be identified using a bone density scan. Women ages 14 and over should discuss this with their caregivers, as should women after menopause who have other risk factors. Ask your caregiver whether you should be taking a calcium supplement and Vitamin D, to reduce the rate of osteoporosis.   Avoid drinking alcohol in excess (more than two drinks per day).  Avoid use of street drugs. Do not share needles with anyone. Ask for professional help if you need assistance or instructions on stopping the use of alcohol, cigarettes, and/or drugs.  Brush your teeth twice a day with fluoride toothpaste, and floss once a day. Good oral hygiene prevents tooth decay and gum disease. The problems can be painful, unattractive, and can  cause other health problems. Visit your dentist for a routine oral and dental check up and preventive care every 6-12 months.   Look at your skin regularly.  Use a mirror to look at your back. Notify your caregivers of changes in moles, especially if there are changes in shapes, colors, a size larger than a pencil eraser, an irregular border, or development of new moles.  Safety:  Use seatbelts 100% of the time, whether driving or as a passenger.  Use safety devices such as hearing protection if you work in environments with loud noise or significant background noise.  Use safety glasses when doing any work that could send debris in to the eyes.  Use a helmet if you ride a bike or motorcycle.  Use appropriate safety gear for contact sports.  Talk to your caregiver about gun safety.  Use sunscreen with a SPF (or skin protection factor) of 15 or greater.  Lighter skinned people are at a greater risk of skin cancer. Don't forget to also wear sunglasses in order  to protect your eyes from too much damaging sunlight. Damaging sunlight can accelerate cataract formation.   Practice safe sex. Use condoms. Condoms are used for birth control and to help reduce the spread of sexually transmitted infections (or STIs).  Some of the STIs are gonorrhea (the clap), chlamydia, syphilis, trichomonas, herpes, HPV (human papilloma virus) and HIV (human immunodeficiency virus) which causes AIDS. The herpes, HIV and HPV are viral illnesses that have no cure. These can result in disability, cancer and death.   Keep carbon monoxide and smoke detectors in your home functioning at all times. Change the batteries every 6 months or use a model that plugs into the wall.   Vaccinations:  Stay up to date with your tetanus shots and other required immunizations. You should have a booster for tetanus every 10 years. Be sure to get your flu shot every year, since 5%-20% of the [place] population comes down with the flu. The flu vaccine  changes each year, so being vaccinated once is not enough. Get your shot in the fall, before the flu season peaks.   Other vaccines to consider:  Human Papilloma Virus or HPV causes cancer of the cervix, and other infections that can be transmitted from person to person. There is a vaccine for HPV, and females should get immunized between the ages of 92 and 26. It requires a series of 3 shots.   Pneumococcal vaccine to protect against certain types of pneumonia.  This is normally recommended for adults age 33 or older.  However, adults younger than 66 years old with certain underlying conditions such as diabetes, heart or lung disease should also receive the vaccine.  Shingles vaccine to protect against Varicella Zoster if you are older than age 55, or younger than 66 years old with certain underlying illness.  Hepatitis A vaccine to protect against a form of infection of the liver by a virus acquired from food.  Hepatitis B vaccine to protect against a form of infection of the liver by a virus acquired from blood or body fluids, particularly if you work in health care.  If you plan to travel internationally, check with your local health department for specific vaccination recommendations.  Cancer Screening:  Breast cancer screening is essential to preventive care for women. All women age 71 and older should perform a breast self-exam every month. At age 83 and older, women should have their caregiver complete a breast exam each year. Women at ages 28 and older should have a mammogram (x-ray film) of the breasts. Your caregiver can discuss how often you need mammograms.    Cervical cancer screening includes taking a Pap smear (sample of cells examined under a microscope) from the cervix (end of the uterus). It also includes testing for HPV (Human Papilloma Virus, which can cause cervical cancer). Screening and a pelvic exam should begin at age 45, or 3 years after a woman becomes sexually active.  Screening should occur every year, with a Pap smear but no HPV testing, up to age 61. After age 17, you should have a Pap smear every 3 years with HPV testing, if no HPV was found previously.   Most routine colon cancer screening begins at the age of 61. On a yearly basis, doctors may provide special easy to use take-home tests to check for hidden blood in the stool. Sigmoidoscopy or colonoscopy can detect the earliest forms of colon cancer and is life saving. These tests use a small camera at the end of  a tube to directly examine the colon. Speak to your caregiver about this at age 62, when routine screening begins (and is repeated every 5 years unless early forms of pre-cancerous polyps or small growths are found).

## 2017-08-08 NOTE — Progress Notes (Signed)
Subjective:    Patient ID: [name], female    DOB: 01-12-1951, 67 y.o.   MRN: [id number]  HPI Chief Complaint  Patient presents with  . other    CPE, possilble UTI   She is new to the practice and here for a complete physical exam. Previous medical care: sees her OB/GYN.  Last CPE: years ago   Other providers: OB/GYN Dr. Edwinna Areola.  Dermatologist- Dr. Althia Forts Ophthamologist- Dr. Ellie Lunch  Orthopedist- [place]  GI- Dr. Ardis Hughs  Reports a 3 week history of left sided chest pain that has been intermittent with deep inspiration. No chest pain today. No associated symptoms.   She is taking Nexium once daily and Zantac daily for GERD. Reports having 2 EGDs in the past for the same.  History of perforation of colon and temporary colostomy. This was repaired.   States she has had 2 UTIs in past 6 months but only 4 in her adult life. States she also had pyelonephritis. States last dose of antibiotics completed around Christmas. States symptoms have cleared up.    Reports having IDA, states this has been worked up by her OB/GYN and she has been taking iron. States anemia has improved.   Hyperlipidemia - states she cannot take statins  Documentation of suicide attempt in record- she denies this happened and would like to have it removed from her chart.  She does have depression and is taking Wellbutrin and citalopram for this. States her mood is improved. Denies SI or HI.   States she has fallen at home. States she slipped on her hardwood stairs. Denies unexplained bruising.   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 Smokes only a few cigarettes daily. Denies drug use.  Diet: eats one meal per day, usually lunch only.  Excerise: none in 3 years   Immunizations: Tdap 2009. Denies having pneumonia or shingles vaccine.    Depression screen [facility] 2/9 08/08/2017 08/08/2017  01/09/2015 04/14/2014  Decreased Interest 1 1 0 1  Down, Depressed, Hopeless 1 1 1 1   PHQ - 2 Score 2 2 1 2   Altered sleeping 1 - - 3  Tired, decreased energy 1 - - 3  Change in appetite 2 - - 0  Feeling bad or failure about yourself  0 - - 1  Trouble concentrating 0 - - 0  Moving slowly or fidgety/restless 0 - - 0  Suicidal thoughts 0 - - 0  PHQ-9 Score 6 - - 9     Health maintenance:  Mammogram: January 21 DEXA: January 21  Colonoscopy: January 2016  Last Gynecological Exam: hysterectomy  Last Menstrual cycle: hysterectomy  Last Dental Exam: 2x annually  Last Eye Exam: has one scheduled   Wears seatbelt always,  smoke detectors in home and functioning, does not text while driving and feels safe in home environment.   Reviewed allergies, medications, past medical, surgical, family, and social history.    Review of Systems Review of Systems Constitutional: -fever, -chills, -sweats, -unexpected weight change,-fatigue ENT: -runny nose, -ear pain, -sore throat Cardiology:  -chest pain, -palpitations, -edema Respiratory: -cough, -shortness of breath, -wheezing Gastroenterology: -abdominal pain, -nausea, -vomiting, -diarrhea, -constipation  Hematology: -bleeding or bruising problems Musculoskeletal: -arthralgias, -myalgias, -joint swelling, -back pain Ophthalmology: -vision changes Urology: -dysuria, -difficulty urinating, -hematuria, -urinary frequency, -urgency Neurology: -headache, -weakness, -tingling, -numbness       Objective:   Physical Exam  BP 128/88   Pulse 81   Ht 5\' 6"  (1.676 m)   Wt 169 lb 6.4 oz (76.8 kg)   LMP 08/07/1998   SpO2 93%   BMI 27.34 kg/m   General Appearance:    Alert, cooperative, no distress, appears stated age  Head:    Normocephalic, without obvious abnormality, atraumatic  Eyes:    PERRL, conjunctiva/corneas clear, EOM's intact, fundi    benign  Ears:    Normal TM's and external ear canals  Nose:   Nares normal, mucosa normal, no  drainage or sinus   tenderness  Throat:   Lips, mucosa, and tongue normal; teeth and gums normal  Neck:   Supple, no lymphadenopathy;  thyroid:  no   enlargement/tenderness/nodules; no carotid   bruit or JVD  Back:    Spine nontender, no curvature, ROM normal, no CVA     tenderness  Lungs:     Clear to auscultation bilaterally without wheezes, rales or     ronchi; respirations unlabored  Chest Wall:    No tenderness or deformity   Heart:    Regular rate and rhythm, S1 and S2 normal, no murmur, rub   or gallop  Breast Exam:    Not done-OB/GYN  Abdomen:     Soft, non-tender, nondistended, normoactive bowel sounds,    no masses, no hepatosplenomegaly  Genitalia:    Not done-OB/GYN     Extremities:   No clubbing, cyanosis or edema  Pulses:   2+ and symmetric all extremities  Skin:   Skin color, texture, turgor normal, no rashes or lesions. Bruising in various stages of healing to left upper arm.   Lymph nodes:   Cervical, supraclavicular, and axillary nodes normal  Neurologic:   CNII-XII intact, normal strength, sensation and gait; reflexes 2+ and symmetric throughout          Psych:   Normal mood, affect, hygiene and grooming.    Urinalysis dipstick: trace leuks, otherwise neg      Assessment & Plan:  Routine general medical examination at a health care facility - Plan: POCT Urinalysis DIP (Proadvantage Device), CBC with Differential/Platelet, Comprehensive metabolic panel, TSH, Lipid panel, EKG 12-Lead  Gastroesophageal reflux disease, esophagitis presence not specified - Plan: esomeprazole (NEXIUM) 40 MG capsule  Iron deficiency anemia, unspecified iron deficiency anemia type - Plan: CBC with Differential/Platelet  Intermittent left-sided chest pain - Plan: EKG 12-Lead  Hyperlipidemia, unspecified hyperlipidemia type - Plan: Lipid panel  Depression, unspecified depression type  Vaccine counseling  Need for Tdap vaccination - Plan: Tdap vaccine greater than or equal to 7yo  IM  Need for vaccination against Streptococcus pneumoniae - Plan: Pneumococcal conjugate vaccine 13-valent  History of prediabetes - Plan: Hemoglobin A1c  She appears to be doing well.  Mammogram and DEXA along with pelvic exam done at OB/GYN.  GERD- daily chronic use of Nexium and Zantac. Advised her to cut back on Nexium use due to potential side effects.  Intermittent left-sided chest pain- appears to be MSK etiology. Has resolved.  Hyperlipidemia- statin intolerant. Check fasting lipids.  Depression- mild. Seems to be well controlled on current medications.  History of prediabetes- Hb A1c ordered ECG- NSR, rate normal, some p-wave abnormality.   She has fallen and this appears to be due to slick hardwood steps in her home. No sign of domestic abuse.  Counseling on all components of Tdap and Prevnar 13.  She will check with insurance regarding Shingrix. She knows to call ahead of time to  see if we have this.  Follow up pending labs.

## 2017-08-09 ENCOUNTER — Encounter: Payer: Self-pay | Admitting: Family Medicine

## 2017-08-09 DIAGNOSIS — R7303 Prediabetes: Secondary | ICD-10-CM | POA: Insufficient documentation

## 2017-08-09 HISTORY — DX: Prediabetes: R73.03

## 2017-08-09 LAB — TSH: TSH: 2.58 mIU/L (ref 0.40–4.50)

## 2017-08-09 LAB — CBC WITH DIFFERENTIAL/PLATELET
Basophils Absolute: 57 cells/uL (ref 0–200)
Basophils Relative: 0.8 %
Eosinophils Absolute: 192 cells/uL (ref 15–500)
Eosinophils Relative: 2.7 %
HCT: 32.4 % — ABNORMAL LOW (ref 35.0–45.0)
Hemoglobin: 10.9 g/dL — ABNORMAL LOW (ref 11.7–15.5)
Lymphs Abs: 2677 cells/uL (ref 850–3900)
MCH: 28.8 pg (ref 27.0–33.0)
MCHC: 33.6 g/dL (ref 32.0–36.0)
MCV: 85.7 fL (ref 80.0–100.0)
MPV: 10.5 fL (ref 7.5–12.5)
Monocytes Relative: 8 %
Neutro Abs: 3607 cells/uL (ref 1500–7800)
Neutrophils Relative %: 50.8 %
Platelets: 350 10*3/uL (ref 140–400)
RBC: 3.78 10*6/uL — ABNORMAL LOW (ref 3.80–5.10)
RDW: 13.7 % (ref 11.0–15.0)
Total Lymphocyte: 37.7 %
WBC mixed population: 568 cells/uL (ref 200–950)
WBC: 7.1 10*3/uL (ref 3.8–10.8)

## 2017-08-09 LAB — HEMOGLOBIN A1C
Hgb A1c MFr Bld: 5.7 % of total Hgb — ABNORMAL HIGH (ref <5.7)
Mean Plasma Glucose: 117 (calc)
eAG (mmol/L): 6.5 (calc)

## 2017-08-09 LAB — COMPREHENSIVE METABOLIC PANEL
AG Ratio: 1.7 (calc) (ref 1.0–2.5)
ALT: 15 U/L (ref 6–29)
AST: 19 U/L (ref 10–35)
Albumin: 4.3 g/dL (ref 3.6–5.1)
Alkaline phosphatase (APISO): 54 U/L (ref 33–130)
BUN: 13 mg/dL (ref 7–25)
CO2: 26 mmol/L (ref 20–32)
Calcium: 9.4 mg/dL (ref 8.6–10.4)
Chloride: 99 mmol/L (ref 98–110)
Creat: 0.99 mg/dL (ref 0.50–0.99)
Globulin: 2.5 g/dL (calc) (ref 1.9–3.7)
Glucose, Bld: 94 mg/dL (ref 65–99)
Potassium: 3.9 mmol/L (ref 3.5–5.3)
Sodium: 133 mmol/L — ABNORMAL LOW (ref 135–146)
Total Bilirubin: 0.4 mg/dL (ref 0.2–1.2)
Total Protein: 6.8 g/dL (ref 6.1–8.1)

## 2017-08-09 LAB — LIPID PANEL
Cholesterol: 299 mg/dL — ABNORMAL HIGH (ref <200)
HDL: 90 mg/dL (ref >50)
LDL Cholesterol (Calc): 183 mg/dL (calc) — ABNORMAL HIGH
Non-HDL Cholesterol (Calc): 209 mg/dL (calc) — ABNORMAL HIGH (ref <130)
Total CHOL/HDL Ratio: 3.3 (calc) (ref <5.0)
Triglycerides: 125 mg/dL (ref <150)

## 2017-08-14 MED FILL — BUPROPION HCL XL 150 MG TAB: 150 | 90 days supply | Qty: 180 | Fill #1

## 2017-08-14 MED FILL — CITALOPRAM HBR 20 MG TABLET: 20 | 90 days supply | Qty: 90 | Fill #1

## 2017-08-16 MED FILL — TOPIRAMATE 25 MG TAB: 25 | 30 days supply | Qty: 30 | Fill #0

## 2017-08-17 MED FILL — ESOMEPRAZOLE MAG DR 40 MG C: 40 | 30 days supply | Qty: 30 | Fill #0

## 2017-08-18 ENCOUNTER — Ambulatory Visit: Payer: 59 | Admitting: Family Medicine

## 2017-08-21 MED FILL — ESZOPICLONE 3 MG TABS: 3 | 30 days supply | Qty: 30 | Fill #3

## 2017-08-27 ENCOUNTER — Ambulatory Visit
Admission: RE | Admit: 2017-08-27 | Discharge: 2017-08-27 | Disposition: A | Discharge status: Home / Self Care | Payer: 59 | Source: Ambulatory Visit | Attending: Obstetrics & Gynecology | Admitting: Obstetrics & Gynecology

## 2017-08-27 DIAGNOSIS — E2839 Other primary ovarian failure: Secondary | ICD-10-CM

## 2017-08-27 DIAGNOSIS — Z1231 Encounter for screening mammogram for malignant neoplasm of breast: Secondary | ICD-10-CM | POA: Diagnosis not present

## 2017-08-27 DIAGNOSIS — M85851 Other specified disorders of bone density and structure, right thigh: Secondary | ICD-10-CM | POA: Diagnosis not present

## 2017-08-27 DIAGNOSIS — Z780 Asymptomatic menopausal state: Secondary | ICD-10-CM | POA: Diagnosis not present

## 2017-08-30 DIAGNOSIS — F332 Major depressive disorder, recurrent severe without psychotic features: Secondary | ICD-10-CM | POA: Diagnosis not present

## 2017-09-14 DIAGNOSIS — D2272 Melanocytic nevi of left lower limb, including hip: Secondary | ICD-10-CM | POA: Diagnosis not present

## 2017-09-14 DIAGNOSIS — D225 Melanocytic nevi of trunk: Secondary | ICD-10-CM | POA: Diagnosis not present

## 2017-09-14 DIAGNOSIS — D2371 Other benign neoplasm of skin of right lower limb, including hip: Secondary | ICD-10-CM | POA: Diagnosis not present

## 2017-09-14 DIAGNOSIS — D1801 Hemangioma of skin and subcutaneous tissue: Secondary | ICD-10-CM | POA: Diagnosis not present

## 2017-09-14 DIAGNOSIS — D2261 Melanocytic nevi of right upper limb, including shoulder: Secondary | ICD-10-CM | POA: Diagnosis not present

## 2017-09-14 DIAGNOSIS — L821 Other seborrheic keratosis: Secondary | ICD-10-CM | POA: Diagnosis not present

## 2017-09-14 DIAGNOSIS — L814 Other melanin hyperpigmentation: Secondary | ICD-10-CM | POA: Diagnosis not present

## 2017-09-14 MED FILL — ESOMEPRAZOLE MAG DR 40 MG C: 40 | 30 days supply | Qty: 30 | Fill #1

## 2017-09-14 MED FILL — TOPIRAMATE 50 MG TABLET: 50 | 30 days supply | Qty: 30 | Fill #0

## 2017-10-02 MED FILL — ESZOPICLONE 3 MG TABS: 3 | 30 days supply | Qty: 30 | Fill #4

## 2017-10-09 DIAGNOSIS — M170 Bilateral primary osteoarthritis of knee: Secondary | ICD-10-CM | POA: Diagnosis not present

## 2017-10-12 MED FILL — TOPIRAMATE 50 MG TABLET: 50 | 30 days supply | Qty: 30 | Fill #1

## 2017-11-06 ENCOUNTER — Other Ambulatory Visit: Payer: Self-pay | Admitting: Obstetrics & Gynecology

## 2017-11-07 NOTE — Telephone Encounter (Signed)
Medication refill request: Eszopiclone  Last AEX:  09-29-16  Next AEX: 01-11-18  Last MMG (if hormonal medication request): 08-28-17 WNL  Refill authorized: please advise

## 2017-11-09 ENCOUNTER — Other Ambulatory Visit: Payer: Self-pay | Admitting: Obstetrics & Gynecology

## 2017-11-09 MED FILL — ESZOPICLONE 3 MG TABS: 3 | 30 days supply | Qty: 30 | Fill #0

## 2017-11-09 NOTE — Telephone Encounter (Signed)
I just refilled this prescription this morning.  Can you check with pharmacy to see if needed.  Thanks.

## 2017-11-09 NOTE — Telephone Encounter (Signed)
Medication refill request: Eszopiclone  Last AEX:  09-29-16  Next AEX: 01-11-18  Last MMG (if hormonal medication request): 08-27-17 WNL  Refill authorized: please advise

## 2017-11-14 MED FILL — TOPIRAMATE 50 MG TABLET: 50 | 30 days supply | Qty: 30 | Fill #2

## 2017-11-14 MED FILL — buPROPion HCL ER (XL) 150 M: 150 | 90 days supply | Qty: 180 | Fill #0

## 2017-11-14 MED FILL — CITALOPRAM HBR 20 MG TABLET: 20 | 90 days supply | Qty: 90 | Fill #0

## 2017-11-14 MED FILL — ESOMEPRAZOLE MAG DR 40 MG C: 40 | 30 days supply | Qty: 30 | Fill #2

## 2017-12-11 MED FILL — ESZOPICLONE 3 MG TABLET: 3 | 30 days supply | Qty: 30 | Fill #1

## 2017-12-11 MED FILL — ESOMEPRAZOLE MAG DR 40 MG C: 40 | 30 days supply | Qty: 30 | Fill #3

## 2017-12-11 MED FILL — TOPIRAMATE 50 MG TABLET: 50 | 30 days supply | Qty: 30 | Fill #3

## 2017-12-17 DIAGNOSIS — H5203 Hypermetropia, bilateral: Secondary | ICD-10-CM | POA: Diagnosis not present

## 2017-12-18 DIAGNOSIS — F332 Major depressive disorder, recurrent severe without psychotic features: Secondary | ICD-10-CM | POA: Diagnosis not present

## 2018-01-01 ENCOUNTER — Encounter: Payer: Self-pay | Admitting: Family Medicine

## 2018-01-10 MED FILL — ESZOPICLONE 3 MG TABLET: 3 | 30 days supply | Qty: 30 | Fill #2

## 2018-01-10 MED FILL — ESOMEPRAZOLE MAG DR 40 MG C: 40 | 30 days supply | Qty: 30 | Fill #4

## 2018-01-11 ENCOUNTER — Ambulatory Visit: Payer: 59 | Admitting: Obstetrics & Gynecology

## 2018-01-11 ENCOUNTER — Other Ambulatory Visit: Payer: Self-pay

## 2018-01-11 ENCOUNTER — Encounter: Payer: Self-pay | Admitting: Obstetrics & Gynecology

## 2018-01-11 VITALS — BP 106/70 | HR 92 | Resp 16 | Ht 65.75 in | Wt 168.8 lb

## 2018-01-11 DIAGNOSIS — Z01419 Encounter for gynecological examination (general) (routine) without abnormal findings: Secondary | ICD-10-CM

## 2018-01-11 MED ORDER — VARENICLINE TARTRATE 0.5 MG X 11 & 1 MG X 42 PO MISC: ORAL | 0 refills | Status: DC

## 2018-01-11 MED ORDER — VITAMIN D (ERGOCALCIFEROL) 1.25 MG (50000 UNIT) PO CAPS: 50000.0000 [IU] | ORAL_CAPSULE | ORAL | 4 refills | Status: DC

## 2018-01-11 MED ORDER — ESZOPICLONE 3 MG PO TABS: 3.0000 mg | ORAL_TABLET | Freq: Every day | ORAL | 5 refills | Status: DC

## 2018-01-11 MED FILL — CHANTIX STARTING MONTH BOX: 0.5 MG X 11 | 30 days supply | Qty: 53 | Fill #0

## 2018-01-11 MED FILL — SHINGRIX 50 MCG SUS: 50 | 1 days supply | Qty: 1 | Fill #0

## 2018-01-11 MED FILL — VIT D2 1.25 MG (50,000 UNIT: 1.25 MG | 84 days supply | Qty: 6 | Fill #0

## 2018-01-11 NOTE — Progress Notes (Signed)
67 y.o. G18P2 DivorcedCaucasianF here for annual exam.  Has some desires to stop smoking.  Would like to try and stop smoking.  Would like to consider Chantix.  Has used nicotine gum and patches, acupuncture, and hypnotism.  None of this has helped.    Reports she has significantly decreased evening alcohol consumption.  Just can't seem to stop smoking.   Patient's last menstrual period was 08/07/1998.          Sexually active: No.  The current method of family planning is status post hysterectomy.    Exercising: Yes.    gym Smoker:  yes   Health Maintenance: Pap:  2009 History of abnormal Pap:  no MMG:  08/28/17 BIRADS1:Neg  Colonoscopy:  08/18/14 [place].  BMD:   08/27/17 Osteopenia  TDaP:  2019 Pneumonia vaccine(s):  2019 Shingrix:   No Hep C testing: 09/29/16 neg Screening Labs: PCP   reports that she has been smoking cigarettes.  She has been smoking about 0.50 packs per day. She has never used smokeless tobacco. She reports that she drinks about 2.4 oz of alcohol per week. She reports that she does not use drugs.  Past Medical History:  Diagnosis Date  . Anxiety   . Complication of anesthesia    past hx. laryngospasm following 2 past surgeries, not recent  . Depression   . GERD (gastroesophageal reflux disease)   . Hyperlipidemia   . Insomnia    periodically uses med.  . Perforated sigmoid colon ([place]) 04/28/14   during colonoscopy, "rupture colon"became septic, has colostomy and open surgical wound  . Prediabetes 08/09/2017  . Suicide attempt ([place])   . Vitamin D deficiency     Past Surgical History:  Procedure Laterality Date  . ABDOMINAL HYSTERECTOMY  1999   TAH/BSO  . APPLICATION OF WOUND VAC  04/28/2014   Procedure: APPLICATION OF WOUND VAC;  Surgeon: Rolm Bookbinder, MD;  Location: [place];  Service: General;;  . BLEPHAROPLASTY  2010  . BREAST SURGERY  2001   Breast reduction  . COLOSTOMY N/A 04/28/2014   Procedure: COLOSTOMY;  Surgeon: Rolm Bookbinder, MD;   Location: [place];  Service: General;  Laterality: N/A;  . COLOSTOMY REVISION N/A 04/28/2014   Procedure: COLON RESECTION SIGMOID;  Surgeon: Rolm Bookbinder, MD;  Location: [place];  Service: General;  Laterality: N/A;  . COLOSTOMY TAKEDOWN N/A 09/02/2014   Procedure: LAPAROSCOPIC COLOSTOMY REVERSAL;  Surgeon: [name], MD;  Location: [facility];  Service: General;  Laterality: N/A;  . COSMETIC SURGERY    . DEBRIDEMENT TENNIS ELBOW    . DILATION AND CURETTAGE OF UTERUS     x3, hysteroscopy  . ELBOW SURGERY  2007  . EYE SURGERY     Blepharoplasty  . LAPAROTOMY N/A 04/28/2014   Procedure: EXPLORATORY LAPAROTOMY,SIGMOID COLECTOMY;  Surgeon: Rolm Bookbinder, MD;  Location: [place];  Service: General;  Laterality: N/A;  . REDUCTION MAMMAPLASTY Bilateral   . scar and adhesion repair  06/01/15   and liposuction  . ULNAR TUNNEL RELEASE Left 2010    Current Outpatient Medications  Medication Sig Dispense Refill  . buPROPion (WELLBUTRIN XL) 150 MG 24 hr tablet   2  . citalopram (CELEXA) 20 MG tablet   2  . esomeprazole (NEXIUM) 40 MG capsule Take 1 capsule (40 mg total) by mouth daily before breakfast. 30 capsule 5  . Eszopiclone 3 MG TABS TAKE 1 TABLET BY MOUTH IMMEDIATELY BEFORE BEDTIME 30 tablet 5  . Ferrous Sulfate (SLOW FE PO) Take by  mouth daily.    Marland Kitchen ibuprofen (ADVIL,MOTRIN) 200 MG tablet Take 800 mg by mouth every 6 (six) hours as needed for mild pain or moderate pain.    . Ibuprofen-Famotidine (DUEXIS) 800-26.6 MG TABS Take 1 tablet by mouth daily.    Marland Kitchen topiramate (TOPAMAX) 50 MG tablet Take 1 tablet by mouth daily.     No current facility-administered medications for this visit.     Family History  Problem Relation Age of Onset  . Hyperlipidemia Father   . Hypertension Father   . Diabetes Father   . Cancer Father        History of bladder cancer  . Heart disease Father   . Other Father        4 ft intestinal removed-had gangrene in gallbladder, then spread to intestines  .  Hypertension Mother   . Diabetes Mother   . Heart disease Maternal Grandmother   . Stroke Maternal Grandfather   . Hyperlipidemia Brother   . Cancer Paternal Grandmother        uterine  . Cancer Paternal Grandfather        pancreatic cancer  . Esophageal cancer Sister     Review of Systems  Gastrointestinal: Positive for diarrhea, nausea and vomiting.  Musculoskeletal: Positive for joint pain.  Psychiatric/Behavioral: Positive for depression.  All other systems reviewed and are negative.   Exam:   BP 106/70 (BP Location: Left Arm, Patient Position: Sitting, Cuff Size: Large)   Pulse 92   Resp 16   Ht 5' 5.75" (1.67 m)   Wt 168 lb 12.8 oz (76.6 kg)   LMP 08/07/1998   BMI 27.45 kg/m    Height: 5' 5.75" (167 cm)  Ht Readings from Last 3 Encounters:  01/11/18 5' 5.75" (1.67 m)  08/08/17 5\' 6"  (1.676 m)  09/29/16 5' 5.75" (1.67 m)    General appearance: alert, cooperative and appears stated age Head: Normocephalic, without obvious abnormality, atraumatic Neck: no adenopathy, supple, symmetrical, trachea midline and thyroid normal to inspection and palpation Lungs: clear to auscultation bilaterally Breasts: normal appearance, no masses or tenderness Heart: regular rate and rhythm Abdomen: soft, non-tender; bowel sounds normal; no masses,  no organomegaly Extremities: extremities normal, atraumatic, no cyanosis or edema Skin: Skin color, texture, turgor normal. No rashes or lesions Lymph nodes: Cervical, supraclavicular, and axillary nodes normal. No abnormal inguinal nodes palpated Neurologic: Grossly normal   Pelvic: External genitalia:  no lesions              Urethra:  normal appearing urethra with no masses, tenderness or lesions              Bartholins and Skenes: normal                 Vagina: normal appearing vagina with normal color and discharge, no lesions              Cervix: absent              Pap taken: No. Bimanual Exam:  Uterus:  uterus absent               Adnexa: no mass, fullness, tenderness               Rectovaginal: Confirms               Anus:  normal sphincter tone, no lesions  Chaperone was present for exam.  A:  Well Woman with normal exam PMP, no HRT H/o TAH/SO 1999  H/O depression with prior suicide attempt Smoking, desires cessation H/O colon perforation that occurred with colonoscopy (9/15) with x-lap with colon resection and colostomy placement.  Colostomy reveral 1/16.  Then scar revision Vaginal atrophic changes HSV 1 Vit D deficiency   P:   Mammogram guidelines reviewed.  Doing 3D. pap smear not indicated Will start Chantix.  All side effects reviewed including depression and suicidal ideation.  Pt does have hx of attempted suicide and knows exactly how this felt for her.  She will monitor this closely and knows to stop immediately if having any symptoms.   Vit D 50K every two weeks. RF for Lunesta 3mg  nightly prn.  #30/5RF. Lab work done in January, 2019 Information about Shingrix vaccination given Declines future colonoscopy Return annually or prn

## 2018-01-23 DIAGNOSIS — F332 Major depressive disorder, recurrent severe without psychotic features: Secondary | ICD-10-CM | POA: Diagnosis not present

## 2018-01-24 MED FILL — TOPIRAMATE 50 MG TABLET: 50 | 30 days supply | Qty: 30 | Fill #0

## 2018-02-12 MED FILL — ESOMEPRAZOLE MAG DR 40 MG C: 40 | 30 days supply | Qty: 30 | Fill #5

## 2018-02-12 MED FILL — ESZOPICLONE 3 MG TABS: 3 | 30 days supply | Qty: 30 | Fill #3

## 2018-02-12 MED FILL — CITALOPRAM HBR 20 MG TABLET: 20 | 90 days supply | Qty: 90 | Fill #1

## 2018-02-14 ENCOUNTER — Other Ambulatory Visit: Payer: Self-pay | Admitting: Obstetrics & Gynecology

## 2018-02-14 NOTE — Telephone Encounter (Signed)
Medication refill request: Chantix  0.5 Mg X11 & 1MG  X42 Last AEX:  01/11/18 Next AEX: 04/25/19 Last MMG (if hormonal medication request): 08/27/17 Bi-rads Category 1 Neg  Refill authorized:Please advise

## 2018-02-18 MED ORDER — VARENICLINE TARTRATE 0.5 MG X 11 & 1 MG X 42 PO MISC: 1.0000 mg | Freq: Two times a day (BID) | ORAL | 1 refills | Status: DC

## 2018-02-18 MED FILL — CHANTIX 1 MG TABLET: 1 | 30 days supply | Qty: 60 | Fill #0

## 2018-02-18 NOTE — Telephone Encounter (Signed)
Rx faxed to pharmacy  

## 2018-02-26 MED FILL — TOPIRAMATE 50 MG TABLET: 50 | 30 days supply | Qty: 30 | Fill #1

## 2018-02-27 MED FILL — buPROPion HCL ER (XL) 150 M: 150 | 90 days supply | Qty: 180 | Fill #0

## 2018-03-11 ENCOUNTER — Other Ambulatory Visit: Payer: Self-pay | Admitting: Family Medicine

## 2018-03-11 DIAGNOSIS — K219 Gastro-esophageal reflux disease without esophagitis: Secondary | ICD-10-CM

## 2018-03-11 MED FILL — ESOMEPRAZOLE MAG DR 40 MG C: 40 | 30 days supply | Qty: 30 | Fill #0

## 2018-03-14 MED FILL — ESZOPICLONE 3 MG TABS: 3 | 30 days supply | Qty: 30 | Fill #4

## 2018-03-25 MED FILL — SHINGRIX 50 MCG SUS: 50 | 1 days supply | Qty: 1 | Fill #1

## 2018-03-28 MED FILL — TOPIRAMATE 50 MG TABLET: 50 | 30 days supply | Qty: 30 | Fill #2

## 2018-03-28 MED FILL — VIT D2 1.25 MG (50,000 UNIT: 1.25 MG | 84 days supply | Qty: 6 | Fill #1

## 2018-04-16 MED FILL — ESOMEPRAZOLE MAG DR 40 MG C: 40 | 30 days supply | Qty: 30 | Fill #1

## 2018-04-18 DIAGNOSIS — F332 Major depressive disorder, recurrent severe without psychotic features: Secondary | ICD-10-CM | POA: Diagnosis not present

## 2018-04-29 MED FILL — ESZOPICLONE 3 MG TABS: 3 | 30 days supply | Qty: 30 | Fill #5

## 2018-04-29 MED FILL — TOPIRAMATE 50 MG TABLET: 50 | 30 days supply | Qty: 30 | Fill #3

## 2018-05-16 MED FILL — ESOMEPRAZOLE MAG DR 40 MG C: 40 | 30 days supply | Qty: 30 | Fill #2

## 2018-06-02 ENCOUNTER — Encounter (HOSPITAL_COMMUNITY): Payer: Self-pay | Admitting: *Deleted

## 2018-06-02 ENCOUNTER — Ambulatory Visit (HOSPITAL_COMMUNITY)
Admission: EM | Admit: 2018-06-02 | Discharge: 2018-06-02 | Disposition: A | Discharge status: Home / Self Care | Payer: 59 | Attending: Family Medicine | Admitting: Family Medicine

## 2018-06-02 DIAGNOSIS — W5501XA Bitten by cat, initial encounter: Secondary | ICD-10-CM | POA: Diagnosis not present

## 2018-06-02 DIAGNOSIS — Z23 Encounter for immunization: Secondary | ICD-10-CM

## 2018-06-02 DIAGNOSIS — R2231 Localized swelling, mass and lump, right upper limb: Secondary | ICD-10-CM | POA: Diagnosis not present

## 2018-06-02 DIAGNOSIS — S61451A Open bite of right hand, initial encounter: Secondary | ICD-10-CM | POA: Diagnosis not present

## 2018-06-02 MED ORDER — TETANUS-DIPHTH-ACELL PERTUSSIS 5-2.5-18.5 LF-MCG/0.5 IM SUSP
0.5000 mL | Freq: Once | INTRAMUSCULAR | Status: AC
Start: 2018-06-02 — End: 2018-06-02
  Administered 2018-06-02: 0.5 mL via INTRAMUSCULAR

## 2018-06-02 MED ORDER — TETANUS-DIPHTH-ACELL PERTUSSIS 5-2.5-18.5 LF-MCG/0.5 IM SUSP
INTRAMUSCULAR | Status: AC
  Filled 2018-06-02: qty 0.5

## 2018-06-02 MED ORDER — HYDROCODONE-ACETAMINOPHEN 5-325 MG PO TABS: 1.0000 | ORAL_TABLET | Freq: Four times a day (QID) | ORAL | 0 refills | Status: DC | PRN

## 2018-06-02 MED ORDER — AMOXICILLIN-POT CLAVULANATE 875-125 MG PO TABS: 1.0000 | ORAL_TABLET | Freq: Two times a day (BID) | ORAL | 0 refills | Status: DC

## 2018-06-02 NOTE — Discharge Instructions (Addendum)
Tetanus updated today. Start Augmentin as directed for cat bite.  Ibuprofen 800 mg 3 times a day for pain.  Norco for breakthrough pain.  Monitor for spreading redness, tracking going further, fever, go to the emergency department for further evaluation.  Otherwise follow-up with PCP/orthopedics for further evaluation and monitoring needed.

## 2018-06-02 NOTE — ED Provider Notes (Signed)
[facility]    CSN: [id number] Arrival date & time: 06/02/18  1109     History   Chief Complaint Chief Complaint  Patient presents with  . Animal Bite    HPI [name] is a 67 y.o. female.   67 year old female comes in for cat bite to the left hand last night.  Patient knows the owner, and cat is up-to-date on immunizations.  Woke up with left hand swelling, erythema, warmth and streaking.  Denies fever, chills, night sweats.  States after sustaining the bite, wash the area and applied ice compress.  Last Tetanus 10 years ago.     Past Medical History:  Diagnosis Date  . Anxiety   . Complication of anesthesia    past hx. laryngospasm following 2 past surgeries, not recent  . Depression   . GERD (gastroesophageal reflux disease)   . Hyperlipidemia   . Insomnia    periodically uses med.  . Perforated sigmoid colon ([place]) 04/28/14   during colonoscopy, "rupture colon"became septic, has colostomy and open surgical wound  . Prediabetes 08/09/2017  . Suicide attempt ([place])   . Vitamin D deficiency     Patient Active Problem List   Diagnosis Date Noted  . Prediabetes 08/09/2017  . Iron deficiency anemia 08/08/2017  . S/P colectomy 04/28/2014  . Hyperlipidemia 07/21/2010  . DEPRESSION 07/21/2010  . GERD 07/21/2010  . PNEUMONIA, HX OF 07/21/2010    Past Surgical History:  Procedure Laterality Date  . ABDOMINAL HYSTERECTOMY  1999   TAH/BSO  . APPLICATION OF WOUND VAC  04/28/2014   Procedure: APPLICATION OF WOUND VAC;  Surgeon: Rolm Bookbinder, MD;  Location: [place];  Service: General;;  . BLEPHAROPLASTY  2010  . BREAST SURGERY  2001   Breast reduction  . COLOSTOMY N/A 04/28/2014   Procedure: COLOSTOMY;  Surgeon: Rolm Bookbinder, MD;  Location: [place];  Service: General;  Laterality: N/A;  . COLOSTOMY REVISION N/A 04/28/2014   Procedure: COLON RESECTION SIGMOID;  Surgeon: Rolm Bookbinder, MD;  Location: [place];  Service: General;  Laterality: N/A;    . COLOSTOMY TAKEDOWN N/A 09/02/2014   Procedure: LAPAROSCOPIC COLOSTOMY REVERSAL;  Surgeon: [name], MD;  [facility];  Service: General;  Laterality: N/A;  . COSMETIC SURGERY    . DEBRIDEMENT TENNIS ELBOW    . DILATION AND CURETTAGE OF UTERUS     x3, hysteroscopy  . ELBOW SURGERY  2007  . EYE SURGERY     Blepharoplasty  . LAPAROTOMY N/A 04/28/2014   Procedure: EXPLORATORY LAPAROTOMY,SIGMOID COLECTOMY;  Surgeon: Rolm Bookbinder, MD;  Location: [place];  Service: General;  Laterality: N/A;  . REDUCTION MAMMAPLASTY Bilateral   . scar and adhesion repair  06/01/15   and liposuction  . ULNAR TUNNEL RELEASE Left 2010    OB History    Gravida  7   Para  2   Term      Preterm      AB      Living  2     SAB      TAB      Ectopic      Multiple      Live Births               Home Medications    Prior to Admission medications   Medication Sig Start Date End Date Taking? Authorizing Provider  buPROPion (WELLBUTRIN XL) 150 MG 24 hr tablet  09/13/16  Yes [provider]  citalopram (CELEXA) 20  MG tablet  08/28/16  Yes [provider]  esomeprazole (NEXIUM) 40 MG capsule TAKE 1 CAPSULE BY MOUTH ONCE DAILY BEFORE BREAKFAST 03/11/18  Yes Henson, Vickie L, NP-C  Eszopiclone 3 MG TABS Take 1 tablet (3 mg total) by mouth at bedtime. Take immediately before bedtime 01/11/18  Yes Megan Salon, MD  Ferrous Sulfate (SLOW FE PO) Take by mouth daily.   Yes [provider]  topiramate (TOPAMAX) 50 MG tablet Take 1 tablet by mouth daily.   Yes [provider]  Vitamin D, Ergocalciferol, (DRISDOL) 50000 units CAPS capsule Take 1 capsule (50,000 Units total) by mouth every 14 (fourteen) days. 01/11/18  Yes Megan Salon, MD  amoxicillin-clavulanate (AUGMENTIN) 875-125 MG tablet Take 1 tablet by mouth every 12 (twelve) hours. 06/02/18   Tasia Catchings, Jeanny Rymer V, PA-C  HYDROcodone-acetaminophen (NORCO/VICODIN) 5-325 MG tablet Take 1 tablet by mouth every 6 (six)  hours as needed for severe pain. 06/02/18   Tasia Catchings, Lissete Maestas V, PA-C  ibuprofen (ADVIL,MOTRIN) 200 MG tablet Take 800 mg by mouth every 6 (six) hours as needed for mild pain or moderate pain.    [provider]  Ibuprofen-Famotidine (DUEXIS) 800-26.6 MG TABS Take 1 tablet by mouth daily.    [provider]    Family History Family History  Problem Relation Age of Onset  . Hyperlipidemia Father   . Hypertension Father   . Diabetes Father   . Cancer Father        History of bladder cancer  . Heart disease Father   . Other Father        4 ft intestinal removed-had gangrene in gallbladder, then spread to intestines  . Hypertension Mother   . Diabetes Mother   . Heart disease Maternal Grandmother   . Stroke Maternal Grandfather   . Hyperlipidemia Brother   . Cancer Paternal Grandmother        uterine  . Cancer Paternal Grandfather        pancreatic cancer  . Esophageal cancer Sister     Social History Social History   Tobacco Use  . Smoking status: Former Smoker    Packs/day: 0.50    Types: Cigarettes    Last attempt to quit: 09/01/2003    Years since quitting: 14.7  . Smokeless tobacco: Never Used  . Tobacco comment: occasional  Substance Use Topics  . Alcohol use: Yes    Alcohol/week: 4.0 standard drinks    Types: 4 Standard drinks or equivalent per week    Comment: 2-4 glasses of wine most nights   . Drug use: No     Allergies   Bee venom and Statins   Review of Systems Review of Systems  Reason unable to perform ROS: See HPI as above.     Physical Exam Triage Vital Signs ED Triage Vitals  Enc Vitals Group     BP 06/02/18 1152 134/81     Pulse Rate 06/02/18 1151 90     Resp 06/02/18 1151 16     Temp 06/02/18 1151 98.2 F (36.8 C)     Temp Source 06/02/18 1151 Oral     SpO2 06/02/18 1151 99 %     Weight --      Height --      Head Circumference --      Peak Flow --      Pain Score 06/02/18 1152 5     Pain Loc --      Pain Edu? --  Excl. in GC? --    No data found.  Updated Vital Signs BP 134/81   Pulse 90   Temp 98.2 F (36.8 C) (Oral)   Resp 16   LMP 08/07/1998   SpO2 99%    Physical Exam  Constitutional: She is oriented to person, place, and time. She appears well-developed and well-nourished. No distress.  HENT:  Head: Normocephalic and atraumatic.  Eyes: Pupils are equal, round, and reactive to light. Conjunctivae are normal.  Musculoskeletal:  See picture below.  2 puncture wounds to the dorsal and ventral aspect of the left hand.  Surrounding swelling, erythema, warmth with streaking up to mid forearm.  Tenderness to palpation to the area.  Full range of motion.  Strength deferred.  Sensation intact, with 4 out of 5 sensation to 1-3 fingers.  Radial pulse 2+, cap refill less than 2 seconds.  Neurological: She is alert and oriented to person, place, and time.  Skin: She is not diaphoretic.         UC Treatments / Results  Labs (all labs ordered are listed, but only abnormal results are displayed) Labs Reviewed - No data to display  EKG None  Radiology No results found.  Procedures Procedures (including critical care time)  Medications Ordered in UC Medications  Tdap (BOOSTRIX) injection 0.5 mL (0.5 mLs Intramuscular Given 06/02/18 1259)    Initial Impression / Assessment and Plan / UC Course  I have reviewed the triage vital signs and the nursing notes.  Pertinent labs & imaging results that were available during my care of the patient were reviewed by me and considered in my medical decision making (see chart for details).    Tetanus updated.  Augmentin as directed.  Wound care instructions given.  Return precautions given.  Patient expresses understanding and agrees to plan.  Final Clinical Impressions(s) / UC Diagnoses   Final diagnoses:  Cat bite, initial encounter    ED Prescriptions    Medication Sig Dispense Auth. Provider   amoxicillin-clavulanate (AUGMENTIN)  875-125 MG tablet Take 1 tablet by mouth every 12 (twelve) hours. 14 tablet Anniebell Bedore V, PA-C   HYDROcodone-acetaminophen (NORCO/VICODIN) 5-325 MG tablet Take 1 tablet by mouth every 6 (six) hours as needed for severe pain. 10 tablet Ok Edwards, PA-C     Controlled Substance Prescriptions [place] Controlled Substance Registry consulted? Yes, I have consulted the  Controlled Substances Registry for this patient, and feel the risk/benefit ratio today is favorable for proceeding with this prescription for a controlled substance.   Ok Edwards, PA-C 06/02/18 1340

## 2018-06-02 NOTE — ED Triage Notes (Signed)
Reports cat bite to left hand last night; pt knows owner, cat is up top date on immunizations.  Left hand swollen with 2 puncture wound scabs.  Denies fevers.

## 2018-06-03 ENCOUNTER — Ambulatory Visit: Payer: 59 | Admitting: Family Medicine

## 2018-06-03 ENCOUNTER — Encounter: Payer: Self-pay | Admitting: Family Medicine

## 2018-06-03 VITALS — BP 126/78 | HR 80 | Temp 98.3°F | Wt 169.4 lb

## 2018-06-03 DIAGNOSIS — S61452A Open bite of left hand, initial encounter: Secondary | ICD-10-CM | POA: Diagnosis not present

## 2018-06-03 DIAGNOSIS — L03114 Cellulitis of left upper limb: Secondary | ICD-10-CM

## 2018-06-03 DIAGNOSIS — W5501XA Bitten by cat, initial encounter: Secondary | ICD-10-CM

## 2018-06-03 MED ORDER — CEFTRIAXONE SODIUM 250 MG IJ SOLR
250.0000 mg | Freq: Once | INTRAMUSCULAR | Status: AC
  Administered 2018-06-03: 250 mg via INTRAMUSCULAR

## 2018-06-03 NOTE — Progress Notes (Signed)
   Subjective:    Patient ID: [name], female    DOB: [birth year], 67 y.o.   MRN: [id number]  HPI She sustained a cat bite to the dorsal aspect of her thumb several days ago and she was seen in an urgent care several and given Augmentin yesterday.  She has had 3 doses of it.   Review of Systems     Objective:   Physical Exam Alert and in no distress.  2 puncture wounds were noted on the left hand near the thumb.  There is surrounding erythema that was well demarcated.  It was marked with an ink pen and a picture was taken. She would like a shot of Rocephin which in the past is apparently helped with a similar issue.      Assessment & Plan:  Cellulitis of left upper extremity - Plan: cefTRIAXone (ROCEPHIN) injection 250 mg  Cat bite of hand, left, initial encounter - Plan: cefTRIAXone (ROCEPHIN) injection 250 mg She will continue on her Augmentin.  I will check her again tomorrow.  Explained that since its on the dorsal surface there is a little less worry than if it was on the palmar surface.

## 2018-06-04 ENCOUNTER — Encounter: Payer: Self-pay | Admitting: Family Medicine

## 2018-06-04 ENCOUNTER — Ambulatory Visit: Payer: 59 | Admitting: Family Medicine

## 2018-06-04 VITALS — BP 126/82 | HR 100 | Temp 98.2°F | Wt 168.4 lb

## 2018-06-04 DIAGNOSIS — W5501XA Bitten by cat, initial encounter: Secondary | ICD-10-CM | POA: Diagnosis not present

## 2018-06-04 DIAGNOSIS — S61452A Open bite of left hand, initial encounter: Secondary | ICD-10-CM

## 2018-06-04 DIAGNOSIS — L03114 Cellulitis of left upper limb: Secondary | ICD-10-CM

## 2018-06-04 MED ORDER — CEFTRIAXONE SODIUM 250 MG IJ SOLR
250.0000 mg | Freq: Once | INTRAMUSCULAR | Status: AC
  Administered 2018-06-04: 250 mg via INTRAMUSCULAR

## 2018-06-04 NOTE — Progress Notes (Signed)
   Subjective:    Patient ID: [name], female    DOB: 01-21-1951, 67 y.o.   MRN: [id number]  HPI She is here for recheck.  She is still having some thumb pain but overall seems to be doing better.   Review of Systems     Objective:   Physical Exam Alert and in no distress.  Exam of the left thumb and wrist area does show much less erythema and swelling.  Tender to palpation over the dorsum of the MCP joint however the palmar surface was nontender.       Assessment & Plan:  Cellulitis of left upper extremity - Plan: cefTRIAXone (ROCEPHIN) injection 250 mg  Cat bite of hand, left, initial encounter I explained that she seems to be doing much better but will give her another shot.  As long as this continues to improve, no further intervention necessary.  I explained that I did not think that the joint was involved.

## 2018-06-05 ENCOUNTER — Other Ambulatory Visit: Payer: Self-pay | Admitting: Obstetrics & Gynecology

## 2018-06-05 MED FILL — ESZOPICLONE 3 MG TABS: 3 | 30 days supply | Qty: 30 | Fill #0

## 2018-06-05 NOTE — Telephone Encounter (Signed)
Medication refill request: Eszopiclone Last AEX:  01/11/18 Next AEX: 04/25/19 Last MMG (if hormonal medication request): 08/28/17  Bi-rads 1 Neg  Refill authorized:  #[id number] with 5 RF Please refill if appropriate.

## 2018-06-11 MED FILL — CITALOPRAM HBR 20 MG TABLET: 20 | 90 days supply | Qty: 90 | Fill #0

## 2018-06-11 MED FILL — buPROPion HCL ER (XL) 150 M: 150 | 90 days supply | Qty: 180 | Fill #1

## 2018-06-11 MED FILL — TOPIRAMATE 50 MG TABLET: 50 | 30 days supply | Qty: 30 | Fill #0

## 2018-06-14 ENCOUNTER — Telehealth: Payer: Self-pay | Admitting: Family Medicine

## 2018-06-14 MED ORDER — AMOXICILLIN-POT CLAVULANATE 875-125 MG PO TABS: 1.0000 | ORAL_TABLET | Freq: Two times a day (BID) | ORAL | 0 refills | Status: DC

## 2018-06-14 MED FILL — AMOX-CLAV 875-125 MG TABLET: 875-125 | 7 days supply | Qty: 14 | Fill #0

## 2018-06-14 NOTE — Telephone Encounter (Signed)
I called it into [facility]

## 2018-06-14 NOTE — Telephone Encounter (Signed)
Pt called and stated that she is about 80% better but will feeling better if placed on a antibiotic. She is requested medication be sent to [facility] on [address]. Pt can be reached at [phone].

## 2018-06-14 NOTE — Telephone Encounter (Signed)
Patient informed. 

## 2018-06-20 ENCOUNTER — Other Ambulatory Visit: Payer: Self-pay | Admitting: Family Medicine

## 2018-06-20 DIAGNOSIS — K219 Gastro-esophageal reflux disease without esophagitis: Secondary | ICD-10-CM

## 2018-06-20 DIAGNOSIS — M79642 Pain in left hand: Secondary | ICD-10-CM | POA: Diagnosis not present

## 2018-06-20 DIAGNOSIS — W5501XA Bitten by cat, initial encounter: Secondary | ICD-10-CM | POA: Diagnosis not present

## 2018-07-02 MED FILL — ESZOPICLONE 3 MG TABS: 3 | 30 days supply | Qty: 30 | Fill #1

## 2018-07-02 MED FILL — ESOMEPRAZOLE MAG DR 40 MG C: 40 | 30 days supply | Qty: 30 | Fill #0

## 2018-08-05 MED FILL — ESOMEPRAZOLE MAG DR 40 MG C: 40 | 30 days supply | Qty: 30 | Fill #1

## 2018-08-15 DIAGNOSIS — F332 Major depressive disorder, recurrent severe without psychotic features: Secondary | ICD-10-CM | POA: Diagnosis not present

## 2018-08-27 ENCOUNTER — Other Ambulatory Visit: Payer: Self-pay | Admitting: Family Medicine

## 2018-08-27 DIAGNOSIS — K219 Gastro-esophageal reflux disease without esophagitis: Secondary | ICD-10-CM

## 2018-08-27 MED FILL — ESZOPICLONE 3 MG TABS: 3 | 30 days supply | Qty: 30 | Fill #2

## 2018-08-27 MED FILL — TOPIRAMATE 50 MG TABLET: 50 | 30 days supply | Qty: 30 | Fill #1

## 2018-08-29 MED FILL — ESOMEPRAZOLE MAG DR 40 MG C: 40 | 30 days supply | Qty: 30 | Fill #0

## 2018-10-04 MED FILL — CITALOPRAM HBR 20 MG TABLET: 20 | 90 days supply | Qty: 90 | Fill #1

## 2018-10-04 MED FILL — ESZOPICLONE 3 MG TABS: 3 | 30 days supply | Qty: 30 | Fill #3

## 2018-10-04 MED FILL — ESOMEPRAZOLE MAG DR 40 MG C: 40 | 30 days supply | Qty: 30 | Fill #1

## 2018-10-07 MED FILL — buPROPion HCL ER (XL) 150 M: 150 | 90 days supply | Qty: 180 | Fill #0

## 2018-10-10 ENCOUNTER — Other Ambulatory Visit: Payer: Self-pay | Admitting: Obstetrics & Gynecology

## 2018-10-10 DIAGNOSIS — Z1231 Encounter for screening mammogram for malignant neoplasm of breast: Secondary | ICD-10-CM

## 2018-10-18 DIAGNOSIS — D2261 Melanocytic nevi of right upper limb, including shoulder: Secondary | ICD-10-CM | POA: Diagnosis not present

## 2018-10-18 DIAGNOSIS — D225 Melanocytic nevi of trunk: Secondary | ICD-10-CM | POA: Diagnosis not present

## 2018-10-18 DIAGNOSIS — L720 Epidermal cyst: Secondary | ICD-10-CM | POA: Diagnosis not present

## 2018-10-18 DIAGNOSIS — D2271 Melanocytic nevi of right lower limb, including hip: Secondary | ICD-10-CM | POA: Diagnosis not present

## 2018-10-18 DIAGNOSIS — D2262 Melanocytic nevi of left upper limb, including shoulder: Secondary | ICD-10-CM | POA: Diagnosis not present

## 2018-10-18 DIAGNOSIS — L821 Other seborrheic keratosis: Secondary | ICD-10-CM | POA: Diagnosis not present

## 2018-10-18 DIAGNOSIS — D1801 Hemangioma of skin and subcutaneous tissue: Secondary | ICD-10-CM | POA: Diagnosis not present

## 2018-11-08 ENCOUNTER — Ambulatory Visit: Payer: 59

## 2018-11-28 DIAGNOSIS — F332 Major depressive disorder, recurrent severe without psychotic features: Secondary | ICD-10-CM | POA: Diagnosis not present

## 2018-12-20 DIAGNOSIS — H5203 Hypermetropia, bilateral: Secondary | ICD-10-CM | POA: Diagnosis not present

## 2019-01-01 ENCOUNTER — Other Ambulatory Visit: Payer: Self-pay | Admitting: Obstetrics & Gynecology

## 2019-01-01 NOTE — Telephone Encounter (Signed)
Medication refill request: eszopiclone 3mg  Last AEX:  01/11/18 Next AEX: 04-25-2019 Last MMG (if hormonal medication request): n/a Refill authorized: please approve if appropriate

## 2019-01-03 MED FILL — ESZOPICLONE 3 MG TABS: 3 | 30 days supply | Qty: 30 | Fill #0

## 2019-02-28 ENCOUNTER — Encounter: Payer: 59 | Admitting: Family Medicine

## 2019-03-17 MED FILL — ESZOPICLONE 3 MG TABS: 3 | 30 days supply | Qty: 30 | Fill #1

## 2019-04-03 MED FILL — CITALOPRAM HBR 20 MG TABLET: 20 | 30 days supply | Qty: 30 | Fill #0

## 2019-04-03 MED FILL — buPROPion HCL ER (XL) 150 M: 150 | 30 days supply | Qty: 60 | Fill #0

## 2019-04-03 MED FILL — TOPIRAMATE 50 MG TABLET: 50 | 30 days supply | Qty: 30 | Fill #0

## 2019-04-04 ENCOUNTER — Ambulatory Visit
Admission: RE | Admit: 2019-04-04 | Discharge: 2019-04-04 | Disposition: A | Discharge status: Home / Self Care | Payer: 59 | Source: Ambulatory Visit | Attending: Obstetrics & Gynecology | Admitting: Obstetrics & Gynecology

## 2019-04-04 ENCOUNTER — Other Ambulatory Visit: Payer: Self-pay

## 2019-04-04 DIAGNOSIS — Z1231 Encounter for screening mammogram for malignant neoplasm of breast: Secondary | ICD-10-CM | POA: Diagnosis not present

## 2019-04-11 ENCOUNTER — Encounter: Payer: 59 | Admitting: Family Medicine

## 2019-04-21 ENCOUNTER — Other Ambulatory Visit: Payer: Self-pay

## 2019-04-21 ENCOUNTER — Ambulatory Visit: Payer: 59 | Admitting: Family Medicine

## 2019-04-21 ENCOUNTER — Encounter: Payer: Self-pay | Admitting: Family Medicine

## 2019-04-21 VITALS — BP 112/72 | HR 104 | Temp 97.8°F | Ht 65.5 in | Wt 166.0 lb

## 2019-04-21 DIAGNOSIS — Z0000 Encounter for general adult medical examination without abnormal findings: Secondary | ICD-10-CM | POA: Diagnosis not present

## 2019-04-21 DIAGNOSIS — Z23 Encounter for immunization: Secondary | ICD-10-CM

## 2019-04-21 DIAGNOSIS — D509 Iron deficiency anemia, unspecified: Secondary | ICD-10-CM

## 2019-04-21 DIAGNOSIS — K219 Gastro-esophageal reflux disease without esophagitis: Secondary | ICD-10-CM

## 2019-04-21 DIAGNOSIS — E785 Hyperlipidemia, unspecified: Secondary | ICD-10-CM | POA: Diagnosis not present

## 2019-04-21 DIAGNOSIS — F32A Depression, unspecified: Secondary | ICD-10-CM

## 2019-04-21 DIAGNOSIS — F329 Major depressive disorder, single episode, unspecified: Secondary | ICD-10-CM | POA: Diagnosis not present

## 2019-04-21 DIAGNOSIS — R0609 Other forms of dyspnea: Secondary | ICD-10-CM | POA: Diagnosis not present

## 2019-04-21 DIAGNOSIS — Z8639 Personal history of other endocrine, nutritional and metabolic disease: Secondary | ICD-10-CM | POA: Diagnosis not present

## 2019-04-21 DIAGNOSIS — R7303 Prediabetes: Secondary | ICD-10-CM

## 2019-04-21 DIAGNOSIS — R0600 Dyspnea, unspecified: Secondary | ICD-10-CM

## 2019-04-21 DIAGNOSIS — Z9049 Acquired absence of other specified parts of digestive tract: Secondary | ICD-10-CM

## 2019-04-21 NOTE — Progress Notes (Signed)
   Subjective:    Patient ID: [name], female    DOB: 1950/08/27, 68 y.o.   MRN: [id number]  HPI She is here for complete examination.  She does see her gynecologist on a yearly basis.  Apparently she gets sleep meds from her gynecologist and uses them once or twice per week.  She also has an underlying mood disorder and is being seen for this.  Presently she is on Vraylar, Topamax, Celexa and Wellbutrin.  Seems to be fairly stable on this.  She also has a previous history of vitamin D deficiency.  She does have reflux and uses Nexium.  She does have a previous history of iron deficiency anemia as well as prediabetes and hyperlipidemia.  She has a previous history of colectomy after perforated colon following colonoscopy.  At the end of the encounter she then mentions difficulty with dyspnea on exertion has been going on since April but no associated chest pain, diaphoresis, PND.  Apparently father had heart disease in his 77s.  She blames this on deconditioning.  She works as a Mining engineer.  She is vaping and at this time is not interested in quitting.  Family and social history as well as health maintenance immunizations was reviewed.  She is divorced.    Review of Systems  All other systems reviewed and are negative.      Objective:   Physical Exam Alert and in no distress. Tympanic membranes and canals are normal. Pharyngeal area is normal. Neck is supple without adenopathy or thyromegaly. Cardiac exam shows a regular sinus rhythm without murmurs or gallops. Lungs are clear to auscultation. Abdominal exam shows no masses or tenderness. EKG shows no acute changes.       Assessment & Plan:  Routine general medical examination at a health care facility - Plan: CBC with Differential/Platelet, Comprehensive metabolic panel, Lipid panel  Iron deficiency anemia, unspecified iron deficiency anemia type  Hyperlipidemia, unspecified hyperlipidemia type  Gastroesophageal reflux  disease, esophagitis presence not specified  Prediabetes  DOE (dyspnea on exertion) - Plan: EKG 12-Lead  History of vitamin D deficiency - Plan: VITAMIN D 25 Hydroxy (Vit-D Deficiency, Fractures)  Need for vaccination against Streptococcus pneumoniae - Plan: Pneumococcal polysaccharide vaccine 23-valent greater than or equal to 2yo subcutaneous/IM  Depression, controlled  S/P colectomy  Follow-up pending her blood work.  Recommend she see if she can go every other day or even every third day with her PPI. She will get her flu shot at the hospital. Discussed the dyspnea on exertion with her.  She would like to try getting herself in better shape to see if this would help.  I explained that it certainly reasonable to see cardiology but she would like to hold off on it at this time.  I explained that if she does get worse, she is to let me know and we will get her in for further evaluation.  She was comfortable with that. I also discussed her drinking habits and encouraged her to have 1 alcoholic beverage per day.

## 2019-04-22 ENCOUNTER — Encounter: Payer: Self-pay | Admitting: Family Medicine

## 2019-04-22 DIAGNOSIS — E559 Vitamin D deficiency, unspecified: Secondary | ICD-10-CM

## 2019-04-22 DIAGNOSIS — E785 Hyperlipidemia, unspecified: Secondary | ICD-10-CM

## 2019-04-22 LAB — CBC WITH DIFFERENTIAL/PLATELET
Basophils Absolute: 0 10*3/uL (ref 0.0–0.2)
Basos: 1 %
EOS (ABSOLUTE): 0.3 10*3/uL (ref 0.0–0.4)
Eos: 4 %
Hematocrit: 31.7 % — ABNORMAL LOW (ref 34.0–46.6)
Hemoglobin: 9.9 g/dL — ABNORMAL LOW (ref 11.1–15.9)
Immature Grans (Abs): 0 10*3/uL (ref 0.0–0.1)
Immature Granulocytes: 0 %
Lymphocytes Absolute: 2.3 10*3/uL (ref 0.7–3.1)
Lymphs: 39 %
MCH: 24 pg — ABNORMAL LOW (ref 26.6–33.0)
MCHC: 31.2 g/dL — ABNORMAL LOW (ref 31.5–35.7)
MCV: 77 fL — ABNORMAL LOW (ref 79–97)
Monocytes Absolute: 0.5 10*3/uL (ref 0.1–0.9)
Monocytes: 8 %
Neutrophils Absolute: 2.8 10*3/uL (ref 1.4–7.0)
Neutrophils: 48 %
Platelets: 449 10*3/uL (ref 150–450)
RBC: 4.13 x10E6/uL (ref 3.77–5.28)
RDW: 15.4 % (ref 11.7–15.4)
WBC: 5.9 10*3/uL (ref 3.4–10.8)

## 2019-04-22 LAB — COMPREHENSIVE METABOLIC PANEL
ALT: 14 IU/L (ref 0–32)
AST: 18 IU/L (ref 0–40)
Albumin/Globulin Ratio: 2.1 (ref 1.2–2.2)
Albumin: 4.7 g/dL (ref 3.8–4.8)
Alkaline Phosphatase: 59 IU/L (ref 39–117)
BUN/Creatinine Ratio: 11 — ABNORMAL LOW (ref 12–28)
BUN: 11 mg/dL (ref 8–27)
Bilirubin Total: 0.2 mg/dL (ref 0.0–1.2)
CO2: 19 mmol/L — ABNORMAL LOW (ref 20–29)
Calcium: 9.3 mg/dL (ref 8.7–10.3)
Chloride: 105 mmol/L (ref 96–106)
Creatinine, Ser: 1.01 mg/dL — ABNORMAL HIGH (ref 0.57–1.00)
GFR calc Af Amer: 67 mL/min/{1.73_m2} (ref >59)
GFR calc non Af Amer: 58 mL/min/{1.73_m2} — ABNORMAL LOW (ref >59)
Globulin, Total: 2.2 g/dL (ref 1.5–4.5)
Glucose: 95 mg/dL (ref 65–99)
Potassium: 4.4 mmol/L (ref 3.5–5.2)
Sodium: 137 mmol/L (ref 134–144)
Total Protein: 6.9 g/dL (ref 6.0–8.5)

## 2019-04-22 LAB — LIPID PANEL
Chol/HDL Ratio: 4.1 ratio (ref 0.0–4.4)
Cholesterol, Total: 295 mg/dL — ABNORMAL HIGH (ref 100–199)
HDL: 72 mg/dL (ref >39)
LDL Chol Calc (NIH): 198 mg/dL — ABNORMAL HIGH (ref 0–99)
Triglycerides: 140 mg/dL (ref 0–149)
VLDL Cholesterol Cal: 25 mg/dL (ref 5–40)

## 2019-04-22 LAB — VITAMIN D 25 HYDROXY (VIT D DEFICIENCY, FRACTURES): Vit D, 25-Hydroxy: 12.3 ng/mL — ABNORMAL LOW (ref 30.0–100.0)

## 2019-04-22 MED ORDER — ROSUVASTATIN CALCIUM 5 MG PO TABS: ORAL_TABLET | ORAL | 3 refills | Status: DC

## 2019-04-22 MED ORDER — VITAMIN D (ERGOCALCIFEROL) 1.25 MG (50000 UNIT) PO CAPS: 50000.0000 [IU] | ORAL_CAPSULE | ORAL | 4 refills | Status: DC

## 2019-04-22 MED FILL — VIT D2 1.25 MG (50,000 UNIT: 1.25 MG | 84 days supply | Qty: 6 | Fill #0

## 2019-04-22 MED FILL — ROSUVASTATIN CALCIUM 5 MG T: 5 | 70 days supply | Qty: 30 | Fill #0

## 2019-04-24 ENCOUNTER — Other Ambulatory Visit: Payer: Self-pay

## 2019-04-25 ENCOUNTER — Encounter

## 2019-04-25 ENCOUNTER — Encounter: Payer: Self-pay | Admitting: Obstetrics & Gynecology

## 2019-04-25 ENCOUNTER — Ambulatory Visit (INDEPENDENT_AMBULATORY_CARE_PROVIDER_SITE_OTHER): Payer: 59 | Admitting: Obstetrics & Gynecology

## 2019-04-25 VITALS — BP 100/66 | HR 76 | Temp 97.6°F | Ht 65.25 in | Wt 167.0 lb

## 2019-04-25 DIAGNOSIS — Z01419 Encounter for gynecological examination (general) (routine) without abnormal findings: Secondary | ICD-10-CM

## 2019-04-25 DIAGNOSIS — E7800 Pure hypercholesterolemia, unspecified: Secondary | ICD-10-CM | POA: Diagnosis not present

## 2019-04-25 MED ORDER — FERRALET 90 90-1 MG PO TABS: 1.0000 | ORAL_TABLET | Freq: Every day | ORAL | 5 refills | Status: DC

## 2019-04-25 NOTE — Progress Notes (Signed)
68 y.o. G9P2 Divorced White or Caucasian female here for annual exam.  Doing well.  Had a cat bite last fall.  Had joint cellulitis and ended up on antibiotics for this.  Denies vaginal bleeding.    Had blood work done with [name] on Monday.  LDLs were almost 200.  She was prescribed a statin, again, but she had been in statins in the past and has not tolerated this due to side effects.  She's had severe both pains related to statin use.  She was on Lipitor in the past.  She hasn't started the Crestor now.  She does report some SOB with climbing stairs.  She relayed this to [name] and EKG in his office was normal.  D/w possible coronary calcium score to help with decision making.  Will refer to cardiology.    Patient's last menstrual period was 08/07/1998.          Sexually active: No.  The current method of family planning is status post hysterectomy.    Exercising: No.   Smoker:  vape   Health Maintenance: Pap:  2009 History of abnormal Pap:  no MMG:  04-04-2019 category b density birads 1:neg Colonoscopy:  08/18/14 hyperplastic polyp (with colon perforation) BMD:   08/27/17 osteopenia TDaP:  06/02/2018 Pneumonia vaccine(s):  2019, 2020 Shingrix:  Completed  Hep C testing: 09/29/16 ng Screening Labs: PCP   reports that she quit smoking about 15 years ago. Her smoking use included cigarettes. She smoked 0.50 packs per day. She has never used smokeless tobacco. She reports current alcohol use of about 10.0 standard drinks of alcohol per week. She reports that she does not use drugs.  Past Medical History:  Diagnosis Date  . Anxiety   . Complication of anesthesia    past hx. laryngospasm following 2 past surgeries, not recent  . Depression   . GERD (gastroesophageal reflux disease)   . Hyperlipidemia   . Insomnia    periodically uses med.  . Perforated sigmoid colon ([place]) 04/28/14   during colonoscopy, "rupture colon"became septic, has colostomy and open surgical wound  .  Prediabetes 08/09/2017  . Suicide attempt ([place])   . Vitamin D deficiency     Past Surgical History:  Procedure Laterality Date  . ABDOMINAL HYSTERECTOMY  1999   TAH/BSO  . APPLICATION OF WOUND VAC  04/28/2014   Procedure: APPLICATION OF WOUND VAC;  Surgeon: Rolm Bookbinder, MD;  Location: [place];  Service: General;;  . BLEPHAROPLASTY  2010  . BREAST SURGERY  2001   Breast reduction  . COLOSTOMY N/A 04/28/2014   Procedure: COLOSTOMY;  Surgeon: Rolm Bookbinder, MD;  Location: [place];  Service: General;  Laterality: N/A;  . COLOSTOMY REVISION N/A 04/28/2014   Procedure: COLON RESECTION SIGMOID;  Surgeon: Rolm Bookbinder, MD;  Location: [place];  Service: General;  Laterality: N/A;  . COLOSTOMY TAKEDOWN N/A 09/02/2014   Procedure: LAPAROSCOPIC COLOSTOMY REVERSAL;  Surgeon: [name], MD;  Location: [facility];  Service: General;  Laterality: N/A;  . COSMETIC SURGERY    . DEBRIDEMENT TENNIS ELBOW    . DILATION AND CURETTAGE OF UTERUS     x3, hysteroscopy  . ELBOW SURGERY  2007  . EYE SURGERY     Blepharoplasty  . LAPAROTOMY N/A 04/28/2014   Procedure: EXPLORATORY LAPAROTOMY,SIGMOID COLECTOMY;  Surgeon: Rolm Bookbinder, MD;  Location: [place];  Service: General;  Laterality: N/A;  . REDUCTION MAMMAPLASTY Bilateral   . scar and adhesion repair  06/01/15  and liposuction  . ULNAR TUNNEL RELEASE Left 2010    Current Outpatient Medications  Medication Sig Dispense Refill  . buPROPion (WELLBUTRIN XL) 150 MG 24 hr tablet   2  . citalopram (CELEXA) 20 MG tablet   2  . esomeprazole (NEXIUM) 40 MG capsule TAKE 1 CAPSULE BY MOUTH ONCE DAILY BEFORE BREAKFAST 30 capsule 1  . Eszopiclone 3 MG TABS TAKE 1 TABLET BY MOUTH IMMEDIATELY BEFORE BEDTIME 30 tablet 4  . Ferrous Sulfate (SLOW FE PO) Take by mouth daily.    Marland Kitchen ibuprofen (ADVIL,MOTRIN) 200 MG tablet Take 800 mg by mouth every 6 (six) hours as needed for mild pain or moderate pain.    . Ibuprofen-Famotidine (DUEXIS) 800-26.6 MG TABS Take 1  tablet by mouth daily.    Marland Kitchen topiramate (TOPAMAX) 50 MG tablet Take 1 tablet by mouth daily.    . Vitamin D, Ergocalciferol, (DRISDOL) 1.25 MG (50000 UT) CAPS capsule Take 1 capsule (50,000 Units total) by mouth every 14 (fourteen) days. 6 capsule 4  . rosuvastatin (CRESTOR) 5 MG tablet Take 1 pill 3 times per week (Patient not taking: Reported on 04/25/2019) 30 tablet 3   No current facility-administered medications for this visit.     Family History  Problem Relation Age of Onset  . Hyperlipidemia Father   . Hypertension Father   . Diabetes Father   . Cancer Father        History of bladder cancer  . Heart disease Father   . Other Father        4 ft intestinal removed-had gangrene in gallbladder, then spread to intestines  . Hypertension Mother   . Diabetes Mother   . Heart disease Maternal Grandmother   . Stroke Maternal Grandfather   . Hyperlipidemia Brother   . Cancer Paternal Grandmother        uterine  . Cancer Paternal Grandfather        pancreatic cancer  . Esophageal cancer Sister     Review of Systems  All other systems reviewed and are negative.   Exam:   BP 100/66   Pulse 76   Temp 97.6 F (36.4 C) (Temporal)   Ht 5' 5.25" (1.657 m)   Wt 167 lb (75.8 kg)   LMP 08/07/1998   BMI 27.58 kg/m   Height:   Height: 5' 5.25" (165.7 cm)  Ht Readings from Last 3 Encounters:  04/25/19 5' 5.25" (1.657 m)  04/21/19 5' 5.5" (1.664 m)  01/11/18 5' 5.75" (1.67 m)    General appearance: alert, cooperative and appears stated age Head: Normocephalic, without obvious abnormality, atraumatic Neck: no adenopathy, supple, symmetrical, trachea midline and thyroid normal to inspection and palpation Lungs: clear to auscultation bilaterally Breasts: normal appearance, no masses or tenderness Heart: regular rate and rhythm Abdomen: soft, non-tender; bowel sounds normal; no masses,  no organomegaly Extremities: extremities normal, atraumatic, no cyanosis or edema Skin: Skin  color, texture, turgor normal. No rashes or lesions Lymph nodes: Cervical, supraclavicular, and axillary nodes normal. No abnormal inguinal nodes palpated Neurologic: Grossly normal   Pelvic: External genitalia:  no lesions              Urethra:  normal appearing urethra with no masses, tenderness or lesions              Bartholins and Skenes: normal                 Vagina: normal appearing vagina with normal color and discharge, no lesions  Cervix: absent              Pap taken: No. Bimanual Exam:  Uterus:  uterus absent              Adnexa: no mass, fullness, tenderness               Rectovaginal: Confirms               Anus:  normal sphincter tone, no lesions  Chaperone was present for exam.  A:  Well Woman with normal exam PMP, no HRT H/o TAH/USO 1999 H/o depression with prior suicide attempt, followed by Shirlean Mylar at [facility] nicotine (stopped smoking) HSV 1 hx Vit D deficiency Anemia Elevated LDLs  P:   Mammogram guidelines reviewed.  Doing 3D. pap smear not indicated On Vit D Does not need RF at this time for Lunesta.  Will call when she does need RF. Lab work done earlier this week Pt requests prescription iron--Ferralet rx to pharmacy. Declines future colonoscopy screening.  Will do cologuard and possible virtual colonoscopy. Referral to cardiology to consider coronary calcium score return annually or prn

## 2019-05-06 MED FILL — ESZOPICLONE 3 MG TABS: 3 | 30 days supply | Qty: 30 | Fill #2

## 2019-05-08 ENCOUNTER — Telehealth: Payer: Self-pay

## 2019-05-08 NOTE — Telephone Encounter (Signed)
Fax received from [facility] regarding [place] 90-Dual-Iron prescription which states, "copay was $61.63. Can it be changed to something else? Thank you."  Please advise.

## 2019-05-14 MED FILL — buPROPion HCL ER (XL) 150 M: 150 | 30 days supply | Qty: 60 | Fill #1

## 2019-05-14 MED FILL — CITALOPRAM HBR 20 MG TABLET: 20 | 30 days supply | Qty: 30 | Fill #1

## 2019-05-14 MED FILL — TOPIRAMATE 50 MG TABLET: 50 | 30 days supply | Qty: 30 | Fill #1

## 2019-06-19 MED FILL — TOPIRAMATE 50 MG TABLET: 50 | 30 days supply | Qty: 30 | Fill #2

## 2019-06-19 MED FILL — CITALOPRAM HBR 20 MG TABLET: 20 | 30 days supply | Qty: 30 | Fill #2

## 2019-06-19 MED FILL — BUPROPION HCL XL 150 MG TAB: 150 | 30 days supply | Qty: 60 | Fill #2

## 2019-06-24 NOTE — Telephone Encounter (Signed)
Pt was advised at [facility] of OTC options as well.  Ok to close encounter.

## 2019-07-11 ENCOUNTER — Other Ambulatory Visit: Payer: Self-pay | Admitting: Obstetrics & Gynecology

## 2019-07-11 ENCOUNTER — Encounter: Payer: Self-pay | Admitting: Cardiovascular Disease

## 2019-07-11 ENCOUNTER — Other Ambulatory Visit: Payer: Self-pay

## 2019-07-11 ENCOUNTER — Ambulatory Visit (INDEPENDENT_AMBULATORY_CARE_PROVIDER_SITE_OTHER): Payer: 59 | Admitting: Cardiovascular Disease

## 2019-07-11 VITALS — BP 126/77 | HR 75 | Temp 97.3°F | Ht 66.0 in | Wt 172.0 lb

## 2019-07-11 DIAGNOSIS — E785 Hyperlipidemia, unspecified: Secondary | ICD-10-CM

## 2019-07-11 DIAGNOSIS — R0602 Shortness of breath: Secondary | ICD-10-CM

## 2019-07-11 DIAGNOSIS — R0609 Other forms of dyspnea: Secondary | ICD-10-CM

## 2019-07-11 DIAGNOSIS — Z01419 Encounter for gynecological examination (general) (routine) without abnormal findings: Secondary | ICD-10-CM

## 2019-07-11 DIAGNOSIS — R0600 Dyspnea, unspecified: Secondary | ICD-10-CM

## 2019-07-11 MED ORDER — ROSUVASTATIN CALCIUM 5 MG PO TABS: ORAL_TABLET | ORAL | 3 refills | Status: DC

## 2019-07-11 MED ORDER — ROSUVASTATIN CALCIUM 5 MG PO TABS: 5.0000 mg | ORAL_TABLET | Freq: Every day | ORAL | 3 refills | Status: DC

## 2019-07-11 MED ORDER — METOPROLOL TARTRATE 100 MG PO TABS: ORAL_TABLET | ORAL | 0 refills | Status: DC

## 2019-07-11 MED FILL — METOPROLOL TARTRATE 100 MG: 100 | 1 days supply | Qty: 1 | Fill #0

## 2019-07-11 MED FILL — ROSUVASTATIN CALCIUM 5 MG T: 5 | 90 days supply | Qty: 90 | Fill #0

## 2019-07-11 NOTE — Telephone Encounter (Signed)
Med refill request: Eszpiclone  Last AEX: 04/25/2019 Next AEX:07/06/2020 Last MMG (if hormonal med) 04/04/19, BIRADS 1, Neg Refill authorized: # 30, 4RF, pended if approved.

## 2019-07-11 NOTE — Progress Notes (Signed)
Cardiology Office Note   Date:  07/11/2019   ID:  [name], [name] 05-27-1951, MRN [id number]  PCP:  Denita Lung, MD  Cardiologist:   Skeet Latch, MD   No chief complaint on file.     History of Present Illness: [name] is a 68 y.o. female with hyperlipidemia, prediabetes, and GERD who presents for management of hyperlipidemia.  [name] reports a history of hyperlipidemia but has been unable to tolerate atorvastatin due to myalgias.  She saw [name] who recommended she start atorvastatin 5mg  daily.  However she never picked it up to do concerned about potential side effects.  She already has tennis elbow and bursitis in both hips.  She is afraid that myalgias would make this worse.  She has an extensive family history of CAD.  Her father had 3 heart attacks, the first in his 77s.  She also has a son and nieces with very elevated cholesterol levels.  In the past she used to exercise a lot.  However lately she feels exhausted after carrying groceries.  She gets short of breath with minimal exertion.  She denies chest pain or pressure.  She also denies lower extremity edema, orthopnea, or PND.     Past Medical History:  Diagnosis Date  . Anxiety   . Complication of anesthesia    past hx. laryngospasm following 2 past surgeries, not recent  . Depression   . GERD (gastroesophageal reflux disease)   . Hyperlipidemia   . Insomnia    periodically uses med.  . Perforated sigmoid colon ([place]) 04/28/14   during colonoscopy, "rupture colon"became septic, has colostomy and open surgical wound  . Prediabetes 08/09/2017  . Suicide attempt ([place])   . Vitamin D deficiency     Past Surgical History:  Procedure Laterality Date  . ABDOMINAL HYSTERECTOMY  1999   TAH/BSO  . APPLICATION OF WOUND VAC  04/28/2014   Procedure: APPLICATION OF WOUND VAC;  Surgeon: Rolm Bookbinder, MD;  Location: [place];  Service: General;;  . BLEPHAROPLASTY  2010  . BREAST SURGERY  2001    Breast reduction  . COLOSTOMY N/A 04/28/2014   Procedure: COLOSTOMY;  Surgeon: Rolm Bookbinder, MD;  Location: [place];  Service: General;  Laterality: N/A;  . COLOSTOMY REVISION N/A 04/28/2014   Procedure: COLON RESECTION SIGMOID;  Surgeon: Rolm Bookbinder, MD;  Location: [place];  Service: General;  Laterality: N/A;  . COLOSTOMY TAKEDOWN N/A 09/02/2014   Procedure: LAPAROSCOPIC COLOSTOMY REVERSAL;  Surgeon: [name], MD;  [facility];  Service: General;  Laterality: N/A;  . COSMETIC SURGERY    . DEBRIDEMENT TENNIS ELBOW    . DILATION AND CURETTAGE OF UTERUS     x3, hysteroscopy  . ELBOW SURGERY  2007  . EYE SURGERY     Blepharoplasty  . LAPAROTOMY N/A 04/28/2014   Procedure: EXPLORATORY LAPAROTOMY,SIGMOID COLECTOMY;  Surgeon: Rolm Bookbinder, MD;  Location: [facility];  Service: General;  Laterality: N/A;  . REDUCTION MAMMAPLASTY Bilateral   . scar and adhesion repair  06/01/15   and liposuction  . ULNAR TUNNEL RELEASE Left 2010     Current Outpatient Medications  Medication Sig Dispense Refill  . buPROPion (WELLBUTRIN XL) 150 MG 24 hr tablet   2  . citalopram (CELEXA) 20 MG tablet   2  . esomeprazole (NEXIUM) 40 MG capsule TAKE 1 CAPSULE BY MOUTH ONCE DAILY BEFORE BREAKFAST 30 capsule 1  . Eszopiclone 3 MG TABS TAKE 1 TABLET BY  MOUTH IMMEDIATELY BEFORE BEDTIME 30 tablet 4  . Fe Cbn-Fe Gluc-FA-B12-C-DSS (FERRALET 90) 90-1 MG TABS Take 1 tablet by mouth daily. 30 tablet 5  . Ferrous Sulfate (SLOW FE PO) Take by mouth daily.    Marland Kitchen ibuprofen (ADVIL,MOTRIN) 200 MG tablet Take 800 mg by mouth every 6 (six) hours as needed for mild pain or moderate pain.    . Ibuprofen-Famotidine (DUEXIS) 800-26.6 MG TABS Take 1 tablet by mouth daily.    Marland Kitchen topiramate (TOPAMAX) 50 MG tablet Take 1 tablet by mouth daily.    . Vitamin D, Ergocalciferol, (DRISDOL) 1.25 MG (50000 UT) CAPS capsule Take 1 capsule (50,000 Units total) by mouth every 14 (fourteen) days. 6 capsule 4   No current  facility-administered medications for this visit.     Allergies:   Bee venom and Statins    Social History:  The patient  reports that she quit smoking about 15 years ago. Her smoking use included cigarettes. She smoked 0.50 packs per day. She has never used smokeless tobacco. She reports current alcohol use of about 10.0 standard drinks of alcohol per week. She reports that she does not use drugs.   Family History:  The patient's family history includes Cancer in her father, paternal grandfather, and paternal grandmother; Diabetes in her father and mother; Esophageal cancer in her sister; Heart disease in her father and maternal grandmother; Hyperlipidemia in her brother and father; Hypertension in her father and mother; Other in her father; Stroke in her maternal grandfather.    ROS:  Please see the history of present illness.   Otherwise, review of systems are positive for none.   All other systems are reviewed and negative.    PHYSICAL EXAM: VS:  BP 126/77   Pulse 75   Temp (!) 97.3 F (36.3 C)   Ht 5\' 6"  (1.676 m)   Wt 172 lb (78 kg)   LMP 08/07/1998   SpO2 100%   BMI 27.76 kg/m  , BMI Body mass index is 27.76 kg/m. GENERAL:  Well appearing HEENT:  Pupils equal round and reactive, fundi not visualized, oral mucosa unremarkable NECK:  No jugular venous distention, waveform within normal limits, carotid upstroke brisk and symmetric, no bruits, no thyromegaly LYMPHATICS:  No cervical adenopathy LUNGS:  Clear to auscultation bilaterally HEART:  RRR.  PMI not displaced or sustained,S1 and S2 within normal limits, no S3, no S4, no clicks, no rubs, no murmurs ABD:  Flat, positive bowel sounds normal in frequency in pitch, no bruits, no rebound, no guarding, no midline pulsatile mass, no hepatomegaly, no splenomegaly EXT:  2 plus pulses throughout, no edema, no cyanosis no clubbing SKIN:  No rashes no nodules NEURO:  Cranial nerves II through XII grossly intact, motor grossly intact  throughout PSYCH:  Cognitively intact, oriented to person place and time    EKG:  EKG is not ordered today. The ekg ordered 04/21/19 demonstrates sinus rhythm.  Rate 88 bpm.   Recent Labs: 04/21/2019: ALT 14; BUN 11; Creatinine, Ser 1.01; Hemoglobin 9.9; Platelets 449; Potassium 4.4; Sodium 137    Lipid Panel    Component Value Date/Time   CHOL 295 (H) 04/21/2019 1441   TRIG 140 04/21/2019 1441   HDL 72 04/21/2019 1441   CHOLHDL 4.1 04/21/2019 1441   CHOLHDL 3.3 08/08/2017 1629   VLDL 20 09/29/2016 1324   LDLCALC 198 (H) 04/21/2019 1441   LDLCALC 183 (H) 08/08/2017 1629   LDLDIRECT 186.4 08/04/2010 0827      Wt  Readings from Last 3 Encounters:  07/11/19 172 lb (78 kg)  04/25/19 167 lb (75.8 kg)  04/21/19 166 lb (75.3 kg)      ASSESSMENT AND PLAN:  # Exertional dyspnea: Ms. Nelis has exertional dyspnea that is getting progressively worse over the last several months.  She also has uncontrolled hyperlipidemia, increasing her risk of obstructive CAD.  We will get a coronary CT-A to better assess her coronaries.  She has no evidence of heart failure or valvular disease on exam.   # Pure hypercholesterolemia:   LDL has been 183-198.  This likely represents familial hyperlipidemia.  Her father had premature coronary artery disease and she has a child and nieces with very elevated lipids as well.  We will get a coronary CT-a to assess for obstructive disease as above.  She is willing to try the rosuvastatin that was previously prescribed.  I suspect that it will not be enough to get her LDL less than 70.  She is also going to work on diet and exercise.    Current medicines are reviewed at length with the patient today.  The patient does not have concerns regarding medicines.  The following changes have been made:  no change  Labs/ tests ordered today include: No orders of the defined types were placed in this encounter.    Disposition:   FU with Jannelly Bergren C. Oval Linsey, MD,  [facility] in 3 months.     Signed, Aleya Durnell C. Oval Linsey, MD, [facility]  07/11/2019 2:25 PM    [facility]

## 2019-07-11 NOTE — Patient Instructions (Addendum)
Medication Instructions:  TAKE METOPROLOL 100 MG 2 HOURS PRIOR TO CT  ROSUVASTATIN 5 MG DAILY SENT TO PHARMACY   *If you need a refill on your cardiac medications before your next appointment, please call your pharmacy*  Lab Work: BMET 1 WEEK PRIOR TO CT  If you have labs (blood work) drawn today and your tests are completely normal, you will receive your results only by: Marland Kitchen MyChart Message (if you have MyChart) OR . A paper copy in the mail If you have any lab test that is abnormal or we need to change your treatment, we will call you to review the results.  Testing/Procedures: Your physician has requested that you have cardiac CT. Cardiac computed tomography (CT) is a painless test that uses an x-ray machine to take clear, detailed pictures of your heart. For further information please visit [email or web address].tn. Please follow instruction sheet as given. ONCE YOUR INSURANCE HAS APPROVED THE OFFICE WILL CALL YOU TO SCHEDULE. IF YOU HAVE NOT HEARD IN 2 WEEKS CALL [phone]  Follow-Up: At [facility], you and your health needs are our priority.  As part of our continuing mission to provide you with exceptional heart care, we have created designated Provider Care Teams.  These Care Teams include your primary Cardiologist (physician) and Advanced Practice Providers (APPs -  Physician Assistants and Nurse Practitioners) who all work together to provide you with the care you need, when you need it.  Your next appointment:   3 month(s)  The format for your next appointment:   Either In Person or Virtual  Provider:   You may see DR [facility]  or one of the following Advanced Practice Providers on your designated Care Team:    Kerin Ransom, PA-C  [place], [place]  Coletta Memos, [place]   Other Instructions  Your cardiac CT will be scheduled at one of the below locations:   [facility] [address] [place], [place] [address] [phone]  [place] [address] [place], [place] [address] [phone]  If scheduled at [facility], please arrive at the [facility] main entrance of [facility] 30-45 minutes prior to test start time. Proceed to the [facility] Radiology Department (first floor) to check-in and test prep.  If scheduled at [facility], please arrive 15 mins early for check-in and test prep.  Please follow these instructions carefully (unless otherwise directed):  Hold all erectile dysfunction medications at least 3 days (72 hrs) prior to test.  On the Night Before the Test: . Be sure to Drink plenty of water. . Do not consume any caffeinated/decaffeinated beverages or chocolate 12 hours prior to your test. . Do not take any antihistamines 12 hours prior to your test. . If the patient has contrast allergy: ? Patient will need a prescription for Prednisone and very clear instructions (as follows): 1. Prednisone 50 mg - take 13 hours prior to test 2. Take another Prednisone 50 mg 7 hours prior to test 3. Take another Prednisone 50 mg 1 hour prior to test 4. Take Benadryl 50 mg 1 hour prior to test . Patient must complete all four doses of above prophylactic medications. . Patient will need a ride after test due to Benadryl.  On the Day of the Test: . Drink plenty of water. Do not drink any water within one hour of the test. . Do not eat any food 4 hours prior to the test. .  You may take your regular medications prior to the test.  . Take metoprolol (Lopressor) two hours prior to test. . HOLD Furosemide/Hydrochlorothiazide morning of the test. . FEMALES- please wear underwire-free bra if available  Do not give Lopressor to patients with an allergy to lopressor or anyone with asthma or active COPD symptoms (currently taking steroids).      After the Test: . Drink plenty of water. . After receiving IV contrast, you may experience  a mild flushed feeling. This is normal. . On occasion, you may experience a mild rash up to 24 hours after the test. This is not dangerous. If this occurs, you can take Benadryl 25 mg and increase your fluid intake. . If you experience trouble breathing, this can be serious. If it is severe call 911 IMMEDIATELY. If it is mild, please call our office. . If you take any of these medications: Glipizide/Metformin, Avandament, Glucavance, please do not take 48 hours after completing test unless otherwise instructed.   Once we have confirmed authorization from your insurance company, we will call you to set up a date and time for your test.   For non-scheduling related questions, please contact the cardiac imaging nurse navigator should you have any questions/concerns: Marchia Bond, RN Navigator Cardiac Imaging [facility] [phone] Office    Cardiac CT Angiogram  A cardiac CT angiogram is a procedure to look at the heart and the area around the heart. It may be done to help find the cause of chest pains or other symptoms of heart disease. During this procedure, a large X-ray machine, called a CT scanner, takes detailed pictures of the heart and the surrounding area after a dye (contrast material) has been injected into blood vessels in the area. The procedure is also sometimes called a coronary CT angiogram, coronary artery scanning, or CTA. A cardiac CT angiogram allows the health care provider to see how well blood is flowing to and from the heart. The health care provider will be able to see if there are any problems, such as:  Blockage or narrowing of the coronary arteries in the heart.  Fluid around the heart.  Signs of weakness or disease in the muscles, valves, and tissues of the heart. Tell a health care provider about:  Any allergies you have. This is especially important if you have had a previous allergic reaction to contrast dye.  All medicines you are  taking, including vitamins, herbs, eye drops, creams, and over-the-counter medicines.  Any blood disorders you have.  Any surgeries you have had.  Any medical conditions you have.  Whether you are pregnant or may be pregnant.  Any anxiety disorders, chronic pain, or other conditions you have that may increase your stress or prevent you from lying still. What are the risks? Generally, this is a safe procedure. However, problems may occur, including:  Bleeding.  Infection.  Allergic reactions to medicines or dyes.  Damage to other structures or organs.  Kidney damage from the dye or contrast that is used.  Increased risk of cancer from radiation exposure. This risk is low. Talk with your health care provider about: ? The risks and benefits of testing. ? How you can receive the lowest dose of radiation. What happens before the procedure?  Wear comfortable clothing and remove any jewelry, glasses, dentures, and hearing aids.  Follow instructions from your health care provider about eating and drinking. This may include: ? For 12 hours before the test - avoid  caffeine. This includes tea, coffee, soda, energy drinks, and diet pills. Drink plenty of water or other fluids that do not have caffeine in them. Being well-hydrated can prevent complications. ? For 4-6 hours before the test - stop eating and drinking. The contrast dye can cause nausea, but this is less likely if your stomach is empty.  Ask your health care provider about changing or stopping your regular medicines. This is especially important if you are taking diabetes medicines, blood thinners, or medicines to treat erectile dysfunction. What happens during the procedure?  Hair on your chest may need to be removed so that small sticky patches called electrodes can be placed on your chest. These will transmit information that helps to monitor your heart during the test.  An IV tube will be inserted into one of your veins.   You might be given a medicine to control your heart rate during the test. This will help to ensure that good images are obtained.  You will be asked to lie on an exam table. This table will slide in and out of the CT machine during the procedure.  Contrast dye will be injected into the IV tube. You might feel warm, or you may get a metallic taste in your mouth.  You will be given a medicine (nitroglycerin) to relax (dilate) the arteries in your heart.  The table that you are lying on will move into the CT machine tunnel for the scan.  The person running the machine will give you instructions while the scans are being done. You may be asked to: ? Keep your arms above your head. ? Hold your breath. ? Stay very still, even if the table is moving.  When the scanning is complete, you will be moved out of the machine.  The IV tube will be removed. The procedure may vary among health care providers and hospitals. What happens after the procedure?  You might feel warm, or you may get a metallic taste in your mouth from the contrast dye.  You may have a headache from the nitroglycerin.  After the procedure, drink water or other fluids to wash (flush) the contrast material out of your body.  Contact a health care provider if you have any symptoms of allergy to the contrast. These symptoms include: ? Shortness of breath. ? Rash or hives. ? A racing heartbeat.  Most people can return to their normal activities right after the procedure. Ask your health care provider what activities are safe for you.  It is up to you to get the results of your procedure. Ask your health care provider, or the department that is doing the procedure, when your results will be ready. Summary  A cardiac CT angiogram is a procedure to look at the heart and the area around the heart. It may be done to help find the cause of chest pains or other symptoms of heart disease.  During this procedure, a large X-ray  machine, called a CT scanner, takes detailed pictures of the heart and the surrounding area after a dye (contrast material) has been injected into blood vessels in the area.  Ask your health care provider about changing or stopping your regular medicines before the procedure. This is especially important if you are taking diabetes medicines, blood thinners, or medicines to treat erectile dysfunction.  After the procedure, drink water or other fluids to wash (flush) the contrast material out of your body. This information is not intended to replace advice given  to you by your health care provider. Make sure you discuss any questions you have with your health care provider. Document Released: 07/06/2008 Document Revised: 07/06/2017 Document Reviewed: 06/12/2016 Elsevier Patient Education  2020 [employer].

## 2019-07-14 MED FILL — ESZOPICLONE 3 MG TABS: 3 | 30 days supply | Qty: 30 | Fill #0

## 2019-08-04 ENCOUNTER — Encounter: Payer: Self-pay | Admitting: Cardiovascular Disease

## 2019-08-04 DIAGNOSIS — R0600 Dyspnea, unspecified: Secondary | ICD-10-CM

## 2019-08-04 DIAGNOSIS — R0609 Other forms of dyspnea: Secondary | ICD-10-CM

## 2019-08-04 HISTORY — DX: Other forms of dyspnea: R06.09

## 2019-08-04 HISTORY — DX: Dyspnea, unspecified: R06.00

## 2019-08-18 MED FILL — ESZOPICLONE 3 MG TABS: 3 | 30 days supply | Qty: 30 | Fill #1

## 2019-08-18 MED FILL — CITALOPRAM HBR 20 MG TABLET: 20 | 30 days supply | Qty: 30 | Fill #3

## 2019-08-18 MED FILL — VIT D2 1.25 MG (50,000 UNIT: 1.25 MG | 84 days supply | Qty: 6 | Fill #1

## 2019-08-18 MED FILL — buPROPion HCL ER (XL) 150 M: 150 | 30 days supply | Qty: 60 | Fill #3

## 2019-08-18 MED FILL — TOPIRAMATE 50 MG TABLET: 50 | 30 days supply | Qty: 30 | Fill #3

## 2019-08-27 DIAGNOSIS — R0600 Dyspnea, unspecified: Secondary | ICD-10-CM | POA: Diagnosis not present

## 2019-08-28 LAB — BASIC METABOLIC PANEL
BUN/Creatinine Ratio: 14 (ref 12–28)
BUN: 13 mg/dL (ref 8–27)
CO2: 23 mmol/L (ref 20–29)
Calcium: 9.7 mg/dL (ref 8.7–10.3)
Chloride: 103 mmol/L (ref 96–106)
Creatinine, Ser: 0.95 mg/dL (ref 0.57–1.00)
GFR calc Af Amer: 72 mL/min/{1.73_m2} (ref >59)
GFR calc non Af Amer: 62 mL/min/{1.73_m2} (ref >59)
Glucose: 113 mg/dL — ABNORMAL HIGH (ref 65–99)
Potassium: 4.6 mmol/L (ref 3.5–5.2)
Sodium: 138 mmol/L (ref 134–144)

## 2019-09-03 ENCOUNTER — Telehealth (HOSPITAL_COMMUNITY): Payer: Self-pay | Admitting: Emergency Medicine

## 2019-09-03 ENCOUNTER — Encounter (HOSPITAL_COMMUNITY): Payer: Self-pay

## 2019-09-03 NOTE — Telephone Encounter (Signed)
Left message on voicemail with name and callback number Feliza Diven RN Navigator Cardiac Imaging [facility] [phone] Office [phone] Cell  

## 2019-09-04 ENCOUNTER — Ambulatory Visit (HOSPITAL_COMMUNITY)
Admission: RE | Admit: 2019-09-04 | Discharge: 2019-09-04 | Disposition: A | Discharge status: Home / Self Care | Payer: 59 | Source: Ambulatory Visit | Attending: Cardiovascular Disease | Admitting: Cardiovascular Disease

## 2019-09-04 ENCOUNTER — Encounter: Payer: 59 | Admitting: *Deleted

## 2019-09-04 ENCOUNTER — Other Ambulatory Visit: Payer: Self-pay

## 2019-09-04 DIAGNOSIS — R0602 Shortness of breath: Secondary | ICD-10-CM | POA: Diagnosis not present

## 2019-09-04 DIAGNOSIS — Z006 Encounter for examination for normal comparison and control in clinical research program: Secondary | ICD-10-CM

## 2019-09-04 DIAGNOSIS — E785 Hyperlipidemia, unspecified: Secondary | ICD-10-CM | POA: Diagnosis not present

## 2019-09-04 DIAGNOSIS — R0609 Other forms of dyspnea: Secondary | ICD-10-CM

## 2019-09-04 DIAGNOSIS — R0600 Dyspnea, unspecified: Secondary | ICD-10-CM | POA: Diagnosis not present

## 2019-09-04 MED ORDER — NITROGLYCERIN 0.4 MG SL SUBL
0.8000 mg | SUBLINGUAL_TABLET | Freq: Once | SUBLINGUAL | Status: AC
  Administered 2019-09-04: 0.8 mg via SUBLINGUAL

## 2019-09-04 MED ORDER — NITROGLYCERIN 0.4 MG SL SUBL
SUBLINGUAL_TABLET | SUBLINGUAL | Status: AC
  Filled 2019-09-04: qty 2

## 2019-09-04 MED ORDER — IOHEXOL 350 MG/ML SOLN
80.0000 mL | Freq: Once | INTRAVENOUS | Status: AC | PRN
  Administered 2019-09-04: 80 mL via INTRAVENOUS

## 2019-09-04 NOTE — Research (Signed)
CADFEM Informed Consent                  Subject Name:   [name]   Subject met inclusion and exclusion criteria.  The informed consent form, study requirements and expectations were reviewed with the subject and questions and concerns were addressed prior to the signing of the consent form.  The subject verbalized understanding of the trial requirements.  The subject agreed to participate in the CADFEM trial and signed the informed consent.  The informed consent was obtained prior to performance of any protocol-specific procedures for the subject.  A copy of the signed informed consent was given to the subject and a copy was placed in the subject's medical record.   [place] Farha Dano, Research Assistant  09/04/2019 10:22 a.m.

## 2019-10-03 MED FILL — ESZOPICLONE 3 MG TABS: 3 | 30 days supply | Qty: 30 | Fill #2

## 2019-10-17 ENCOUNTER — Encounter: Payer: Self-pay | Admitting: Cardiovascular Disease

## 2019-10-17 ENCOUNTER — Other Ambulatory Visit: Payer: Self-pay

## 2019-10-17 ENCOUNTER — Ambulatory Visit (INDEPENDENT_AMBULATORY_CARE_PROVIDER_SITE_OTHER): Payer: 59 | Admitting: Cardiovascular Disease

## 2019-10-17 VITALS — BP 126/82 | HR 84 | Ht 66.0 in | Wt 168.6 lb

## 2019-10-17 DIAGNOSIS — K449 Diaphragmatic hernia without obstruction or gangrene: Secondary | ICD-10-CM | POA: Diagnosis not present

## 2019-10-17 DIAGNOSIS — E785 Hyperlipidemia, unspecified: Secondary | ICD-10-CM

## 2019-10-17 DIAGNOSIS — K219 Gastro-esophageal reflux disease without esophagitis: Secondary | ICD-10-CM | POA: Diagnosis not present

## 2019-10-17 DIAGNOSIS — Z5181 Encounter for therapeutic drug level monitoring: Secondary | ICD-10-CM | POA: Diagnosis not present

## 2019-10-17 HISTORY — DX: Diaphragmatic hernia without obstruction or gangrene: K44.9

## 2019-10-17 NOTE — Patient Instructions (Addendum)
Medication Instructions:  RESTART YOUR ROSUVASTATIN 5 MG DIALY   *If you need a refill on your cardiac medications before your next appointment, please call your pharmacy*  Lab Work: FASTING LP/CMET IN 3 MONTHS  If you have labs (blood work) drawn today and your tests are completely normal, you will receive your results only by: Marland Kitchen MyChart Message (if you have MyChart) OR . A paper copy in the mail If you have any lab test that is abnormal or we need to change your treatment, we will call you to review the results.  Testing/Procedures: NONE   Follow-Up: At [facility], you and your health needs are our priority.  As part of our continuing mission to provide you with exceptional heart care, we have created designated Provider Care Teams.  These Care Teams include your primary Cardiologist (physician) and Advanced Practice Providers (APPs -  Physician Assistants and Nurse Practitioners) who all work together to provide you with the care you need, when you need it.  We recommend signing up for the patient portal called "MyChart".  Sign up information is provided on this After Visit Summary.  MyChart is used to connect with patients for Virtual Visits (Telemedicine).  Patients are able to view lab/test results, encounter notes, upcoming appointments, etc.  Non-urgent messages can be sent to your provider as well.   To learn more about what you can do with MyChart, go to [email or web address].    Your next appointment:   12 month(s)  The format for your next appointment:   EITHER IN PERSON OR VIRTUAL   Provider:   You may see DR [facility]  or one of the following Advanced Practice Providers on your designated Care Team:    Kerin Ransom, PA-C  [place], [place]  Coletta Memos, [place]

## 2019-10-17 NOTE — Progress Notes (Signed)
Cardiology Office Note   Date:  10/17/2019   ID:  Jo, [name] 08-14-1950, MRN [id number]  PCP:  Denita Lung, MD  Cardiologist:   Skeet Latch, MD   No chief complaint on file.     History of Present Illness: [name] is a 69 y.o. female with familial hyperlipidemia, prediabetes, and GERD who presents for follow up.  She was initially seen 07/2019 for management of hyperlipidemia.  [name] reports a history of hyperlipidemia but has been unable to tolerate atorvastatin due to myalgias.  She saw [name] who recommended she start atorvastatin 5mg  daily.  However she never picked it up due to concern about potential side effects.  At her appointment there is some concern about exertional dyspnea so she was referred for coronary CT-A.  This revealed normal coronaries and a calcium score of 0.  However she did have a moderate hiatal hernia.  She plans to follow-up with [name] and have this surgically repaired.  Overall she has been feeling well.  She does have frequent episodes of GERD.  She gets reflux sometimes even after drinking water.  She avoids eating large meals and does not eat late.  She has not experienced any lower extremity edema, orthopnea, or PND.  She had both her Covid vaccines and plans to start going to the gym soon.  Lately she has not been getting much exercise.  She denies exertional symptoms.   Past Medical History:  Diagnosis Date  . Anxiety   . Complication of anesthesia    past hx. laryngospasm following 2 past surgeries, not recent  . Depression   . Exertional dyspnea 08/04/2019  . GERD (gastroesophageal reflux disease)   . Hiatal hernia 10/17/2019  . Hyperlipidemia   . Insomnia    periodically uses med.  . Perforated sigmoid colon ([place]) 04/28/14   during colonoscopy, "rupture colon"became septic, has colostomy and open surgical wound  . Prediabetes 08/09/2017  . Suicide attempt ([place])   . Vitamin D deficiency     Past  Surgical History:  Procedure Laterality Date  . ABDOMINAL HYSTERECTOMY  1999   TAH/BSO  . APPLICATION OF WOUND VAC  04/28/2014   Procedure: APPLICATION OF WOUND VAC;  Surgeon: Rolm Bookbinder, MD;  Location: [facility];  Service: General;;  . BLEPHAROPLASTY  2010  . BREAST SURGERY  2001   Breast reduction  . COLOSTOMY N/A 04/28/2014   Procedure: COLOSTOMY;  Surgeon: Rolm Bookbinder, MD;  Location: [facility];  Service: General;  Laterality: N/A;  . COLOSTOMY REVISION N/A 04/28/2014   Procedure: COLON RESECTION SIGMOID;  Surgeon: Rolm Bookbinder, MD;  Location: [place];  Service: General;  Laterality: N/A;  . COLOSTOMY TAKEDOWN N/A 09/02/2014   Procedure: LAPAROSCOPIC COLOSTOMY REVERSAL;  Surgeon: [name], MD;  Location: [facility];  Service: General;  Laterality: N/A;  . COSMETIC SURGERY    . DEBRIDEMENT TENNIS ELBOW    . DILATION AND CURETTAGE OF UTERUS     x3, hysteroscopy  . ELBOW SURGERY  2007  . EYE SURGERY     Blepharoplasty  . LAPAROTOMY N/A 04/28/2014   Procedure: EXPLORATORY LAPAROTOMY,SIGMOID COLECTOMY;  Surgeon: Rolm Bookbinder, MD;  Location: [place];  Service: General;  Laterality: N/A;  . REDUCTION MAMMAPLASTY Bilateral   . scar and adhesion repair  06/01/15   and liposuction  . ULNAR TUNNEL RELEASE Left 2010     Current Outpatient Medications  Medication Sig Dispense Refill  . esomeprazole (NEXIUM) 40 MG  capsule TAKE 1 CAPSULE BY MOUTH ONCE DAILY BEFORE BREAKFAST 30 capsule 1  . Eszopiclone 3 MG TABS TAKE 1 TABLET BY MOUTH IMMEDIATELY BEFORE BEDTIME 30 tablet 4  . Fe Cbn-Fe Gluc-FA-B12-C-DSS (FERRALET 90) 90-1 MG TABS Take 1 tablet by mouth daily. 30 tablet 5  . Ferrous Sulfate (SLOW FE PO) Take by mouth daily.    Marland Kitchen ibuprofen (ADVIL,MOTRIN) 200 MG tablet Take 800 mg by mouth every 6 (six) hours as needed for mild pain or moderate pain.    . Ibuprofen-Famotidine (DUEXIS) 800-26.6 MG TABS Take 1 tablet by mouth daily.    . rosuvastatin (CRESTOR) 5 MG tablet Take 5 mg by  mouth daily.    . Vitamin D, Ergocalciferol, (DRISDOL) 1.25 MG (50000 UT) CAPS capsule Take 1 capsule (50,000 Units total) by mouth every 14 (fourteen) days. 6 capsule 4   No current facility-administered medications for this visit.    Allergies:   Bee venom and Statins    Social History:  The patient  reports that she quit smoking about 16 years ago. Her smoking use included cigarettes. She smoked 0.50 packs per day. She has never used smokeless tobacco. She reports current alcohol use of about 10.0 standard drinks of alcohol per week. She reports that she does not use drugs.   Family History:  The patient's family history includes Cancer in her father, paternal grandfather, and paternal grandmother; Diabetes in her father and mother; Esophageal cancer in her sister; Heart attack in her father; Heart disease in her father and maternal grandmother; Hyperlipidemia in her brother and father; Hypertension in her father and mother; Other in her father; Stroke in her maternal grandfather.    ROS:  Please see the history of present illness.   Otherwise, review of systems are positive for none.   All other systems are reviewed and negative.    PHYSICAL EXAM: VS:  BP 126/82   Pulse 84   Ht 5\' 6"  (1.676 m)   Wt 168 lb 9.6 oz (76.5 kg)   LMP 08/07/1998   SpO2 99%   BMI 27.21 kg/m  , BMI Body mass index is 27.21 kg/m. GENERAL:  Well appearing HEENT:  Pupils equal round and reactive, fundi not visualized, oral mucosa unremarkable NECK:  No jugular venous distention, waveform within normal limits, carotid upstroke brisk and symmetric, no bruits LUNGS:  Clear to auscultation bilaterally HEART:  RRR.  PMI not displaced or sustained,S1 and S2 within normal limits, no S3, no S4, no clicks, no rubs, no murmurs ABD:  Flat, positive bowel sounds normal in frequency in pitch, no bruits, no rebound, no guarding, no midline pulsatile mass, no hepatomegaly, no splenomegaly EXT:  2 plus pulses throughout, no  edema, no cyanosis no clubbing SKIN:  No rashes no nodules NEURO:  Cranial nerves II through XII grossly intact, motor grossly intact throughout PSYCH:  Cognitively intact, oriented to person place and time    EKG:  EKG is not ordered today. The ekg ordered 04/21/19 demonstrates sinus rhythm.  Rate 88 bpm.  Coronary CT-A 09/04/2019: No coronary artery disease.  Coronary calcium score 0.  Moderate hiatal hernia.  Recent Labs: 04/21/2019: ALT 14; Hemoglobin 9.9; Platelets 449 08/27/2019: BUN 13; Creatinine, Ser 0.95; Potassium 4.6; Sodium 138    Lipid Panel    Component Value Date/Time   CHOL 295 (H) 04/21/2019 1441   TRIG 140 04/21/2019 1441   HDL 72 04/21/2019 1441   CHOLHDL 4.1 04/21/2019 1441   CHOLHDL 3.3 08/08/2017 1629  VLDL 20 09/29/2016 1324   LDLCALC 198 (H) 04/21/2019 1441   LDLCALC 183 (H) 08/08/2017 1629   LDLDIRECT 186.4 08/04/2010 0827      Wt Readings from Last 3 Encounters:  10/17/19 168 lb 9.6 oz (76.5 kg)  07/11/19 172 lb (78 kg)  04/21/19 166 lb (75.3 kg)      ASSESSMENT AND PLAN:  # Exertional dyspnea:  Symptoms are not related to coronary artery disease.  No CAD was noted on her coronary CT-A.  She is encouraged to start exercising as planned.  # Pure hypercholesterolemia:   LDL has been 183-198.  This likely represents familial hyperlipidemia.  Her father had premature coronary artery disease and she has a child and nieces with very elevated lipids as well.  Fortunately there is no evidence of coronary calcification.  She is willing to resume rosuvastatin 5 mg 3 times per week.  She did not tolerate atorvastatin but has tolerated this regimen.  She will have lipids and a CMP checked in 3 months.  # Hiatal hernia:  She is contemplating surgery.  She would be low risk for surgery.  Current medicines are reviewed at length with the patient today.  The patient does not have concerns regarding medicines.  The following changes have been made:  no  change  Labs/ tests ordered today include:  Orders Placed This Encounter  Procedures  . Lipid panel  . Comprehensive metabolic panel     Disposition:   FU with Collene Massimino C. Oval Linsey, MD, [facility] in 1 year    Signed, Amadeo Coke C. Oval Linsey, MD, [facility]  10/17/2019 5:04 PM    [place]

## 2019-11-14 MED FILL — ESZOPICLONE 3 MG TABS: 3 | 30 days supply | Qty: 30 | Fill #3

## 2020-01-27 ENCOUNTER — Other Ambulatory Visit: Payer: Self-pay | Admitting: Obstetrics & Gynecology

## 2020-01-27 DIAGNOSIS — Z01419 Encounter for gynecological examination (general) (routine) without abnormal findings: Secondary | ICD-10-CM

## 2020-01-27 NOTE — Telephone Encounter (Signed)
Medication refill request: Eszopiclone Last AEX:  04/25/19 Dr. Sabra Heck Next AEX: 07/06/20 Last MMG (if hormonal medication request): n/a Refill authorized: Please advise

## 2020-01-28 MED FILL — ESZOPICLONE 3 MG TABS: 3 | 30 days supply | Qty: 30 | Fill #0

## 2020-02-03 DIAGNOSIS — H5203 Hypermetropia, bilateral: Secondary | ICD-10-CM | POA: Diagnosis not present

## 2020-02-09 DIAGNOSIS — F4312 Post-traumatic stress disorder, chronic: Secondary | ICD-10-CM | POA: Diagnosis not present

## 2020-02-09 DIAGNOSIS — F339 Major depressive disorder, recurrent, unspecified: Secondary | ICD-10-CM | POA: Diagnosis not present

## 2020-02-09 MED FILL — LAMOTRIGINE 25 MG TABS: 25 | 30 days supply | Qty: 54 | Fill #0

## 2020-02-27 DIAGNOSIS — F3181 Bipolar II disorder: Secondary | ICD-10-CM | POA: Diagnosis not present

## 2020-02-27 DIAGNOSIS — F4312 Post-traumatic stress disorder, chronic: Secondary | ICD-10-CM | POA: Diagnosis not present

## 2020-02-27 MED FILL — PRAMIPEXOLE 0.25 MG TABLET: 0.25 | 30 days supply | Qty: 55 | Fill #0

## 2020-02-27 MED FILL — ONDANSETRON HCL 4 MG TABS: 4 | 10 days supply | Qty: 30 | Fill #0

## 2020-02-27 MED FILL — ESZOPICLONE 3 MG TABS: 3 | 30 days supply | Qty: 30 | Fill #1

## 2020-03-05 DIAGNOSIS — L821 Other seborrheic keratosis: Secondary | ICD-10-CM | POA: Diagnosis not present

## 2020-03-05 DIAGNOSIS — D1801 Hemangioma of skin and subcutaneous tissue: Secondary | ICD-10-CM | POA: Diagnosis not present

## 2020-03-05 DIAGNOSIS — D2261 Melanocytic nevi of right upper limb, including shoulder: Secondary | ICD-10-CM | POA: Diagnosis not present

## 2020-03-09 MED FILL — LAMOTRIGINE 25 MG TABS: 25 | 30 days supply | Qty: 54 | Fill #0

## 2020-03-25 ENCOUNTER — Other Ambulatory Visit (HOSPITAL_COMMUNITY): Payer: Self-pay

## 2020-03-25 DIAGNOSIS — F410 Panic disorder [episodic paroxysmal anxiety] without agoraphobia: Secondary | ICD-10-CM | POA: Diagnosis not present

## 2020-03-25 DIAGNOSIS — F3181 Bipolar II disorder: Secondary | ICD-10-CM | POA: Diagnosis not present

## 2020-03-25 MED FILL — LORazepam 0.5 MG TABS: 0.5 | 90 days supply | Qty: 30 | Fill #0

## 2020-03-25 MED FILL — SUBVENITE 150 MG TABS: 150 | 90 days supply | Qty: 90 | Fill #0

## 2020-03-25 MED FILL — AMANTADINE 100 MG CAPSULE: 100 | 30 days supply | Qty: 60 | Fill #0

## 2020-03-29 ENCOUNTER — Other Ambulatory Visit: Payer: Self-pay | Admitting: Obstetrics & Gynecology

## 2020-03-29 DIAGNOSIS — Z01419 Encounter for gynecological examination (general) (routine) without abnormal findings: Secondary | ICD-10-CM

## 2020-03-29 NOTE — Telephone Encounter (Signed)
Medication refill request: Eszopiclone Last AEX:  04/25/19 SM Next AEX: 07/06/20 Last MMG (if hormonal medication request): n/a Refill authorized: today, please advise

## 2020-03-30 MED FILL — ESZOPICLONE 3 MG TABS: 3 | 30 days supply | Qty: 30 | Fill #0

## 2020-04-16 ENCOUNTER — Other Ambulatory Visit (HOSPITAL_COMMUNITY): Payer: Self-pay

## 2020-04-16 DIAGNOSIS — F410 Panic disorder [episodic paroxysmal anxiety] without agoraphobia: Secondary | ICD-10-CM | POA: Diagnosis not present

## 2020-04-16 DIAGNOSIS — F4312 Post-traumatic stress disorder, chronic: Secondary | ICD-10-CM | POA: Diagnosis not present

## 2020-04-16 DIAGNOSIS — F3181 Bipolar II disorder: Secondary | ICD-10-CM | POA: Diagnosis not present

## 2020-04-16 MED FILL — PRAZOSIN HCL 2 MG CAPS: 2 | 30 days supply | Qty: 60 | Fill #0

## 2020-04-18 MED FILL — AMANTADINE 100 MG CAPSULE: 100 | 30 days supply | Qty: 30 | Fill #0

## 2020-05-04 ENCOUNTER — Other Ambulatory Visit (HOSPITAL_COMMUNITY): Payer: Self-pay

## 2020-05-04 MED FILL — AMANTADINE 100 MG CAPSULE: 100 | 30 days supply | Qty: 30 | Fill #0

## 2020-05-04 MED FILL — ESZOPICLONE 3 MG TABS: 3 | 30 days supply | Qty: 30 | Fill #1

## 2020-05-10 MED FILL — PRAZOSIN HCL 2 MG CAPS: 2 | 30 days supply | Qty: 60 | Fill #0

## 2020-05-12 ENCOUNTER — Other Ambulatory Visit: Payer: Self-pay | Admitting: Obstetrics & Gynecology

## 2020-05-12 DIAGNOSIS — Z1231 Encounter for screening mammogram for malignant neoplasm of breast: Secondary | ICD-10-CM

## 2020-05-19 MED FILL — PRAZOSIN HCL 2 MG CAPS: 2 | 30 days supply | Qty: 60 | Fill #0

## 2020-05-21 ENCOUNTER — Ambulatory Visit: Payer: 59 | Admitting: Podiatry

## 2020-05-22 ENCOUNTER — Other Ambulatory Visit (HOSPITAL_COMMUNITY): Payer: Self-pay

## 2020-06-08 ENCOUNTER — Other Ambulatory Visit: Payer: Self-pay | Admitting: Obstetrics & Gynecology

## 2020-06-08 ENCOUNTER — Other Ambulatory Visit: Payer: Self-pay | Admitting: *Deleted

## 2020-06-08 DIAGNOSIS — Z01419 Encounter for gynecological examination (general) (routine) without abnormal findings: Secondary | ICD-10-CM

## 2020-06-08 NOTE — Telephone Encounter (Signed)
Medication refill request: Eszopiclone  Last AEX:  04-25-2019 SM  Next AEX: will schedule at Dr. Ammie Ferrier new clinic  Last MMG (if hormonal medication request): 04-04-2019 density B/BIRADS 1 negative, scheduled 07-09-20 Refill authorized: Today, please advise.   Medication pended for #30, 1RF. Please refill if appropriate.

## 2020-06-10 ENCOUNTER — Other Ambulatory Visit: Payer: Self-pay | Admitting: Obstetrics & Gynecology

## 2020-06-10 DIAGNOSIS — Z01419 Encounter for gynecological examination (general) (routine) without abnormal findings: Secondary | ICD-10-CM

## 2020-06-10 MED ORDER — ESZOPICLONE 3 MG PO TABS: ORAL_TABLET | ORAL | 2 refills | Status: DC

## 2020-06-10 NOTE — Telephone Encounter (Signed)
Refill request pending approval - message left advising pt.   Chimere Klingensmith,CMA

## 2020-06-10 NOTE — Telephone Encounter (Signed)
Patient is calling for a refill. Patient states it was denied bc she does not have an AEX scheduled.

## 2020-06-11 MED FILL — ESZOPICLONE 3 MG TABS: 3 | 30 days supply | Qty: 30 | Fill #0

## 2020-06-17 ENCOUNTER — Other Ambulatory Visit (HOSPITAL_COMMUNITY): Payer: Self-pay

## 2020-06-17 DIAGNOSIS — F4312 Post-traumatic stress disorder, chronic: Secondary | ICD-10-CM | POA: Diagnosis not present

## 2020-06-17 DIAGNOSIS — F410 Panic disorder [episodic paroxysmal anxiety] without agoraphobia: Secondary | ICD-10-CM | POA: Diagnosis not present

## 2020-06-17 DIAGNOSIS — F3181 Bipolar II disorder: Secondary | ICD-10-CM | POA: Diagnosis not present

## 2020-06-18 MED FILL — PRAZOSIN HCL 2 MG CAPS: 2 | 30 days supply | Qty: 60 | Fill #0

## 2020-06-18 MED FILL — GABAPENTIN 100 MG CAPSULE: 100 | 30 days supply | Qty: 90 | Fill #0

## 2020-06-18 MED FILL — AMANTADINE 100 MG CAPSULE: 100 | 30 days supply | Qty: 30 | Fill #0

## 2020-06-24 MED FILL — SUBVENITE 150 MG TABS: 150 | 90 days supply | Qty: 90 | Fill #1

## 2020-07-06 ENCOUNTER — Ambulatory Visit: Payer: 59 | Admitting: Obstetrics & Gynecology

## 2020-07-09 ENCOUNTER — Other Ambulatory Visit: Payer: Self-pay

## 2020-07-09 ENCOUNTER — Ambulatory Visit
Admission: RE | Admit: 2020-07-09 | Discharge: 2020-07-09 | Disposition: A | Discharge status: Home / Self Care | Payer: 59 | Source: Ambulatory Visit | Attending: Obstetrics & Gynecology | Admitting: Obstetrics & Gynecology

## 2020-07-09 ENCOUNTER — Other Ambulatory Visit (HOSPITAL_COMMUNITY): Payer: Self-pay

## 2020-07-09 DIAGNOSIS — Z1231 Encounter for screening mammogram for malignant neoplasm of breast: Secondary | ICD-10-CM | POA: Diagnosis not present

## 2020-07-12 MED FILL — ESZOPICLONE 3 MG TABS: 3 | 30 days supply | Qty: 30 | Fill #1

## 2020-07-14 MED FILL — GABAPENTIN 100 MG CAPSULE: 100 | 30 days supply | Qty: 90 | Fill #0

## 2020-07-14 MED FILL — AMANTADINE 100 MG CAPSULE: 100 | 30 days supply | Qty: 30 | Fill #0

## 2020-07-14 MED FILL — PRAZOSIN HCL 2 MG CAPS: 2 | 30 days supply | Qty: 60 | Fill #0

## 2020-07-27 ENCOUNTER — Other Ambulatory Visit (HOSPITAL_COMMUNITY): Payer: Self-pay

## 2020-07-27 DIAGNOSIS — F410 Panic disorder [episodic paroxysmal anxiety] without agoraphobia: Secondary | ICD-10-CM | POA: Diagnosis not present

## 2020-07-27 DIAGNOSIS — F3181 Bipolar II disorder: Secondary | ICD-10-CM | POA: Diagnosis not present

## 2020-07-27 DIAGNOSIS — F4312 Post-traumatic stress disorder, chronic: Secondary | ICD-10-CM | POA: Diagnosis not present

## 2020-08-16 MED FILL — ESZOPICLONE 3 MG TABS: 3 | 30 days supply | Qty: 30 | Fill #2

## 2020-08-18 MED FILL — GABAPENTIN 100 MG CAPSULE: 100 | 30 days supply | Qty: 90 | Fill #1

## 2020-08-18 MED FILL — AMANTADINE 100 MG CAPSULE: 100 | 30 days supply | Qty: 30 | Fill #0

## 2020-08-18 MED FILL — PRAZOSIN HCL 2 MG CAPS: 2 | 30 days supply | Qty: 60 | Fill #0

## 2020-08-27 ENCOUNTER — Telehealth: Payer: 59 | Admitting: Physician Assistant

## 2020-08-27 DIAGNOSIS — R399 Unspecified symptoms and signs involving the genitourinary system: Secondary | ICD-10-CM

## 2020-08-27 MED ORDER — CEPHALEXIN 500 MG PO CAPS: 500.0000 mg | ORAL_CAPSULE | Freq: Two times a day (BID) | ORAL | 0 refills | Status: AC

## 2020-08-27 NOTE — Progress Notes (Signed)

## 2020-09-14 ENCOUNTER — Other Ambulatory Visit (HOSPITAL_COMMUNITY): Payer: Self-pay

## 2020-09-14 DIAGNOSIS — F410 Panic disorder [episodic paroxysmal anxiety] without agoraphobia: Secondary | ICD-10-CM | POA: Diagnosis not present

## 2020-09-14 DIAGNOSIS — F4312 Post-traumatic stress disorder, chronic: Secondary | ICD-10-CM | POA: Diagnosis not present

## 2020-09-14 DIAGNOSIS — F3181 Bipolar II disorder: Secondary | ICD-10-CM | POA: Diagnosis not present

## 2020-09-14 MED FILL — PRAZOSIN HCL 2 MG CAPS: 2 | 30 days supply | Qty: 60 | Fill #0

## 2020-09-14 MED FILL — ESZOPICLONE 3 MG TABS: 3 | 30 days supply | Qty: 30 | Fill #0

## 2020-09-14 MED FILL — GABAPENTIN 100 MG CAPSULE: 100 | 30 days supply | Qty: 90 | Fill #0

## 2020-09-14 MED FILL — SUBVENITE 150 MG TABS: 150 | 90 days supply | Qty: 90 | Fill #0

## 2020-09-14 MED FILL — AMANTADINE 100 MG CAPSULE: 100 | 30 days supply | Qty: 30 | Fill #0

## 2020-10-14 MED FILL — ESZOPICLONE 3 MG TABS: 3 | 30 days supply | Qty: 30 | Fill #1

## 2020-10-26 MED FILL — GABAPENTIN 100 MG CAPSULE: 100 | 30 days supply | Qty: 90 | Fill #1

## 2020-11-16 ENCOUNTER — Other Ambulatory Visit (HOSPITAL_COMMUNITY): Payer: Self-pay

## 2020-11-16 MED FILL — Eszopiclone Tab 3 MG: ORAL | 30 days supply | Qty: 30 | Fill #0 | Status: AC

## 2020-11-16 MED FILL — Amantadine HCl Cap 100 MG: ORAL | 30 days supply | Qty: 30 | Fill #0 | Status: AC

## 2020-11-17 ENCOUNTER — Other Ambulatory Visit (HOSPITAL_COMMUNITY): Payer: Self-pay

## 2020-11-18 ENCOUNTER — Other Ambulatory Visit (HOSPITAL_COMMUNITY): Payer: Self-pay

## 2020-11-23 DIAGNOSIS — S0502XA Injury of conjunctiva and corneal abrasion without foreign body, left eye, initial encounter: Secondary | ICD-10-CM | POA: Diagnosis not present

## 2020-11-25 ENCOUNTER — Other Ambulatory Visit (HOSPITAL_COMMUNITY): Payer: Self-pay

## 2020-11-25 MED FILL — Gabapentin Cap 100 MG: ORAL | 30 days supply | Qty: 90 | Fill #0 | Status: AC

## 2020-11-25 MED FILL — Prazosin HCl Cap 2 MG: ORAL | 30 days supply | Qty: 60 | Fill #0 | Status: AC

## 2020-11-26 ENCOUNTER — Encounter: Payer: Self-pay | Admitting: Gastroenterology

## 2020-11-26 ENCOUNTER — Other Ambulatory Visit (HOSPITAL_COMMUNITY): Payer: Self-pay

## 2020-11-26 ENCOUNTER — Ambulatory Visit: Payer: 59 | Admitting: Gastroenterology

## 2020-11-26 VITALS — BP 122/66 | HR 70 | Ht 66.5 in | Wt 173.0 lb

## 2020-11-26 DIAGNOSIS — K5900 Constipation, unspecified: Secondary | ICD-10-CM

## 2020-11-26 DIAGNOSIS — K219 Gastro-esophageal reflux disease without esophagitis: Secondary | ICD-10-CM

## 2020-11-26 MED ORDER — FAMOTIDINE 20 MG PO TABS
20.0000 mg | ORAL_TABLET | Freq: Every day | ORAL | 11 refills | Status: DC
Start: 2020-11-26 — End: 2021-12-22
  Filled 2020-11-26: qty 30, 30d supply, fill #0
  Filled 2020-12-20 – 2020-12-30 (×2): qty 30, 30d supply, fill #1
  Filled 2021-01-23: qty 30, 30d supply, fill #2
  Filled 2021-02-28: qty 30, 30d supply, fill #3
  Filled 2021-04-01: qty 30, 30d supply, fill #4
  Filled 2021-04-26: qty 30, 30d supply, fill #0
  Filled 2021-06-04: qty 30, 30d supply, fill #1
  Filled 2021-07-09: qty 30, 30d supply, fill #2
  Filled 2021-07-25 – 2021-08-02 (×3): qty 30, 30d supply, fill #3
  Filled 2021-09-08: qty 30, 30d supply, fill #4
  Filled 2021-10-18: qty 30, 30d supply, fill #5
  Filled 2021-11-17: qty 30, 30d supply, fill #6

## 2020-11-26 NOTE — Patient Instructions (Signed)
Start taking your Nexium 20-30 minutes before the first meal of the day and the last meal of the day.   We have sent the following medications to your pharmacy for you to pick up at your convenience: famotidine 20 mg taking one tablet by mouth at bedtime.  Start over the counter Miralax one dose daily.   Follow up with Dr. Ardis Hughs in 3 months.

## 2020-11-26 NOTE — Progress Notes (Signed)
Review of pertinent gastrointestinal problems: 1. Routine risk for colon cancer: colonoscopy in [place] in 2006 which was normal with no polyps. She had a surveillance colonoscopy by Dr. Thana Farr in 4270 which was complicated by a colonic perforation. colonoscopy by Dr. Ardis Hughs on 08/18/2014 the anus the Hartman's pouch was evaluated and was completely normal-appearing. Via ostomy the remaining colon examined was normal. 2 polyps were found removed and sent to pathology (hyperplastic); recommended repeat screening exam 08/2024. 2. Colon perforated by Dr. Earlean Shawl during colonoscopy 2015: Following the perforation she had a segmental resection, colostomy, and required a wound VAC while in the hospital. colostomy takedown Dr. [name] 01/2375 3. Reflux esophagitis  EGD DR. Ardis Hughs on 08/18/2014. There was a typical-appearing reflux related short segment ulcerative esophagitis and a 3 cm hiatal hernia. BID PPI recommended.   HPI: This is a pleasant 70 year old woman whom I last saw here about 5 years ago.   Last saw her here in the office about 5 years ago.  For constipation nausea early satiety.  I recommended she get back on MiraLAX which seem to have been helping her already previously.  She was not taking her proton pump in.  At the correct time in relation to meals and so she is going to change that.  She is going to continue taking her H2 blocker at bedtime every night and I gave her prescription for Zofran.  I had hoped that she would start feeling better with time and a little more recovery from her colostomy takedown.  She was to see me back in the office 3 months later however we have not heard from her since then.  She is again not taking her proton pump inhibitor at the correct time in relation to her meals.  She is taking her Nexium first thing in the morning and she generally eats her first meal around lunch or even later.  Her second pill is about 2 hours before dinner.  She does not take H2  blocker at night anymore.  She is also not taking MiraLAX on a daily basis.  She is very bothered by GERD rather chronically, daily.  She does not have dysphagia and she has not been losing weight.  She has nocturnal waterbrash and heartburn throughout the day several times.  She struggles to move her bowels with constipation, she does not take any anticonstipation medicines on a regular basis.    Review of systems: Pertinent positive and negative review of systems were noted in the above HPI section. All other review negative.   Past Medical History:  Diagnosis Date  . Anxiety   . Complication of anesthesia    past hx. laryngospasm following 2 past surgeries, not recent  . Depression   . Exertional dyspnea 08/04/2019  . GERD (gastroesophageal reflux disease)   . Hiatal hernia 10/17/2019  . Hyperlipidemia   . Insomnia    periodically uses med.  . Perforated sigmoid colon ([place]) 04/28/14   during colonoscopy, "rupture colon"became septic, has colostomy and open surgical wound  . Prediabetes 08/09/2017  . Suicide attempt ([place])   . Vitamin D deficiency     Past Surgical History:  Procedure Laterality Date  . ABDOMINAL HYSTERECTOMY  1999   TAH/BSO  . APPLICATION OF WOUND VAC  04/28/2014   Procedure: APPLICATION OF WOUND VAC;  Surgeon: Rolm Bookbinder, MD;  Location: [place];  Service: General;;  . BLEPHAROPLASTY  2010  . BREAST SURGERY  2001   Breast reduction  . COLOSTOMY  N/A 04/28/2014   Procedure: COLOSTOMY;  Surgeon: Rolm Bookbinder, MD;  Location: [place];  Service: General;  Laterality: N/A;  . COLOSTOMY REVISION N/A 04/28/2014   Procedure: COLON RESECTION SIGMOID;  Surgeon: Rolm Bookbinder, MD;  Location: [place];  Service: General;  Laterality: N/A;  . COLOSTOMY TAKEDOWN N/A 09/02/2014   Procedure: LAPAROSCOPIC COLOSTOMY REVERSAL;  Surgeon: [name], MD;  Location: [facility];  Service: General;  Laterality: N/A;  . COSMETIC SURGERY    . DEBRIDEMENT TENNIS ELBOW    .  DILATION AND CURETTAGE OF UTERUS     x3, hysteroscopy  . ELBOW SURGERY  2007  . EYE SURGERY     Blepharoplasty  . LAPAROTOMY N/A 04/28/2014   Procedure: EXPLORATORY LAPAROTOMY,SIGMOID COLECTOMY;  Surgeon: Rolm Bookbinder, MD;  Location: [place];  Service: General;  Laterality: N/A;  . REDUCTION MAMMAPLASTY Bilateral   . scar and adhesion repair  06/01/15   and liposuction  . ULNAR TUNNEL RELEASE Left 2010    Current Outpatient Medications  Medication Sig Dispense Refill  . amantadine (SYMMETREL) 100 MG capsule TAKE 1 CAPSULE BY MOUTH EVERY MORNING 30 capsule 2  . esomeprazole (NEXIUM) 40 MG capsule TAKE 1 CAPSULE BY MOUTH ONCE DAILY BEFORE BREAKFAST 30 capsule 1  . Eszopiclone 3 MG TABS TAKE 1 TABLET BY MOUTH AT BEDTIME 30 tablet 2  . gabapentin (NEURONTIN) 100 MG capsule TAKE 1 TO 3 CAPSULES BY MOUTH AT BEDTIME AS NEEDED FOR INSOMNIA 90 capsule 2  . ibuprofen (ADVIL,MOTRIN) 200 MG tablet Take 800 mg by mouth every 6 (six) hours as needed for mild pain or moderate pain.    Marland Kitchen lamoTRIgine (LAMICTAL) 150 MG tablet TAKE 1 TABLET BY MOUTH ONCE A DAY * IF OUT OF SUBVENITE MORE THAN 1 WEEK DO NO RESTART CALL [phone] 90 tablet 0  . prazosin (MINIPRESS) 2 MG capsule TAKE 2 CAPSULES BY MOUTH AT BEDTIME 60 capsule 2   No current facility-administered medications for this visit.    Allergies as of 11/26/2020 - Review Complete 11/26/2020  Allergen Reaction Noted  . Bee venom  05/06/2013  . Statins Other (See Comments) 10/18/2012    Family History  Problem Relation Age of Onset  . Hyperlipidemia Father   . Hypertension Father   . Diabetes Father   . Cancer Father        History of bladder cancer  . Heart disease Father   . Other Father        4 ft intestinal removed-had gangrene in gallbladder, then spread to intestines  . Heart attack Father   . Hypertension Mother   . Diabetes Mother   . Heart disease Maternal Grandmother   . Stroke Maternal Grandfather   . Hyperlipidemia  Brother   . Cancer Paternal Grandmother        uterine  . Cancer Paternal Grandfather        pancreatic cancer  . Esophageal cancer Sister     Social History   Socioeconomic History  . Marital status: Divorced    Spouse name: Not on file  . Number of children: Not on file  . Years of education: Not on file  . Highest education level: Not on file  Occupational History  . Not on file  Tobacco Use  . Smoking status: Former Smoker    Packs/day: 0.50    Types: Cigarettes    Quit date: 09/01/2003    Years since quitting: 17.2  . Smokeless tobacco: Never Used  . Tobacco comment: occasional  Vaping Use  . Vaping Use: Every day  . Substances: Nicotine  Substance and Sexual Activity  . Alcohol use: Yes    Alcohol/week: 10.0 standard drinks    Types: 10 Cans of beer per week  . Drug use: No  . Sexual activity: Not Currently    Birth control/protection: Surgical    Comment: TAH/BSO  Other Topics Concern  . Not on file  Social History Narrative   College Degree   Registered Nurse   Exercises      Social Determinants of Health   Financial Resource Strain: Not on file  Food Insecurity: Not on file  Transportation Needs: Not on file  Physical Activity: Not on file  Stress: Not on file  Social Connections: Not on file  Intimate Partner Violence: Not on file     Physical Exam: BP 122/66   Pulse 70   Ht 5' 6.5" (1.689 m)   Wt 173 lb (78.5 kg)   LMP 08/07/1998   BMI 27.50 kg/m  Constitutional: generally well-appearing Psychiatric: alert and oriented x3 Eyes: extraocular movements intact Mouth: oral pharynx moist, no lesions Neck: supple no lymphadenopathy Cardiovascular: heart regular rate and rhythm Lungs: clear to auscultation bilaterally Abdomen: soft, nontender, nondistended, no obvious ascites, no peritoneal signs, normal bowel sounds Extremities: no lower extremity edema bilaterally Skin: no lesions on visible extremities   Assessment and plan: 70 y.o.  female with chronic GERD without alarm symptoms, chronic constipation  She is again not taking her antiacid medicines at the correct time in relation to meals or sleeping.  We will instruct her to start taking her Nexium 20 to 30 minutes prior to her first meal the day, 20 to 30 minutes prior to her last meal the day.  She will also start taking famotidine 20 mg 1 pill at bedtime every night.  She is going to stop taking Alka-Seltzer for her heartburn.  She is going to start or resume taking MiraLAX 1 dose every single day for her chronic constipation  She will return to see me in 3 months and sooner if needed.    Please see the "Patient Instructions" section for addition details about the plan.   Owens Loffler, MD [facility] 11/26/2020, 10:38 AM  Cc: Denita Lung, MD  Total time on date of encounter was 45  minutes (this included time spent preparing to see the patient reviewing records; obtaining and/or reviewing separately obtained history; performing a medically appropriate exam and/or evaluation; counseling and educating the patient and family if present; ordering medications, tests or procedures if applicable; and documenting clinical information in the health record).

## 2020-12-01 ENCOUNTER — Ambulatory Visit (INDEPENDENT_AMBULATORY_CARE_PROVIDER_SITE_OTHER): Payer: 59 | Admitting: Obstetrics & Gynecology

## 2020-12-01 ENCOUNTER — Encounter (HOSPITAL_BASED_OUTPATIENT_CLINIC_OR_DEPARTMENT_OTHER): Payer: Self-pay | Admitting: Obstetrics & Gynecology

## 2020-12-01 ENCOUNTER — Other Ambulatory Visit: Payer: Self-pay

## 2020-12-01 VITALS — BP 139/66 | HR 96 | Ht 65.75 in | Wt 174.0 lb

## 2020-12-01 DIAGNOSIS — B009 Herpesviral infection, unspecified: Secondary | ICD-10-CM | POA: Diagnosis not present

## 2020-12-01 DIAGNOSIS — Z8639 Personal history of other endocrine, nutritional and metabolic disease: Secondary | ICD-10-CM

## 2020-12-01 DIAGNOSIS — Z8659 Personal history of other mental and behavioral disorders: Secondary | ICD-10-CM | POA: Diagnosis not present

## 2020-12-01 DIAGNOSIS — Z780 Asymptomatic menopausal state: Secondary | ICD-10-CM

## 2020-12-01 DIAGNOSIS — F5104 Psychophysiologic insomnia: Secondary | ICD-10-CM | POA: Diagnosis not present

## 2020-12-01 DIAGNOSIS — R7309 Other abnormal glucose: Secondary | ICD-10-CM

## 2020-12-01 DIAGNOSIS — Z90710 Acquired absence of both cervix and uterus: Secondary | ICD-10-CM

## 2020-12-01 DIAGNOSIS — D509 Iron deficiency anemia, unspecified: Secondary | ICD-10-CM

## 2020-12-01 DIAGNOSIS — Z0000 Encounter for general adult medical examination without abnormal findings: Secondary | ICD-10-CM

## 2020-12-01 DIAGNOSIS — E559 Vitamin D deficiency, unspecified: Secondary | ICD-10-CM

## 2020-12-01 DIAGNOSIS — Z01419 Encounter for gynecological examination (general) (routine) without abnormal findings: Secondary | ICD-10-CM | POA: Diagnosis not present

## 2020-12-01 NOTE — Progress Notes (Signed)
70 y.o. G40P2 Divorced White or Caucasian female here for annual exam.  Still working.  Had first grandson last fall.  Daughter live in [place].    Denies vaginal bleeding.    Patient's last menstrual period was 08/07/1998.          Sexually active: No.  The current method of family planning is status post hysterectomy.    Exercising: No.   Smoker:  no  Health Maintenance: Pap:  TAH/BSO 1999 History of abnormal Pap:  no MMG:  12/21 Colonoscopy:  2016, Dr. Ardis Hughs.   BMD:   -1.1, 2019 TDaP:  2019 Pneumonia vaccine(s):  2019 and 2020 Shingrix:   completed Hep C testing: 2018 Screening Labs: orders placed for her to return for fasting lab work   reports that she quit smoking about 17 years ago. Her smoking use included cigarettes. She smoked 0.50 packs per day. She has never used smokeless tobacco. She reports current alcohol use of about 10.0 standard drinks of alcohol per week. She reports that she does not use drugs.  Past Medical History:  Diagnosis Date  . Anxiety   . Complication of anesthesia    past hx. laryngospasm following 2 past surgeries, not recent  . Depression   . Exertional dyspnea 08/04/2019  . GERD (gastroesophageal reflux disease)   . Hiatal hernia 10/17/2019  . Hyperlipidemia   . Insomnia    periodically uses med.  . Perforated sigmoid colon ([place]) 04/28/14   during colonoscopy, "rupture colon"became septic, has colostomy and open surgical wound  . Prediabetes 08/09/2017  . Suicide attempt ([place])   . Vitamin D deficiency     Past Surgical History:  Procedure Laterality Date  . ABDOMINAL HYSTERECTOMY  1999   TAH/BSO  . APPLICATION OF WOUND VAC  04/28/2014   Procedure: APPLICATION OF WOUND VAC;  Surgeon: Rolm Bookbinder, MD;  Location: [place];  Service: General;;  . BLEPHAROPLASTY  2010  . BREAST SURGERY  2001   Breast reduction  . COLOSTOMY N/A 04/28/2014   Procedure: COLOSTOMY;  Surgeon: Rolm Bookbinder, MD;  Location: [place];  Service: General;   Laterality: N/A;  . COLOSTOMY REVISION N/A 04/28/2014   Procedure: COLON RESECTION SIGMOID;  Surgeon: Rolm Bookbinder, MD;  Location: [place];  Service: General;  Laterality: N/A;  . COLOSTOMY TAKEDOWN N/A 09/02/2014   Procedure: LAPAROSCOPIC COLOSTOMY REVERSAL;  Surgeon: [name], MD;  [facility];  Service: General;  Laterality: N/A;  . COSMETIC SURGERY    . DEBRIDEMENT TENNIS ELBOW    . DILATION AND CURETTAGE OF UTERUS     x3, hysteroscopy  . ELBOW SURGERY  2007  . EYE SURGERY     Blepharoplasty  . LAPAROTOMY N/A 04/28/2014   Procedure: EXPLORATORY LAPAROTOMY,SIGMOID COLECTOMY;  Surgeon: Rolm Bookbinder, MD;  Location: [place];  Service: General;  Laterality: N/A;  . REDUCTION MAMMAPLASTY Bilateral   . scar and adhesion repair  06/01/15   and liposuction  . ULNAR TUNNEL RELEASE Left 2010    Current Outpatient Medications  Medication Sig Dispense Refill  . amantadine (SYMMETREL) 100 MG capsule TAKE 1 CAPSULE BY MOUTH EVERY MORNING 30 capsule 2  . esomeprazole (NEXIUM) 40 MG capsule TAKE 1 CAPSULE BY MOUTH ONCE DAILY BEFORE BREAKFAST 30 capsule 1  . Eszopiclone 3 MG TABS TAKE 1 TABLET BY MOUTH AT BEDTIME 30 tablet 2  . famotidine (PEPCID) 20 MG tablet Take 1 tablet (20 mg total) by mouth at bedtime. 30 tablet 11  . gabapentin (NEURONTIN) 100 MG  capsule TAKE 1 TO 3 CAPSULES BY MOUTH AT BEDTIME AS NEEDED FOR INSOMNIA 90 capsule 2  . ibuprofen (ADVIL,MOTRIN) 200 MG tablet Take 800 mg by mouth every 6 (six) hours as needed for mild pain or moderate pain.    Marland Kitchen lamoTRIgine (LAMICTAL) 150 MG tablet TAKE 1 TABLET BY MOUTH ONCE A DAY * IF OUT OF SUBVENITE MORE THAN 1 WEEK DO NO RESTART CALL [phone] 90 tablet 0  . prazosin (MINIPRESS) 2 MG capsule TAKE 2 CAPSULES BY MOUTH AT BEDTIME 60 capsule 2   No current facility-administered medications for this visit.    Family History  Problem Relation Age of Onset  . Hyperlipidemia Father   . Hypertension Father   . Diabetes Father    . Cancer Father        History of bladder cancer  . Heart disease Father   . Other Father        4 ft intestinal removed-had gangrene in gallbladder, then spread to intestines  . Heart attack Father   . Hypertension Mother   . Diabetes Mother   . Heart disease Maternal Grandmother   . Stroke Maternal Grandfather   . Hyperlipidemia Brother   . Cancer Paternal Grandmother        uterine  . Cancer Paternal Grandfather        pancreatic cancer  . Esophageal cancer Sister     Review of Systems  Exam:   BP 139/66   Pulse 96   Ht 5' 5.75" (1.67 m)   Wt 174 lb (78.9 kg)   LMP 08/07/1998   BMI 28.30 kg/m   Height: 5' 5.75" (167 cm)  General appearance: alert, cooperative and appears stated age Head: Normocephalic, without obvious abnormality, atraumatic Neck: no adenopathy, supple, symmetrical, trachea midline and thyroid normal to inspection and palpation Lungs: clear to auscultation bilaterally Breasts: normal appearance, no masses or tenderness Heart: regular rate and rhythm Abdomen: soft, non-tender; bowel sounds normal; no masses,  no organomegaly Extremities: extremities normal, atraumatic, no cyanosis or edema Skin: Skin color, texture, turgor normal. No rashes or lesions Lymph nodes: Cervical, supraclavicular, and axillary nodes normal. No abnormal inguinal nodes palpated Neurologic: Grossly normal   Pelvic: External genitalia:  no lesions              Urethra:  normal appearing urethra with no masses, tenderness or lesions              Bartholins and Skenes: normal                 Vagina: normal appearing vagina with normal color and no discharge, no lesions              Cervix: absent              Pap taken: No. Bimanual Exam:  Uterus:  uterus absent              Adnexa: no mass, fullness, tenderness               Rectovaginal: Confirms               Anus:  normal sphincter tone, no lesions  Chaperone, Prince Rome, CMA, was present for  exam.  Assessment/Plan: 1. Well woman exam with routine gynecological exam - Pap not indicated - MMG 12/21 - colonoscopy 2016 with colon perforation.  Follow up 10 years.  Pt unlikely to do one at that time due to prior complication - BMD 5188 with  mild osteopenia - vaccines updated  2. Blood tests for routine general physical examination - Lipid panel; Future - TSH; Future - Comprehensive metabolic panel; Future - CBC with Differential/Platelet; Future  3. Vitamin D deficiency - VITAMIN D 25 Hydroxy (Vit-D Deficiency, Fractures); Future  4. Postmenopausal - no HRT  5. H/O abdominal hysterectomy/USO 1999  6. History of depression - follow by psychiatry  7. HSV-1 infection  8. Chronic insomnia

## 2020-12-02 DIAGNOSIS — B009 Herpesviral infection, unspecified: Secondary | ICD-10-CM | POA: Insufficient documentation

## 2020-12-02 DIAGNOSIS — Z780 Asymptomatic menopausal state: Secondary | ICD-10-CM | POA: Insufficient documentation

## 2020-12-02 DIAGNOSIS — E559 Vitamin D deficiency, unspecified: Secondary | ICD-10-CM | POA: Insufficient documentation

## 2020-12-02 DIAGNOSIS — F5104 Psychophysiologic insomnia: Secondary | ICD-10-CM | POA: Insufficient documentation

## 2020-12-02 DIAGNOSIS — Z90710 Acquired absence of both cervix and uterus: Secondary | ICD-10-CM | POA: Insufficient documentation

## 2020-12-03 ENCOUNTER — Other Ambulatory Visit: Payer: Self-pay

## 2020-12-03 ENCOUNTER — Other Ambulatory Visit (HOSPITAL_BASED_OUTPATIENT_CLINIC_OR_DEPARTMENT_OTHER)
Admission: RE | Admit: 2020-12-03 | Discharge: 2020-12-03 | Disposition: A | Discharge status: Home / Self Care | Payer: 59 | Source: Ambulatory Visit | Attending: Obstetrics & Gynecology | Admitting: Obstetrics & Gynecology

## 2020-12-03 DIAGNOSIS — E559 Vitamin D deficiency, unspecified: Secondary | ICD-10-CM | POA: Diagnosis not present

## 2020-12-03 DIAGNOSIS — Z0000 Encounter for general adult medical examination without abnormal findings: Secondary | ICD-10-CM | POA: Insufficient documentation

## 2020-12-03 LAB — CBC WITH DIFFERENTIAL/PLATELET
Abs Immature Granulocytes: 0.01 10*3/uL (ref 0.00–0.07)
Basophils Absolute: 0 10*3/uL (ref 0.0–0.1)
Basophils Relative: 1 %
Eosinophils Absolute: 0.2 10*3/uL (ref 0.0–0.5)
Eosinophils Relative: 4 %
HCT: 32.9 % — ABNORMAL LOW (ref 36.0–46.0)
Hemoglobin: 10.2 g/dL — ABNORMAL LOW (ref 12.0–15.0)
Immature Granulocytes: 0 %
Lymphocytes Relative: 34 %
Lymphs Abs: 1.7 10*3/uL (ref 0.7–4.0)
MCH: 24.5 pg — ABNORMAL LOW (ref 26.0–34.0)
MCHC: 31 g/dL (ref 30.0–36.0)
MCV: 78.9 fL — ABNORMAL LOW (ref 80.0–100.0)
Monocytes Absolute: 0.4 10*3/uL (ref 0.1–1.0)
Monocytes Relative: 7 %
Neutro Abs: 2.8 10*3/uL (ref 1.7–7.7)
Neutrophils Relative %: 54 %
Platelets: 351 10*3/uL (ref 150–400)
RBC: 4.17 MIL/uL (ref 3.87–5.11)
RDW: 17.2 % — ABNORMAL HIGH (ref 11.5–15.5)
WBC: 5.1 10*3/uL (ref 4.0–10.5)
nRBC: 0 % (ref 0.0–0.2)

## 2020-12-03 LAB — LIPID PANEL
Cholesterol: 298 mg/dL — ABNORMAL HIGH (ref 0–200)
HDL: 76 mg/dL (ref >40)
LDL Cholesterol: 178 mg/dL — ABNORMAL HIGH (ref 0–99)
Total CHOL/HDL Ratio: 3.9 RATIO
Triglycerides: 218 mg/dL — ABNORMAL HIGH (ref <150)
VLDL: 44 mg/dL — ABNORMAL HIGH (ref 0–40)

## 2020-12-03 LAB — COMPREHENSIVE METABOLIC PANEL
ALT: 12 U/L (ref 0–44)
AST: 18 U/L (ref 15–41)
Albumin: 4.5 g/dL (ref 3.5–5.0)
Alkaline Phosphatase: 51 U/L (ref 38–126)
Anion gap: 10 (ref 5–15)
BUN: 14 mg/dL (ref 8–23)
CO2: 25 mmol/L (ref 22–32)
Calcium: 9.4 mg/dL (ref 8.9–10.3)
Chloride: 103 mmol/L (ref 98–111)
Creatinine, Ser: 1 mg/dL (ref 0.44–1.00)
GFR, Estimated: >60 mL/min (ref >60)
Glucose, Bld: 137 mg/dL — ABNORMAL HIGH (ref 70–99)
Potassium: 3.8 mmol/L (ref 3.5–5.1)
Sodium: 138 mmol/L (ref 135–145)
Total Bilirubin: 0.5 mg/dL (ref 0.3–1.2)
Total Protein: 6.8 g/dL (ref 6.5–8.1)

## 2020-12-03 LAB — TSH: TSH: 4.341 u[IU]/mL (ref 0.350–4.500)

## 2020-12-03 LAB — VITAMIN D 25 HYDROXY (VIT D DEFICIENCY, FRACTURES): Vit D, 25-Hydroxy: 12.39 ng/mL — ABNORMAL LOW (ref 30–100)

## 2020-12-06 ENCOUNTER — Encounter (HOSPITAL_BASED_OUTPATIENT_CLINIC_OR_DEPARTMENT_OTHER): Payer: Self-pay

## 2020-12-07 NOTE — Telephone Encounter (Signed)
-----   Message from Megan Salon, MD sent at 12/06/2020  5:05 PM EDT ----- Please call pt.  Let her know her TSH was normal.  Cholesterol is elevated at 298.  LDLs 178.  Dr. Oval Linsey recommended cholesterol medication.  She probably does need this.  Does she want to follow up with Dr. Oval Linsey or [name]?  Her Vit D was 12.  Needs prescription Vit D 50,000 IU weekly x 12 weeks.  I would recommend repeating the level in 12 weeks.  Her metabolic panel was fine but glucose was 137.  Will see if can add on HbA1C.  CBC showed she will has some anemia.  Also going to see if can add on iron level to blood work last week.  Will update you once I know.

## 2020-12-07 NOTE — Telephone Encounter (Signed)
LMOM at 2:44 for patient to call the office to receive lab results and recommendations. tbw

## 2020-12-08 ENCOUNTER — Other Ambulatory Visit (HOSPITAL_COMMUNITY): Payer: Self-pay

## 2020-12-08 ENCOUNTER — Telehealth: Payer: Self-pay | Admitting: Cardiovascular Disease

## 2020-12-08 ENCOUNTER — Telehealth (HOSPITAL_BASED_OUTPATIENT_CLINIC_OR_DEPARTMENT_OTHER): Payer: Self-pay

## 2020-12-08 ENCOUNTER — Other Ambulatory Visit (HOSPITAL_BASED_OUTPATIENT_CLINIC_OR_DEPARTMENT_OTHER): Payer: Self-pay | Admitting: Obstetrics & Gynecology

## 2020-12-08 DIAGNOSIS — E559 Vitamin D deficiency, unspecified: Secondary | ICD-10-CM

## 2020-12-08 DIAGNOSIS — Z5181 Encounter for therapeutic drug level monitoring: Secondary | ICD-10-CM

## 2020-12-08 DIAGNOSIS — E785 Hyperlipidemia, unspecified: Secondary | ICD-10-CM

## 2020-12-08 MED ORDER — LAMOTRIGINE 150 MG PO TABS
ORAL_TABLET | ORAL | 0 refills | Status: DC
  Filled 2020-12-08: qty 90, 90d supply, fill #0

## 2020-12-08 MED ORDER — VITAMIN D (ERGOCALCIFEROL) 1.25 MG (50000 UNIT) PO CAPS
50000.0000 [IU] | ORAL_CAPSULE | ORAL | 0 refills | Status: DC
  Filled 2020-12-08: qty 12, 84d supply, fill #0

## 2020-12-08 NOTE — Telephone Encounter (Signed)
Left message to call back  

## 2020-12-08 NOTE — Telephone Encounter (Signed)
PT is calling requesting Dr.Bolton prescribe her a cholestorol medication.Please Advise

## 2020-12-08 NOTE — Telephone Encounter (Signed)
Spoke with patient. She verified that she would like her medication sent into the [facility]. tbw

## 2020-12-09 ENCOUNTER — Other Ambulatory Visit (HOSPITAL_COMMUNITY): Payer: Self-pay

## 2020-12-09 MED ORDER — ROSUVASTATIN CALCIUM 5 MG PO TABS: ORAL_TABLET | ORAL | 3 refills | Status: DC

## 2020-12-09 NOTE — Telephone Encounter (Signed)
Dr Oval Linsey reviewed note and agreeable with plan

## 2020-12-09 NOTE — Telephone Encounter (Signed)
Patient was returning phone call 

## 2020-12-09 NOTE — Telephone Encounter (Signed)
Spoke with patient regarding cholesterol medication and labs which were done April 2022. She asked that Dr Oval Linsey prescribe cholesterol medication. Per patient she never started Rosuvastatin secondary to having issues with Atorvastatin. Discussed potential side effects with one statin vs another and she is willing to try. Rx for Rosuvastatin 5 mg 3 days a week, ok to increase as tolerated sent to [facility]. Will have repeat labs in 3 months, scheduled follow up with Dr Oval Linsey

## 2020-12-09 NOTE — Addendum Note (Signed)
Addended by: Megan Salon on: 12/09/2020 06:49 AM   Modules accepted: Orders

## 2020-12-10 ENCOUNTER — Telehealth (HOSPITAL_BASED_OUTPATIENT_CLINIC_OR_DEPARTMENT_OTHER): Payer: Self-pay

## 2020-12-10 NOTE — Telephone Encounter (Signed)
-----   Message from Megan Salon, MD sent at 12/09/2020  6:49 AM EDT ----- I could not add on the HbA1C or the iron levels so I've ordered them as future labs.  Pt can return at anytime for this.  She needs to be placed on the lab schedule for the date and time she wants to come.  Thanks.

## 2020-12-17 ENCOUNTER — Other Ambulatory Visit (HOSPITAL_BASED_OUTPATIENT_CLINIC_OR_DEPARTMENT_OTHER)
Admission: RE | Admit: 2020-12-17 | Discharge: 2020-12-17 | Disposition: A | Discharge status: Home / Self Care | Payer: 59 | Source: Ambulatory Visit | Attending: Family Medicine | Admitting: Family Medicine

## 2020-12-17 ENCOUNTER — Other Ambulatory Visit: Payer: Self-pay

## 2020-12-17 DIAGNOSIS — E559 Vitamin D deficiency, unspecified: Secondary | ICD-10-CM | POA: Insufficient documentation

## 2020-12-17 DIAGNOSIS — R7309 Other abnormal glucose: Secondary | ICD-10-CM | POA: Diagnosis not present

## 2020-12-17 DIAGNOSIS — D509 Iron deficiency anemia, unspecified: Secondary | ICD-10-CM | POA: Insufficient documentation

## 2020-12-17 LAB — HEMOGLOBIN A1C
Hgb A1c MFr Bld: 6.1 % — ABNORMAL HIGH (ref 4.8–5.6)
Mean Plasma Glucose: 128.37 mg/dL

## 2020-12-17 LAB — FERRITIN: Ferritin: 15 ng/mL (ref 11–307)

## 2020-12-17 LAB — IRON: Iron: 32 ug/dL (ref 28–170)

## 2020-12-17 LAB — VITAMIN D 25 HYDROXY (VIT D DEFICIENCY, FRACTURES): Vit D, 25-Hydroxy: 30.87 ng/mL (ref 30–100)

## 2020-12-20 ENCOUNTER — Other Ambulatory Visit (HOSPITAL_COMMUNITY): Payer: Self-pay

## 2020-12-20 MED FILL — Amantadine HCl Cap 100 MG: ORAL | 30 days supply | Qty: 30 | Fill #1 | Status: CN

## 2020-12-21 ENCOUNTER — Other Ambulatory Visit (HOSPITAL_COMMUNITY): Payer: Self-pay

## 2020-12-22 ENCOUNTER — Other Ambulatory Visit (HOSPITAL_COMMUNITY): Payer: Self-pay

## 2020-12-23 ENCOUNTER — Other Ambulatory Visit (HOSPITAL_COMMUNITY): Payer: Self-pay

## 2020-12-27 ENCOUNTER — Other Ambulatory Visit (HOSPITAL_COMMUNITY): Payer: Self-pay

## 2020-12-28 ENCOUNTER — Other Ambulatory Visit (HOSPITAL_COMMUNITY): Payer: Self-pay

## 2020-12-30 ENCOUNTER — Telehealth: Payer: Self-pay | Admitting: Cardiovascular Disease

## 2020-12-30 ENCOUNTER — Other Ambulatory Visit (HOSPITAL_COMMUNITY): Payer: Self-pay

## 2020-12-30 DIAGNOSIS — F332 Major depressive disorder, recurrent severe without psychotic features: Secondary | ICD-10-CM | POA: Diagnosis not present

## 2020-12-30 MED ORDER — LAMOTRIGINE 100 MG PO TABS
100.0000 mg | ORAL_TABLET | Freq: Every day | ORAL | 2 refills | Status: DC
  Filled 2020-12-30: qty 30, 30d supply, fill #0
  Filled 2021-01-25: qty 30, 30d supply, fill #1

## 2020-12-30 MED ORDER — PRAZOSIN HCL 2 MG PO CAPS
2.0000 mg | ORAL_CAPSULE | Freq: Every evening | ORAL | 2 refills | Status: DC
  Filled 2020-12-30: qty 30, 30d supply, fill #0
  Filled 2021-01-25: qty 30, 30d supply, fill #1
  Filled 2021-03-01: qty 30, 30d supply, fill #2

## 2020-12-30 MED ORDER — GABAPENTIN 300 MG PO CAPS
300.0000 mg | ORAL_CAPSULE | Freq: Every evening | ORAL | 2 refills | Status: DC
  Filled 2020-12-30: qty 30, 30d supply, fill #0
  Filled 2021-01-25: qty 30, 30d supply, fill #1
  Filled 2021-03-01: qty 30, 30d supply, fill #2

## 2020-12-30 MED ORDER — ESZOPICLONE 3 MG PO TABS
3.0000 mg | ORAL_TABLET | Freq: Every evening | ORAL | 2 refills | Status: DC
  Filled 2020-12-30: qty 30, 30d supply, fill #0
  Filled 2021-01-23 – 2021-01-27 (×2): qty 30, 30d supply, fill #1
  Filled 2021-02-28: qty 30, 30d supply, fill #2

## 2020-12-30 MED ORDER — ROSUVASTATIN CALCIUM 5 MG PO TABS
ORAL_TABLET | ORAL | 0 refills | Status: DC
  Filled 2020-12-30: qty 39, 90d supply, fill #0

## 2020-12-30 MED ORDER — AMANTADINE HCL 100 MG PO CAPS
100.0000 mg | ORAL_CAPSULE | Freq: Every evening | ORAL | 2 refills | Status: DC
  Filled 2020-12-30: qty 30, 30d supply, fill #0
  Filled 2021-01-25: qty 30, 30d supply, fill #1
  Filled 2021-03-01: qty 30, 30d supply, fill #2

## 2020-12-30 NOTE — Telephone Encounter (Signed)
   *  STAT* If patient is at the pharmacy, call can be transferred to refill team.   1. Which medications need to be refilled? (please list name of each medication and dose if known)   rosuvastatin (CRESTOR) 5 MG tablet   2. Which pharmacy/location (including street and city if local pharmacy) is medication to be sent to? [facility]  3. Do they need a 30 day or 90 day supply? 90 days   Pt only have 1 pill left, need refill today

## 2021-01-04 ENCOUNTER — Other Ambulatory Visit (HOSPITAL_COMMUNITY): Payer: Self-pay

## 2021-01-12 ENCOUNTER — Other Ambulatory Visit (HOSPITAL_COMMUNITY): Payer: Self-pay

## 2021-01-13 ENCOUNTER — Other Ambulatory Visit (HOSPITAL_COMMUNITY): Payer: Self-pay

## 2021-01-13 ENCOUNTER — Telehealth: Payer: Self-pay | Admitting: Cardiovascular Disease

## 2021-01-13 MED ORDER — ROSUVASTATIN CALCIUM 5 MG PO TABS
ORAL_TABLET | ORAL | 0 refills | Status: DC
  Filled 2021-01-13: qty 39, 90d supply, fill #0

## 2021-01-13 NOTE — Telephone Encounter (Signed)
*  STAT* If patient is at the pharmacy, call can be transferred to refill team.   1. Which medications need to be refilled? (please list name of each medication and dose if known)  nee d a new prescription for her Crestor directions 1 a day  2. Which pharmacy/location (including street and city if local pharmacy) is medication to be sent to? [facility] [address] [place]  3. Do they need a 30 day or 90 day supply? 90 days and refills

## 2021-01-15 ENCOUNTER — Other Ambulatory Visit (HOSPITAL_COMMUNITY): Payer: Self-pay

## 2021-01-20 ENCOUNTER — Other Ambulatory Visit (HOSPITAL_BASED_OUTPATIENT_CLINIC_OR_DEPARTMENT_OTHER): Payer: Self-pay | Admitting: Obstetrics & Gynecology

## 2021-01-20 MED ORDER — VITAMIN D (ERGOCALCIFEROL) 1.25 MG (50000 UNIT) PO CAPS
50000.0000 [IU] | ORAL_CAPSULE | ORAL | 3 refills | Status: DC
  Filled 2021-01-20 – 2021-06-07 (×3): qty 12, 84d supply, fill #0
  Filled 2021-10-18: qty 12, 84d supply, fill #1
  Filled 2022-01-17: qty 12, 84d supply, fill #2

## 2021-01-21 ENCOUNTER — Other Ambulatory Visit (HOSPITAL_COMMUNITY): Payer: Self-pay

## 2021-01-24 ENCOUNTER — Other Ambulatory Visit (HOSPITAL_COMMUNITY): Payer: Self-pay

## 2021-01-25 ENCOUNTER — Other Ambulatory Visit (HOSPITAL_COMMUNITY): Payer: Self-pay

## 2021-01-27 ENCOUNTER — Other Ambulatory Visit (HOSPITAL_COMMUNITY): Payer: Self-pay

## 2021-02-02 ENCOUNTER — Other Ambulatory Visit (HOSPITAL_COMMUNITY): Payer: Self-pay

## 2021-02-04 DIAGNOSIS — H5203 Hypermetropia, bilateral: Secondary | ICD-10-CM | POA: Diagnosis not present

## 2021-02-12 ENCOUNTER — Encounter (HOSPITAL_BASED_OUTPATIENT_CLINIC_OR_DEPARTMENT_OTHER): Payer: Self-pay | Admitting: *Deleted

## 2021-02-12 ENCOUNTER — Other Ambulatory Visit: Payer: Self-pay

## 2021-02-12 ENCOUNTER — Emergency Department (HOSPITAL_BASED_OUTPATIENT_CLINIC_OR_DEPARTMENT_OTHER)
Admission: EM | Admit: 2021-02-12 | Discharge: 2021-02-12 | Disposition: A | Discharge status: Home / Self Care | Payer: 59 | Attending: Emergency Medicine | Admitting: Emergency Medicine

## 2021-02-12 ENCOUNTER — Emergency Department (HOSPITAL_BASED_OUTPATIENT_CLINIC_OR_DEPARTMENT_OTHER): Payer: 59

## 2021-02-12 DIAGNOSIS — S52612A Displaced fracture of left ulna styloid process, initial encounter for closed fracture: Secondary | ICD-10-CM | POA: Insufficient documentation

## 2021-02-12 DIAGNOSIS — Z79899 Other long term (current) drug therapy: Secondary | ICD-10-CM | POA: Diagnosis not present

## 2021-02-12 DIAGNOSIS — S6292XA Unspecified fracture of left wrist and hand, initial encounter for closed fracture: Secondary | ICD-10-CM | POA: Diagnosis not present

## 2021-02-12 DIAGNOSIS — Y9389 Activity, other specified: Secondary | ICD-10-CM | POA: Insufficient documentation

## 2021-02-12 DIAGNOSIS — S6992XA Unspecified injury of left wrist, hand and finger(s), initial encounter: Secondary | ICD-10-CM | POA: Diagnosis present

## 2021-02-12 DIAGNOSIS — W19XXXA Unspecified fall, initial encounter: Secondary | ICD-10-CM

## 2021-02-12 DIAGNOSIS — S52612D Displaced fracture of left ulna styloid process, subsequent encounter for closed fracture with routine healing: Secondary | ICD-10-CM | POA: Diagnosis not present

## 2021-02-12 DIAGNOSIS — Y92828 Other wilderness area as the place of occurrence of the external cause: Secondary | ICD-10-CM | POA: Diagnosis not present

## 2021-02-12 DIAGNOSIS — S62102A Fracture of unspecified carpal bone, left wrist, initial encounter for closed fracture: Secondary | ICD-10-CM

## 2021-02-12 DIAGNOSIS — W1781XA Fall down embankment (hill), initial encounter: Secondary | ICD-10-CM | POA: Insufficient documentation

## 2021-02-12 DIAGNOSIS — S52502A Unspecified fracture of the lower end of left radius, initial encounter for closed fracture: Secondary | ICD-10-CM | POA: Insufficient documentation

## 2021-02-12 DIAGNOSIS — Z87891 Personal history of nicotine dependence: Secondary | ICD-10-CM | POA: Insufficient documentation

## 2021-02-12 DIAGNOSIS — M7989 Other specified soft tissue disorders: Secondary | ICD-10-CM | POA: Diagnosis not present

## 2021-02-12 DIAGNOSIS — T1490XA Injury, unspecified, initial encounter: Secondary | ICD-10-CM

## 2021-02-12 MED ORDER — SODIUM CHLORIDE 0.9 % IV SOLN
INTRAVENOUS | Status: AC | PRN
  Administered 2021-02-12: 1000 mL via INTRAVENOUS

## 2021-02-12 MED ORDER — OXYCODONE-ACETAMINOPHEN 5-325 MG PO TABS
1.0000 | ORAL_TABLET | Freq: Once | ORAL | Status: AC
  Administered 2021-02-12: 1 via ORAL
  Filled 2021-02-12: qty 1

## 2021-02-12 MED ORDER — PROPOFOL 10 MG/ML IV BOLUS
0.5000 mg/kg | Freq: Once | INTRAVENOUS | Status: AC
  Administered 2021-02-12: 50 mg via INTRAVENOUS
  Filled 2021-02-12: qty 20

## 2021-02-12 MED ORDER — OXYCODONE HCL 5 MG PO TABS: 5.0000 mg | ORAL_TABLET | Freq: Four times a day (QID) | ORAL | 0 refills | Status: DC | PRN

## 2021-02-12 MED ORDER — SODIUM CHLORIDE 0.9 % IV BOLUS
1000.0000 mL | Freq: Once | INTRAVENOUS | Status: AC
  Administered 2021-02-12: 1000 mL via INTRAVENOUS

## 2021-02-12 MED ORDER — ONDANSETRON HCL 4 MG/2ML IJ SOLN
4.0000 mg | Freq: Once | INTRAMUSCULAR | Status: AC
  Administered 2021-02-12: 4 mg via INTRAVENOUS
  Filled 2021-02-12: qty 2

## 2021-02-12 MED ORDER — LIDOCAINE HCL (PF) 1 % IJ SOLN
10.0000 mL | Freq: Once | INTRAMUSCULAR | Status: DC
  Filled 2021-02-12: qty 10

## 2021-02-12 MED ORDER — PROPOFOL 10 MG/ML IV BOLUS
INTRAVENOUS | Status: AC | PRN
  Administered 2021-02-12 (×2): 20 mg via INTRAVENOUS
  Administered 2021-02-12: 50 mg via INTRAVENOUS
  Administered 2021-02-12: 20 mg via INTRAVENOUS
  Administered 2021-02-12 (×2): 25 mg via INTRAVENOUS

## 2021-02-12 NOTE — ED Provider Notes (Signed)
[facility] EMERGENCY DEPT Provider Note   CSN: [id number] Arrival date & time: 02/12/21  1818     History Chief Complaint  Patient presents with  . Fall  . Wrist Pain    [name] is a 70 y.o. female.  Patient fell prior to arrival.  Landed on her left wrist with obvious deformity.  Did not hit her head or lose consciousness.  Not on blood thinners.  Not having pain elsewhere.  The history is provided by the patient.  Fall This is a new problem. The current episode started less than 1 hour ago. The problem has been resolved. Pertinent negatives include no chest pain, no abdominal pain and no shortness of breath. Nothing aggravates the symptoms. Nothing relieves the symptoms. She has tried nothing for the symptoms. The treatment provided no relief.      Past Medical History:  Diagnosis Date  . Anxiety   . Complication of anesthesia    past hx. laryngospasm following 2 past surgeries, not recent  . Depression   . Exertional dyspnea 08/04/2019  . GERD (gastroesophageal reflux disease)   . Hiatal hernia 10/17/2019  . Hyperlipidemia   . Insomnia    periodically uses med.  . Perforated sigmoid colon ([place]) 04/28/14   during colonoscopy, "rupture colon"became septic, has colostomy and open surgical wound  . Prediabetes 08/09/2017  . Suicide attempt ([place])   . Vitamin D deficiency     Patient Active Problem List   Diagnosis Date Noted  . Vitamin D deficiency 12/02/2020  . Postmenopausal 12/02/2020  . H/O abdominal hysterectomy 12/02/2020  . 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 Chronic insomnia 12/02/2020  . Hiatal hernia 10/17/2019  . Exertional dyspnea 08/04/2019  . Iron deficiency anemia 08/08/2017  . S/P colectomy 04/28/2014  . Hyperlipidemia 07/21/2010  . Depression, controlled 07/21/2010  . GERD 07/21/2010  . PNEUMONIA, HX OF 07/21/2010    Past Surgical History:  Procedure Laterality Date  . ABDOMINAL HYSTERECTOMY  1999   TAH/BSO  . APPLICATION  OF WOUND VAC  04/28/2014   Procedure: APPLICATION OF WOUND VAC;  Surgeon: Rolm Bookbinder, MD;  Location: [facility];  Service: General;;  . BLEPHAROPLASTY  2010  . BREAST SURGERY  2001   Breast reduction  . COLOSTOMY N/A 04/28/2014   Procedure: COLOSTOMY;  Surgeon: Rolm Bookbinder, MD;  Location: [facility];  Service: General;  Laterality: N/A;  . COLOSTOMY REVISION N/A 04/28/2014   Procedure: COLON RESECTION SIGMOID;  Surgeon: Rolm Bookbinder, MD;  Location: [facility];  Service: General;  Laterality: N/A;  . COLOSTOMY TAKEDOWN N/A 09/02/2014   Procedure: LAPAROSCOPIC COLOSTOMY REVERSAL;  Surgeon: [name], MD;  Location: [facility];  Service: General;  Laterality: N/A;  . COSMETIC SURGERY    . DEBRIDEMENT TENNIS ELBOW    . DILATION AND CURETTAGE OF UTERUS     x3, hysteroscopy  . ELBOW SURGERY  2007  . EYE SURGERY     Blepharoplasty  . LAPAROTOMY N/A 04/28/2014   Procedure: EXPLORATORY LAPAROTOMY,SIGMOID COLECTOMY;  Surgeon: Rolm Bookbinder, MD;  Location: [facility];  Service: General;  Laterality: N/A;  . REDUCTION MAMMAPLASTY Bilateral   . scar and adhesion repair  06/01/15   and liposuction  . ULNAR TUNNEL RELEASE Left 2010     OB History     Gravida  7   Para  2   Term      Preterm      AB      Living  2  SAB      IAB      Ectopic      Multiple      Live Births              Family History  Problem Relation Age of Onset  . Hyperlipidemia Father   . Hypertension Father   . Diabetes Father   . Cancer Father        History of bladder cancer  . Heart disease Father   . Other Father        4 ft intestinal removed-had gangrene in gallbladder, then spread to intestines  . Heart attack Father   . Hypertension Mother   . Diabetes Mother   . Heart disease Maternal Grandmother   . Stroke Maternal Grandfather   . Hyperlipidemia Brother   . Cancer Paternal Grandmother        uterine  . Cancer Paternal Grandfather        pancreatic cancer  . Esophageal cancer  Sister     Social History   Tobacco Use  . Smoking status: Former    Packs/day: 0.50    Pack years: 0.00    Types: Cigarettes    Quit date: 09/01/2003    Years since quitting: 17.4  . Smokeless tobacco: Never  . Tobacco comments:    occasional  Vaping Use  . Vaping Use: Every day  . Substances: Nicotine, Nicotine-salt  Substance Use Topics  . Alcohol use: Yes    Alcohol/week: 10.0 standard drinks    Types: 10 Cans of beer per week  . Drug use: No    Home Medications Prior to Admission medications   Medication Sig Start Date End Date Taking? Authorizing Provider  amantadine (SYMMETREL) 100 MG capsule Take 1 capsule (100 mg total) by mouth at bedtime. 12/30/20  Yes   esomeprazole (NEXIUM) 40 MG capsule TAKE 1 CAPSULE BY MOUTH ONCE DAILY BEFORE BREAKFAST 08/27/18  Yes Henson, Vickie L, NP-C  Eszopiclone 3 MG TABS Take 1 tablet (3 mg total) by mouth at bedtime. 12/30/20  Yes   famotidine (PEPCID) 20 MG tablet Take 1 tablet (20 mg total) by mouth at bedtime. 11/26/20  Yes Milus Banister, MD  gabapentin (NEURONTIN) 300 MG capsule Take 1 capsule (300 mg total) by mouth at bedtime. 12/30/20  Yes   ibuprofen (ADVIL,MOTRIN) 200 MG tablet Take 800 mg by mouth every 6 (six) hours as needed for mild pain or moderate pain.   Yes [provider]  lamoTRIgine (LAMICTAL) 100 MG tablet Take 1 tablet (100 mg total) by mouth daily. 12/30/20  Yes   lamoTRIgine (LAMICTAL) 150 MG tablet TAKE 1 TABLET BY MOUTH ONCE A DAY * IF OUT OF MEDICATION MORE THAN 1 WEEK DO NO RESTART. CALL [phone] 12/08/20  Yes   oxyCODONE (ROXICODONE) 5 MG immediate release tablet Take 1 tablet (5 mg total) by mouth every 6 (six) hours as needed for up to 15 doses for severe pain. 02/12/21  Yes Zaray Gatchel, DO  prazosin (MINIPRESS) 2 MG capsule Take 1 capsule (2 mg total) by mouth at bedtime. 12/30/20  Yes   rosuvastatin (CRESTOR) 5 MG tablet TAKE 1 TABLET by mouth 3 DAYS A WEEK OR AS DIRECTED 01/13/21  Yes Skeet Latch, MD  Vitamin D, Ergocalciferol, (DRISDOL) 1.25 MG (50000 UNIT) CAPS capsule Take 1 capsule (50,000 Units total) by mouth every 7 (seven) days. 01/20/21  Yes Megan Salon, MD  amantadine (SYMMETREL) 100 MG capsule TAKE 1 CAPSULE BY MOUTH  EVERY MORNING 09/14/20 09/14/21    gabapentin (NEURONTIN) 100 MG capsule TAKE 1 TO 3 CAPSULES BY MOUTH AT BEDTIME AS NEEDED FOR INSOMNIA 09/14/20 09/14/21      Allergies    Bee venom and Statins  Review of Systems   Review of Systems  Constitutional:  Negative for chills and fever.  Respiratory:  Negative for cough and shortness of breath.   Cardiovascular:  Negative for chest pain.  Gastrointestinal:  Negative for abdominal pain and vomiting.  Musculoskeletal:  Positive for joint swelling. Negative for arthralgias and back pain.  Skin:  Positive for color change. Negative for rash.  Neurological:  Negative for seizures, syncope, weakness and numbness.  All other systems reviewed and are negative.  Physical Exam Updated Vital Signs BP (!) 129/116   Pulse 95   Temp 98.4 F (36.9 C) (Oral)   Resp 16   Ht 5' 6.5" (1.689 m)   Wt 76.2 kg   LMP 08/07/1998   SpO2 100%   BMI 26.71 kg/m   Physical Exam Vitals and nursing note reviewed.  Constitutional:      General: She is not in acute distress.    Appearance: She is well-developed. She is not ill-appearing.  HENT:     Head: Normocephalic and atraumatic.     Mouth/Throat:     Mouth: Mucous membranes are moist.  Eyes:     Extraocular Movements: Extraocular movements intact.     Conjunctiva/sclera: Conjunctivae normal.     Pupils: Pupils are equal, round, and reactive to light.  Cardiovascular:     Rate and Rhythm: Normal rate and regular rhythm.     Pulses: Normal pulses.     Heart sounds: Normal heart sounds. No murmur heard. Pulmonary:     Effort: Pulmonary effort is normal. No respiratory distress.     Breath sounds: Normal breath sounds.  Abdominal:     Palpations: Abdomen is soft.      Tenderness: There is no abdominal tenderness.  Musculoskeletal:        General: Swelling, tenderness and deformity present.     Cervical back: Neck supple.     Comments: Deformity to left wrist with some ecchymosis, decreased range of motion secondary to pain  Skin:    General: Skin is warm and dry.     Capillary Refill: Capillary refill takes less than 2 seconds.  Neurological:     General: No focal deficit present.     Mental Status: She is alert.     Sensory: No sensory deficit.     Motor: No weakness.    ED Results / Procedures / Treatments   Labs (all labs ordered are listed, but only abnormal results are displayed) Labs Reviewed - No data to display  EKG None  Radiology DG Wrist 2 Views Left  Result Date: 02/12/2021 CLINICAL DATA:  Status post reduction of distal radius and ulnar styloid fractures. EXAM: LEFT WRIST - 2 VIEW COMPARISON:  Earlier today. FINDINGS: Near and tonic position and alignment of the fragments of the previously demonstrated comminuted distal radial metaphysis and epiphysis fracture with intra-articular extension. Return of the distal radioulnar joint to a normal alignment. Less radial displacement of the distal fragment of the ulnar styloid fracture. A fiberglass splint is in place. IMPRESSION: Significantly improved position and alignment of the previously demonstrated distal radius and ulnar styloid fractures. Electronically Signed   By: Claudie Revering M.D.   On: 02/12/2021 21:12   DG Wrist Complete Left  Result Date: 02/12/2021  CLINICAL DATA:  Fall EXAM: LEFT WRIST - COMPLETE 3+ VIEW COMPARISON:  None. FINDINGS: Complex, multi fragmentary intra-articular and transmetaphyseal fracture of the distal radius with impaction, dorsal angulation and dorsal displacement abdominal and fragments by nearly 3/4 shaft width. Additional minimally displaced and rotated fracture fragment of the ulnar styloid process. Conspicuous widening of the distal radioulnar joint.  IMPRESSION: Multi fragmentary intra-articular and trans metaphyseal fracture of the distal radius with dorsal angulation, displacement and impaction. Minimally displaced ulnar styloid process fracture. Diastatic appearance of distal radioulnar joint. Consider further evaluation of the elbow/proximal forearm with radiographs as warranted. Diffuse soft tissue swelling of the wrist. Electronically Signed   By: Lovena Le M.D.   On: 02/12/2021 20:06    Procedures .Sedation  Date/Time: 02/12/2021 8:46 PM Performed by: Lennice Sites, DO Authorized by: Lennice Sites, DO   Consent:    Consent obtained:  Written   Consent given by:  Patient   Risks discussed:  Allergic reaction, dysrhythmia, inadequate sedation, nausea, prolonged hypoxia resulting in organ damage, prolonged sedation necessitating reversal, respiratory compromise necessitating ventilatory assistance and intubation and vomiting   Alternatives discussed:  Analgesia without sedation Universal protocol:    Procedure explained and questions answered to patient or proxy's satisfaction: yes     Relevant documents present and verified: yes     Immediately prior to procedure, a time out was called: yes     Patient identity confirmed:  Anonymous protocol, patient vented/unresponsive Indications:    Procedure performed:  Fracture reduction   Procedure necessitating sedation performed by:  Physician performing sedation Pre-sedation assessment:    Time since last food or drink:  3 hours   ASA classification: class 2 - patient with mild systemic disease     Mouth opening:  3 or more finger widths   Thyromental distance:  4 finger widths   Mallampati score:  II - soft palate, uvula, fauces visible   Neck mobility: normal     Pre-sedation assessments completed and reviewed: airway patency, cardiovascular function, hydration status, mental status, nausea/vomiting, pain level, respiratory function and temperature     Pre-sedation assessment  completed:  02/12/2021 8:47 PM Immediate pre-procedure details:    Reassessment: Patient reassessed immediately prior to procedure     Reviewed: vital signs     Verified: bag valve mask available, emergency equipment available and reversal medications available   Procedure details (see MAR for exact dosages):    Preoxygenation:  Nasal cannula   Sedation:  Propofol   Intended level of sedation: deep and moderate (conscious sedation)   Intra-procedure monitoring:  Blood pressure monitoring, cardiac monitor, continuous capnometry, continuous pulse oximetry, frequent LOC assessments and frequent vital sign checks   Intra-procedure events: none     Total Provider sedation time (minutes):  25 Post-procedure details:    Post-sedation assessment completed:  02/12/2021 9:44 PM   Attendance: Constant attendance by certified staff until patient recovered     Recovery: Patient returned to pre-procedure baseline     Post-sedation assessments completed and reviewed: airway patency, cardiovascular function, hydration status, mental status, nausea/vomiting, pain level, respiratory function and temperature     Patient is stable for discharge or admission: yes     Procedure completion:  Tolerated well, no immediate complications .Ortho Injury Treatment  Date/Time: 02/12/2021 8:47 PM Performed by: Lennice Sites, DO Authorized by: Lennice Sites, DO   Consent:    Consent obtained:  Written   Consent given by:  Patient   Risks discussed:  Fracture, irreducible dislocation,  nerve damage, recurrent dislocation, stiffness, restricted joint movement and vascular damage   Alternatives discussed:  No treatmentInjury location: wrist Location details: left wrist Injury type: fracture Fracture type: distal radius and ulnar styloid Pre-procedure neurovascular assessment: neurovascularly intact Pre-procedure distal perfusion: normal Pre-procedure neurological function: normal Pre-procedure range of motion:  reduced  Patient sedated: Yes. Refer to sedation procedure documentation for details of sedation. Manipulation performed: yes Skin traction used: yes Skeletal traction used: yes Reduction successful: yes X-ray confirmed reduction: yes Immobilization: splint Splint type: sugar tong Splint Applied by: ED Provider Supplies used: Ortho-Glass Post-procedure neurovascular assessment: post-procedure neurovascularly intact Post-procedure distal perfusion: normal Post-procedure neurological function: normal Post-procedure range of motion comment: splint     Medications Ordered in ED Medications  lidocaine (PF) (XYLOCAINE) 1 % injection 10 mL (has no administration in time range)  propofol (DIPRIVAN) 10 mg/mL bolus/IV push 38.1 mg (50 mg Intravenous Given 02/12/21 2029)  sodium chloride 0.9 % bolus 1,000 mL (0 mLs Intravenous Stopped 02/12/21 2123)  ondansetron (ZOFRAN) injection 4 mg (4 mg Intravenous Given 02/12/21 2017)  propofol (DIPRIVAN) 10 mg/mL bolus/IV push (20 mg Intravenous Given 02/12/21 2040)  0.9 %  sodium chloride infusion (0 mLs Intravenous Stopped 02/12/21 2123)    ED Course  I have reviewed the triage vital signs and the nursing notes.  Pertinent labs & imaging results that were available during my care of the patient were reviewed by me and considered in my medical decision making (see chart for details).    MDM Rules/Calculators/A&P                          [name] is a 70 year old female who presents the ED with left wrist pain after mechanical fall.  Did not hit her head or lose consciousness.  No neck pain.  Normal vitals.  No fever.  Not on blood thinners.  Overall has deformity to the left wrist but neurovascular neuromuscularly intact.  X-ray showed multi fragmentary interarticular transmetaphyseal fracture of the distal radius with angulation displacement and impaction.  Also fracture of the ulnar styloid process as well.  Patient was consented for conscious  sedation using propofol and successful reduction of the left wrist fracture was performed.  This was confirmed by x-ray.  She was neurovascular neuromuscular intact before and after reduction.  Talked with Dr. Grandville Silos with hand who will follow up with her outpatient.  Prescribed oxycodone for breakthrough pain.  Discharged in good condition.  This chart was dictated using voice recognition software.  Despite best efforts to proofread,  errors can occur which can change the documentation meaning.   Final Clinical Impression(s) / ED Diagnoses Final diagnoses:  Fall  Closed fracture of left wrist, initial encounter    Rx / DC Orders ED Discharge Orders          Ordered    oxyCODONE (ROXICODONE) 5 MG immediate release tablet  Every 6 hours PRN        02/12/21 2146             Lennice Sites, DO 02/12/21 2148

## 2021-02-12 NOTE — ED Notes (Signed)
Assisted pt to car. Pt's friend is taking her home.

## 2021-02-12 NOTE — ED Triage Notes (Signed)
Pt was trying to save a dog, fell down a hill. Deformity to left wrist.

## 2021-02-12 NOTE — Discharge Instructions (Addendum)
Follow-up with Dr. Grandville Silos with hand surgery.  Office will call you on Monday for an appointment.  Recommend 1000 mg of Tylenol every 6 hours as needed for pain.  Use Roxicodone for breakthrough pain.  Please keep your splint dry and do not get it wet.

## 2021-02-12 NOTE — ED Notes (Signed)
Pt given a cracker/water to take with medication.

## 2021-02-12 NOTE — ED Notes (Signed)
Pt unable to get pain medications filled tonight. Will speak with MD about getting patient something for pain at d/c home.

## 2021-02-14 ENCOUNTER — Other Ambulatory Visit: Payer: Self-pay

## 2021-02-14 ENCOUNTER — Other Ambulatory Visit: Payer: Self-pay | Admitting: Orthopedic Surgery

## 2021-02-14 ENCOUNTER — Encounter (HOSPITAL_BASED_OUTPATIENT_CLINIC_OR_DEPARTMENT_OTHER): Payer: Self-pay | Admitting: Orthopedic Surgery

## 2021-02-15 DIAGNOSIS — S6292XA Unspecified fracture of left wrist and hand, initial encounter for closed fracture: Secondary | ICD-10-CM | POA: Diagnosis not present

## 2021-02-16 ENCOUNTER — Encounter (HOSPITAL_COMMUNITY): Payer: Self-pay | Admitting: Orthopedic Surgery

## 2021-02-16 ENCOUNTER — Other Ambulatory Visit: Payer: Self-pay

## 2021-02-16 ENCOUNTER — Other Ambulatory Visit (HOSPITAL_COMMUNITY): Payer: Self-pay

## 2021-02-16 DIAGNOSIS — F332 Major depressive disorder, recurrent severe without psychotic features: Secondary | ICD-10-CM | POA: Diagnosis not present

## 2021-02-16 MED ORDER — LAMOTRIGINE 150 MG PO TABS
ORAL_TABLET | ORAL | 2 refills | Status: DC
  Filled 2021-02-16 – 2021-04-26 (×3): qty 30, 30d supply, fill #0
  Filled 2021-06-07: qty 30, 30d supply, fill #1

## 2021-02-16 NOTE — Progress Notes (Signed)
PCP - [name]  Cardiologist - Skeet Latch, Md EKG -  Chest x-ray -  ECHO -  Cardiac Cath -  CPAP -    COVID TEST- n/a  Anesthesia review: n/a  -------------  SDW INSTRUCTIONS:  Your procedure is scheduled on Saturday 7/16. Please report to [facility] ED at 0600 A.M., and check in . Call this number if you have problems the morning of surgery: [phone]   Remember: Do not eat or drink after midnight the night before your surgery   Medications to take morning of surgery with a sip of water include: amantadine (SYMMETREL)  esomeprazole (NEXIUM) lamoTRIgine (LAMICTAL)  rosuvastatin (CRESTOR)  As of today, STOP taking any Aspirin (unless otherwise instructed by your surgeon), Aleve, Naproxen, Ibuprofen, Motrin, Advil, Goody's, BC's, all herbal medications, fish oil, and all vitamins.    The Morning of Surgery Do not wear jewelry, make-up or nail polish. Do not wear lotions, powders, or perfumes, or deodorant Do not shave 48 hours prior to surgery.    Do not bring valuables to the hospital. [facility] is not responsible for any belongings or valuables.  If you are a smoker, DO NOT Smoke 24 hours prior to surgery If you wear a CPAP at night please bring your mask the morning of surgery  Remember that you must have someone to transport you home after your surgery, and remain with you for 24 hours if you are discharged the same day.  Please bring cases for contacts, glasses, hearing aids, dentures or bridgework because it cannot be worn into surgery.   Patients discharged the day of surgery will not be allowed to drive home.   Please shower the NIGHT BEFORE/MORNING OF SURGERY (use antibacterial soap like DIAL soap if possible). Wear comfortable clothes the morning of surgery. Oral Hygiene is also important to reduce your risk of infection.  Remember - BRUSH YOUR TEETH THE MORNING OF SURGERY WITH YOUR REGULAR TOOTHPASTE  Patient denies shortness of breath, fever,  cough and chest pain.

## 2021-02-17 NOTE — H&P (Addendum)
[name] is an 70 y.o. female.   Chief Complaint: LEFT WRIST INJURY  HPI: The patient is a 70 year old left-hand dominant female who fell on 02/12/21 when trying to rescue a dog causing her to fall down hill.  She was seen in the ED and a closed reduction was attempted.  She was splinted and sent to our office for further evaluation. She continues to have a displaced fracture of the distal radius when seen in our office.  She also complains of weakness, pain, swelling, and stiffness.  Discussed the reason and rationale for surgical intervention. She is here today for surgery. She denies chest pain, shortness of breath, fever, chills, vomiting, nausea, diarrhea.  Past Medical History:  Diagnosis Date   Anxiety    Complication of anesthesia    past hx. laryngospasm following 2 past surgeries, not recent   Depression    Exertional dyspnea 08/04/2019   GERD (gastroesophageal reflux disease)    Hiatal hernia 10/17/2019   Hyperlipidemia    Insomnia    periodically uses med.   Perforated sigmoid colon ([place]) 04/28/2014   during colonoscopy, "rupture colon"became septic, has colostomy and open surgical wound   Prediabetes 08/09/2017   Suicide attempt ([place])    Vitamin D deficiency     Past Surgical History:  Procedure Laterality Date   ABDOMINAL HYSTERECTOMY  6269   TAH/BSO   APPLICATION OF WOUND VAC  04/28/2014   Procedure: APPLICATION OF WOUND VAC;  Surgeon: Rolm Bookbinder, MD;  Location: [facility];  Service: General;;   BLEPHAROPLASTY  2010   BREAST SURGERY  2001   Breast reduction   COLOSTOMY N/A 04/28/2014   Procedure: COLOSTOMY;  Surgeon: Rolm Bookbinder, MD;  Location: [facility];  Service: General;  Laterality: N/A;   COLOSTOMY REVISION N/A 04/28/2014   Procedure: COLON RESECTION SIGMOID;  Surgeon: Rolm Bookbinder, MD;  Location: [facility];  Service: General;  Laterality: N/A;   COLOSTOMY TAKEDOWN N/A 09/02/2014   Procedure: LAPAROSCOPIC COLOSTOMY REVERSAL;  Surgeon: [name], MD;  Location: [facility];  Service: General;  Laterality: N/A;   COSMETIC SURGERY     DEBRIDEMENT TENNIS ELBOW     DILATION AND CURETTAGE OF UTERUS     x3, hysteroscopy   ELBOW SURGERY  2007   EYE SURGERY     Blepharoplasty   LAPAROTOMY N/A 04/28/2014   Procedure: EXPLORATORY LAPAROTOMY,SIGMOID COLECTOMY;  Surgeon: Rolm Bookbinder, MD;  Location: [facility];  Service: General;  Laterality: N/A;   REDUCTION MAMMAPLASTY Bilateral    scar and adhesion repair  06/01/15   and liposuction   ULNAR TUNNEL RELEASE Left 2010    Family History  Problem Relation Age of Onset   Hyperlipidemia Father    Hypertension Father    Diabetes Father    Cancer Father        History of bladder cancer   Heart disease Father    Other Father        4 ft intestinal removed-had gangrene in gallbladder, then spread to intestines   Heart attack Father    Hypertension Mother    Diabetes Mother    Heart disease Maternal Grandmother    Stroke Maternal Grandfather    Hyperlipidemia Brother    Cancer Paternal Grandmother        uterine   Cancer Paternal Grandfather        pancreatic cancer   Esophageal cancer Sister    Social History:  reports that she quit smoking about 17 years ago.  Her smoking use included cigarettes. She smoked an average of .5 packs per day. She has never used smokeless tobacco. She reports current alcohol use of about 14.0 standard drinks of alcohol per week. She reports that she does not use drugs.  Allergies:  Allergies  Allergen Reactions   Bee Venom Swelling    Yellow jackets   Statins Other (See Comments)    Muscle pains    No medications prior to admission.    No results found for this or any previous visit (from the past 48 hour(s)). No results found.  ROS NO RECENT ILLNESSES OR HOSPITALIZATIONS  Height 5\' 6"  (1.676 m), weight 77.1 kg, last menstrual period 08/07/1998. Physical Exam  General Appearance:  Alert, cooperative, no distress, appears stated age  Head:   Normocephalic, without obvious abnormality, atraumatic  Eyes:  Pupils equal, conjunctiva/corneas clear,         Throat: Lips, mucosa, and tongue normal; teeth and gums normal  Neck: No visible masses     Lungs:   respirations unlabored  Chest Wall:  No tenderness or deformity  Heart:  Regular rate and rhythm,  Abdomen:   Soft, non-tender,         Extremities: LUE: SKIN INTACT,FINGERS WARM WELL PERFUSED ABLE TO EXTEND THUMB AND FINGERSS LIMITED WRIST AND FOREARM MOBILITY  Pulses: 2+ and symmetric  Skin: Skin color, texture, turgor normal, no rashes or lesions     Neurologic: Normal     Assessment/Plan LEFT DISTAL RADIUS INTRA-ARTICULAR FRACTURE    - LEFT DISTAL RADIUS OPEN REDUCTION INTERNAL FIXATION WITH REPAIR AS INDICATED   WE ARE PLANNING SURGERY FOR YOUR UPPER EXTREMITY. THE RISKS AND BENEFITS OF SURGERY INCLUDE BUT NOT LIMITED TO BLEEDING INFECTION, DAMAGE TO NEARBY NERVES ARTERIES TENDONS, FAILURE OF SURGERY TO ACCOMPLISH ITS INTENDED GOALS, PERSISTENT SYMPTOMS AND NEED FOR FURTHER SURGICAL INTERVENTION. WITH THIS IN MIND WE WILL PROCEED. I HAVE DISCUSSED WITH THE PATIENT THE PRE AND POSTOPERATIVE REGIMEN AND THE DOS AND DON'TS. PT VOICED UNDERSTANDING AND INFORMED CONSENT SIGNED.   R/B/A DISCUSSED WITH PT IN OFFICE.  PT VOICED UNDERSTANDING OF PLAN CONSENT SIGNED DAY OF SURGERY PT SEEN AND EXAMINED PRIOR TO OPERATIVE PROCEDURE/DAY OF SURGERY SITE MARKED. QUESTIONS ANSWERED [place] MD    Brynda Peon 02/17/2021, 12:20 PM   R/B/A DISCUSSED WITH PT IN OFFICE.  PT VOICED UNDERSTANDING OF PLAN CONSENT SIGNED DAY OF SURGERY PT SEEN AND EXAMINED PRIOR TO OPERATIVE PROCEDURE/DAY OF SURGERY SITE MARKED. QUESTIONS ANSWERED WILL GO HOME FOLLOWING SURGERY   Iran Planas Md 02/19/21

## 2021-02-19 ENCOUNTER — Ambulatory Visit (HOSPITAL_COMMUNITY): Payer: 59

## 2021-02-19 ENCOUNTER — Encounter (HOSPITAL_COMMUNITY): Payer: Self-pay | Admitting: Orthopedic Surgery

## 2021-02-19 ENCOUNTER — Encounter (HOSPITAL_COMMUNITY)
Admission: RE | Disposition: A | Discharge status: Home / Self Care | Payer: Self-pay | Source: Home / Self Care | Attending: Orthopedic Surgery

## 2021-02-19 ENCOUNTER — Other Ambulatory Visit: Payer: Self-pay

## 2021-02-19 ENCOUNTER — Ambulatory Visit (HOSPITAL_COMMUNITY): Payer: 59 | Admitting: Anesthesiology

## 2021-02-19 ENCOUNTER — Ambulatory Visit (HOSPITAL_COMMUNITY)
Admission: RE | Admit: 2021-02-19 | Discharge: 2021-02-19 | Disposition: A | Discharge status: Home / Self Care | Payer: 59 | Attending: Orthopedic Surgery | Admitting: Orthopedic Surgery

## 2021-02-19 DIAGNOSIS — Z888 Allergy status to other drugs, medicaments and biological substances status: Secondary | ICD-10-CM | POA: Insufficient documentation

## 2021-02-19 DIAGNOSIS — Z91030 Bee allergy status: Secondary | ICD-10-CM | POA: Diagnosis not present

## 2021-02-19 DIAGNOSIS — S52572A Other intraarticular fracture of lower end of left radius, initial encounter for closed fracture: Secondary | ICD-10-CM | POA: Diagnosis not present

## 2021-02-19 DIAGNOSIS — S52592A Other fractures of lower end of left radius, initial encounter for closed fracture: Secondary | ICD-10-CM | POA: Diagnosis not present

## 2021-02-19 DIAGNOSIS — K219 Gastro-esophageal reflux disease without esophagitis: Secondary | ICD-10-CM | POA: Diagnosis not present

## 2021-02-19 DIAGNOSIS — E785 Hyperlipidemia, unspecified: Secondary | ICD-10-CM | POA: Diagnosis not present

## 2021-02-19 DIAGNOSIS — S52502A Unspecified fracture of the lower end of left radius, initial encounter for closed fracture: Secondary | ICD-10-CM

## 2021-02-19 DIAGNOSIS — Z87891 Personal history of nicotine dependence: Secondary | ICD-10-CM | POA: Insufficient documentation

## 2021-02-19 DIAGNOSIS — W19XXXA Unspecified fall, initial encounter: Secondary | ICD-10-CM | POA: Insufficient documentation

## 2021-02-19 DIAGNOSIS — G8918 Other acute postprocedural pain: Secondary | ICD-10-CM | POA: Diagnosis not present

## 2021-02-19 DIAGNOSIS — D509 Iron deficiency anemia, unspecified: Secondary | ICD-10-CM | POA: Diagnosis not present

## 2021-02-19 HISTORY — PX: ORIF WRIST FRACTURE: SHX2133

## 2021-02-19 SURGERY — OPEN REDUCTION INTERNAL FIXATION (ORIF) WRIST FRACTURE
Anesthesia: Regional | Site: Wrist | Laterality: Left

## 2021-02-19 MED ORDER — CHLORHEXIDINE GLUCONATE 0.12 % MT SOLN
15.0000 mL | Freq: Once | OROMUCOSAL | Status: AC
  Administered 2021-02-19: 15 mL via OROMUCOSAL

## 2021-02-19 MED ORDER — DEXAMETHASONE SODIUM PHOSPHATE 10 MG/ML IJ SOLN
INTRAMUSCULAR | Status: AC
  Filled 2021-02-19: qty 1

## 2021-02-19 MED ORDER — PROPOFOL 10 MG/ML IV BOLUS
INTRAVENOUS | Status: DC | PRN
  Administered 2021-02-19: 20 mg via INTRAVENOUS
  Administered 2021-02-19: 140 mg via INTRAVENOUS

## 2021-02-19 MED ORDER — FENTANYL CITRATE (PF) 100 MCG/2ML IJ SOLN
INTRAMUSCULAR | Status: DC | PRN
  Administered 2021-02-19: 100 ug via INTRAVENOUS

## 2021-02-19 MED ORDER — LIDOCAINE 2% (20 MG/ML) 5 ML SYRINGE
INTRAMUSCULAR | Status: DC | PRN
  Administered 2021-02-19: 25 mg via INTRAVENOUS
  Administered 2021-02-19: 20 mg via INTRAVENOUS

## 2021-02-19 MED ORDER — MIDAZOLAM HCL 2 MG/2ML IJ SOLN
INTRAMUSCULAR | Status: AC
  Filled 2021-02-19: qty 2

## 2021-02-19 MED ORDER — BUPIVACAINE-EPINEPHRINE (PF) 0.5% -1:200000 IJ SOLN
INTRAMUSCULAR | Status: DC | PRN
  Administered 2021-02-19: 30 mL via PERINEURAL

## 2021-02-19 MED ORDER — FENTANYL CITRATE (PF) 250 MCG/5ML IJ SOLN
INTRAMUSCULAR | Status: AC
  Filled 2021-02-19: qty 5

## 2021-02-19 MED ORDER — FENTANYL CITRATE (PF) 100 MCG/2ML IJ SOLN
INTRAMUSCULAR | Status: AC
  Filled 2021-02-19: qty 2

## 2021-02-19 MED ORDER — OXYCODONE HCL 5 MG PO TABS: 5.0000 mg | ORAL_TABLET | Freq: Once | ORAL | Status: DC | PRN

## 2021-02-19 MED ORDER — ACETAMINOPHEN 10 MG/ML IV SOLN: 1000.0000 mg | Freq: Once | INTRAVENOUS | Status: DC | PRN

## 2021-02-19 MED ORDER — AMISULPRIDE (ANTIEMETIC) 5 MG/2ML IV SOLN: 10.0000 mg | Freq: Once | INTRAVENOUS | Status: DC | PRN

## 2021-02-19 MED ORDER — LIDOCAINE 2% (20 MG/ML) 5 ML SYRINGE
INTRAMUSCULAR | Status: AC
  Filled 2021-02-19: qty 10

## 2021-02-19 MED ORDER — MIDAZOLAM HCL 2 MG/2ML IJ SOLN
2.0000 mg | Freq: Once | INTRAMUSCULAR | Status: AC
  Administered 2021-02-19: 2 mg via INTRAVENOUS

## 2021-02-19 MED ORDER — LACTATED RINGERS IV SOLN: INTRAVENOUS | Status: DC

## 2021-02-19 MED ORDER — 0.9 % SODIUM CHLORIDE (POUR BTL) OPTIME
TOPICAL | Status: DC | PRN
  Administered 2021-02-19: 1000 mL

## 2021-02-19 MED ORDER — DOCUSATE SODIUM 100 MG PO CAPS: 100.0000 mg | ORAL_CAPSULE | Freq: Two times a day (BID) | ORAL | 0 refills | Status: DC

## 2021-02-19 MED ORDER — PROPOFOL 500 MG/50ML IV EMUL
INTRAVENOUS | Status: DC | PRN
  Administered 2021-02-19: 75 ug/kg/min via INTRAVENOUS

## 2021-02-19 MED ORDER — ACETAMINOPHEN 160 MG/5ML PO SOLN
325.0000 mg | ORAL | Status: DC | PRN
Start: 2021-02-19 — End: 2021-02-19

## 2021-02-19 MED ORDER — OXYCODONE HCL 5 MG PO TABS: 5.0000 mg | ORAL_TABLET | Freq: Four times a day (QID) | ORAL | 0 refills | Status: AC | PRN

## 2021-02-19 MED ORDER — ACETAMINOPHEN 325 MG PO TABS: 325.0000 mg | ORAL_TABLET | ORAL | Status: DC | PRN

## 2021-02-19 MED ORDER — CEFAZOLIN SODIUM-DEXTROSE 2-4 GM/100ML-% IV SOLN
2.0000 g | INTRAVENOUS | Status: AC
  Administered 2021-02-19: 2 g via INTRAVENOUS

## 2021-02-19 MED ORDER — ORAL CARE MOUTH RINSE: 15.0000 mL | Freq: Once | OROMUCOSAL | Status: AC

## 2021-02-19 MED ORDER — DEXAMETHASONE SODIUM PHOSPHATE 10 MG/ML IJ SOLN
INTRAMUSCULAR | Status: DC | PRN
  Administered 2021-02-19: 5 mg via INTRAVENOUS

## 2021-02-19 MED ORDER — ONDANSETRON HCL 4 MG/2ML IJ SOLN
INTRAMUSCULAR | Status: AC
  Filled 2021-02-19: qty 2

## 2021-02-19 MED ORDER — PROMETHAZINE HCL 25 MG/ML IJ SOLN: 6.2500 mg | INTRAMUSCULAR | Status: DC | PRN

## 2021-02-19 MED ORDER — FENTANYL CITRATE (PF) 100 MCG/2ML IJ SOLN: 25.0000 ug | INTRAMUSCULAR | Status: DC | PRN

## 2021-02-19 MED ORDER — OXYCODONE HCL 5 MG/5ML PO SOLN
5.0000 mg | Freq: Once | ORAL | Status: DC | PRN
Start: 2021-02-19 — End: 2021-02-19

## 2021-02-19 MED ORDER — CEFAZOLIN SODIUM-DEXTROSE 2-4 GM/100ML-% IV SOLN
INTRAVENOUS | Status: AC
  Filled 2021-02-19: qty 100

## 2021-02-19 MED ORDER — FENTANYL CITRATE (PF) 100 MCG/2ML IJ SOLN
50.0000 ug | Freq: Once | INTRAMUSCULAR | Status: AC
  Administered 2021-02-19: 50 ug via INTRAVENOUS

## 2021-02-19 MED ORDER — ONDANSETRON HCL 4 MG/2ML IJ SOLN
INTRAMUSCULAR | Status: DC | PRN
  Administered 2021-02-19: 4 mg via INTRAVENOUS

## 2021-02-19 MED ORDER — CHLORHEXIDINE GLUCONATE 0.12 % MT SOLN
OROMUCOSAL | Status: AC
  Filled 2021-02-19: qty 15

## 2021-02-19 SURGICAL SUPPLY — 67 items
BAG COUNTER SPONGE SURGICOUNT (BAG) ×2 IMPLANT
BAG SPNG CNTER NS LX DISP (BAG) ×1
BIT DRILL 2.2 SS TIBIAL (BIT) ×1 IMPLANT
BLADE CLIPPER SURG (BLADE) IMPLANT
BNDG CMPR 9X4 STRL LF SNTH (GAUZE/BANDAGES/DRESSINGS) ×1
BNDG ELASTIC 3X5.8 VLCR STR LF (GAUZE/BANDAGES/DRESSINGS) ×2 IMPLANT
BNDG ELASTIC 4X5.8 VLCR STR LF (GAUZE/BANDAGES/DRESSINGS) ×2 IMPLANT
BNDG ESMARK 4X9 LF (GAUZE/BANDAGES/DRESSINGS) ×2 IMPLANT
BNDG GAUZE ELAST 4 BULKY (GAUZE/BANDAGES/DRESSINGS) ×2 IMPLANT
CORD BIPOLAR FORCEPS 12FT (ELECTRODE) ×2 IMPLANT
COVER SURGICAL LIGHT HANDLE (MISCELLANEOUS) ×2 IMPLANT
CUFF TOURN SGL QUICK 18X4 (TOURNIQUET CUFF) ×2 IMPLANT
CUFF TOURN SGL QUICK 24 (TOURNIQUET CUFF)
CUFF TRNQT CYL 24X4X16.5-23 (TOURNIQUET CUFF) IMPLANT
DRAIN TLS ROUND 10FR (DRAIN) IMPLANT
DRAPE OEC MINIVIEW 54X84 (DRAPES) ×2 IMPLANT
DRAPE SURG 17X11 SM STRL (DRAPES) ×2 IMPLANT
DRSG ADAPTIC 3X8 NADH LF (GAUZE/BANDAGES/DRESSINGS) ×2 IMPLANT
ELECT REM PT RETURN 9FT ADLT (ELECTROSURGICAL)
ELECTRODE REM PT RTRN 9FT ADLT (ELECTROSURGICAL) IMPLANT
GAUZE SPONGE 4X4 12PLY STRL (GAUZE/BANDAGES/DRESSINGS) ×2 IMPLANT
GAUZE SPONGE 4X4 12PLY STRL LF (GAUZE/BANDAGES/DRESSINGS) ×1 IMPLANT
GLOVE SURG ORTHO LTX SZ8 (GLOVE) ×2 IMPLANT
GLOVE SURG UNDER POLY LF SZ8.5 (GLOVE) ×2 IMPLANT
GOWN STRL REUS W/ TWL LRG LVL3 (GOWN DISPOSABLE) ×3 IMPLANT
GOWN STRL REUS W/ TWL XL LVL3 (GOWN DISPOSABLE) ×1 IMPLANT
GOWN STRL REUS W/TWL LRG LVL3 (GOWN DISPOSABLE) ×6
GOWN STRL REUS W/TWL XL LVL3 (GOWN DISPOSABLE) ×2
K-WIRE 1.6 (WIRE) ×2
K-WIRE FX5X1.6XNS BN SS (WIRE) ×1
KIT BASIN OR (CUSTOM PROCEDURE TRAY) ×2 IMPLANT
KIT TURNOVER KIT B (KITS) ×2 IMPLANT
KWIRE FX5X1.6XNS BN SS (WIRE) IMPLANT
MANIFOLD NEPTUNE II (INSTRUMENTS) IMPLANT
NDL HYPO 25X1 1.5 SAFETY (NEEDLE) ×1 IMPLANT
NEEDLE HYPO 25X1 1.5 SAFETY (NEEDLE) ×2 IMPLANT
NS IRRIG 1000ML POUR BTL (IV SOLUTION) ×2 IMPLANT
PACK ORTHO EXTREMITY (CUSTOM PROCEDURE TRAY) ×2 IMPLANT
PAD ARMBOARD 7.5X6 YLW CONV (MISCELLANEOUS) ×4 IMPLANT
PAD CAST 4YDX4 CTTN HI CHSV (CAST SUPPLIES) ×1 IMPLANT
PADDING CAST COTTON 4X4 STRL (CAST SUPPLIES) ×4
PEG LOCKING SMOOTH 2.2X16 (Screw) ×1 IMPLANT
PEG LOCKING SMOOTH 2.2X18 (Peg) ×2 IMPLANT
PEG LOCKING SMOOTH 2.2X20 (Screw) ×6 IMPLANT
PEG LOCKING SMOOTH 2.2X22 (Screw) ×1 IMPLANT
PLATE STANDARD DVR LEFT (Plate) ×2 IMPLANT
PLATE STD DVR LT 24X51 (Plate) IMPLANT
SCREW LOCK 12X2.7X 3 LD (Screw) IMPLANT
SCREW LOCK 14X2.7X 3 LD TPR (Screw) IMPLANT
SCREW LOCK 18X2.7X 3 LD TPR (Screw) IMPLANT
SCREW LOCKING 2.7X12MM (Screw) ×4 IMPLANT
SCREW LOCKING 2.7X14 (Screw) ×6 IMPLANT
SCREW LOCKING 2.7X18 (Screw) ×2 IMPLANT
SLING ARM FOAM STRAP MED (SOFTGOODS) ×1 IMPLANT
SOAP 2 % CHG 4 OZ (WOUND CARE) ×2 IMPLANT
SPLINT FIBERGLASS 3X12 (CAST SUPPLIES) ×1 IMPLANT
SPLINT FIBERGLASS 3X35 (CAST SUPPLIES) ×1 IMPLANT
SUT MNCRL AB 3-0 PS2 27 (SUTURE) ×1 IMPLANT
SUT PROLENE 4 0 PS 2 18 (SUTURE) ×1 IMPLANT
SUT VIC AB 2-0 FS1 27 (SUTURE) ×1 IMPLANT
SUT VICRYL 4-0 PS2 18IN ABS (SUTURE) IMPLANT
SYR CONTROL 10ML LL (SYRINGE) IMPLANT
SYSTEM CHEST DRAIN TLS 7FR (DRAIN) IMPLANT
TOWEL GREEN STERILE (TOWEL DISPOSABLE) ×2 IMPLANT
TOWEL GREEN STERILE FF (TOWEL DISPOSABLE) ×2 IMPLANT
TUBE CONNECTING 12X1/4 (SUCTIONS) ×2 IMPLANT
WATER STERILE IRR 1000ML POUR (IV SOLUTION) ×2 IMPLANT

## 2021-02-19 NOTE — Anesthesia Postprocedure Evaluation (Signed)
Anesthesia Post Note  Patient: [name]  Procedure(s) Performed: Left wrist open reduction internal fixation and repair as indicated (Left: Wrist)     Patient location during evaluation: PACU Anesthesia Type: General Level of consciousness: awake and alert Pain management: pain level controlled Vital Signs Assessment: post-procedure vital signs reviewed and stable Respiratory status: spontaneous breathing, nonlabored ventilation, respiratory function stable and patient connected to nasal cannula oxygen Cardiovascular status: blood pressure returned to baseline and stable Postop Assessment: no apparent nausea or vomiting Anesthetic complications: no Comments: Converted to GA due to block not being fully set up. No pain in PACU.   No notable events documented.  Last Vitals:  Vitals:   02/19/21 1229 02/19/21 1244  BP: 116/75 125/80  Pulse: 85 88  Resp: 16 20  Temp:  36.5 C  SpO2: 97% 95%    Last Pain:  Vitals:   02/19/21 1244  TempSrc:   PainSc: 0-No pain                 Effie Berkshire

## 2021-02-19 NOTE — Transfer of Care (Signed)
Immediate Anesthesia Transfer of Care Note  Patient: JAVIA DILLOW  Procedure(s) Performed: Left wrist open reduction internal fixation and repair as indicated (Left: Wrist)  Patient Location: PACU  Anesthesia Type:GA combined with regional for post-op pain  Level of Consciousness: drowsy and patient cooperative  Airway & Oxygen Therapy: Patient Spontanous Breathing and Patient connected to nasal cannula oxygen  Post-op Assessment: Report given to RN, Post -op Vital signs reviewed and stable and Patient moving all extremities  Post vital signs: Reviewed and stable  Last Vitals:  Vitals Value Taken Time  BP 109/57 02/19/21 1214  Temp    Pulse 86 02/19/21 1216  Resp 21 02/19/21 1216  SpO2 100 % 02/19/21 1216  Vitals shown include unvalidated device data.  Last Pain:  Vitals:   02/19/21 1050  TempSrc:   PainSc: 0-No pain      Patients Stated Pain Goal: 4 (12/45/80 9983)  Complications: No notable events documented.

## 2021-02-19 NOTE — Discharge Instructions (Signed)
KEEP BANDAGE CLEAN AND DRY CALL OFFICE FOR F/U APPT [phone] in 13 days KEEP HAND ELEVATED ABOVE HEART OK TO APPLY ICE TO OPERATIVE AREA CONTACT OFFICE IF ANY WORSENING PAIN OR CONCERNS.

## 2021-02-19 NOTE — Op Note (Signed)
PREOPERATIVE DIAGNOSIS: Left wrist intra-articular distal radius fracture 3 more fragments   POSTOPERATIVE DIAGNOSIS: Same   ATTENDING SURGEON: Dr. Iran Planas who scrubbed and present for the entire procedure   ASSISTANT SURGEON: None   ANESTHESIA: Regional with IV sedation   OPERATIVE PROCEDURE: Open treatment of left wrist intra-articular distal radius fracture 3 more fragments Left wrist brachioradialis tendon release, tendon tenotomy Radiographs 3 views left wrist   IMPLANTS: [employer] standard DVR cross lock   EBL: Minimal   RADIOGRAPHIC INTERPRETATION: AP lateral oblique views of the wrist do show the volar plate fixation in place there is good alignment in all planes   SURGICAL INDICATIONS: Patient is a left-hand-dominant female who sustained a closed injury to her left distal radius.  Patient was seen evaluate in the office and recommended undergo the above procedure.  The risks of surgery include but not limited to bleeding infection damage nearby nerves arteries or tendons loss of motion of the wrist and digits incomplete relief of symptoms and need for further surgical invention.  Signed informed consent was obtained on the day of surgery.   SURGICAL TECHNIQUE: Patient was palpated via the preoperative holding area marked apart a marker made on the left wrist indicate correct operative site.  Patient brought back operating placed supine on the anesthesia table where the regional anesthetic was administered.  Patient tolerated this well.  Well-padded Tourniquet was then placed on the left brachium and stay with the appropriate drape.  The right upper extremities then prepped and draped in normal sterile fashion.  A timeout was called the correct site identified procedure then begun.  Attention was then turned to the left wrist.  The limb was then elevated using Esmarch segmentation the tourniquet insufflated.  Longitudinal incision made directly over the FCR sheath.  Dissection carried  down through the skin and subcutaneous tissue.  The FCR sheath was opened proximally distally.  The FPL was swept all the way and the pronator quadratus was then elevated in an L-shaped fashion.  The fracture site was then exposed.  Takedown of the early fracture callus was then done.  This was an intra-articular fracture of 3 more fragments.  In order to reduce the radial column the brachioradialis was then carefully released and tendon tenotomy and release was then done off the radial styloid.  The proximal segment of the fracture was then pronated allowing for recreation of the fracture site and takedown of the dorsal periosteal bone.  Once takedown of the very early nascent malunion was done attention was then turned to internal fixation.  The volar plate was then applied.  Open reduction of the intra-articular fracture was then carried out.  It was held distally with a K wire.  Plate position was then confirmed using the mini C arm.  The oblong screw hole was then placed proximally.  After placement of the oblong screw hole distal fixation was then carried out with a combination of distal locking pegs and screws from an ulnar to radial direction.  The positions were then confirmed using the mini C arm looking specifically down the joint.  After distal fixation was achieved proximal fixation was carried out with several more locking and nonlocking screws.  The wound was then thoroughly irrigated.  Final radiographs were then obtained.  The pronator quadratus was then closed with 2-0 Vicryl.  The subcutaneous tissues closed with 3-0 Monocryl and the skin was then closed with simple 4-0 Prolene.  Adaptic dressing a sterile compressive bandage then applied.  The patient was placed in a well-padded volar splint taken recovery in good condition.   POSTOPERATIVE PLAN: Patient be discharged to home.  See him back in the office in 2 weeks for wound check suture removal x-rays application of a short arm cast.  Placed  the therapy order the first postoperative visit begin the therapy regimen 4 weeks.  Radiographs at each visit.

## 2021-02-19 NOTE — Anesthesia Preprocedure Evaluation (Addendum)
Anesthesia Evaluation  Patient identified by MRN, date of birth, ID band Patient awake    Reviewed: Allergy & Precautions, NPO status , Patient's Chart, lab work & pertinent test results  History of Anesthesia Complications (+) history of anesthetic complications  Airway Mallampati: II  TM Distance: >3 FB Neck ROM: Full    Dental  (+) Teeth Intact, Dental Advisory Given   Pulmonary former smoker,    breath sounds clear to auscultation       Cardiovascular negative cardio ROS   Rhythm:Regular Rate:Normal     Neuro/Psych PSYCHIATRIC DISORDERS Anxiety Depression    GI/Hepatic Neg liver ROS, hiatal hernia, GERD  Medicated,  Endo/Other  negative endocrine ROS  Renal/GU negative Renal ROS     Musculoskeletal negative musculoskeletal ROS (+)   Abdominal Normal abdominal exam  (+)   Peds  Hematology negative hematology ROS (+)   Anesthesia Other Findings   Reproductive/Obstetrics                            Anesthesia Physical Anesthesia Plan  ASA: 2  Anesthesia Plan: Regional and MAC   Post-op Pain Management:    Induction: Intravenous  PONV Risk Score and Plan: 3 and Ondansetron, Propofol infusion, Midazolam and Dexamethasone  Airway Management Planned: Natural Airway and Simple Face Mask  Additional Equipment: None  Intra-op Plan:   Post-operative Plan:   Informed Consent: I have reviewed the patients History and Physical, chart, labs and discussed the procedure including the risks, benefits and alternatives for the proposed anesthesia with the patient or authorized representative who has indicated his/her understanding and acceptance.       Plan Discussed with: CRNA  Anesthesia Plan Comments:        Anesthesia Quick Evaluation

## 2021-02-19 NOTE — Anesthesia Procedure Notes (Signed)
Procedure Name: LMA Insertion Date/Time: 02/19/2021 11:17 AM Performed by: Moshe Salisbury, CRNA Pre-anesthesia Checklist: Patient identified, Emergency Drugs available, Suction available and Patient being monitored Patient Re-evaluated:Patient Re-evaluated prior to induction Oxygen Delivery Method: Circle System Utilized Preoxygenation: Pre-oxygenation with 100% oxygen Induction Type: IV induction Ventilation: Mask ventilation without difficulty LMA: LMA inserted LMA Size: 4.0 Number of attempts: 1 Placement Confirmation: positive ETCO2 Tube secured with: Tape Dental Injury: Teeth and Oropharynx as per pre-operative assessment

## 2021-02-19 NOTE — Anesthesia Procedure Notes (Signed)
Anesthesia Regional Block: Supraclavicular block   Pre-Anesthetic Checklist: , timeout performed,  Correct Patient, Correct Site, Correct Laterality,  Correct Procedure, Correct Position, site marked,  Risks and benefits discussed,  Surgical consent,  Pre-op evaluation,  At surgeon's request and post-op pain management  Laterality: Left  Prep: chloraprep       Needles:  Injection technique: Single-shot  Needle Type: Echogenic Stimulator Needle     Needle Length: 9cm  Needle Gauge: 21     Additional Needles:   Procedures:,,,, ultrasound used (permanent image in chart),,    Narrative:  Start time: 02/19/2021 10:30 AM End time: 02/19/2021 10:35 AM Injection made incrementally with aspirations every 5 mL.  Performed by: Personally  Anesthesiologist: Effie Berkshire, MD  Additional Notes: Patient tolerated the procedure well. Local anesthetic introduced in an incremental fashion under minimal resistance after negative aspirations. No paresthesias were elicited. After completion of the procedure, no acute issues were identified and patient continued to be monitored by RN.

## 2021-02-21 ENCOUNTER — Ambulatory Visit (HOSPITAL_BASED_OUTPATIENT_CLINIC_OR_DEPARTMENT_OTHER): Admission: RE | Admit: 2021-02-21 | Payer: 59 | Source: Home / Self Care | Admitting: Orthopedic Surgery

## 2021-02-21 SURGERY — OPEN REDUCTION INTERNAL FIXATION (ORIF) DISTAL RADIUS FRACTURE
Anesthesia: Monitor Anesthesia Care | Laterality: Left

## 2021-02-22 ENCOUNTER — Encounter (HOSPITAL_COMMUNITY): Payer: Self-pay | Admitting: Orthopedic Surgery

## 2021-02-23 ENCOUNTER — Ambulatory Visit: Payer: 59 | Admitting: Gastroenterology

## 2021-02-24 ENCOUNTER — Other Ambulatory Visit (HOSPITAL_COMMUNITY): Payer: Self-pay

## 2021-02-28 ENCOUNTER — Other Ambulatory Visit (HOSPITAL_COMMUNITY): Payer: Self-pay

## 2021-03-01 ENCOUNTER — Other Ambulatory Visit (HOSPITAL_COMMUNITY): Payer: Self-pay

## 2021-03-04 DIAGNOSIS — Z4789 Encounter for other orthopedic aftercare: Secondary | ICD-10-CM | POA: Diagnosis not present

## 2021-03-04 DIAGNOSIS — S6292XD Unspecified fracture of left wrist and hand, subsequent encounter for fracture with routine healing: Secondary | ICD-10-CM | POA: Diagnosis not present

## 2021-03-04 DIAGNOSIS — S6292XA Unspecified fracture of left wrist and hand, initial encounter for closed fracture: Secondary | ICD-10-CM | POA: Diagnosis not present

## 2021-03-18 DIAGNOSIS — M25532 Pain in left wrist: Secondary | ICD-10-CM | POA: Diagnosis not present

## 2021-03-21 DIAGNOSIS — M25532 Pain in left wrist: Secondary | ICD-10-CM | POA: Diagnosis not present

## 2021-03-24 DIAGNOSIS — M25532 Pain in left wrist: Secondary | ICD-10-CM | POA: Diagnosis not present

## 2021-03-28 DIAGNOSIS — M25532 Pain in left wrist: Secondary | ICD-10-CM | POA: Diagnosis not present

## 2021-04-01 ENCOUNTER — Other Ambulatory Visit (HOSPITAL_COMMUNITY): Payer: Self-pay

## 2021-04-01 ENCOUNTER — Ambulatory Visit (HOSPITAL_BASED_OUTPATIENT_CLINIC_OR_DEPARTMENT_OTHER): Payer: 59 | Admitting: Cardiovascular Disease

## 2021-04-01 DIAGNOSIS — M25532 Pain in left wrist: Secondary | ICD-10-CM | POA: Diagnosis not present

## 2021-04-04 ENCOUNTER — Other Ambulatory Visit (HOSPITAL_COMMUNITY): Payer: Self-pay

## 2021-04-05 DIAGNOSIS — M25532 Pain in left wrist: Secondary | ICD-10-CM | POA: Diagnosis not present

## 2021-04-07 ENCOUNTER — Other Ambulatory Visit (HOSPITAL_COMMUNITY): Payer: Self-pay

## 2021-04-07 MED ORDER — AMANTADINE HCL 100 MG PO CAPS
100.0000 mg | ORAL_CAPSULE | Freq: Every day | ORAL | 3 refills | Status: DC
  Filled 2021-04-07 – 2021-05-09 (×3): qty 30, 30d supply, fill #0
  Filled 2022-01-17: qty 30, 30d supply, fill #1

## 2021-04-07 MED ORDER — PRAZOSIN HCL 2 MG PO CAPS
2.0000 mg | ORAL_CAPSULE | Freq: Every day | ORAL | 3 refills | Status: DC
  Filled 2021-04-07 – 2022-01-17 (×2): qty 30, 30d supply, fill #0
  Filled 2022-02-21: qty 30, 30d supply, fill #1
  Filled 2022-03-21: qty 30, 30d supply, fill #2

## 2021-04-07 MED ORDER — GABAPENTIN 300 MG PO CAPS
300.0000 mg | ORAL_CAPSULE | Freq: Every evening | ORAL | 3 refills | Status: DC
  Filled 2021-04-07 – 2021-04-26 (×2): qty 30, 30d supply, fill #0

## 2021-04-07 MED ORDER — ESZOPICLONE 3 MG PO TABS
3.0000 mg | ORAL_TABLET | Freq: Every evening | ORAL | 2 refills | Status: DC
  Filled 2021-04-07: qty 30, 30d supply, fill #0

## 2021-04-08 DIAGNOSIS — M25532 Pain in left wrist: Secondary | ICD-10-CM | POA: Diagnosis not present

## 2021-04-12 DIAGNOSIS — S6292XA Unspecified fracture of left wrist and hand, initial encounter for closed fracture: Secondary | ICD-10-CM | POA: Diagnosis not present

## 2021-04-12 DIAGNOSIS — M25532 Pain in left wrist: Secondary | ICD-10-CM | POA: Diagnosis not present

## 2021-04-12 DIAGNOSIS — Z4789 Encounter for other orthopedic aftercare: Secondary | ICD-10-CM | POA: Diagnosis not present

## 2021-04-18 DIAGNOSIS — M25532 Pain in left wrist: Secondary | ICD-10-CM | POA: Diagnosis not present

## 2021-04-22 DIAGNOSIS — M25532 Pain in left wrist: Secondary | ICD-10-CM | POA: Diagnosis not present

## 2021-04-25 DIAGNOSIS — M25532 Pain in left wrist: Secondary | ICD-10-CM | POA: Diagnosis not present

## 2021-04-26 ENCOUNTER — Other Ambulatory Visit (HOSPITAL_BASED_OUTPATIENT_CLINIC_OR_DEPARTMENT_OTHER): Payer: Self-pay

## 2021-04-26 DIAGNOSIS — F332 Major depressive disorder, recurrent severe without psychotic features: Secondary | ICD-10-CM | POA: Diagnosis not present

## 2021-04-26 MED ORDER — INFLUENZA VAC A&B SA ADJ QUAD 0.5 ML IM PRSY
PREFILLED_SYRINGE | INTRAMUSCULAR | 0 refills | Status: DC
  Filled 2021-04-26: qty 0.5, 1d supply, fill #0

## 2021-04-26 MED ORDER — PRAZOSIN HCL 2 MG PO CAPS
2.0000 mg | ORAL_CAPSULE | Freq: Every day | ORAL | 1 refills | Status: DC
  Filled 2021-05-09: qty 90, 90d supply, fill #0
  Filled 2021-07-25: qty 90, 90d supply, fill #1

## 2021-04-26 MED ORDER — GABAPENTIN 600 MG PO TABS
600.0000 mg | ORAL_TABLET | ORAL | 1 refills | Status: DC
  Filled 2021-05-09: qty 90, 90d supply, fill #0
  Filled 2021-07-25: qty 90, 90d supply, fill #1

## 2021-04-26 MED ORDER — ESZOPICLONE 2 MG PO TABS
2.0000 mg | ORAL_TABLET | ORAL | 3 refills | Status: DC
  Filled 2021-05-09: qty 30, 30d supply, fill #0
  Filled 2021-06-04: qty 30, 30d supply, fill #1
  Filled 2021-07-18: qty 30, 30d supply, fill #2
  Filled 2021-09-08: qty 30, 30d supply, fill #3

## 2021-04-26 MED ORDER — LAMOTRIGINE 150 MG PO TABS
150.0000 mg | ORAL_TABLET | Freq: Every day | ORAL | 1 refills | Status: DC
  Filled 2021-07-18: qty 90, 90d supply, fill #0

## 2021-04-27 ENCOUNTER — Other Ambulatory Visit (HOSPITAL_BASED_OUTPATIENT_CLINIC_OR_DEPARTMENT_OTHER): Payer: Self-pay

## 2021-04-29 DIAGNOSIS — M25532 Pain in left wrist: Secondary | ICD-10-CM | POA: Diagnosis not present

## 2021-05-02 DIAGNOSIS — M25532 Pain in left wrist: Secondary | ICD-10-CM | POA: Diagnosis not present

## 2021-05-06 DIAGNOSIS — M25532 Pain in left wrist: Secondary | ICD-10-CM | POA: Diagnosis not present

## 2021-05-09 ENCOUNTER — Encounter (HOSPITAL_BASED_OUTPATIENT_CLINIC_OR_DEPARTMENT_OTHER): Payer: Self-pay | Admitting: Pharmacist

## 2021-05-09 ENCOUNTER — Other Ambulatory Visit: Payer: Self-pay | Admitting: Cardiovascular Disease

## 2021-05-09 ENCOUNTER — Ambulatory Visit: Payer: 59 | Attending: Internal Medicine

## 2021-05-09 ENCOUNTER — Other Ambulatory Visit (HOSPITAL_BASED_OUTPATIENT_CLINIC_OR_DEPARTMENT_OTHER): Payer: Self-pay

## 2021-05-09 DIAGNOSIS — M25532 Pain in left wrist: Secondary | ICD-10-CM | POA: Diagnosis not present

## 2021-05-09 DIAGNOSIS — Z23 Encounter for immunization: Secondary | ICD-10-CM

## 2021-05-09 MED ORDER — ROSUVASTATIN CALCIUM 5 MG PO TABS
ORAL_TABLET | ORAL | 0 refills | Status: DC
  Filled 2021-05-09: qty 45, 90d supply, fill #0

## 2021-05-09 MED ORDER — PFIZER COVID-19 VAC BIVALENT 30 MCG/0.3ML IM SUSP
INTRAMUSCULAR | 0 refills | Status: DC
Start: 2021-05-09 — End: 2022-05-16
  Filled 2021-05-09: qty 0.3, 1d supply, fill #0

## 2021-05-09 NOTE — Progress Notes (Signed)
   Covid-19 Vaccination Clinic  Name:  DEBORHA MOSELEY    MRN: [id number] DOB: [birth year]  05/09/2021  Ms. Capelle was observed post Covid-19 immunization for 15 minutes without incident. She was provided with Vaccine Information Sheet and instruction to access the V-Safe system.   Ms. Curto was instructed to call 911 with any severe reactions post vaccine: Difficulty breathing  Swelling of face and throat  A fast heartbeat  A bad rash all over body  Dizziness and weakness   Immunizations Administered     Name Date Dose VIS Date Route   Pfizer Covid-19 Vaccine Bivalent Booster 05/09/2021 11:00 AM 0.3 mL 04/06/2021 Intramuscular   Manufacturer: [place]   Lot: [id number]   [place]: [phone]

## 2021-05-10 ENCOUNTER — Other Ambulatory Visit (HOSPITAL_BASED_OUTPATIENT_CLINIC_OR_DEPARTMENT_OTHER): Payer: Self-pay

## 2021-05-10 DIAGNOSIS — S6292XA Unspecified fracture of left wrist and hand, initial encounter for closed fracture: Secondary | ICD-10-CM | POA: Diagnosis not present

## 2021-05-10 DIAGNOSIS — Z4789 Encounter for other orthopedic aftercare: Secondary | ICD-10-CM | POA: Diagnosis not present

## 2021-05-11 DIAGNOSIS — M25532 Pain in left wrist: Secondary | ICD-10-CM | POA: Diagnosis not present

## 2021-05-20 ENCOUNTER — Ambulatory Visit (HOSPITAL_BASED_OUTPATIENT_CLINIC_OR_DEPARTMENT_OTHER): Payer: 59 | Admitting: Cardiovascular Disease

## 2021-05-20 DIAGNOSIS — M25532 Pain in left wrist: Secondary | ICD-10-CM | POA: Diagnosis not present

## 2021-06-01 DIAGNOSIS — M25532 Pain in left wrist: Secondary | ICD-10-CM | POA: Diagnosis not present

## 2021-06-06 ENCOUNTER — Other Ambulatory Visit (HOSPITAL_BASED_OUTPATIENT_CLINIC_OR_DEPARTMENT_OTHER): Payer: Self-pay

## 2021-06-07 ENCOUNTER — Other Ambulatory Visit (HOSPITAL_BASED_OUTPATIENT_CLINIC_OR_DEPARTMENT_OTHER): Payer: Self-pay

## 2021-06-09 DIAGNOSIS — Z4789 Encounter for other orthopedic aftercare: Secondary | ICD-10-CM | POA: Diagnosis not present

## 2021-06-09 DIAGNOSIS — S6292XA Unspecified fracture of left wrist and hand, initial encounter for closed fracture: Secondary | ICD-10-CM | POA: Diagnosis not present

## 2021-06-09 DIAGNOSIS — M25532 Pain in left wrist: Secondary | ICD-10-CM | POA: Diagnosis not present

## 2021-06-29 DIAGNOSIS — F332 Major depressive disorder, recurrent severe without psychotic features: Secondary | ICD-10-CM | POA: Diagnosis not present

## 2021-07-08 DIAGNOSIS — S52522A Torus fracture of lower end of left radius, initial encounter for closed fracture: Secondary | ICD-10-CM | POA: Diagnosis not present

## 2021-07-11 ENCOUNTER — Other Ambulatory Visit (HOSPITAL_BASED_OUTPATIENT_CLINIC_OR_DEPARTMENT_OTHER): Payer: Self-pay

## 2021-07-18 ENCOUNTER — Other Ambulatory Visit (HOSPITAL_BASED_OUTPATIENT_CLINIC_OR_DEPARTMENT_OTHER): Payer: Self-pay

## 2021-07-25 ENCOUNTER — Other Ambulatory Visit: Payer: Self-pay | Admitting: Cardiovascular Disease

## 2021-07-25 ENCOUNTER — Other Ambulatory Visit (HOSPITAL_BASED_OUTPATIENT_CLINIC_OR_DEPARTMENT_OTHER): Payer: Self-pay

## 2021-07-25 MED ORDER — ROSUVASTATIN CALCIUM 5 MG PO TABS
ORAL_TABLET | ORAL | 3 refills | Status: DC
  Filled 2021-07-25: qty 90, 90d supply, fill #0

## 2021-07-26 ENCOUNTER — Other Ambulatory Visit (HOSPITAL_BASED_OUTPATIENT_CLINIC_OR_DEPARTMENT_OTHER): Payer: Self-pay

## 2021-08-02 ENCOUNTER — Other Ambulatory Visit (HOSPITAL_BASED_OUTPATIENT_CLINIC_OR_DEPARTMENT_OTHER): Payer: Self-pay

## 2021-09-05 ENCOUNTER — Other Ambulatory Visit (HOSPITAL_BASED_OUTPATIENT_CLINIC_OR_DEPARTMENT_OTHER): Payer: Self-pay

## 2021-09-05 ENCOUNTER — Telehealth: Payer: 59 | Admitting: Physician Assistant

## 2021-09-05 DIAGNOSIS — H109 Unspecified conjunctivitis: Secondary | ICD-10-CM

## 2021-09-05 MED ORDER — POLYMYXIN B-TRIMETHOPRIM 10000-0.1 UNIT/ML-% OP SOLN
OPHTHALMIC | 0 refills | Status: DC
  Filled 2021-09-05 – 2021-09-09 (×2): qty 10, 5d supply, fill #0

## 2021-09-05 NOTE — Progress Notes (Signed)

## 2021-09-08 ENCOUNTER — Other Ambulatory Visit (HOSPITAL_BASED_OUTPATIENT_CLINIC_OR_DEPARTMENT_OTHER): Payer: Self-pay

## 2021-09-09 ENCOUNTER — Other Ambulatory Visit (HOSPITAL_BASED_OUTPATIENT_CLINIC_OR_DEPARTMENT_OTHER): Payer: Self-pay

## 2021-09-15 ENCOUNTER — Other Ambulatory Visit (HOSPITAL_COMMUNITY): Payer: Self-pay

## 2021-09-15 DIAGNOSIS — H0100B Unspecified blepharitis left eye, upper and lower eyelids: Secondary | ICD-10-CM | POA: Diagnosis not present

## 2021-09-15 DIAGNOSIS — H10411 Chronic giant papillary conjunctivitis, right eye: Secondary | ICD-10-CM | POA: Diagnosis not present

## 2021-09-15 DIAGNOSIS — H0100A Unspecified blepharitis right eye, upper and lower eyelids: Secondary | ICD-10-CM | POA: Diagnosis not present

## 2021-09-15 DIAGNOSIS — H04123 Dry eye syndrome of bilateral lacrimal glands: Secondary | ICD-10-CM | POA: Diagnosis not present

## 2021-09-15 MED ORDER — PREDNISOLONE ACETATE 1 % OP SUSP
OPHTHALMIC | 0 refills | Status: DC
  Filled 2021-09-15: qty 5, 14d supply, fill #0

## 2021-09-22 DIAGNOSIS — H01001 Unspecified blepharitis right upper eyelid: Secondary | ICD-10-CM | POA: Diagnosis not present

## 2021-09-22 DIAGNOSIS — H11152 Pinguecula, left eye: Secondary | ICD-10-CM | POA: Diagnosis not present

## 2021-09-22 DIAGNOSIS — H01004 Unspecified blepharitis left upper eyelid: Secondary | ICD-10-CM | POA: Diagnosis not present

## 2021-09-22 DIAGNOSIS — H10413 Chronic giant papillary conjunctivitis, bilateral: Secondary | ICD-10-CM | POA: Diagnosis not present

## 2021-10-10 ENCOUNTER — Other Ambulatory Visit (HOSPITAL_BASED_OUTPATIENT_CLINIC_OR_DEPARTMENT_OTHER): Payer: Self-pay

## 2021-10-10 MED ORDER — TETANUS-DIPHTH-ACELL PERTUSSIS 5-2.5-18.5 LF-MCG/0.5 IM SUSY
PREFILLED_SYRINGE | INTRAMUSCULAR | 0 refills | Status: DC
  Filled 2021-10-10: qty 0.5, 1d supply, fill #0

## 2021-10-18 ENCOUNTER — Other Ambulatory Visit (HOSPITAL_BASED_OUTPATIENT_CLINIC_OR_DEPARTMENT_OTHER): Payer: Self-pay

## 2021-10-18 MED ORDER — GABAPENTIN 600 MG PO TABS
ORAL_TABLET | ORAL | 1 refills | Status: DC
  Filled 2021-10-18: qty 90, 90d supply, fill #0
  Filled 2022-01-17: qty 90, 90d supply, fill #1

## 2021-10-18 MED ORDER — PRAZOSIN HCL 2 MG PO CAPS
ORAL_CAPSULE | ORAL | 1 refills | Status: DC
  Filled 2021-10-18: qty 90, 90d supply, fill #0

## 2021-10-18 MED ORDER — LAMOTRIGINE 150 MG PO TABS
ORAL_TABLET | ORAL | 1 refills | Status: DC
  Filled 2021-10-18: qty 90, 90d supply, fill #0

## 2021-10-18 MED ORDER — ESZOPICLONE 2 MG PO TABS
ORAL_TABLET | ORAL | 3 refills | Status: DC
  Filled 2021-10-18: qty 30, 30d supply, fill #0
  Filled 2021-11-18: qty 30, 30d supply, fill #1
  Filled 2021-12-22: qty 30, 30d supply, fill #2
  Filled 2022-01-17: qty 30, 30d supply, fill #3

## 2021-10-20 ENCOUNTER — Other Ambulatory Visit (HOSPITAL_BASED_OUTPATIENT_CLINIC_OR_DEPARTMENT_OTHER): Payer: Self-pay

## 2021-10-27 ENCOUNTER — Telehealth: Payer: Self-pay | Admitting: Cardiovascular Disease

## 2021-10-27 DIAGNOSIS — E785 Hyperlipidemia, unspecified: Secondary | ICD-10-CM

## 2021-10-27 DIAGNOSIS — Z5181 Encounter for therapeutic drug level monitoring: Secondary | ICD-10-CM

## 2021-10-27 DIAGNOSIS — Z79899 Other long term (current) drug therapy: Secondary | ICD-10-CM

## 2021-10-27 NOTE — Telephone Encounter (Signed)
Patient requests lab orders be put in so she can get lab work prior to her appointment 3/29.  ?

## 2021-10-27 NOTE — Telephone Encounter (Signed)
Prior to her appointment please have her come in for lab work.  Please order CBC, CMP, and fasting lipids.  Thank you. ? ?Jossie Ng. Jaeshaun Riva NP-C ? ?  ?10/27/2021, 4:32 PM ?[place] ?[address] ?Office [phone] Fax [phone] ? ?

## 2021-10-28 ENCOUNTER — Telehealth: Payer: Medicare Other | Admitting: Nurse Practitioner

## 2021-10-28 DIAGNOSIS — R399 Unspecified symptoms and signs involving the genitourinary system: Secondary | ICD-10-CM

## 2021-10-28 MED ORDER — CEPHALEXIN 500 MG PO CAPS: 500.0000 mg | ORAL_CAPSULE | Freq: Two times a day (BID) | ORAL | 0 refills | Status: DC

## 2021-10-28 NOTE — Progress Notes (Signed)

## 2021-10-28 NOTE — Telephone Encounter (Signed)
Pt informed of providers result & recommendations. Pt verbalized understanding. No further questions . ?She will come in fasting Monday for requested lab. She states that she received a letter stating that she has to "pre-pay over $100" for her upcoming appt next week. I informed pt that I have not heard about this and to make sure to bring with her to her appt so we can figure this out with her. Verbalized understanding, she will bring this with her. ?

## 2021-10-31 DIAGNOSIS — E785 Hyperlipidemia, unspecified: Secondary | ICD-10-CM | POA: Diagnosis not present

## 2021-10-31 DIAGNOSIS — Z5181 Encounter for therapeutic drug level monitoring: Secondary | ICD-10-CM | POA: Diagnosis not present

## 2021-10-31 NOTE — Progress Notes (Signed)
? ?Cardiology Office Note:   ? ?Date:  11/02/2021  ? ?ID:  [name], DOB [birth year], MRN [id number] ? ?PCP:  Jo Lung, MD ?  ?[name] Providers ?Cardiologist:  Jo Latch, MD  ?   ? ?Referring MD: Jo Lung, MD  ? ?Follow-up for exertional dyspnea and hyperlipidemia ? ?History of Present Illness:   ? ?[name] is a 71 y.o. female with a hx of familial hyperlipidemia, prediabetes, GERD, hiatal hernia. ? ?She was initially seen by Dr. Oval [name] on 12/20 for management of hyperlipidemia.  She had been unable to tolerate statin therapy due to myalgias.  Her PCP had placed her on atorvastatin which she did not start.  She was concerned about potential side effects.  During her appointment there was also concern for exertional dyspnea so she was referred for coronary CTA.  She was noted to have a normal coronaries and calcium score of 0.  However, she was noted to have a moderate hiatal hernia.  Plans were made to follow-up with Dr. Kipp [name] for surgical repair.  She reported feeling fairly well.  She did have frequent episodes of GERD.  She reported even feeling reflux with drinking water.  She was avoiding eating large meals.  She denied lower extremity swelling orthopnea and PND.  She had received both COVID vaccinations and was planning to start going to the gym.  She had not been getting much exercise.  She denied exertional symptoms. ? ?She presents to the clinic today for follow-up evaluation states she has had a stressful year.  She reports that her father passed away 10 days ago.  She was offered referral for further grief support which she has deferred at this time.We reviewed her lab work from 10/31/2021.  Her AST and ALT are elevated.  She reports that she has been drinking 3-4 alcoholic beverages nightly.  She reports that she was working out regularly at the gym 4 days/week until about 2 weeks ago when her father passed away.  She is planning to return to the gym to  increase her physical activity.  We reviewed the risks of liver cirrhosis.  She expressed understanding.  I will repeat her LFTs in 2 months, and plan follow-up for 12 months.   ? ?Today she denies chest pain, shortness of breath, lower extremity edema, fatigue, palpitations, melena, hematuria, hemoptysis, diaphoresis, weakness, presyncope, syncope, orthopnea, and PND. ? ?Past Medical History:  ?Diagnosis Date  ? Anxiety   ? Complication of anesthesia   ? past hx. laryngospasm following 2 past surgeries, not recent  ? Depression   ? Exertional dyspnea 08/04/2019  ? GERD (gastroesophageal reflux disease)   ? Hiatal hernia 10/17/2019  ? Hyperlipidemia   ? Insomnia   ? periodically uses med.  ? Perforated sigmoid colon ([place]) 04/28/2014  ? during colonoscopy, "rupture colon"became septic, has colostomy and open surgical wound  ? Prediabetes 08/09/2017  ? Suicide attempt [facility])   ? Vitamin D deficiency   ? ? ?Past Surgical History:  ?Procedure Laterality Date  ? ABDOMINAL HYSTERECTOMY  1999  ? TAH/BSO  ? APPLICATION OF WOUND VAC  04/28/2014  ? Procedure: APPLICATION OF WOUND VAC;  Surgeon: Rolm Bookbinder, MD;  Location: [place];  Service: General;;  ? BLEPHAROPLASTY  2010  ? BREAST SURGERY  2001  ? Breast reduction  ? COLOSTOMY N/A 04/28/2014  ? Procedure: COLOSTOMY;  Surgeon: Rolm Bookbinder, MD;  Location: [place];  Service: General;  Laterality: N/A;  ? COLOSTOMY  REVISION N/A 04/28/2014  ? Procedure: COLON RESECTION SIGMOID;  Surgeon: Rolm Bookbinder, MD;  Location: [place];  Service: General;  Laterality: N/A;  ? COLOSTOMY TAKEDOWN N/A 09/02/2014  ? Procedure: LAPAROSCOPIC COLOSTOMY REVERSAL;  Surgeon: [name], MD;  Location: [facility];  Service: General;  Laterality: N/A;  ? COSMETIC SURGERY    ? DEBRIDEMENT TENNIS ELBOW    ? DILATION AND CURETTAGE OF UTERUS    ? x3, hysteroscopy  ? ELBOW SURGERY  2007  ? EYE SURGERY    ? Blepharoplasty  ? LAPAROTOMY N/A 04/28/2014  ? Procedure: EXPLORATORY LAPAROTOMY,SIGMOID  COLECTOMY;  Surgeon: Rolm Bookbinder, MD;  Location: [place];  Service: General;  Laterality: N/A;  ? ORIF WRIST FRACTURE Left 02/19/2021  ? Procedure: Left wrist open reduction internal fixation and repair as indicated;  Surgeon: Iran Planas, MD;  Location: [place];  Service: Orthopedics;  Laterality: Left;  81mn  ? REDUCTION MAMMAPLASTY Bilateral   ? scar and adhesion repair  06/01/15  ? and liposuction  ? ULNAR TUNNEL RELEASE Left 2010  ? ? ?Current Medications: ?Current Meds  ?Medication Sig  ? amantadine (SYMMETREL) 100 MG capsule Take 1 capsule (100 mg total) by mouth at bedtime.  ? Ascorbic Acid (VITAMIN C) 1000 MG tablet Take 1,000 mg by mouth daily.  ? cephALEXin (KEFLEX) 500 MG capsule Take 1 capsule (500 mg total) by mouth 2 (two) times daily.  ? COVID-19 mRNA bivalent vaccine, Pfizer, (PFIZER COVID-19 VAC BIVALENT) injection Inject into the muscle.  ? docusate sodium (COLACE) 100 MG capsule Take 1 capsule (100 mg total) by mouth 2 (two) times daily.  ? esomeprazole (NEXIUM) 40 MG capsule TAKE 1 CAPSULE BY MOUTH ONCE DAILY BEFORE BREAKFAST  ? eszopiclone (LUNESTA) 2 MG TABS tablet Take 1 tablet (2 mg total) by mouth at bedtime  ? Eszopiclone 3 MG TABS Take 1 tablet (3 mg total) by mouth at bedtime.  ? famotidine (PEPCID) 20 MG tablet Take 1 tablet (20 mg total) by mouth at bedtime.  ? Ferrous Sulfate (SLOW FE PO) Take 1 tablet by mouth daily.  ? gabapentin (NEURONTIN) 600 MG tablet Take 1 tablet (600 mg) by mouth once every evening.  ? ibuprofen (ADVIL,MOTRIN) 200 MG tablet Take 800 mg by mouth every 6 (six) hours as needed for mild pain or moderate pain.  ? influenza vaccine adjuvanted (FLUAD) 0.5 ML injection Inject into the muscle.  ? lamoTRIgine (LAMICTAL) 150 MG tablet Take 1 tablet by mouth once a day  ? prazosin (MINIPRESS) 2 MG capsule Take 1 capsule (2 mg total) by mouth at bedtime.  ? rosuvastatin (CRESTOR) 5 MG tablet TAKE 1 TABLET by mouth 3 DAYS A WEEK OR AS DIRECTED  ? Tdap (BOOSTRIX)  5-2.5-18.5 LF-MCG/0.5 injection Inject into the muscle.  ? trimethoprim-polymyxin b (POLYTRIM) ophthalmic solution Place 1 drop into affected eye every 6 hours while awake for 5 days  ? Vitamin D, Ergocalciferol, (DRISDOL) 1.25 MG (50000 UNIT) CAPS capsule Take 1 capsule by mouth every 7 days.  ? [DISCONTINUED] eszopiclone (LUNESTA) 2 MG TABS tablet Take 1 tablet (2 mg) by mouth once at bedtime.  ? [DISCONTINUED] gabapentin (NEURONTIN) 300 MG capsule Take 1 capsule (300 mg total) by mouth at bedtime.  ? [DISCONTINUED] lamoTRIgine (LAMICTAL) 150 MG tablet Take 1 tablet (150 mg total) by mouth daily.  ? [DISCONTINUED] lamoTRIgine (LAMICTAL) 150 MG tablet Take 1 tablet (150 mg) by mouth once daily.  ? [DISCONTINUED] prazosin (MINIPRESS) 2 MG capsule Take 1 capsule (2 mg  total) by mouth daily at bedtime.  ? [DISCONTINUED] prazosin (MINIPRESS) 2 MG capsule Take 1 capsule (2 mg) by mouth once every evening.  ? [DISCONTINUED] prednisoLONE acetate (PRED FORTE) 1 % ophthalmic suspension Instill 1 drop into right eye 4 times a day for 1 week. Then instill 1 drop into the right eye 2 times a day for 1 week.  ?  ? ?Allergies:   Bee venom and Statins  ? ?Social History  ? ?Socioeconomic History  ? Marital status: Divorced  ?  Spouse name: Not on file  ? Number of children: Not on file  ? Years of education: Not on file  ? Highest education level: Not on file  ?Occupational History  ? Not on file  ?Tobacco Use  ? Smoking status: Former  ?  Packs/day: 0.50  ?  Types: Cigarettes  ?  Quit date: 09/01/2003  ?  Years since quitting: 18.1  ? Smokeless tobacco: Never  ? Tobacco comments:  ?  occasional  ?Vaping Use  ? Vaping Use: Every day  ? Substances: Nicotine, Nicotine-salt  ?Substance and Sexual Activity  ? Alcohol use: Yes  ?  Alcohol/week: 14.0 standard drinks  ?  Types: 14 Cans of beer per week  ? Drug use: No  ? Sexual activity: Not Currently  ?  Birth control/protection: Surgical  ?  Comment: TAH/BSO  ?Other Topics Concern  ?  Not on file  ?Social History Narrative  ? College Degree  ? Registered Nurse  ? Exercises  ?   ? ?Social Determinants of Health  ? ?Financial Resource Strain: Not on file  ?Food Insecurity: Not on file  ?Transporta

## 2021-11-01 LAB — COMPREHENSIVE METABOLIC PANEL
ALT: 51 IU/L — ABNORMAL HIGH (ref 0–32)
AST: 57 IU/L — ABNORMAL HIGH (ref 0–40)
Albumin/Globulin Ratio: 2.9 — ABNORMAL HIGH (ref 1.2–2.2)
Albumin: 4.9 g/dL — ABNORMAL HIGH (ref 3.8–4.8)
Alkaline Phosphatase: 68 IU/L (ref 44–121)
BUN/Creatinine Ratio: 5 — ABNORMAL LOW (ref 12–28)
BUN: 5 mg/dL — ABNORMAL LOW (ref 8–27)
Bilirubin Total: 0.4 mg/dL (ref 0.0–1.2)
CO2: 20 mmol/L (ref 20–29)
Calcium: 9.3 mg/dL (ref 8.7–10.3)
Chloride: 98 mmol/L (ref 96–106)
Creatinine, Ser: 0.98 mg/dL (ref 0.57–1.00)
Globulin, Total: 1.7 g/dL (ref 1.5–4.5)
Glucose: 108 mg/dL — ABNORMAL HIGH (ref 70–99)
Potassium: 4.1 mmol/L (ref 3.5–5.2)
Sodium: 138 mmol/L (ref 134–144)
Total Protein: 6.6 g/dL (ref 6.0–8.5)
eGFR: 62 mL/min/{1.73_m2} (ref >59)

## 2021-11-01 LAB — LIPID PANEL
Chol/HDL Ratio: 1.7 ratio (ref 0.0–4.4)
Cholesterol, Total: 235 mg/dL — ABNORMAL HIGH (ref 100–199)
HDL: 136 mg/dL (ref >39)
LDL Chol Calc (NIH): 86 mg/dL (ref 0–99)
Triglycerides: 78 mg/dL (ref 0–149)
VLDL Cholesterol Cal: 13 mg/dL (ref 5–40)

## 2021-11-02 ENCOUNTER — Encounter (HOSPITAL_BASED_OUTPATIENT_CLINIC_OR_DEPARTMENT_OTHER): Payer: Self-pay | Admitting: General Practice

## 2021-11-02 ENCOUNTER — Ambulatory Visit (INDEPENDENT_AMBULATORY_CARE_PROVIDER_SITE_OTHER): Payer: Medicare Other | Admitting: General Practice

## 2021-11-02 VITALS — BP 126/80 | HR 96 | Ht 66.25 in | Wt 166.0 lb

## 2021-11-02 DIAGNOSIS — E785 Hyperlipidemia, unspecified: Secondary | ICD-10-CM | POA: Diagnosis not present

## 2021-11-02 DIAGNOSIS — R0609 Other forms of dyspnea: Secondary | ICD-10-CM | POA: Diagnosis not present

## 2021-11-02 DIAGNOSIS — R748 Abnormal levels of other serum enzymes: Secondary | ICD-10-CM

## 2021-11-02 MED ORDER — ROSUVASTATIN CALCIUM 5 MG PO TABS: ORAL_TABLET | ORAL | 3 refills | Status: DC

## 2021-11-02 NOTE — Patient Instructions (Signed)
Medication Instructions:  ?Your Physician recommend you continue on your current medication as directed.   ? ?*If you need a refill on your cardiac medications before your next appointment, please call your pharmacy* ? ? ?Lab Work: ?Please return for Lab work in 2 months for Fasting Liver Function Tests. You may come to the...  ? ?[facility] (3rd floor) ?[address], [place], [place]  ?Open: 8am-Noon and 1pm-4:30pm  ? ?[facility] ?[place]  ? ?[employer]- Any location ? ?**no appointments needed** ? ?If you have labs (blood work) drawn today and your tests are completely normal, you will receive your results only by: ?MyChart Message (if you have MyChart) OR ?A paper copy in the mail ?If you have any lab test that is abnormal or we need to change your treatment, we will call you to review the results. ? ?Follow-Up: ?At [facility], you and your health needs are our priority.  As part of our continuing mission to provide you with exceptional heart care, we have created designated Provider Care Teams.  These Care Teams include your primary Cardiologist (physician) and Advanced Practice Providers (APPs -  Physician Assistants and Nurse Practitioners) who all work together to provide you with the care you need, when you need it. ? ?We recommend signing up for the patient portal called "MyChart".  Sign up information is provided on this After Visit Summary.  MyChart is used to connect with patients for Virtual Visits (Telemedicine).  Patients are able to view lab/test results, encounter notes, upcoming appointments, etc.  Non-urgent messages can be sent to your provider as well.   ?To learn more about what you can do with MyChart, go to [email or web address].   ? ?Your next appointment:   ?1 year(s) ? ?The format for your next appointment:   ?In Person ? ?Provider:   ?Skeet Latch, MD{ ? ?Other Instructions ?Please cut your alcohol  consumption in half to help with elevated liver enzymes.  ? ?Please work to increase your oral intake of water.  ? ?Managing Stress, Adult ?Feeling a certain amount of stress is normal. Stress helps our body and mind get ready to deal with the demands of life. Stress hormones can motivate you to do well at work and meet your responsibilities. But severe or long-term (chronic) stress can affect your mental and physical health. Chronic stress puts you at higher risk for: ?Anxiety and depression. ?Other health problems such as digestive problems, muscle aches, heart disease, high blood pressure, and stroke. ?What are the causes? ?Common causes of stress include: ?Demands from work, such as deadlines, feeling overworked, or having long hours. ?Pressures at home, such as money issues, disagreements with a spouse, or parenting issues. ?Pressures from major life changes, such as divorce, moving, loss of a loved one, or chronic illness. ?You may be at higher risk for stress-related problems if you: ?Do not get enough sleep. ?Are in poor health. ?Do not have emotional support. ?Have a mental health disorder such as anxiety or depression. ?How to recognize stress ?Stress can make you: ?Have trouble sleeping. ?Feel sad, anxious, irritable, or overwhelmed. ?Lose your appetite. ?Overeat or want to eat unhealthy foods. ?Want to use drugs or alcohol. ?Stress can also cause physical symptoms, such as: ?Sore, tense muscles, especially in the shoulders and neck. ?Headaches. ?Trouble breathing. ?A faster heart rate. ?Stomach pain, nausea, or vomiting. ?Diarrhea or constipation. ?Trouble concentrating. ?Follow these instructions at home: ?Eating and drinking ?Eat  a healthy diet. This includes: ?Eating foods that are high in fiber, such as beans, whole grains, and fresh fruits and vegetables. ?Limiting foods that are high in fat and processed sugars, such as fried or sweet foods. ?Do not skip meals or overeat. ?Drink enough fluid to  keep your urine pale yellow. ?Alcohol use ?Do not drink alcohol if: ?Your health care provider tells you not to drink. ?You are pregnant, may be pregnant, or are planning to become pregnant. ?Drinking alcohol is a way some people try to ease their stress. This can be dangerous, so if you drink alcohol: ?Limit how much you have to: ?0-1 drink a day for women. ?0-2 drinks a day for men. ?Know how much alcohol is in your drink. In the [place] one drink equals one 12 oz bottle of beer (355 mL), one 5 oz glass of wine (148 mL), or one 1? oz glass of hard liquor (44 mL). ?Activity ? ?Include 30 minutes of exercise in your daily schedule. Exercise is a good stress reducer. ?Include time in your day for an activity that you find relaxing. Try taking a walk, going on a bike ride, reading a book, or listening to music. ?Schedule your time in a way that lowers stress, and keep a regular schedule. Focus on doing what is most important to get done. ?Lifestyle ?Identify the source of your stress and your reaction to it. See a therapist who can help you change unhelpful reactions. ?When there are stressful events: ?Talk about them with family, friends, or coworkers. ?Try to think realistically about stressful events and not ignore them or overreact. ?Try to find the positives in a stressful situation and not focus on the negatives. ?Cut back on responsibilities at work and home, if possible. Ask for help from friends or family members if you need it. ?Find ways to manage stress, such as: ?Mindfulness, meditation, or deep breathing. ?Yoga or tai chi. ?Progressive muscle relaxation. ?Spending time in nature. ?Doing art, playing music, or reading. ?Making time for fun activities. ?Spending time with family and friends. ?Get support from family, friends, or spiritual resources. ?General instructions ?Get enough sleep. Try to go to sleep and get up at about the same time every day. ?Take over-the-counter and prescription medicines only  as told by your health care provider. ?Do not use any products that contain nicotine or tobacco. These products include cigarettes, chewing tobacco, and vaping devices, such as e-cigarettes. If you need help quitting, ask your health care provider. ?Do not use drugs or smoke to deal with stress. ?Keep all follow-up visits. This is important. ?Where to find support ?Talk with your health care provider about stress management or finding a support group. ?Find a therapist to work with you on your stress management techniques. ?Where to find more information ?National Alliance on Mental Illness: www.nami.org ?American Psychological Association: TVStereos.ch ?Contact a health care provider if: ?Your stress symptoms get worse. ?You are unable to manage your stress at home. ?You are struggling to stop using drugs or alcohol. ?Get help right away if: ?You may be a danger to yourself or others. ?You have any thoughts of death or suicide. ?Get help right awayif you feel like you may hurt yourself or others, or have thoughts about taking your own life. Go to your nearest emergency room or: ?Call 911. ?Call the [place] at (251) 729-3109 or 988 in the [place] This is open 24 hours a day. ?Text the Crisis Text Line at 502-768-2235. ?Summary ?  Feeling a certain amount of stress is normal, but severe or long-term (chronic) stress can affect your mental and physical health. ?Chronic stress can put you at higher risk for anxiety, depression, and other health problems such as digestive problems, muscle aches, heart disease, high blood pressure, and stroke. ?You may be at higher risk for stress-related problems if you do not get enough sleep, are in poor health, lack emotional support, or have a mental health disorder such as anxiety or depression. ?Identify the source of your stress and your reaction to it. Try talking about stressful events with family, friends, or coworkers, finding a coping method, or getting  support from spiritual resources. ?If you need more help, talk with your health care provider about finding a support group or a mental health therapist. ?This information is not intended to replace advice given

## 2021-11-03 DIAGNOSIS — M1711 Unilateral primary osteoarthritis, right knee: Secondary | ICD-10-CM | POA: Diagnosis not present

## 2021-11-03 DIAGNOSIS — M170 Bilateral primary osteoarthritis of knee: Secondary | ICD-10-CM | POA: Diagnosis not present

## 2021-11-04 ENCOUNTER — Other Ambulatory Visit: Payer: Self-pay | Admitting: Obstetrics & Gynecology

## 2021-11-04 DIAGNOSIS — Z1231 Encounter for screening mammogram for malignant neoplasm of breast: Secondary | ICD-10-CM

## 2021-11-10 ENCOUNTER — Ambulatory Visit
Admission: RE | Admit: 2021-11-10 | Discharge: 2021-11-10 | Disposition: A | Discharge status: Home / Self Care | Payer: Medicare Other | Source: Ambulatory Visit | Attending: Obstetrics & Gynecology | Admitting: Obstetrics & Gynecology

## 2021-11-10 DIAGNOSIS — Z1231 Encounter for screening mammogram for malignant neoplasm of breast: Secondary | ICD-10-CM

## 2021-11-11 ENCOUNTER — Other Ambulatory Visit (HOSPITAL_COMMUNITY): Payer: Self-pay | Admitting: Specialist

## 2021-11-11 DIAGNOSIS — M79604 Pain in right leg: Secondary | ICD-10-CM

## 2021-11-11 DIAGNOSIS — M79661 Pain in right lower leg: Secondary | ICD-10-CM

## 2021-11-11 DIAGNOSIS — M79605 Pain in left leg: Secondary | ICD-10-CM

## 2021-11-14 ENCOUNTER — Ambulatory Visit (HOSPITAL_COMMUNITY)
Admission: RE | Admit: 2021-11-14 | Discharge: 2021-11-14 | Disposition: A | Discharge status: Home / Self Care | Payer: Medicare Other | Source: Ambulatory Visit | Attending: Cardiology | Admitting: Cardiology

## 2021-11-14 DIAGNOSIS — M79604 Pain in right leg: Secondary | ICD-10-CM | POA: Diagnosis not present

## 2021-11-14 DIAGNOSIS — M79605 Pain in left leg: Secondary | ICD-10-CM

## 2021-11-14 DIAGNOSIS — M79662 Pain in left lower leg: Secondary | ICD-10-CM

## 2021-11-14 DIAGNOSIS — M79661 Pain in right lower leg: Secondary | ICD-10-CM

## 2021-11-16 DIAGNOSIS — M7121 Synovial cyst of popliteal space [Baker], right knee: Secondary | ICD-10-CM | POA: Diagnosis not present

## 2021-11-17 ENCOUNTER — Other Ambulatory Visit (HOSPITAL_BASED_OUTPATIENT_CLINIC_OR_DEPARTMENT_OTHER): Payer: Self-pay

## 2021-11-18 ENCOUNTER — Other Ambulatory Visit (HOSPITAL_BASED_OUTPATIENT_CLINIC_OR_DEPARTMENT_OTHER): Payer: Self-pay

## 2021-11-24 ENCOUNTER — Other Ambulatory Visit (HOSPITAL_BASED_OUTPATIENT_CLINIC_OR_DEPARTMENT_OTHER): Payer: Self-pay

## 2021-12-09 ENCOUNTER — Ambulatory Visit (HOSPITAL_BASED_OUTPATIENT_CLINIC_OR_DEPARTMENT_OTHER): Payer: 59 | Admitting: Obstetrics & Gynecology

## 2021-12-14 DIAGNOSIS — M170 Bilateral primary osteoarthritis of knee: Secondary | ICD-10-CM | POA: Diagnosis not present

## 2021-12-22 ENCOUNTER — Other Ambulatory Visit: Payer: Self-pay | Admitting: Gastroenterology

## 2021-12-22 ENCOUNTER — Other Ambulatory Visit (HOSPITAL_BASED_OUTPATIENT_CLINIC_OR_DEPARTMENT_OTHER): Payer: Self-pay

## 2021-12-22 MED ORDER — FAMOTIDINE 20 MG PO TABS
20.0000 mg | ORAL_TABLET | Freq: Every day | ORAL | 1 refills | Status: DC
Start: 2021-12-22 — End: 2022-02-21
  Filled 2021-12-22: qty 30, 30d supply, fill #0
  Filled 2022-01-17: qty 30, 30d supply, fill #1

## 2021-12-28 DIAGNOSIS — M1711 Unilateral primary osteoarthritis, right knee: Secondary | ICD-10-CM | POA: Diagnosis not present

## 2022-01-04 DIAGNOSIS — M1711 Unilateral primary osteoarthritis, right knee: Secondary | ICD-10-CM | POA: Diagnosis not present

## 2022-01-11 DIAGNOSIS — M1711 Unilateral primary osteoarthritis, right knee: Secondary | ICD-10-CM | POA: Diagnosis not present

## 2022-01-17 ENCOUNTER — Other Ambulatory Visit (HOSPITAL_BASED_OUTPATIENT_CLINIC_OR_DEPARTMENT_OTHER): Payer: Self-pay

## 2022-01-19 ENCOUNTER — Other Ambulatory Visit (HOSPITAL_BASED_OUTPATIENT_CLINIC_OR_DEPARTMENT_OTHER): Payer: Self-pay

## 2022-01-19 MED ORDER — LAMOTRIGINE 150 MG PO TABS
ORAL_TABLET | ORAL | 2 refills | Status: DC
  Filled 2022-01-19: qty 30, 30d supply, fill #0
  Filled 2022-02-21: qty 30, 30d supply, fill #1
  Filled 2022-03-21: qty 30, 30d supply, fill #2

## 2022-01-26 DIAGNOSIS — Z79899 Other long term (current) drug therapy: Secondary | ICD-10-CM | POA: Diagnosis not present

## 2022-01-31 ENCOUNTER — Other Ambulatory Visit (HOSPITAL_BASED_OUTPATIENT_CLINIC_OR_DEPARTMENT_OTHER): Payer: Self-pay

## 2022-02-13 DIAGNOSIS — H5203 Hypermetropia, bilateral: Secondary | ICD-10-CM | POA: Diagnosis not present

## 2022-02-13 DIAGNOSIS — H2513 Age-related nuclear cataract, bilateral: Secondary | ICD-10-CM | POA: Diagnosis not present

## 2022-02-21 ENCOUNTER — Other Ambulatory Visit: Payer: Self-pay | Admitting: Gastroenterology

## 2022-02-21 ENCOUNTER — Other Ambulatory Visit (HOSPITAL_BASED_OUTPATIENT_CLINIC_OR_DEPARTMENT_OTHER): Payer: Self-pay

## 2022-02-21 MED ORDER — FAMOTIDINE 20 MG PO TABS
20.0000 mg | ORAL_TABLET | Freq: Every day | ORAL | 0 refills | Status: DC
  Filled 2022-02-21: qty 30, 30d supply, fill #0

## 2022-02-22 ENCOUNTER — Other Ambulatory Visit (HOSPITAL_BASED_OUTPATIENT_CLINIC_OR_DEPARTMENT_OTHER): Payer: Self-pay

## 2022-02-22 MED ORDER — ESZOPICLONE 2 MG PO TABS
ORAL_TABLET | ORAL | 2 refills | Status: DC
  Filled 2022-02-22: qty 30, 30d supply, fill #0
  Filled 2022-03-28: qty 30, 30d supply, fill #1
  Filled 2022-04-12 – 2022-05-03 (×2): qty 30, 30d supply, fill #2

## 2022-02-24 ENCOUNTER — Other Ambulatory Visit (HOSPITAL_BASED_OUTPATIENT_CLINIC_OR_DEPARTMENT_OTHER): Payer: Self-pay

## 2022-03-01 ENCOUNTER — Other Ambulatory Visit (HOSPITAL_BASED_OUTPATIENT_CLINIC_OR_DEPARTMENT_OTHER): Payer: Self-pay

## 2022-03-01 DIAGNOSIS — M1711 Unilateral primary osteoarthritis, right knee: Secondary | ICD-10-CM | POA: Diagnosis not present

## 2022-03-01 MED ORDER — METHOCARBAMOL 500 MG PO TABS
ORAL_TABLET | ORAL | 0 refills | Status: DC
  Filled 2022-03-01: qty 40, 7d supply, fill #0

## 2022-03-02 ENCOUNTER — Ambulatory Visit (INDEPENDENT_AMBULATORY_CARE_PROVIDER_SITE_OTHER): Payer: Medicare Other | Admitting: Obstetrics & Gynecology

## 2022-03-02 ENCOUNTER — Other Ambulatory Visit (HOSPITAL_BASED_OUTPATIENT_CLINIC_OR_DEPARTMENT_OTHER): Payer: Self-pay

## 2022-03-02 ENCOUNTER — Encounter (HOSPITAL_BASED_OUTPATIENT_CLINIC_OR_DEPARTMENT_OTHER): Payer: Self-pay | Admitting: Obstetrics & Gynecology

## 2022-03-02 VITALS — BP 154/86 | HR 86 | Ht 66.25 in | Wt 158.8 lb

## 2022-03-02 DIAGNOSIS — Z9189 Other specified personal risk factors, not elsewhere classified: Secondary | ICD-10-CM | POA: Diagnosis not present

## 2022-03-02 DIAGNOSIS — Z780 Asymptomatic menopausal state: Secondary | ICD-10-CM

## 2022-03-02 DIAGNOSIS — R748 Abnormal levels of other serum enzymes: Secondary | ICD-10-CM

## 2022-03-02 DIAGNOSIS — Z90710 Acquired absence of both cervix and uterus: Secondary | ICD-10-CM | POA: Diagnosis not present

## 2022-03-02 DIAGNOSIS — Z9079 Acquired absence of other genital organ(s): Secondary | ICD-10-CM

## 2022-03-02 DIAGNOSIS — Z90722 Acquired absence of ovaries, bilateral: Secondary | ICD-10-CM

## 2022-03-02 DIAGNOSIS — B009 Herpesviral infection, unspecified: Secondary | ICD-10-CM | POA: Diagnosis not present

## 2022-03-02 DIAGNOSIS — E559 Vitamin D deficiency, unspecified: Secondary | ICD-10-CM

## 2022-03-02 DIAGNOSIS — R7303 Prediabetes: Secondary | ICD-10-CM | POA: Diagnosis not present

## 2022-03-02 DIAGNOSIS — F5104 Psychophysiologic insomnia: Secondary | ICD-10-CM

## 2022-03-02 MED ORDER — VITAMIN D (ERGOCALCIFEROL) 1.25 MG (50000 UNIT) PO CAPS
50000.0000 [IU] | ORAL_CAPSULE | ORAL | 3 refills | Status: DC
  Filled 2022-03-02 – 2022-04-12 (×2): qty 12, 84d supply, fill #0
  Filled 2022-08-04: qty 12, 84d supply, fill #1
  Filled 2022-10-27 – 2022-10-28 (×3): qty 12, 84d supply, fill #2

## 2022-03-02 NOTE — Patient Instructions (Addendum)
Melrose Nakayama, MD [place], [place] [address]  Phone: [phone]

## 2022-03-02 NOTE — Progress Notes (Signed)
71 y.o. G79P2 Divorced White or Caucasian female here for breast and pelvic exam. Denies vaginal bleeding.  Had significant left wrist injury when trying to rescue a dog in the rain.  Had ORIF 02/19/2021.  Had physical therapy for months.  Was terminated from her job due to being out of work so long.  She was called to return to work two months later and she declined.  Had to move her Dad down here from [place].  He passed this year so that has been a sadness.  One of her cats died this year as well.    Has a second grandchild (daughter lives in [place]).    Had elevated liver  Patient's last menstrual period was 08/07/1998.          Sexually active: No.  H/O STD:  h/o HSV 1  Health Maintenance: PCP:  [name].  Last wellness appt was 10/31/2021.  Did blood work at that appt:  yes Vaccines are up to date:  yes Colonoscopy:  08/18/2014, hyperplastic polyp MMG:  11/10/2021 Negative BMD:  08/27/2017 Osteopenia, -1/1 Last pap smear:  not indicated.   H/o abnormal pap smear:  no    reports that she quit smoking about 18 years ago. Her smoking use included cigarettes. She smoked an average of .5 packs per day. She has never used smokeless tobacco. She reports current alcohol use of about 14.0 standard drinks of alcohol per week. She reports that she does not use drugs.  Past Medical History:  Diagnosis Date   Anxiety    Complication of anesthesia    past hx. laryngospasm following 2 past surgeries, not recent   Depression    Exertional dyspnea 08/04/2019   GERD (gastroesophageal reflux disease)    Hiatal hernia 10/17/2019   Hyperlipidemia    Insomnia    periodically uses med.   Perforated sigmoid colon ([place]) 04/28/2014   during colonoscopy, "rupture colon"became septic, has colostomy and open surgical wound   Prediabetes 08/09/2017   Suicide attempt ([place])    Vitamin D deficiency     Past Surgical History:  Procedure Laterality Date   ABDOMINAL HYSTERECTOMY  1999   TAH/BSO    APPLICATION OF WOUND VAC  04/28/2014   Procedure: APPLICATION OF WOUND VAC;  Surgeon: Rolm Bookbinder, MD;  Location: [facility];  Service: General;;   BLEPHAROPLASTY  2010   BREAST SURGERY  2001   Breast reduction   COLOSTOMY N/A 04/28/2014   Procedure: COLOSTOMY;  Surgeon: Rolm Bookbinder, MD;  Location: [facility];  Service: General;  Laterality: N/A;   COLOSTOMY REVISION N/A 04/28/2014   Procedure: COLON RESECTION SIGMOID;  Surgeon: Rolm Bookbinder, MD;  Location: [facility];  Service: General;  Laterality: N/A;   COLOSTOMY TAKEDOWN N/A 09/02/2014   Procedure: LAPAROSCOPIC COLOSTOMY REVERSAL;  Surgeon: [name], MD;  Location: [facility];  Service: General;  Laterality: N/A;   COSMETIC SURGERY     DEBRIDEMENT TENNIS ELBOW     DILATION AND CURETTAGE OF UTERUS     x3, hysteroscopy   ELBOW SURGERY  2007   EYE SURGERY     Blepharoplasty   LAPAROTOMY N/A 04/28/2014   Procedure: EXPLORATORY LAPAROTOMY,SIGMOID COLECTOMY;  Surgeon: Rolm Bookbinder, MD;  Location: [facility];  Service: General;  Laterality: N/A;   ORIF WRIST FRACTURE Left 02/19/2021   Procedure: Left wrist open reduction internal fixation and repair as indicated;  Surgeon: Iran Planas, MD;  Location: [facility];  Service: Orthopedics;  Laterality: Left;  24mn   REDUCTION  MAMMAPLASTY Bilateral    scar and adhesion repair  06/01/15   and liposuction   ULNAR TUNNEL RELEASE Left 2010    Current Outpatient Medications  Medication Sig Dispense Refill   amantadine (SYMMETREL) 100 MG capsule Take 1 capsule (100 mg total) by mouth at bedtime. 30 capsule 3   Ascorbic Acid (VITAMIN C) 1000 MG tablet Take 1,000 mg by mouth daily.     COVID-19 mRNA bivalent vaccine, Pfizer, (PFIZER COVID-19 VAC BIVALENT) injection Inject into the muscle. 0.3 mL 0   docusate sodium (COLACE) 100 MG capsule Take 1 capsule (100 mg total) by mouth 2 (two) times daily. 10 capsule 0   esomeprazole (NEXIUM) 40 MG capsule TAKE 1 CAPSULE BY MOUTH ONCE DAILY BEFORE BREAKFAST 30  capsule 1   eszopiclone (LUNESTA) 2 MG TABS tablet Take 1 tablet (2 mg total) by mouth at bedtime 30 tablet 3   eszopiclone (LUNESTA) 2 MG TABS tablet Take 1 tablet (2 mg) by mouth once at bedtime. 30 tablet 3   eszopiclone (LUNESTA) 2 MG TABS tablet Take 1 tablet by mouth daily at bedtime. 30 tablet 2   Eszopiclone 3 MG TABS Take 1 tablet (3 mg total) by mouth at bedtime. 30 tablet 2   famotidine (PEPCID) 20 MG tablet Take 1 tablet (20 mg total) by mouth at bedtime. 30 tablet 0   Ferrous Sulfate (SLOW FE PO) Take 1 tablet by mouth daily.     gabapentin (NEURONTIN) 600 MG tablet Take 1 tablet (600 mg) by mouth once every evening. 90 tablet 1   ibuprofen (ADVIL,MOTRIN) 200 MG tablet Take 800 mg by mouth every 6 (six) hours as needed for mild pain or moderate pain.     influenza vaccine adjuvanted (FLUAD) 0.5 ML injection Inject into the muscle. 0.5 mL 0   lamoTRIgine (LAMICTAL) 150 MG tablet Take 1 tablet by mouth once a day 30 tablet 2   methocarbamol (ROBAXIN) 500 MG tablet Take 1 tablet by mouth every 4-6 hours as needed. 40 tablet 0   prazosin (MINIPRESS) 2 MG capsule Take 1 capsule (2 mg total) by mouth at bedtime. 30 capsule 3   rosuvastatin (CRESTOR) 5 MG tablet TAKE 1 TABLET by mouth 3 DAYS A WEEK OR AS DIRECTED 90 tablet 3   Tdap (BOOSTRIX) 5-2.5-18.5 LF-MCG/0.5 injection Inject into the muscle. 0.5 mL 0   Vitamin D, Ergocalciferol, (DRISDOL) 1.25 MG (50000 UNIT) CAPS capsule Take 1 capsule by mouth every 7 days. 12 capsule 3   No current facility-administered medications for this visit.    Family History  Problem Relation Age of Onset   Hyperlipidemia Father    Hypertension Father    Diabetes Father    Cancer Father        History of bladder cancer   Heart disease Father    Other Father        4 ft intestinal removed-had gangrene in gallbladder, then spread to intestines   Heart attack Father    Hypertension Mother    Diabetes Mother    Heart disease Maternal Grandmother     Stroke Maternal Grandfather    Hyperlipidemia Brother    Cancer Paternal Grandmother        uterine   Cancer Paternal Grandfather        pancreatic cancer   Esophageal cancer Sister     Review of Systems  Constitutional: Negative.   Genitourinary: Negative.     Exam:   BP (!) 154/86 (BP Location: Right Arm, Patient  Position: Sitting, Cuff Size: Large)   Pulse 86   Ht 5' 6.25" (1.683 m) Comment: reported  Wt 158 lb 12.8 oz (72 kg)   LMP 08/07/1998   BMI 25.44 kg/m   Height: 5' 6.25" (168.3 cm) (reported)  General appearance: alert, cooperative and appears stated age Breasts: normal appearance, no masses or tenderness Abdomen: soft, non-tender; bowel sounds normal; no masses,  no organomegaly Lymph nodes: Cervical, supraclavicular, and axillary nodes normal.  No abnormal inguinal nodes palpated Neurologic: Grossly normal  Pelvic: External genitalia:  no lesions              Urethra:  normal appearing urethra with no masses, tenderness or lesions              Bartholins and Skenes: normal                 Vagina: normal appearing vagina with atrophic changes and no discharge, no lesions              Cervix: absent              Pap taken: No. Bimanual Exam:  Uterus:  uterus absent              Adnexa: no mass, fullness, tenderness               Rectovaginal: Confirms               Anus:  normal sphincter tone, no lesions  Chaperone, Octaviano Batty, CMA, was present for exam.  Assessment/Plan: 1. GYN exam for high-risk Medicare patient - pap not indicated - MMG up to date - colonoscopy 2016 - BMD 2019.  Recommend repeating next year. - vaccines reviewed.  2. Vitamin D deficiency - Vitamin D, Ergocalciferol, (DRISDOL) 1.25 MG (50000 UNIT) CAPS capsule; Take 1 capsule by mouth every 7 days.  Dispense: 12 capsule; Refill: 3  3. 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 Chronic insomnia - on lunesta and rx written by mental health provider  6.  Postmenopausal - no HRT  7. Elevated liver enzymes - will repeat today - Hepatic function panel  8. Prediabetes - Hemoglobin A1c

## 2022-03-03 LAB — HEPATIC FUNCTION PANEL
ALT: 12 IU/L (ref 0–32)
AST: 18 IU/L (ref 0–40)
Albumin: 5.2 g/dL — ABNORMAL HIGH (ref 3.9–4.9)
Alkaline Phosphatase: 64 IU/L (ref 44–121)
Bilirubin Total: 0.2 mg/dL (ref 0.0–1.2)
Bilirubin, Direct: 0.15 mg/dL (ref 0.00–0.40)
Total Protein: 7.3 g/dL (ref 6.0–8.5)

## 2022-03-03 LAB — HEMOGLOBIN A1C
Est. average glucose Bld gHb Est-mCnc: 111 mg/dL
Hgb A1c MFr Bld: 5.5 % (ref 4.8–5.6)

## 2022-03-07 ENCOUNTER — Telehealth (HOSPITAL_BASED_OUTPATIENT_CLINIC_OR_DEPARTMENT_OTHER): Payer: Self-pay

## 2022-03-07 NOTE — Telephone Encounter (Signed)
Preoperative team, please contact this patient and set up a phone call appointment for further cardiac evaluation.  Thank you for your help.  Jossie Ng. Marlee Armenteros NP-C    03/07/2022, 4:07 PM [facility] [place] [address] Office [phone] Fax [phone]

## 2022-03-07 NOTE — Telephone Encounter (Signed)
   Pre-operative Risk Assessment    Patient Name: [name]  DOB: 04-Dec-1950 MRN: [id number]      Request for Surgical Clearance    Procedure:   Right Total Knee Arthroplasty  Date of Surgery:  Clearance TBD                                 Surgeon:  Dr. Jonn Shingles Surgeon's Group or Practice Name:  [facility] Phone number:  [phone] Marianna Fuss Maze-Scheduler) Fax number:  [phone]   Type of Clearance Requested:   - Medical    Type of Anesthesia:  Spinal   Additional requests/questions:   None  [name]   03/07/2022, 3:56 PM

## 2022-03-08 NOTE — Telephone Encounter (Signed)
Contacted pt regarding surgical clearance and the need for a tele visit.  Per pt, she is going for a 2nd opinion 03/14/22 and isn't sure which surgeon she is going with.  Per pt, she will call us back after that appointment to let us know how to pursue with the clearance.

## 2022-03-21 ENCOUNTER — Other Ambulatory Visit: Payer: Self-pay | Admitting: Gastroenterology

## 2022-03-21 ENCOUNTER — Other Ambulatory Visit (HOSPITAL_BASED_OUTPATIENT_CLINIC_OR_DEPARTMENT_OTHER): Payer: Self-pay

## 2022-03-21 MED ORDER — FAMOTIDINE 20 MG PO TABS
20.0000 mg | ORAL_TABLET | Freq: Every day | ORAL | 0 refills | Status: DC
  Filled 2022-03-21: qty 15, 15d supply, fill #0

## 2022-03-21 MED ORDER — GABAPENTIN 600 MG PO TABS
600.0000 mg | ORAL_TABLET | Freq: Every evening | ORAL | 3 refills | Status: DC
  Filled 2022-04-12: qty 90, 90d supply, fill #0
  Filled 2022-08-04: qty 90, 90d supply, fill #1
  Filled 2022-09-01: qty 90, 90d supply, fill #2

## 2022-03-27 DIAGNOSIS — M1711 Unilateral primary osteoarthritis, right knee: Secondary | ICD-10-CM | POA: Diagnosis not present

## 2022-03-28 ENCOUNTER — Other Ambulatory Visit (HOSPITAL_BASED_OUTPATIENT_CLINIC_OR_DEPARTMENT_OTHER): Payer: Self-pay

## 2022-03-29 ENCOUNTER — Other Ambulatory Visit (HOSPITAL_BASED_OUTPATIENT_CLINIC_OR_DEPARTMENT_OTHER): Payer: Self-pay

## 2022-03-29 MED ORDER — ACETAMINOPHEN-CODEINE 300-30 MG PO TABS
ORAL_TABLET | ORAL | 0 refills | Status: DC
  Filled 2022-03-29: qty 15, 4d supply, fill #0

## 2022-03-29 MED ORDER — AMOXICILLIN 500 MG PO CAPS
ORAL_CAPSULE | ORAL | 0 refills | Status: DC
  Filled 2022-03-29: qty 28, 7d supply, fill #0

## 2022-04-03 ENCOUNTER — Ambulatory Visit (INDEPENDENT_AMBULATORY_CARE_PROVIDER_SITE_OTHER): Payer: Medicare Other | Admitting: Obstetrics & Gynecology

## 2022-04-03 ENCOUNTER — Telehealth (HOSPITAL_BASED_OUTPATIENT_CLINIC_OR_DEPARTMENT_OTHER): Payer: Self-pay | Admitting: *Deleted

## 2022-04-03 ENCOUNTER — Encounter (HOSPITAL_BASED_OUTPATIENT_CLINIC_OR_DEPARTMENT_OTHER): Payer: Self-pay | Admitting: Obstetrics & Gynecology

## 2022-04-03 VITALS — BP 138/88 | HR 121 | Ht 66.25 in | Wt 158.4 lb

## 2022-04-03 DIAGNOSIS — N6489 Other specified disorders of breast: Secondary | ICD-10-CM | POA: Diagnosis not present

## 2022-04-03 NOTE — Telephone Encounter (Signed)
Pt called with concerns of a lump and bruise on her breast that has occurred due to a fall. Pt requests an appt for evaluation. Appt provided

## 2022-04-04 ENCOUNTER — Ambulatory Visit (INDEPENDENT_AMBULATORY_CARE_PROVIDER_SITE_OTHER): Payer: Medicare Other | Admitting: Family Medicine

## 2022-04-04 ENCOUNTER — Encounter: Payer: Self-pay | Admitting: Family Medicine

## 2022-04-04 VITALS — BP 130/70 | HR 96 | Temp 98.6°F | Ht 65.25 in | Wt 159.0 lb

## 2022-04-04 DIAGNOSIS — Z01818 Encounter for other preprocedural examination: Secondary | ICD-10-CM | POA: Diagnosis not present

## 2022-04-04 DIAGNOSIS — N6489 Other specified disorders of breast: Secondary | ICD-10-CM | POA: Insufficient documentation

## 2022-04-04 DIAGNOSIS — S0993XA Unspecified injury of face, initial encounter: Secondary | ICD-10-CM

## 2022-04-04 NOTE — Progress Notes (Signed)
   Subjective:    Patient ID: [name], female    DOB: [birth year], 71 y.o.   MRN: [id number]  HPI She is here for preoperative evaluation.  She has had some recent dental injury and a fall and is seeing the dentist for that.  She is on Amoxil and on Vicodin for her pain.  She is scheduled to have joint replacement.  She has no cardiac or lung history.   Review of Systems     Objective:   Physical Exam Alert and in no distress.  Contusions to the face and broken incisor teeth is noted.  Cardiac exam shows regular rhythm without murmurs gallop.  Lungs are clear to auscultation.       Assessment & Plan:  Pre-op exam  Dental injury, initial encounter She is cleared for surgery.

## 2022-04-04 NOTE — Progress Notes (Signed)
GYNECOLOGY  VISIT  CC:   breast mass/lump  HPI: 71 y.o. G33P2 Divorced White or Caucasian female here for concerns about right breast after having significant fall at her home.  Reports she tripped and fell into a marble table.  Her teeth and face hit this.  Then she fell into a queen anne's type chair with wooden spokes and points.  Part of this fall was into the breast.  She had broken teeth, lacerations inside her mouth, facial bruising, breast bruising especially on the right and left shin bruising.  Has noted a mass in the right breast and wants evaluation for this.  It is quite tender.  Last MMG was 11/2021.  Reviewed with pt.   Past Medical History:  Diagnosis Date   Anxiety    Complication of anesthesia    past hx. laryngospasm following 2 past surgeries, not recent   Depression    Exertional dyspnea 08/04/2019   GERD (gastroesophageal reflux disease)    Hiatal hernia 10/17/2019   Hyperlipidemia    Insomnia    periodically uses med.   Perforated sigmoid colon ([place]) 04/28/2014   during colonoscopy, "rupture colon"became septic, has colostomy and open surgical wound   Prediabetes 08/09/2017   Suicide attempt ([place])    Vitamin D deficiency     MEDS:   Current Outpatient Medications on File Prior to Visit  Medication Sig Dispense Refill   acetaminophen-codeine (TYLENOL #3) 300-30 MG tablet 1 tab po q6hours prn pain (Patient not taking: Reported on 04/04/2022) 15 tablet 0   amantadine (SYMMETREL) 100 MG capsule Take 1 capsule (100 mg total) by mouth at bedtime. (Patient not taking: Reported on 04/04/2022) 30 capsule 3   amoxicillin (AMOXIL) 500 MG capsule 1 tab by mouth 4 times a day until complete 28 capsule 0   Ascorbic Acid (VITAMIN C) 1000 MG tablet Take 1,000 mg by mouth daily.     COVID-19 mRNA bivalent vaccine, Pfizer, (PFIZER COVID-19 VAC BIVALENT) injection Inject into the muscle. (Patient not taking: Reported on 04/04/2022) 0.3 mL 0   docusate sodium (COLACE) 100 MG  capsule Take 1 capsule (100 mg total) by mouth 2 (two) times daily. (Patient not taking: Reported on 04/04/2022) 10 capsule 0   esomeprazole (NEXIUM) 40 MG capsule TAKE 1 CAPSULE BY MOUTH ONCE DAILY BEFORE BREAKFAST 30 capsule 1   eszopiclone (LUNESTA) 2 MG TABS tablet Take 1 tablet (2 mg total) by mouth at bedtime 30 tablet 3   eszopiclone (LUNESTA) 2 MG TABS tablet Take 1 tablet (2 mg) by mouth once at bedtime. 30 tablet 3   eszopiclone (LUNESTA) 2 MG TABS tablet Take 1 tablet by mouth daily at bedtime. 30 tablet 2   Eszopiclone 3 MG TABS Take 1 tablet (3 mg total) by mouth at bedtime. (Patient not taking: Reported on 04/04/2022) 30 tablet 2   famotidine (PEPCID) 20 MG tablet Take 1 tablet (20 mg total) by mouth at bedtime. Please make appointment for further refills, [phone]. 15 tablet 0   Ferrous Sulfate (SLOW FE PO) Take 1 tablet by mouth daily.     gabapentin (NEURONTIN) 600 MG tablet Take 1 tablet (600 mg total) by mouth every evening. 90 tablet 3   ibuprofen (ADVIL,MOTRIN) 200 MG tablet Take 800 mg by mouth every 6 (six) hours as needed for mild pain or moderate pain.     influenza vaccine adjuvanted (FLUAD) 0.5 ML injection Inject into the muscle. (Patient not taking: Reported on 04/04/2022) 0.5 mL 0   lamoTRIgine (LAMICTAL)  150 MG tablet Take 1 tablet by mouth once a day 30 tablet 2   methocarbamol (ROBAXIN) 500 MG tablet Take 1 tablet by mouth every 4-6 hours as needed. (Patient not taking: Reported on 04/04/2022) 40 tablet 0   prazosin (MINIPRESS) 2 MG capsule Take 1 capsule (2 mg total) by mouth at bedtime. 30 capsule 3   Tdap (BOOSTRIX) 5-2.5-18.5 LF-MCG/0.5 injection Inject into the muscle. (Patient not taking: Reported on 04/04/2022) 0.5 mL 0   Vitamin D, Ergocalciferol, (DRISDOL) 1.25 MG (50000 UNIT) CAPS capsule Take 1 capsule by mouth every 7 days. 12 capsule 3   No current facility-administered medications on file prior to visit.    ALLERGIES: Bee venom and Statins  SH:   divorced, non smoker  Review of Systems  Constitutional: Negative.     PHYSICAL EXAMINATION:    BP 138/88 (BP Location: Left Arm, Patient Position: Sitting, Cuff Size: Large)   Pulse (!) 121   Ht 5' 6.25" (1.683 m)   Wt 158 lb 6.4 oz (71.8 kg)   LMP 08/07/1998   BMI 25.37 kg/m     Physical Exam Constitutional:      Appearance: Normal appearance.  Chest:  Breasts:    Right: Mass and tenderness present. No nipple discharge.     Left: No swelling, bleeding, inverted nipple, mass or skin change.    Lymphadenopathy:     Upper Body:     Right upper body: No supraclavicular, axillary or pectoral adenopathy.  Neurological:     General: No focal deficit present.     Mental Status: She is alert.  Psychiatric:        Mood and Affect: Mood normal.     Assessment/Plan: 1. Breast hematoma - reassured pt about findings.  Given trauma, breast hematoma causing the breast mass if very likely.  Had normal MMG 11/2021.  Will plan to recheck in 6 weeks to ensure this is decreasing in size and bruising has resolved.

## 2022-04-12 ENCOUNTER — Other Ambulatory Visit: Payer: Self-pay | Admitting: Gastroenterology

## 2022-04-12 ENCOUNTER — Other Ambulatory Visit (HOSPITAL_BASED_OUTPATIENT_CLINIC_OR_DEPARTMENT_OTHER): Payer: Self-pay

## 2022-04-12 ENCOUNTER — Encounter: Payer: Self-pay | Admitting: Internal Medicine

## 2022-04-13 ENCOUNTER — Encounter (HOSPITAL_BASED_OUTPATIENT_CLINIC_OR_DEPARTMENT_OTHER): Payer: Self-pay | Admitting: Pharmacist

## 2022-04-13 ENCOUNTER — Other Ambulatory Visit (HOSPITAL_BASED_OUTPATIENT_CLINIC_OR_DEPARTMENT_OTHER): Payer: Self-pay

## 2022-04-19 ENCOUNTER — Other Ambulatory Visit (HOSPITAL_BASED_OUTPATIENT_CLINIC_OR_DEPARTMENT_OTHER): Payer: Self-pay

## 2022-04-19 MED ORDER — INFLUENZA VAC A&B SA ADJ QUAD 0.5 ML IM PRSY
PREFILLED_SYRINGE | INTRAMUSCULAR | 0 refills | Status: DC
  Filled 2022-04-19: qty 0.5, 1d supply, fill #0

## 2022-04-20 ENCOUNTER — Other Ambulatory Visit (HOSPITAL_BASED_OUTPATIENT_CLINIC_OR_DEPARTMENT_OTHER): Payer: Self-pay

## 2022-04-20 MED ORDER — PRAZOSIN HCL 2 MG PO CAPS
2.0000 mg | ORAL_CAPSULE | Freq: Every day | ORAL | 3 refills | Status: DC
  Filled 2022-04-20: qty 30, 30d supply, fill #0
  Filled 2022-05-28: qty 30, 30d supply, fill #1
  Filled 2022-06-30: qty 30, 30d supply, fill #2
  Filled 2022-08-04: qty 30, 30d supply, fill #3

## 2022-04-20 MED ORDER — AMANTADINE HCL 100 MG PO CAPS
100.0000 mg | ORAL_CAPSULE | Freq: Every day | ORAL | 3 refills | Status: DC
  Filled 2022-04-20: qty 30, 30d supply, fill #0

## 2022-04-20 NOTE — Telephone Encounter (Signed)
Received another clearance request from [facility], Dr. Hart Robinsons. Routing back to [employer]. Date of surgery is pending clearance. All information concerning clearance is the same a previously documented.

## 2022-04-20 NOTE — Telephone Encounter (Signed)
Agree with Chales Abrahams prior recommendation for tele visit for clearance (see 8/1 note below), please arrange. Last OV 10/2021. No anticoags on file to hold.

## 2022-04-21 NOTE — Telephone Encounter (Addendum)
I s/w the pt and she is not going to have her surgery with Dr. Theda Sers and has changed to Dr. Rhona Raider.   I stated that we have not received a clearance from Dr. Jerald Kief office. Pt tells me that they got the clearance needed from PCP.   Pt said they did not need cardiac clearance, no h/o CAD or on any blood thinners.   I stated that if they need a clearance to please have their office fax request to [place].

## 2022-05-03 ENCOUNTER — Other Ambulatory Visit (HOSPITAL_BASED_OUTPATIENT_CLINIC_OR_DEPARTMENT_OTHER): Payer: Self-pay

## 2022-05-03 ENCOUNTER — Ambulatory Visit: Payer: Medicare Other | Admitting: Physician Assistant

## 2022-05-03 ENCOUNTER — Encounter: Payer: Self-pay | Admitting: Physician Assistant

## 2022-05-03 VITALS — BP 120/70 | HR 96 | Ht 66.0 in | Wt 161.1 lb

## 2022-05-03 DIAGNOSIS — K5900 Constipation, unspecified: Secondary | ICD-10-CM

## 2022-05-03 DIAGNOSIS — R110 Nausea: Secondary | ICD-10-CM

## 2022-05-03 DIAGNOSIS — K219 Gastro-esophageal reflux disease without esophagitis: Secondary | ICD-10-CM | POA: Diagnosis not present

## 2022-05-03 MED ORDER — ESOMEPRAZOLE MAGNESIUM 40 MG PO CPDR
40.0000 mg | DELAYED_RELEASE_CAPSULE | Freq: Two times a day (BID) | ORAL | 11 refills | Status: DC
  Filled 2022-05-03: qty 60, 30d supply, fill #0
  Filled 2022-05-28: qty 15, 8d supply, fill #1
  Filled 2022-05-29: qty 45, 22d supply, fill #1
  Filled 2022-06-30: qty 60, 30d supply, fill #2
  Filled 2022-08-04: qty 60, 30d supply, fill #3
  Filled 2022-09-01: qty 60, 30d supply, fill #4
  Filled 2022-10-08: qty 60, 30d supply, fill #5
  Filled 2022-11-08: qty 60, 30d supply, fill #6
  Filled 2022-12-04: qty 60, 30d supply, fill #7
  Filled 2023-01-08: qty 60, 30d supply, fill #8
  Filled ????-??-??: fill #9

## 2022-05-03 MED ORDER — FAMOTIDINE 40 MG PO TABS
40.0000 mg | ORAL_TABLET | Freq: Every day | ORAL | 2 refills | Status: DC
  Filled 2022-05-03: qty 30, 30d supply, fill #0
  Filled 2022-05-28: qty 30, 30d supply, fill #1

## 2022-05-03 MED ORDER — ONDANSETRON 4 MG PO TBDP
ORAL_TABLET | ORAL | 2 refills | Status: DC
Start: 2022-05-03 — End: 2022-08-15
  Filled 2022-05-03: qty 30, 3d supply, fill #0
  Filled 2022-05-28: qty 30, 3d supply, fill #1
  Filled 2022-06-20: qty 30, 3d supply, fill #2

## 2022-05-03 NOTE — Patient Instructions (Signed)
_______________________________________________________  If you are age 70 or older, your body mass index should be between 23-30. Your Body mass index is 26.01 kg/m. If this is out of the aforementioned range listed, please consider follow up with your Primary Care Provider.  If you are age 45 or younger, your body mass index should be between 19-25. Your Body mass index is 26.01 kg/m. If this is out of the aformentioned range listed, please consider follow up with your Primary Care Provider.   ________________________________________________________  The  GI providers would like to encourage you to use [facility] to communicate with providers for non-urgent requests or questions.  Due to long hold times on the telephone, sending your provider a message by [facility] may be a faster and more efficient way to get a response.  Please allow 48 business hours for a response.  Please remember that this is for non-urgent requests.  _______________________________________________________   Take your Nexium 40 mg twice a day  Take Famotidine 40 mg at bedtime.  Use Zofran 1-2 tablets every 6 hours as needed.   Start a fiber supplement daily and titrate Miralax.  It was a pleasure to see you today!  Thank you for trusting me with your gastrointestinal care!

## 2022-05-03 NOTE — Progress Notes (Signed)
Chief Complaint: Generalized abdominal pain and nausea  Review of pertinent gastrointestinal problems: 1. Routine risk for colon cancer: colonoscopy in [place] in 2006 which was normal with no polyps. She had a surveillance colonoscopy by Dr. Thana Farr in 1829 which was complicated by a colonic perforation. colonoscopy by Dr. Ardis Hughs on 08/18/2014 the anus the Hartman's pouch was evaluated and was completely normal-appearing. Via ostomy the remaining colon examined was normal. 2 polyps were found removed and sent to pathology (hyperplastic); recommended repeat screening exam 08/2024. 2. Colon perforated by Dr. Earlean Shawl during colonoscopy 2015: Following the perforation she had a segmental resection, colostomy, and required a wound VAC while in the hospital. colostomy takedown Dr. [name] 04/3715 3. Reflux esophagitis  EGD DR. Ardis Hughs on 08/18/2014. There was a typical-appearing reflux related short segment ulcerative esophagitis and a 3 cm hiatal hernia. BID PPI recommended.  HPI:    [name] is a 71 year old Caucasian female with a past medical history as listed below including GERD and perforated sigmoid colon from colonoscopy, known to Dr. Ardis Hughs, who was referred to me by Denita Lung, MD for a complaint of generalized abdominal pain and nausea.      11/26/2020 office visit with Dr. Ardis Hughs.  At that time she was again not taking her PPI at the correct time in relation to her meals.  She was bothered by GERD rather chronically.  Also struggled with constipation.  At that time instructed to take Nexium 20 mg 30 minutes prior to her first meal of the day and last meal of the day and take Famotidine 20 mg at night.  Also told to resume MiraLAX 1 dose every single day for her constipation.    03/02/2022 hepatic function panel normal.  Hemoglobin A1c normal.    Today, the patient explains that she still continues to battle her chronic constipation.  She tried taking her MiraLAX every day but this  resulted in diarrhea so she decreased it to twice a week.  Her typical schedule now is that she will take the MiraLAX and the next day will have diarrhea which will continue for the next 3 days and then she will get constipated again and take it again.  Along with this has some generalized abdominal discomfort.    Most worrying to the patient today is that for the past couple of months she has been suffering from nausea which is mostly all day long.  Over the past 2 weeks this was so severe that she basically did not want to leave her house or eat anything.  The last 2 days though she has not had any nausea.  She took some very old Zofran which really did not help and also tried some Alka-Seltzer chews which did not help.  She does have breakthrough heartburn and reflux symptoms.  It sounds like she is religious about using her Famotidine 20 mg at night but typically only gets 1 dose of Nexium 40 mg in during the day at some point because she tells me it is hard to remember to take before meals because sometimes she skips a meal.    Denies fever, chills, weight loss or blood in her stool.  Past Medical History:  Diagnosis Date   Anxiety    Complication of anesthesia    past hx. laryngospasm following 2 past surgeries, not recent   Depression    Exertional dyspnea 08/04/2019   GERD (gastroesophageal reflux disease)    Hiatal hernia 10/17/2019   Hyperlipidemia  Insomnia    periodically uses med.   Perforated sigmoid colon ([place]) 04/28/2014   during colonoscopy, "rupture colon"became septic, has colostomy and open surgical wound   Prediabetes 08/09/2017   Suicide attempt ([place])    Vitamin D deficiency     Past Surgical History:  Procedure Laterality Date   ABDOMINAL HYSTERECTOMY  0160   TAH/BSO   APPLICATION OF WOUND VAC  04/28/2014   Procedure: APPLICATION OF WOUND VAC;  Surgeon: Rolm Bookbinder, MD;  Location: [facility];  Service: General;;   BLEPHAROPLASTY  2010   BREAST SURGERY  2001    Breast reduction   COLOSTOMY N/A 04/28/2014   Procedure: COLOSTOMY;  Surgeon: Rolm Bookbinder, MD;  Location: [facility];  Service: General;  Laterality: N/A;   COLOSTOMY REVISION N/A 04/28/2014   Procedure: COLON RESECTION SIGMOID;  Surgeon: Rolm Bookbinder, MD;  Location: [facility];  Service: General;  Laterality: N/A;   COLOSTOMY TAKEDOWN N/A 09/02/2014   Procedure: LAPAROSCOPIC COLOSTOMY REVERSAL;  Surgeon: [name], MD;  Location: [facility];  Service: General;  Laterality: N/A;   COSMETIC SURGERY     DEBRIDEMENT TENNIS ELBOW     DILATION AND CURETTAGE OF UTERUS     x3, hysteroscopy   ELBOW SURGERY  2007   EYE SURGERY     Blepharoplasty   LAPAROTOMY N/A 04/28/2014   Procedure: EXPLORATORY LAPAROTOMY,SIGMOID COLECTOMY;  Surgeon: Rolm Bookbinder, MD;  Location: [facility];  Service: General;  Laterality: N/A;   ORIF WRIST FRACTURE Left 02/19/2021   Procedure: Left wrist open reduction internal fixation and repair as indicated;  Surgeon: Iran Planas, MD;  Location: [facility];  Service: Orthopedics;  Laterality: Left;  73mn   REDUCTION MAMMAPLASTY Bilateral    scar and adhesion repair  06/01/15   and liposuction   ULNAR TUNNEL RELEASE Left 2010    Current Outpatient Medications  Medication Sig Dispense Refill   acetaminophen-codeine (TYLENOL #3) 300-30 MG tablet 1 tab po q6hours prn pain (Patient not taking: Reported on 04/04/2022) 15 tablet 0   amantadine (SYMMETREL) 100 MG capsule Take 1 capsule (100 mg total) by mouth at bedtime. 30 capsule 3   amoxicillin (AMOXIL) 500 MG capsule 1 tab by mouth 4 times a day until complete 28 capsule 0   Ascorbic Acid (VITAMIN C) 1000 MG tablet Take 1,000 mg by mouth daily.     COVID-19 mRNA bivalent vaccine, Pfizer, (PFIZER COVID-19 VAC BIVALENT) injection Inject into the muscle. (Patient not taking: Reported on 04/04/2022) 0.3 mL 0   docusate sodium (COLACE) 100 MG capsule Take 1 capsule (100 mg total) by mouth 2 (two) times daily. (Patient not taking: Reported  on 04/04/2022) 10 capsule 0   esomeprazole (NEXIUM) 40 MG capsule TAKE 1 CAPSULE BY MOUTH ONCE DAILY BEFORE BREAKFAST 30 capsule 1   eszopiclone (LUNESTA) 2 MG TABS tablet Take 1 tablet (2 mg total) by mouth at bedtime 30 tablet 3   eszopiclone (LUNESTA) 2 MG TABS tablet Take 1 tablet (2 mg) by mouth once at bedtime. 30 tablet 3   eszopiclone (LUNESTA) 2 MG TABS tablet Take 1 tablet by mouth daily at bedtime. 30 tablet 2   Eszopiclone 3 MG TABS Take 1 tablet (3 mg total) by mouth at bedtime. (Patient not taking: Reported on 04/04/2022) 30 tablet 2   famotidine (PEPCID) 20 MG tablet Take 1 tablet (20 mg total) by mouth at bedtime. Please make appointment for further refills, [phone] 15 tablet 0   Ferrous Sulfate (SLOW FE PO) Take 1 tablet  by mouth daily.     gabapentin (NEURONTIN) 600 MG tablet Take 1 tablet (600 mg total) by mouth every evening. 90 tablet 3   ibuprofen (ADVIL,MOTRIN) 200 MG tablet Take 800 mg by mouth every 6 (six) hours as needed for mild pain or moderate pain.     influenza vaccine adjuvanted (FLUAD) 0.5 ML injection Inject into the muscle. (Patient not taking: Reported on 04/04/2022) 0.5 mL 0   influenza vaccine adjuvanted (FLUAD) 0.5 ML injection Inject into the muscle. 0.5 mL 0   lamoTRIgine (LAMICTAL) 150 MG tablet Take 1 tablet by mouth once a day 30 tablet 2   methocarbamol (ROBAXIN) 500 MG tablet Take 1 tablet by mouth every 4-6 hours as needed. (Patient not taking: Reported on 04/04/2022) 40 tablet 0   prazosin (MINIPRESS) 2 MG capsule Take 1 capsule (2 mg total) by mouth at bedtime. 30 capsule 3   Tdap (BOOSTRIX) 5-2.5-18.5 LF-MCG/0.5 injection Inject into the muscle. (Patient not taking: Reported on 04/04/2022) 0.5 mL 0   Vitamin D, Ergocalciferol, (DRISDOL) 1.25 MG (50000 UNIT) CAPS capsule Take 1 capsule by mouth every 7 days. 12 capsule 3   No current facility-administered medications for this visit.    Allergies as of 05/03/2022 - Review Complete 05/03/2022   Allergen Reaction Noted   Bee venom Swelling 05/06/2013   Statins Other (See Comments) 10/18/2012    Family History  Problem Relation Age of Onset   Hyperlipidemia Father    Hypertension Father    Diabetes Father    Cancer Father        History of bladder cancer   Heart disease Father    Other Father        4 ft intestinal removed-had gangrene in gallbladder, then spread to intestines   Heart attack Father    Hypertension Mother    Diabetes Mother    Heart disease Maternal Grandmother    Stroke Maternal Grandfather    Hyperlipidemia Brother    Cancer Paternal Grandmother        uterine   Cancer Paternal Grandfather        pancreatic cancer   Esophageal cancer Sister     Social History   Socioeconomic History   Marital status: Divorced    Spouse name: Not on file   Number of children: Not on file   Years of education: Not on file   Highest education level: Not on file  Occupational History   Not on file  Tobacco Use   Smoking status: Former    Packs/day: 0.50    Types: Cigarettes    Quit date: 09/01/2003    Years since quitting: 18.6   Smokeless tobacco: Never   Tobacco comments:    occasional  Vaping Use   Vaping Use: Every day   Substances: Nicotine, Nicotine-salt  Substance and Sexual Activity   Alcohol use: Yes    Alcohol/week: 14.0 standard drinks of alcohol    Types: 14 Cans of beer per week   Drug use: No   Sexual activity: Not Currently    Birth control/protection: Surgical    Comment: TAH/BSO  Other Topics Concern   Not on file  Social History Narrative   College Degree   Registered Nurse   Exercises      Social Determinants of Health   Financial Resource Strain: Not on file  Food Insecurity: Not on file  Transportation Needs: Not on file  Physical Activity: Not on file  Stress: Not on file  Social Connections:  Not on file  Intimate Partner Violence: Not on file    Review of Systems:    Constitutional: No weight loss, fever or  chills Cardiovascular: No chest pain Respiratory: No SOB  Gastrointestinal: See HPI and otherwise negative   Physical Exam:  Vital signs: BP 120/70   Pulse 96   Ht '5\' 6"'$  (1.676 m)   Wt 161 lb 2 oz (73.1 kg)   LMP 08/07/1998   BMI 26.01 kg/m    Constitutional:   Caucasian female appears to be in NAD, Well developed, Well nourished, alert and cooperative Respiratory: Respirations even and unlabored. Lungs clear to auscultation bilaterally.   No wheezes, crackles, or rhonchi.  Cardiovascular: Normal S1, S2. No MRG. Regular rate and rhythm. No peripheral edema, cyanosis or pallor.  Gastrointestinal:  Soft, nondistended, nontender. No rebound or guarding. Normal bowel sounds. No appreciable masses or hepatomegaly. Rectal:  Not performed.  Psychiatric: Oriented to person, place and time. Demonstrates good judgement and reason without abnormal affect or behaviors.  RELEVANT LABS AND IMAGING: CBC    Component Value Date/Time   WBC 5.1 12/03/2020 0841   RBC 4.17 12/03/2020 0841   HGB 10.2 (L) 12/03/2020 0841   HGB 9.9 (L) 04/21/2019 1441   HGB 11.9 (L) 06/14/2015 1528   HCT 32.9 (L) 12/03/2020 0841   HCT 31.7 (L) 04/21/2019 1441   PLT 351 12/03/2020 0841   PLT 449 04/21/2019 1441   MCV 78.9 (L) 12/03/2020 0841   MCV 77 (L) 04/21/2019 1441   MCH 24.5 (L) 12/03/2020 0841   MCHC 31.0 12/03/2020 0841   RDW 17.2 (H) 12/03/2020 0841   RDW 15.4 04/21/2019 1441   LYMPHSABS 1.7 12/03/2020 0841   LYMPHSABS 2.3 04/21/2019 1441   MONOABS 0.4 12/03/2020 0841   EOSABS 0.2 12/03/2020 0841   EOSABS 0.3 04/21/2019 1441   BASOSABS 0.0 12/03/2020 0841   BASOSABS 0.0 04/21/2019 1441    CMP     Component Value Date/Time   NA 138 10/31/2021 0953   K 4.1 10/31/2021 0953   CL 98 10/31/2021 0953   CO2 20 10/31/2021 0953   GLUCOSE 108 (H) 10/31/2021 0953   GLUCOSE 137 (H) 12/03/2020 0841   BUN 5 (L) 10/31/2021 0953   CREATININE 0.98 10/31/2021 0953   CREATININE 0.99 08/08/2017 1629    CALCIUM 9.3 10/31/2021 0953   PROT 7.3 03/02/2022 1534   ALBUMIN 5.2 (H) 03/02/2022 1534   AST 18 03/02/2022 1534   ALT 12 03/02/2022 1534   ALKPHOS 64 03/02/2022 1534   BILITOT 0.2 03/02/2022 1534   GFRNONAA >60 12/03/2020 0841   GFRNONAA 76 04/14/2014 1427   GFRAA 72 08/27/2019 1112   GFRAA 88 04/14/2014 1427    Assessment: 1.  Nausea: Previous EGD in 2016 with typical appearing reflux related short segment ulcerative esophagitis and a hiatal hernia, continues on Nexium 40 mg but does not usually use this twice a day as well as Famotidine 20 mg at night, now with increase in nausea; consider relation to known gastritis/GERD most likely 2.  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  Discussed with patient that I would try doing a fiber supplement.  She asked me if this is in pill form because otherwise she will not take it.  Told her to take a fiber supplement daily to see if this helps at all  with the consistency of her stools. 2.  Discussed titration of the MiraLAX.  If a full dose is too strong for her she can try taking a half or quarter of a dose every day to see if this is helpful.  She just needs to titrated a little bit. 3.  Refilled Zofran 4 mg ODT 1-2 tabs every 4-6 hours as needed for nausea #30 with 2 refills. 4.  Refilled Nexium 40 mg twice daily, 30-60 minutes before meals if she is able to do so.  Discussed today that it seems to be difficult for her to remember, would recommend that she go ahead and just take one in the morning and then set a timer for around 6 or 7 or whenever she typically eats dinner so that she is not skipping doses. 5.  Refilled Famotidine but increased to 40 mg nightly #30 with 11 refills. 6.  Patient to follow in clinic with me in 6 weeks or sooner if necessary.  She will call and let me  know if she continues with nausea.  Note sent to Dr. Silverio Decamp in Dr. Ardis Hughs absence.  Ellouise Newer, PA-C [facility] 05/03/2022, 2:01 PM  Cc: Denita Lung, MD

## 2022-05-05 ENCOUNTER — Telehealth: Payer: Self-pay

## 2022-05-05 NOTE — Telephone Encounter (Signed)
   Name: [name]  DOB: [birth year]  MRN: [id number]  Primary Cardiologist: Skeet Latch, MD   Preoperative team, please contact this patient and set up a phone call appointment for further preoperative risk assessment. Please obtain consent and complete medication review. Thank you for your help.  I confirm that guidance regarding antiplatelet and oral anticoagulation therapy has been completed and, if necessary, noted below (none requested).   Lenna Sciara, NP 05/05/2022, 4:04 PM [name]

## 2022-05-05 NOTE — Telephone Encounter (Signed)
1st attempt to reach patient to schedule appointment for preop clearance

## 2022-05-05 NOTE — Telephone Encounter (Signed)
   Pre-operative Risk Assessment    Patient Name: [name]  DOB: [birth year] MRN: [id number]     Request for Surgical Clearance    Procedure:  Right total knee arthoplasty   Date of Surgery:  Clearance TBD                                  Surgeon:  Dr. Hart Robinsons  Surgeon's Group or Practice Name:  [facility] Phone number:  [phone] Fax number:  [phone]   Type of Clearance Requested:   - Medical    Type of Anesthesia:  Spinal    Additional requests/questions:    SignedMendel Ryder   05/05/2022, 2:47 PM

## 2022-05-08 ENCOUNTER — Ambulatory Visit (HOSPITAL_BASED_OUTPATIENT_CLINIC_OR_DEPARTMENT_OTHER): Payer: Medicare Other | Admitting: Obstetrics & Gynecology

## 2022-05-08 ENCOUNTER — Encounter (HOSPITAL_BASED_OUTPATIENT_CLINIC_OR_DEPARTMENT_OTHER): Payer: Self-pay | Admitting: Obstetrics & Gynecology

## 2022-05-08 VITALS — BP 110/58 | HR 121 | Ht 66.0 in | Wt 160.4 lb

## 2022-05-08 DIAGNOSIS — Z87828 Personal history of other (healed) physical injury and trauma: Secondary | ICD-10-CM

## 2022-05-08 DIAGNOSIS — N6489 Other specified disorders of breast: Secondary | ICD-10-CM | POA: Diagnosis not present

## 2022-05-08 NOTE — Telephone Encounter (Signed)
Called pt regarding surgical clearance below.  Per pt, we can disregard this request, as she states she isn't going to proceed with surgery with [facility] and she was going with someone else.  Will close encounter and route to requesting surgeon's office to make them aware.

## 2022-05-09 NOTE — Progress Notes (Signed)
GYNECOLOGY  VISIT  CC:   breast recheck  HPI: 71 y.o. G33P2 Divorced White or Caucasian female here for recheck of breast after have large hematoma present after a fall.  Bruising is all gone but still feeling a small area in the breast.  It is much smaller than it was in the past.  Non tender.  Ended up having jaw fracture so teeth are going to take months/up to a year before finally fixed.   Past Medical History:  Diagnosis Date   Anxiety    Complication of anesthesia    past hx. laryngospasm following 2 past surgeries, not recent   Depression    Exertional dyspnea 08/04/2019   GERD (gastroesophageal reflux disease)    Hiatal hernia 10/17/2019   Hyperlipidemia    Insomnia    periodically uses med.   Perforated sigmoid colon ([place]) 04/28/2014   during colonoscopy, "rupture colon"became septic, has colostomy and open surgical wound   Prediabetes 08/09/2017   Suicide attempt ([place])    Vitamin D deficiency     MEDS:   Current Outpatient Medications on File Prior to Visit  Medication Sig Dispense Refill   Ascorbic Acid (VITAMIN C) 1000 MG tablet Take 1,000 mg by mouth daily.     COVID-19 mRNA bivalent vaccine, Pfizer, (PFIZER COVID-19 VAC BIVALENT) injection Inject into the muscle. 0.3 mL 0   esomeprazole (NEXIUM) 40 MG capsule Take 1 capsule (40 mg total) by mouth 2 (two) times daily before a meal. 60 capsule 11   eszopiclone (LUNESTA) 2 MG TABS tablet Take 1 tablet by mouth daily at bedtime. 30 tablet 2   famotidine (PEPCID) 20 MG tablet Take 1 tablet (20 mg total) by mouth at bedtime. Please make appointment for further refills, [phone]. 15 tablet 0   famotidine (PEPCID) 40 MG tablet Take 1 tablet (40 mg total) by mouth daily. 30 tablet 2   Ferrous Sulfate (SLOW FE PO) Take 1 tablet by mouth daily.     gabapentin (NEURONTIN) 600 MG tablet Take 1 tablet (600 mg total) by mouth every evening. 90 tablet 3   ibuprofen (ADVIL,MOTRIN) 200 MG tablet Take 800 mg by mouth every 6 (six)  hours as needed for mild pain or moderate pain.     influenza vaccine adjuvanted (FLUAD) 0.5 ML injection Inject into the muscle. 0.5 mL 0   influenza vaccine adjuvanted (FLUAD) 0.5 ML injection Inject into the muscle. 0.5 mL 0   lamoTRIgine (LAMICTAL) 150 MG tablet Take 1 tablet by mouth once a day 30 tablet 2   ondansetron (ZOFRAN-ODT) 4 MG disintegrating tablet Take 1-2 tablets every 6 hours as needed 30 tablet 2   prazosin (MINIPRESS) 2 MG capsule Take 1 capsule (2 mg total) by mouth at bedtime. 30 capsule 3   Tdap (BOOSTRIX) 5-2.5-18.5 LF-MCG/0.5 injection Inject into the muscle. 0.5 mL 0   Vitamin D, Ergocalciferol, (DRISDOL) 1.25 MG (50000 UNIT) CAPS capsule Take 1 capsule by mouth every 7 days. 12 capsule 3   docusate sodium (COLACE) 100 MG capsule Take 1 capsule (100 mg total) by mouth 2 (two) times daily. (Patient not taking: Reported on 04/04/2022) 10 capsule 0   No current facility-administered medications on file prior to visit.    ALLERGIES: Bee venom and Statins  SH:  divorced, non smoker  Review of Systems  Constitutional: Negative.     PHYSICAL EXAMINATION:    BP (!) 110/58 (BP Location: Right Arm, Patient Position: Sitting, Cuff Size: Normal)   Pulse (!) 121  Ht '5\' 6"'$  (1.676 m) Comment: Reported  Wt 160 lb 6.4 oz (72.8 kg)   LMP 08/07/1998   BMI 25.89 kg/m     General appearance: alert, cooperative and appears stated age Neck: no adenopathy Breasts:  right breast with mobile, cystic feeling lesion about 3-4cm, no bruising, no nipple discharge, no LAD  Assessment/Plan: 1. History of trauma of breast - small cystic feeling lesion present but much smaller than it was.  Pt will monitor.  Has normal MMG about 6 months ago.  Do not feel she needs any additional imaging at this point as lesion keeps getting smaller.  However, if this does not keep resolving, advised pt to call and let me know.  Would order diagnostic imaging at that point.    2. Breast hematoma -  much improved

## 2022-05-16 ENCOUNTER — Ambulatory Visit (INDEPENDENT_AMBULATORY_CARE_PROVIDER_SITE_OTHER): Payer: Medicare Other | Admitting: Family Medicine

## 2022-05-16 ENCOUNTER — Encounter: Payer: Self-pay | Admitting: Internal Medicine

## 2022-05-16 ENCOUNTER — Encounter: Payer: Self-pay | Admitting: Family Medicine

## 2022-05-16 VITALS — BP 138/80 | HR 80 | Temp 98.6°F | Wt 162.0 lb

## 2022-05-16 DIAGNOSIS — R519 Headache, unspecified: Secondary | ICD-10-CM | POA: Diagnosis not present

## 2022-05-16 NOTE — Progress Notes (Signed)
   Subjective:    Patient ID: [name], female    DOB: 1951-05-03, 71 y.o.   MRN: [id number]  HPI She complains of a 70-monthhistory of headache that starts behind the eyes and into the occipital area.  She is not sure where it starts first.  She describes it as a dull and constant and usually occurs in the afternoon.  No sneezing, itchy watery eyes, rhinorrhea, postnasal drainage, weakness, numbness, tingling.  No blurred or double vision and only rare nausea.  She has tried ibuprofen 1 time at 800 mg with no success.  She states that she is under no undue stress.  Cannot relate this to reading.   Review of Systems     Objective:   Physical Exam Alert and in no distress.  EOMI other cranial nerves grossly intact.  No tenderness over sinuses are in the occipital area.  Tympanic membranes and canals are normal. Pharyngeal area is normal. Neck is supple without adenopathy or thyromegaly. Cardiac exam shows a regular sinus rhythm without murmurs or gallops. Lungs are clear to auscultation.        Assessment & Plan:  Nonintractable headache, unspecified chronicity pattern, unspecified headache type I explained that at this point there is a nonspecific headache.  I explained that I did not think this had any do with tumor, migraine, allergies, infection.  She is to take 800 mg 3 times daily of ibuprofen for the next several weeks and see if this helps to go away and if continued difficulty may need to refer to neurology.  She was comfortable with that.

## 2022-05-16 NOTE — Patient Instructions (Signed)
Take 800 mg of ibuprofen 3 times a day regularly .  For the next several weeks to see if we can eliminate the headache if we do that but see what happens after you stop.  Keep having difficulty then will need to go further with neurology

## 2022-05-28 ENCOUNTER — Other Ambulatory Visit (HOSPITAL_BASED_OUTPATIENT_CLINIC_OR_DEPARTMENT_OTHER): Payer: Self-pay

## 2022-05-29 ENCOUNTER — Encounter: Payer: Self-pay | Admitting: Internal Medicine

## 2022-05-29 ENCOUNTER — Other Ambulatory Visit (HOSPITAL_BASED_OUTPATIENT_CLINIC_OR_DEPARTMENT_OTHER): Payer: Self-pay

## 2022-05-29 DIAGNOSIS — R262 Difficulty in walking, not elsewhere classified: Secondary | ICD-10-CM | POA: Diagnosis not present

## 2022-05-29 DIAGNOSIS — M25661 Stiffness of right knee, not elsewhere classified: Secondary | ICD-10-CM | POA: Diagnosis not present

## 2022-05-29 DIAGNOSIS — M1731 Unilateral post-traumatic osteoarthritis, right knee: Secondary | ICD-10-CM | POA: Diagnosis not present

## 2022-05-29 MED ORDER — COMIRNATY 30 MCG/0.3ML IM SUSY
PREFILLED_SYRINGE | INTRAMUSCULAR | 0 refills | Status: DC
  Filled 2022-05-29: qty 0.3, 1d supply, fill #0

## 2022-05-31 ENCOUNTER — Telehealth: Payer: Self-pay | Admitting: *Deleted

## 2022-05-31 ENCOUNTER — Other Ambulatory Visit: Payer: Self-pay | Admitting: Orthopaedic Surgery

## 2022-05-31 ENCOUNTER — Telehealth (HOSPITAL_BASED_OUTPATIENT_CLINIC_OR_DEPARTMENT_OTHER): Payer: Self-pay | Admitting: Cardiovascular Disease

## 2022-05-31 ENCOUNTER — Other Ambulatory Visit (HOSPITAL_BASED_OUTPATIENT_CLINIC_OR_DEPARTMENT_OTHER): Payer: Self-pay

## 2022-05-31 MED ORDER — ESZOPICLONE 2 MG PO TABS
ORAL_TABLET | ORAL | 3 refills | Status: DC
  Filled 2022-05-31: qty 30, 30d supply, fill #0
  Filled 2022-06-30 – 2022-07-05 (×2): qty 30, 30d supply, fill #1
  Filled 2022-08-04: qty 30, 30d supply, fill #2
  Filled 2022-09-01: qty 30, 30d supply, fill #3

## 2022-05-31 NOTE — Telephone Encounter (Signed)
Pt scheduled for tele pre op appt 06/16/22 @ 2 pm. Med rec and consent are done.     Patient Consent for Virtual Visit        ALICIANNA [name] has provided verbal consent on 05/31/2022 for a virtual visit (video or telephone).   CONSENT FOR VIRTUAL VISIT FOR:  [name]  By participating in this virtual visit I agree to the following:  I hereby voluntarily request, consent and authorize [facility] and its employed or contracted physicians, physician assistants, nurse practitioners or other licensed health care professionals (the Practitioner), to provide me with telemedicine health care services (the "Services") as deemed necessary by the treating Practitioner. I acknowledge and consent to receive the Services by the Practitioner via telemedicine. I understand that the telemedicine visit will involve communicating with the Practitioner through live audiovisual communication technology and the disclosure of certain medical information by electronic transmission. I acknowledge that I have been given the opportunity to request an in-person assessment or other available alternative prior to the telemedicine visit and am voluntarily participating in the telemedicine visit.  I understand that I have the right to withhold or withdraw my consent to the use of telemedicine in the course of my care at any time, without affecting my right to future care or treatment, and that the Practitioner or I may terminate the telemedicine visit at any time. I understand that I have the right to inspect all information obtained and/or recorded in the course of the telemedicine visit and may receive copies of available information for a reasonable fee.  I understand that some of the potential risks of receiving the Services via telemedicine include:  Delay or interruption in medical evaluation due to technological equipment failure or disruption; Information transmitted may not be sufficient (e.g. poor  resolution of images) to allow for appropriate medical decision making by the Practitioner; and/or  In rare instances, security protocols could fail, causing a breach of personal health information.  Furthermore, I acknowledge that it is my responsibility to provide information about my medical history, conditions and care that is complete and accurate to the best of my ability. I acknowledge that Practitioner's advice, recommendations, and/or decision may be based on factors not within their control, such as incomplete or inaccurate data provided by me or distortions of diagnostic images or specimens that may result from electronic transmissions. I understand that the practice of medicine is not an exact science and that Practitioner makes no warranties or guarantees regarding treatment outcomes. I acknowledge that a copy of this consent can be made available to me via my patient portal ([facility]), or I can request a printed copy by calling the office of [facility].    I understand that my insurance will be billed for this visit.   I have read or had this consent read to me. I understand the contents of this consent, which adequately explains the benefits and risks of the Services being provided via telemedicine.  I have been provided ample opportunity to ask questions regarding this consent and the Services and have had my questions answered to my satisfaction. I give my informed consent for the services to be provided through the use of telemedicine in my medical care

## 2022-05-31 NOTE — Telephone Encounter (Signed)
   Pre-operative Risk Assessment    Patient Name: NIRVANA BLANCHETT  DOB: 04-25-1951 MRN: [id number]      Request for Surgical Clearance    Procedure:   Right knee replacement  Date of Surgery:  Clearance 06/27/22                                 Surgeon:  Dr. Melrose Nakayama Surgeon's Group or Practice Name:  [facility] Phone number:  [phone] Fax number:  [phone]   Type of Clearance Requested:   - Medical  - Pharmacy:  Hold        Type of Anesthesia:  Spinal   Additional requests/questions:   Caller stated they need medical and pharmacy clearance.  Signed, Heloise Beecham   05/31/2022, 10:44 AM

## 2022-05-31 NOTE — Telephone Encounter (Signed)
Pt scheduled for tele pre op appt 06/16/22 @ 2 pm. Med rec and consent are done.    Pt states she is not taking Crestor anymore and she will not take it due to myalgias.

## 2022-05-31 NOTE — Telephone Encounter (Signed)
   Patient Name: [name]  DOB: [birth year] MRN: [id number]  Primary Cardiologist: Skeet Latch, MD  Chart reviewed as part of pre-operative protocol coverage. Given past medical history and time since last visit, based on ACC/AHA guidelines, FARYN SIEG will need phone visit prior to clearance. Will route to callback team for assistance.   She is not on anticoagulant nor antiplatelet that would need held prior to procedure.   Loel Dubonnet, NP 05/31/2022, 11:42 AM

## 2022-06-12 NOTE — Patient Instructions (Signed)
DUE TO COVID-19 ONLY TWO VISITORS  (aged 70 and older)  ARE ALLOWED TO COME WITH YOU AND STAY IN THE WAITING ROOM ONLY DURING PRE OP AND PROCEDURE.   **NO VISITORS ARE ALLOWED IN THE SHORT STAY AREA OR RECOVERY ROOM!!**  IF YOU WILL BE ADMITTED INTO THE HOSPITAL YOU ARE ALLOWED ONLY FOUR SUPPORT PEOPLE DURING VISITATION HOURS ONLY (7 AM -8PM)   The support person(s) must pass our screening, gel in and out, and wear a mask at all times, including in the patient's room. Patients must also wear a mask when staff or their support person are in the room. Visitors GUEST BADGE MUST BE WORN VISIBLY  One adult visitor may remain with you overnight and MUST be in the room by 8 P.M.     Your procedure is scheduled on: 06/27/22   Report to [facility] Main Entrance    Report to admitting at : 5:15 AM   Call this number if you have problems the morning of surgery [phone]   Do not eat food :After Midnight.   After Midnight you may have the following liquids until: 4:30 AM DAY OF SURGERY  Water Black Coffee (sugar ok, NO MILK/CREAM OR CREAMERS)  Tea (sugar ok, NO MILK/CREAM OR CREAMERS) regular and decaf                             Plain Jell-O (NO RED)                                           Fruit ices (not with fruit pulp, NO RED)                                     Popsicles (NO RED)                                                                  Juice: apple, WHITE grape, WHITE cranberry Sports drinks like Gatorade (NO RED)              Drink G2 drink AT : 4:30 AM the day of surgery.      The day of surgery:  Drink ONE (1) Pre-Surgery Clear Ensure or G2 at AM the morning of surgery. Drink in one sitting. Do not sip.  This drink was given to you during your hospital  pre-op appointment visit. Nothing else to drink after completing the  Pre-Surgery Clear Ensure or G2.          If you have questions, please contact your surgeon's office.   Oral Hygiene is also important  to reduce your risk of infection.                                    Remember - BRUSH YOUR TEETH THE MORNING OF SURGERY WITH YOUR REGULAR TOOTHPASTE   Do NOT smoke after Midnight   Take these medicines the morning of surgery with A SIP  OF WATER:prazosin,esomeprazole,famotidine.   DO NOT TAKE ANY ORAL DIABETIC MEDICATIONS DAY OF YOUR SURGERY  Bring CPAP mask and tubing day of surgery.                              You may not have any metal on your body including hair pins, jewelry, and body piercing             Do not wear make-up, lotions, powders, perfumes/cologne, or deodorant  Do not wear nail polish including gel and S&S, artificial/acrylic nails, or any other type of covering on natural nails including finger and toenails. If you have artificial nails, gel coating, etc. that needs to be removed by a nail salon please have this removed prior to surgery or surgery may need to be canceled/ delayed if the surgeon/ anesthesia feels like they are unable to be safely monitored.   Do not shave  48 hours prior to surgery.    Do not bring valuables to the hospital. [place].   Contacts, dentures or bridgework may not be worn into surgery.   Bring small overnight bag day of surgery.   DO NOT [place]. PHARMACY WILL DISPENSE MEDICATIONS LISTED ON YOUR MEDICATION LIST TO YOU DURING YOUR ADMISSION [facility]!    Patients discharged on the day of surgery will not be allowed to drive home.  Someone NEEDS to stay with you for the first 24 hours after anesthesia.   Special Instructions: Bring a copy of your healthcare power of attorney and living will documents         the day of surgery if you haven't scanned them before.              Please read over the following fact sheets you were given: IF YOU HAVE QUESTIONS ABOUT YOUR PRE-OP INSTRUCTIONS PLEASE CALL [phone]    [facility] - Preparing for  Surgery Before surgery, you can play an important role.  Because skin is not sterile, your skin needs to be as free of germs as possible.  You can reduce the number of germs on your skin by washing with CHG (chlorahexidine gluconate) soap before surgery.  CHG is an antiseptic cleaner which kills germs and bonds with the skin to continue killing germs even after washing. Please DO NOT use if you have an allergy to CHG or antibacterial soaps.  If your skin becomes reddened/irritated stop using the CHG and inform your nurse when you arrive at Short Stay. Do not shave (including legs and underarms) for at least 48 hours prior to the first CHG shower.  You may shave your face/neck. Please follow these instructions carefully:  1.  Shower with CHG Soap the night before surgery and the  morning of Surgery.  2.  If you choose to wash your hair, wash your hair first as usual with your  normal  shampoo.  3.  After you shampoo, rinse your hair and body thoroughly to remove the  shampoo.                           4.  Use CHG as you would any other liquid soap.  You can apply chg directly  to the skin and wash  Gently with a scrungie or clean washcloth.  5.  Apply the CHG Soap to your body ONLY FROM THE NECK DOWN.   Do not use on face/ open                           Wound or open sores. Avoid contact with eyes, ears mouth and genitals (private parts).                       Wash face,  Genitals (private parts) with your normal soap.             6.  Wash thoroughly, paying special attention to the area where your surgery  will be performed.  7.  Thoroughly rinse your body with warm water from the neck down.  8.  DO NOT shower/wash with your normal soap after using and rinsing off  the CHG Soap.                9.  Pat yourself dry with a clean towel.            10.  Wear clean pajamas.            11.  Place clean sheets on your bed the night of your first shower and do not  sleep with pets. Day  of Surgery : Do not apply any lotions/deodorants the morning of surgery.  Please wear clean clothes to the hospital/surgery center.  FAILURE TO FOLLOW THESE INSTRUCTIONS MAY RESULT IN THE CANCELLATION OF YOUR SURGERY PATIENT SIGNATURE_________________________________  NURSE SIGNATURE__________________________________  ________________________________________________________________________  [name]  An incentive spirometer is a tool that can help keep your lungs clear and active. This tool measures how well you are filling your lungs with each breath. Taking long deep breaths may help reverse or decrease the chance of developing breathing (pulmonary) problems (especially infection) following: A long period of time when you are unable to move or be active. BEFORE THE PROCEDURE  If the spirometer includes an indicator to show your best effort, your nurse or respiratory therapist will set it to a desired goal. If possible, sit up straight or lean slightly forward. Try not to slouch. Hold the incentive spirometer in an upright position. INSTRUCTIONS FOR USE  Sit on the edge of your bed if possible, or sit up as far as you can in bed or on a chair. Hold the incentive spirometer in an upright position. Breathe out normally. Place the mouthpiece in your mouth and seal your lips tightly around it. Breathe in slowly and as deeply as possible, raising the piston or the ball toward the top of the column. Hold your breath for 3-5 seconds or for as long as possible. Allow the piston or ball to fall to the bottom of the column. Remove the mouthpiece from your mouth and breathe out normally. Rest for a few seconds and repeat Steps 1 through 7 at least 10 times every 1-2 hours when you are awake. Take your time and take a few normal breaths between deep breaths. The spirometer may include an indicator to show your best effort. Use the indicator as a goal to work toward during each  repetition. After each set of 10 deep breaths, practice coughing to be sure your lungs are clear. If you have an incision (the cut made at the time of surgery), support your incision when coughing by placing a pillow or rolled up towels firmly  against it. Once you are able to get out of bed, walk around indoors and cough well. You may stop using the incentive spirometer when instructed by your caregiver.  RISKS AND COMPLICATIONS Take your time so you do not get dizzy or light-headed. If you are in pain, you may need to take or ask for pain medication before doing incentive spirometry. It is harder to take a deep breath if you are having pain. AFTER USE Rest and breathe slowly and easily. It can be helpful to keep track of a log of your progress. Your caregiver can provide you with a simple table to help with this. If you are using the spirometer at home, follow these instructions: [place] IF:  You are having difficultly using the spirometer. You have trouble using the spirometer as often as instructed. Your pain medication is not giving enough relief while using the spirometer. You develop fever of 100.5 F (38.1 C) or higher. SEEK IMMEDIATE MEDICAL CARE IF:  You cough up bloody sputum that had not been present before. You develop fever of 102 F (38.9 C) or greater. You develop worsening pain at or near the incision site. MAKE SURE YOU:  Understand these instructions. Will watch your condition. Will get help right away if you are not doing well or get worse. Document Released: 12/04/2006 Document Revised: 10/16/2011 Document Reviewed: 02/04/2007 [facility] Patient Information 2014 [place], [place].   ________________________________________________________________________

## 2022-06-13 ENCOUNTER — Ambulatory Visit: Payer: Medicare Other | Admitting: Physician Assistant

## 2022-06-13 ENCOUNTER — Encounter: Payer: Self-pay | Admitting: Physician Assistant

## 2022-06-13 VITALS — BP 120/84 | HR 92 | Ht 65.0 in | Wt 166.5 lb

## 2022-06-13 DIAGNOSIS — R110 Nausea: Secondary | ICD-10-CM | POA: Diagnosis not present

## 2022-06-13 DIAGNOSIS — K5900 Constipation, unspecified: Secondary | ICD-10-CM

## 2022-06-13 DIAGNOSIS — K219 Gastro-esophageal reflux disease without esophagitis: Secondary | ICD-10-CM

## 2022-06-13 NOTE — Progress Notes (Signed)
Chief Complaint: Follow up nausea, GERD and constipation  Review of pertinent gastrointestinal problems: 1. Routine risk for colon cancer: colonoscopy in [place] in 2006 which was normal with no polyps. She had a surveillance colonoscopy by Dr. Thana Farr in 8937 which was complicated by a colonic perforation. colonoscopy by Dr. Ardis Hughs on 08/18/2014 the anus the Hartman's pouch was evaluated and was completely normal-appearing. Via ostomy the remaining colon examined was normal. 2 polyps were found removed and sent to pathology (hyperplastic); recommended repeat screening exam 08/2024. 2. Colon perforated by Dr. Earlean Shawl during colonoscopy 2015: Following the perforation she had a segmental resection, colostomy, and required a wound VAC while in the hospital. colostomy takedown Dr. [name] 10/4285 3. Reflux esophagitis  EGD DR. Ardis Hughs on 08/18/2014. There was a typical-appearing reflux related short segment ulcerative esophagitis and a 3 cm hiatal hernia. BID PPI recommended.  HPI:    [name] is a 71 year old female with a past medical history as listed below including GERD and perforated sigmoid colon from colonoscopy, known to Dr. Ardis Hughs, who returns to clinic today for follow-up of her nausea, GERD and constipation.    11/26/2020 office visit with Dr. Ardis Hughs.  At that time she was again not taking her PPI at the correct time in relation to her meals.  She was bothered by GERD rather chronically.  Also struggled with constipation.  At that time instructed to take Nexium 20 mg 30 minutes prior to her first meal of the day and last meal of the day and take Famotidine 20 mg at night.  Also told to resume MiraLAX 1 dose every single day for her constipation.    03/02/2022 hepatic function panel normal.  Hemoglobin A1c normal.    05/03/2022 office visit with me and that time continues to battle with chronic constipation, most wearing for her though was nausea.  At that time recommended trying a fiber  supplement as well as titrating her MiraLAX.  We refilled Zofran and Nexium 40 mg twice a day.  Discussed setting a timer for her second dose.  Also refilled Famotidine but increased to 40 mg nightly.    Today, the patient tells me that she is much much much better.  She has no further reflux and no further nausea.  Her constipation is doing fairly well on full dose of MiraLAX every 3 days but it does give her diarrhea.  She never tried titration of this down lower.  Denies any new complaints or concerns other than some weight gain.    Denies fever, chills, blood in her stool or symptoms that awaken her from sleep.  Past Medical History:  Diagnosis Date   Anxiety    Complication of anesthesia    past hx. laryngospasm following 2 past surgeries, not recent   Depression    Exertional dyspnea 08/04/2019   GERD (gastroesophageal reflux disease)    Hiatal hernia 10/17/2019   Hyperlipidemia    Insomnia    periodically uses med.   Perforated sigmoid colon ([place]) 04/28/2014   during colonoscopy, "rupture colon"became septic, has colostomy and open surgical wound   Prediabetes 08/09/2017   Suicide attempt ([place])    Vitamin D deficiency     Past Surgical History:  Procedure Laterality Date   ABDOMINAL HYSTERECTOMY  6811   TAH/BSO   APPLICATION OF WOUND VAC  04/28/2014   Procedure: APPLICATION OF WOUND VAC;  Surgeon: Rolm Bookbinder, MD;  Location: [facility];  Service: General;;   BLEPHAROPLASTY  2010  BREAST SURGERY  2001   Breast reduction   COLOSTOMY N/A 04/28/2014   Procedure: COLOSTOMY;  Surgeon: Rolm Bookbinder, MD;  Location: [facility];  Service: General;  Laterality: N/A;   COLOSTOMY REVISION N/A 04/28/2014   Procedure: COLON RESECTION SIGMOID;  Surgeon: Rolm Bookbinder, MD;  Location: [facility];  Service: General;  Laterality: N/A;   COLOSTOMY TAKEDOWN N/A 09/02/2014   Procedure: LAPAROSCOPIC COLOSTOMY REVERSAL;  Surgeon: [name], MD;  Location: [facility];  Service: General;  Laterality:  N/A;   COSMETIC SURGERY     DEBRIDEMENT TENNIS ELBOW     DILATION AND CURETTAGE OF UTERUS     x3, hysteroscopy   ELBOW SURGERY  2007   EYE SURGERY     Blepharoplasty   LAPAROTOMY N/A 04/28/2014   Procedure: EXPLORATORY LAPAROTOMY,SIGMOID COLECTOMY;  Surgeon: Rolm Bookbinder, MD;  Location: [facility];  Service: General;  Laterality: N/A;   ORIF WRIST FRACTURE Left 02/19/2021   Procedure: Left wrist open reduction internal fixation and repair as indicated;  Surgeon: Iran Planas, MD;  Location: [facility];  Service: Orthopedics;  Laterality: Left;  9mn   REDUCTION MAMMAPLASTY Bilateral    scar and adhesion repair  06/01/15   and liposuction   ULNAR TUNNEL RELEASE Left 2010    Current Outpatient Medications  Medication Sig Dispense Refill   esomeprazole (NEXIUM) 40 MG capsule Take 1 capsule (40 mg total) by mouth 2 (two) times daily before a meal. 60 capsule 11   eszopiclone (LUNESTA) 2 MG TABS tablet Take 1 tablet by mouth daily at bedtime. 30 tablet 3   Ferrous Sulfate (SLOW FE PO) Take 1 tablet by mouth daily.     gabapentin (NEURONTIN) 600 MG tablet Take 1 tablet (600 mg total) by mouth every evening. 90 tablet 3   ibuprofen (ADVIL,MOTRIN) 200 MG tablet Take 800 mg by mouth every 6 (six) hours as needed for mild pain or moderate pain.     Inulin (FIBER CHOICE PO) Take 1 capsule by mouth daily.     lamoTRIgine (LAMICTAL) 150 MG tablet Take 1 tablet by mouth once a day 30 tablet 2   ondansetron (ZOFRAN-ODT) 4 MG disintegrating tablet Take 1-2 tablets every 6 hours as needed 30 tablet 2   prazosin (MINIPRESS) 2 MG capsule Take 1 capsule (2 mg total) by mouth at bedtime. 30 capsule 3   Vitamin D, Ergocalciferol, (DRISDOL) 1.25 MG (50000 UNIT) CAPS capsule Take 1 capsule by mouth every 7 days. (Patient taking differently: Take 50,000 Units by mouth every Thursday.) 12 capsule 3   No current facility-administered medications for this visit.    Allergies as of 06/13/2022 - Review Complete  06/13/2022  Allergen Reaction Noted   Bee venom Swelling 05/06/2013   Statins Other (See Comments) 10/18/2012    Family History  Problem Relation Age of Onset   Hyperlipidemia Father    Hypertension Father    Diabetes Father    Cancer Father        History of bladder cancer   Heart disease Father    Other Father        4 ft intestinal removed-had gangrene in gallbladder, then spread to intestines   Heart attack Father    Hypertension Mother    Diabetes Mother    Heart disease Maternal Grandmother    Stroke Maternal Grandfather    Hyperlipidemia Brother    Cancer Paternal Grandmother        uterine   Cancer Paternal Grandfather  pancreatic cancer   Esophageal cancer Sister     Social History   Socioeconomic History   Marital status: Divorced    Spouse name: Not on file   Number of children: 2   Years of education: Not on file   Highest education level: Not on file  Occupational History   Occupation: retired  Tobacco Use   Smoking status: Former    Packs/day: 0.50    Types: Cigarettes    Quit date: 09/01/2003    Years since quitting: 18.7   Smokeless tobacco: Never   Tobacco comments:    occasional  Vaping Use   Vaping Use: Every day   Substances: Nicotine, Nicotine-salt  Substance and Sexual Activity   Alcohol use: Yes    Alcohol/week: 14.0 standard drinks of alcohol    Types: 14 Cans of beer per week   Drug use: No   Sexual activity: Not Currently    Birth control/protection: Surgical    Comment: TAH/BSO  Other Topics Concern   Not on file  Social History Narrative   College Degree   Registered Nurse   Exercises      Social Determinants of Health   Financial Resource Strain: Not on file  Food Insecurity: Not on file  Transportation Needs: Not on file  Physical Activity: Not on file  Stress: Not on file  Social Connections: Not on file  Intimate Partner Violence: Not on file    Review of Systems:    Constitutional: No weight loss,  fever or chills Cardiovascular: No chest pain   Respiratory: No SOB  Gastrointestinal: See HPI and otherwise negative   Physical Exam:  Vital signs: BP 120/84 (BP Location: Left Arm, Patient Position: Sitting, Cuff Size: Normal)   Pulse 92   Ht '5\' 5"'$  (1.651 m) Comment: height measured without shoes  Wt 166 lb 8 oz (75.5 kg)   LMP 08/07/1998   BMI 27.71 kg/m    Constitutional:   Pleasant Caucasian female appears to be in NAD, Well developed, Well nourished, alert and cooperative Respiratory: Respirations even and unlabored. Lungs clear to auscultation bilaterally.   No wheezes, crackles, or rhonchi.  Cardiovascular: Normal S1, S2. No MRG. Regular rate and rhythm. No peripheral edema, cyanosis or pallor.  Gastrointestinal:  Soft, nondistended, nontender. No rebound or guarding. Normal bowel sounds. No appreciable masses or hepatomegaly. Rectal:  Not performed.  Psychiatric:  Demonstrates good judgement and reason without abnormal affect or behaviors.  RELEVANT LABS AND IMAGING: CBC    Component Value Date/Time   WBC 5.1 12/03/2020 0841   RBC 4.17 12/03/2020 0841   HGB 10.2 (L) 12/03/2020 0841   HGB 9.9 (L) 04/21/2019 1441   HGB 11.9 (L) 06/14/2015 1528   HCT 32.9 (L) 12/03/2020 0841   HCT 31.7 (L) 04/21/2019 1441   PLT 351 12/03/2020 0841   PLT 449 04/21/2019 1441   MCV 78.9 (L) 12/03/2020 0841   MCV 77 (L) 04/21/2019 1441   MCH 24.5 (L) 12/03/2020 0841   MCHC 31.0 12/03/2020 0841   RDW 17.2 (H) 12/03/2020 0841   RDW 15.4 04/21/2019 1441   LYMPHSABS 1.7 12/03/2020 0841   LYMPHSABS 2.3 04/21/2019 1441   MONOABS 0.4 12/03/2020 0841   EOSABS 0.2 12/03/2020 0841   EOSABS 0.3 04/21/2019 1441   BASOSABS 0.0 12/03/2020 0841   BASOSABS 0.0 04/21/2019 1441    CMP     Component Value Date/Time   NA 138 10/31/2021 0953   K 4.1 10/31/2021 0953   CL 98  10/31/2021 0953   CO2 20 10/31/2021 0953   GLUCOSE 108 (H) 10/31/2021 0953   GLUCOSE 137 (H) 12/03/2020 0841   BUN 5 (L)  10/31/2021 0953   CREATININE 0.98 10/31/2021 0953   CREATININE 0.99 08/08/2017 1629   CALCIUM 9.3 10/31/2021 0953   PROT 7.3 03/02/2022 1534   ALBUMIN 5.2 (H) 03/02/2022 1534   AST 18 03/02/2022 1534   ALT 12 03/02/2022 1534   ALKPHOS 64 03/02/2022 1534   BILITOT 0.2 03/02/2022 1534   GFRNONAA >60 12/03/2020 0841   GFRNONAA 76 04/14/2014 1427   GFRAA 72 08/27/2019 1112   GFRAA 88 04/14/2014 1427    Assessment: 1.  Nausea/GERD: Resolved with Nexium 40 twice daily and Famotidine 40 mg nightly; likely related to gastritis 2.  Chronic constipation: Does okay with MiraLAX every 3 days  Plan: 1.  Continue Nexium 40 mg twice daily and Famotidine 40 mg nightly 2.  Discussed titrating her MiraLAX again, discussed a half a dose a day or a quarter of a dose a day. 3.  Patient to follow in clinic with [place] as needed.  Ellouise Newer, PA-C [facility] 06/13/2022, 2:01 PM  Cc: Denita Lung, MD

## 2022-06-13 NOTE — Patient Instructions (Signed)
Continue your current medications.  Try titrating down your MiraLAX to a quarter or half a dose a day.  Sincerely, Ellouise Newer, PA-C

## 2022-06-14 ENCOUNTER — Encounter (HOSPITAL_COMMUNITY)
Admission: RE | Admit: 2022-06-14 | Discharge: 2022-06-14 | Disposition: A | Discharge status: Home / Self Care | Payer: Medicare Other | Source: Ambulatory Visit | Attending: Orthopaedic Surgery | Admitting: Orthopaedic Surgery

## 2022-06-14 ENCOUNTER — Ambulatory Visit (HOSPITAL_COMMUNITY)
Admission: RE | Admit: 2022-06-14 | Discharge: 2022-06-14 | Disposition: A | Discharge status: Home / Self Care | Payer: Medicare Other | Source: Ambulatory Visit | Attending: Orthopaedic Surgery | Admitting: Orthopaedic Surgery

## 2022-06-14 ENCOUNTER — Encounter (HOSPITAL_COMMUNITY): Payer: Self-pay

## 2022-06-14 ENCOUNTER — Other Ambulatory Visit: Payer: Self-pay

## 2022-06-14 VITALS — BP 154/89 | HR 83 | Temp 97.8°F | Ht 65.0 in | Wt 163.0 lb

## 2022-06-14 DIAGNOSIS — Z01812 Encounter for preprocedural laboratory examination: Secondary | ICD-10-CM | POA: Diagnosis not present

## 2022-06-14 DIAGNOSIS — Z01818 Encounter for other preprocedural examination: Secondary | ICD-10-CM

## 2022-06-14 DIAGNOSIS — R7303 Prediabetes: Secondary | ICD-10-CM | POA: Diagnosis not present

## 2022-06-14 HISTORY — DX: Unspecified osteoarthritis, unspecified site: M19.90

## 2022-06-14 HISTORY — DX: Prediabetes: R73.03

## 2022-06-14 HISTORY — DX: Pneumonia, unspecified organism: J18.9

## 2022-06-14 HISTORY — DX: Anemia, unspecified: D64.9

## 2022-06-14 LAB — BASIC METABOLIC PANEL
Anion gap: 10 (ref 5–15)
BUN: 11 mg/dL (ref 8–23)
CO2: 25 mmol/L (ref 22–32)
Calcium: 9.5 mg/dL (ref 8.9–10.3)
Chloride: 97 mmol/L — ABNORMAL LOW (ref 98–111)
Creatinine, Ser: 0.76 mg/dL (ref 0.44–1.00)
GFR, Estimated: >60 mL/min (ref >60)
Glucose, Bld: 112 mg/dL — ABNORMAL HIGH (ref 70–99)
Potassium: 3.9 mmol/L (ref 3.5–5.1)
Sodium: 132 mmol/L — ABNORMAL LOW (ref 135–145)

## 2022-06-14 LAB — CBC
HCT: 36.2 % (ref 36.0–46.0)
Hemoglobin: 12.2 g/dL (ref 12.0–15.0)
MCH: 31.9 pg (ref 26.0–34.0)
MCHC: 33.7 g/dL (ref 30.0–36.0)
MCV: 94.5 fL (ref 80.0–100.0)
Platelets: 348 10*3/uL (ref 150–400)
RBC: 3.83 MIL/uL — ABNORMAL LOW (ref 3.87–5.11)
RDW: 12.7 % (ref 11.5–15.5)
WBC: 6.5 10*3/uL (ref 4.0–10.5)
nRBC: 0 % (ref 0.0–0.2)

## 2022-06-14 LAB — GLUCOSE, CAPILLARY: Glucose-Capillary: 122 mg/dL — ABNORMAL HIGH (ref 70–99)

## 2022-06-14 LAB — HEMOGLOBIN A1C
Hgb A1c MFr Bld: 5.4 % (ref 4.8–5.6)
Mean Plasma Glucose: 108.28 mg/dL

## 2022-06-14 LAB — SURGICAL PCR SCREEN
MRSA, PCR: NEGATIVE
Staphylococcus aureus: NEGATIVE

## 2022-06-14 NOTE — Progress Notes (Signed)
Reviewed and agree with management plans for Dr. Ardis Hughs. Clinical improved. Office follow-up PRN.   Bobetta L. Tarri Glenn, MD, MPH

## 2022-06-14 NOTE — Progress Notes (Signed)
For Short Stay: [place] appointment date:  Bowel Prep reminder:   For Anesthesia: PCP - Dr. Jill Alexanders Cardiologist - Dr. Skeet Latch  Chest x-ray -  EKG - 11/02/21 Stress Test -  ECHO -  Cardiac Cath -  Pacemaker/ICD device last checked: Pacemaker orders received: Device Rep notified:  Spinal Cord Stimulator:  Sleep Study -  CPAP -   Fasting Blood Sugar - N/A Checks Blood Sugar __0___ times a day Date and result of last Hgb A1c- 5.5: 03/02/22  Last dose of GLP1 agonist-  GLP1 instructions:   Last dose of SGLT-2 inhibitors-  SGLT-2 instructions:   Blood Thinner Instructions: Aspirin Instructions: Last Dose:  Activity level: Can go up a flight of stairs and activities of daily living without stopping and without chest pain and/or shortness of breath   Able to exercise without chest pain and/or shortness of breath   Unable to go up a flight of stairs without chest pain and/or shortness of breath     Anesthesia review: Hx: Pre-DIA  Patient denies shortness of breath, fever, cough and chest pain at PAT appointment   Patient verbalized understanding of instructions that were given to them at the PAT appointment. Patient was also instructed that they will need to review over the PAT instructions again at home before surgery.

## 2022-06-16 ENCOUNTER — Encounter: Payer: Self-pay | Admitting: Nurse Practitioner

## 2022-06-16 ENCOUNTER — Ambulatory Visit: Payer: Medicare Other | Attending: Interventional Cardiology | Admitting: Nurse Practitioner

## 2022-06-16 DIAGNOSIS — Z01810 Encounter for preprocedural cardiovascular examination: Secondary | ICD-10-CM | POA: Diagnosis not present

## 2022-06-16 NOTE — Progress Notes (Signed)
Virtual Visit via Telephone Note   Because of [name]'s co-morbid illnesses, she is at least at moderate risk for complications without adequate follow up.  This format is felt to be most appropriate for this patient at this time.  The patient did not have access to video technology/had technical difficulties with video requiring transitioning to audio format only (telephone).  All issues noted in this document were discussed and addressed.  No physical exam could be performed with this format.  Please refer to the patient's chart for her consent to telehealth for [facility].  Evaluation Performed:  Preoperative cardiovascular risk assessment _____________   Date:  06/16/2022   Patient ID:  [name], DOB [birth year], MRN [id number] Patient Location:  Home Provider location:   Office  Primary Care Provider:  Denita Lung, MD Primary Cardiologist:  Skeet Latch, MD  Chief Complaint / Patient Profile   71 y.o. y/o female with a h/o HLD, exertional dyspnea for which coronary CTA was ordered and revealed normal coronaries and calcium score of 0,  hyperlipidemia, who is pending right knee replacement and presents today for telephonic preoperative cardiovascular risk assessment.  Past Medical History    Past Medical History:  Diagnosis Date   Anemia    Anxiety    Arthritis    Complication of anesthesia    past hx. laryngospasm following 2 past surgeries, not recent   Depression    Exertional dyspnea 08/04/2019   GERD (gastroesophageal reflux disease)    Hiatal hernia 10/17/2019   Hyperlipidemia    Insomnia    periodically uses med.   Perforated sigmoid colon ([place]) 04/28/2014   during colonoscopy, "rupture colon"became septic, has colostomy and open surgical wound   Pneumonia    Pre-diabetes    Prediabetes 08/09/2017   Suicide attempt ([place])    Vitamin D deficiency    Past Surgical History:  Procedure Laterality Date   ABDOMINAL  HYSTERECTOMY  4132   TAH/BSO   APPLICATION OF WOUND VAC  04/28/2014   Procedure: APPLICATION OF WOUND VAC;  Surgeon: Rolm Bookbinder, MD;  Location: [facility];  Service: General;;   BLEPHAROPLASTY  2010   BREAST SURGERY  2001   Breast reduction   COLOSTOMY N/A 04/28/2014   Procedure: COLOSTOMY;  Surgeon: Rolm Bookbinder, MD;  Location: [facility];  Service: General;  Laterality: N/A;   COLOSTOMY REVISION N/A 04/28/2014   Procedure: COLON RESECTION SIGMOID;  Surgeon: Rolm Bookbinder, MD;  Location: [facility];  Service: General;  Laterality: N/A;   COLOSTOMY TAKEDOWN N/A 09/02/2014   Procedure: LAPAROSCOPIC COLOSTOMY REVERSAL;  Surgeon: [name], MD;  Location[facility];  Service: General;  Laterality: N/A;   COSMETIC SURGERY     DEBRIDEMENT TENNIS ELBOW     DILATION AND CURETTAGE OF UTERUS     x3, hysteroscopy   ELBOW SURGERY  2007   EYE SURGERY     Blepharoplasty   LAPAROTOMY N/A 04/28/2014   Procedure: EXPLORATORY LAPAROTOMY,SIGMOID COLECTOMY;  Surgeon: Rolm Bookbinder, MD;  Location: [facility];  Service: General;  Laterality: N/A;   ORIF WRIST FRACTURE Left 02/19/2021   Procedure: Left wrist open reduction internal fixation and repair as indicated;  Surgeon: Iran Planas, MD;  Location: [facility];  Service: Orthopedics;  Laterality: Left;  63mn   REDUCTION MAMMAPLASTY Bilateral    scar and adhesion repair  06/01/15   and liposuction   ULNAR TUNNEL RELEASE Left 2010    Allergies  Allergies  Allergen Reactions  Bee Venom Swelling    Yellow jackets   Statins Other (See Comments)    Muscle pains  CRESTOR, LIPITOR    History of Present Illness    [name] is a 71 y.o. female who presents via audio/video conferencing for a telehealth visit today.  Pt was last seen in cardiology clinic on 11/02/2021 by Coletta Memos, NP.  At that time [name] was doing well.  The patient is now pending procedure as outlined above. Since her last visit, she denies chest pain, shortness of  breath, lower extremity edema, fatigue, palpitations, melena, hematuria, hemoptysis, diaphoresis, weakness, presyncope, syncope, orthopnea, and PND. Activity has been limited by knee pain but prior to intense knee pain over the past she was working out several times per week at the gym. She reports she no longer has shortness of breath.   Home Medications    Prior to Admission medications   Medication Sig Start Date End Date Taking? Authorizing Provider  esomeprazole (NEXIUM) 40 MG capsule Take 1 capsule (40 mg total) by mouth 2 (two) times daily before a meal. 05/03/22   Lemmon, Lavone Nian, PA  eszopiclone (LUNESTA) 2 MG TABS tablet Take 1 tablet by mouth daily at bedtime. 05/31/22     Ferrous Sulfate (SLOW FE PO) Take 1 tablet by mouth daily.    [provider]  gabapentin (NEURONTIN) 600 MG tablet Take 1 tablet (600 mg total) by mouth every evening. 03/21/22     ibuprofen (ADVIL,MOTRIN) 200 MG tablet Take 800 mg by mouth every 6 (six) hours as needed for mild pain or moderate pain.    [provider]  Inulin (FIBER CHOICE PO) Take 1 capsule by mouth daily.    [provider]  lamoTRIgine (LAMICTAL) 150 MG tablet Take 1 tablet by mouth once a day 01/19/22     ondansetron (ZOFRAN-ODT) 4 MG disintegrating tablet Take 1-2 tablets every 6 hours as needed 05/03/22   Levin Erp, PA  prazosin (MINIPRESS) 2 MG capsule Take 1 capsule (2 mg total) by mouth at bedtime. 04/20/22     Vitamin D, Ergocalciferol, (DRISDOL) 1.25 MG (50000 UNIT) CAPS capsule Take 1 capsule by mouth every 7 days. Patient taking differently: Take 50,000 Units by mouth every Thursday. 03/02/22   Megan Salon, MD    Physical Exam    Vital Signs:  [name] does not have vital signs available for review today.  Given telephonic nature of communication, physical exam is limited. AAOx3. NAD. Normal affect.  Speech and respirations are unlabored.  Accessory Clinical Findings     None  Assessment & Plan    1.  Preoperative Cardiovascular Risk Assessment: The patient is doing well from a cardiac perspective. Therefore, based on ACC/AHA guidelines, the patient would be at acceptable risk for the planned procedure without further cardiovascular testing. According to the Revised Cardiac Risk Index (RCRI), her Perioperative Risk of Major Cardiac Event is (%): 0.9 Her Functional Capacity in METs is: 6.05 according to the Duke Activity Status Index (DASI).  The patient was advised that if she develops new symptoms prior to surgery to contact our office to arrange for a follow-up visit, and she verbalized understanding.  No cardiac medications to hold.   A copy of this note will be routed to requesting surgeon.  Time:   Today, I have spent 10 minutes with the patient with telehealth technology discussing medical history, symptoms, and management plan.     Emmaline Life,  NP-C  06/16/2022, 2:02 PM [address] Office [phone] Fax [phone]

## 2022-06-19 ENCOUNTER — Other Ambulatory Visit: Payer: Self-pay | Admitting: Medical

## 2022-06-19 ENCOUNTER — Encounter: Payer: Self-pay | Admitting: Medical

## 2022-06-19 ENCOUNTER — Telehealth: Payer: Self-pay | Admitting: Family Medicine

## 2022-06-19 DIAGNOSIS — R911 Solitary pulmonary nodule: Secondary | ICD-10-CM

## 2022-06-19 DIAGNOSIS — R9389 Abnormal findings on diagnostic imaging of other specified body structures: Secondary | ICD-10-CM

## 2022-06-19 NOTE — Telephone Encounter (Signed)
Pt was notified of results. Pt is having knee replacement next week so she will try to see if it can be done prior to surgery

## 2022-06-19 NOTE — Progress Notes (Signed)
Chest 2 view results: Abnormal.

## 2022-06-19 NOTE — Telephone Encounter (Signed)
Pt called in, she went to her xray appointment this past Thursday as part of her pre opp before knee surgery on 06/27/22. In her xray they found nodule in her lungs and she was told her pcp would need to order testing for this. She stated all the info from the xray should be in her chart and she needs this done before her surgery on 06/27/22.

## 2022-06-20 ENCOUNTER — Other Ambulatory Visit (HOSPITAL_COMMUNITY): Payer: Self-pay

## 2022-06-21 NOTE — Care Plan (Signed)
Ortho Bundle Case Management Note  Patient Details  Name: [name] MRN: [id number] Date of Birth: May 18, 1951   spoke with patient. she will discharge to home with family and friends to assist. rolling walker ordered. HHPT referral to [facility] and [facility] set up with [facility]. discharge instructions discussed and questions answered. instructions mailed and emailed to her. question answered. Choice offered.                   DME Arranged:  [facility] Rile rolling DME Agency:  [facility]  HH Arranged:  PT HH Agency:  [facility]  Additional Comments: Please contact me with any questions of if this plan should need to change.  Ladell Heads,  [facility] Specialist  [phone] 06/21/2022, 1:51 PM

## 2022-06-26 ENCOUNTER — Ambulatory Visit
Admission: RE | Admit: 2022-06-26 | Discharge: 2022-06-26 | Disposition: A | Discharge status: Home / Self Care | Payer: Medicare Other | Source: Ambulatory Visit | Attending: Medical | Admitting: Medical

## 2022-06-26 DIAGNOSIS — R590 Localized enlarged lymph nodes: Secondary | ICD-10-CM | POA: Diagnosis not present

## 2022-06-26 DIAGNOSIS — K449 Diaphragmatic hernia without obstruction or gangrene: Secondary | ICD-10-CM | POA: Diagnosis not present

## 2022-06-26 DIAGNOSIS — I700 Atherosclerosis of aorta: Secondary | ICD-10-CM | POA: Diagnosis not present

## 2022-06-26 DIAGNOSIS — R911 Solitary pulmonary nodule: Secondary | ICD-10-CM

## 2022-06-26 DIAGNOSIS — M47814 Spondylosis without myelopathy or radiculopathy, thoracic region: Secondary | ICD-10-CM | POA: Diagnosis not present

## 2022-06-26 DIAGNOSIS — R9389 Abnormal findings on diagnostic imaging of other specified body structures: Secondary | ICD-10-CM

## 2022-06-26 MED ORDER — TRANEXAMIC ACID 1000 MG/10ML IV SOLN
2000.0000 mg | INTRAVENOUS | Status: DC
  Filled 2022-06-26: qty 20

## 2022-06-26 NOTE — H&P (Signed)
TOTAL KNEE ADMISSION H&P  Patient is being admitted for right total knee arthroplasty.  Subjective:  Chief Complaint:right knee pain.  HPI: [name], 71 y.o. female, has a history of pain and functional disability in the right knee due to arthritis and has failed non-surgical conservative treatments for greater than 12 weeks to includeNSAID's and/or analgesics, corticosteriod injections, viscosupplementation injections, flexibility and strengthening excercises, use of assistive devices, weight reduction as appropriate, and activity modification.  Onset of symptoms was gradual, starting 5 years ago with gradually worsening course since that time. The patient noted no past surgery on the right knee(s).  Patient currently rates pain in the right knee(s) at 10 out of 10 with activity. Patient has night pain, worsening of pain with activity and weight bearing, pain that interferes with activities of daily living, crepitus, and joint swelling.  Patient has evidence of subchondral cysts, subchondral sclerosis, periarticular osteophytes, and joint space narrowing by imaging studies. There is no active infection.  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 laryngospasm following 2 past surgeries, not recent   Depression    Exertional dyspnea 08/04/2019   GERD (gastroesophageal reflux disease)    Hiatal hernia 10/17/2019   Hyperlipidemia     Insomnia    periodically uses med.   Perforated sigmoid colon ([place]) 04/28/2014   during colonoscopy, "rupture colon"became septic, has colostomy and open surgical wound   Pneumonia    Pre-diabetes    Prediabetes 08/09/2017   Suicide attempt ([place])    Vitamin D deficiency     Past Surgical History:  Procedure Laterality Date   ABDOMINAL HYSTERECTOMY  6333   TAH/BSO   APPLICATION OF WOUND VAC  04/28/2014   Procedure: APPLICATION OF WOUND VAC;  Surgeon: Rolm Bookbinder, MD;  Location: [facility];  Service: General;;   BLEPHAROPLASTY  2010   BREAST SURGERY  2001   Breast reduction   COLOSTOMY N/A 04/28/2014   Procedure: COLOSTOMY;  Surgeon: Rolm Bookbinder, MD;  Location: [facility];  Service: General;  Laterality: N/A;   COLOSTOMY REVISION N/A 04/28/2014   Procedure: COLON RESECTION SIGMOID;  Surgeon: Rolm Bookbinder, MD;  Location: [facility];  Service: General;  Laterality: N/A;   COLOSTOMY TAKEDOWN N/A 09/02/2014   Procedure: LAPAROSCOPIC COLOSTOMY REVERSAL;  Surgeon: [name], MD;  Location: [facility];  Service: General;  Laterality: N/A;   COSMETIC SURGERY     DEBRIDEMENT TENNIS ELBOW     DILATION AND CURETTAGE OF UTERUS     x3, hysteroscopy   ELBOW SURGERY  2007   EYE SURGERY     Blepharoplasty   LAPAROTOMY N/A 04/28/2014   Procedure: EXPLORATORY LAPAROTOMY,SIGMOID COLECTOMY;  Surgeon: Rolm Bookbinder, MD;  Location: [facility];  Service: General;  Laterality: N/A;   ORIF WRIST FRACTURE Left 02/19/2021   Procedure: Left wrist open reduction internal fixation and repair as indicated;  Surgeon: Iran Planas, MD;  Location: [facility];  Service: Orthopedics;  Laterality: Left;  9mn   REDUCTION MAMMAPLASTY Bilateral    scar and adhesion repair  06/01/15   and liposuction   ULNAR TUNNEL RELEASE Left 2010    Current Facility-Administered Medications  Medication Dose Route Frequency Provider Last Rate Last Admin   [START ON 06/27/2022] tranexamic acid (CYKLOKAPRON) 2,000 mg in sodium chloride 0.9  % 50 mL Topical Application  24,403mg Topical To OR DMelrose Nakayama MD       Current Outpatient Medications  Medication Sig Dispense Refill Last Dose   esomeprazole (NEXIUM) 40 MG capsule Take 1 capsule (40 mg total) by mouth 2 (two) times daily before a meal. 60 capsule 11    eszopiclone (LUNESTA) 2 MG TABS tablet Take 1 tablet by mouth daily at bedtime. 30 tablet 3    Ferrous Sulfate (SLOW FE PO) Take 1 tablet by mouth daily.      gabapentin (NEURONTIN) 600 MG tablet Take 1 tablet (600 mg total) by mouth every evening. 90 tablet 3    ibuprofen (ADVIL,MOTRIN) 200 MG tablet Take 800 mg by mouth every 6 (six) hours as needed for mild pain or moderate pain.      Inulin (FIBER CHOICE PO) Take 1 capsule by mouth daily.      lamoTRIgine (LAMICTAL) 150 MG tablet Take 1 tablet by mouth once a day 30 tablet 2    ondansetron (ZOFRAN-ODT) 4 MG disintegrating tablet Take 1-2 tablets every 6 hours as needed 30 tablet 2    prazosin (MINIPRESS) 2 MG capsule Take 1 capsule (2 mg total) by mouth at bedtime. 30 capsule 3    Vitamin D, Ergocalciferol, (DRISDOL) 1.25 MG (50000 UNIT) CAPS capsule Take 1 capsule by mouth every 7 days. (Patient taking differently: Take 50,000 Units by mouth every Thursday.) 12 capsule 3    Allergies  Allergen Reactions   Bee Venom Swelling    Yellow jackets   Statins Other (See Comments)    Muscle pains  CRESTOR, LIPITOR    Social History   Tobacco Use   Smoking status: Former    Packs/day: 0.50    Types: Cigarettes    Quit date: 09/01/2003    Years since quitting: 18.8   Smokeless tobacco: Never   Tobacco comments:    occasional  Substance Use Topics   Alcohol use: Yes    Alcohol/week: 14.0 standard drinks of alcohol    Types: 14 Cans of beer per week    Comment: occas.    Family History  Problem Relation Age of Onset   Hyperlipidemia Father    Hypertension Father    Diabetes Father    Cancer Father        History of bladder cancer   Heart disease Father     Other Father        4 ft intestinal removed-had gangrene in gallbladder, then spread to intestines   Heart attack Father    Hypertension Mother    Diabetes Mother    Heart disease Maternal Grandmother    Stroke Maternal Grandfather    Hyperlipidemia Brother    Cancer Paternal Grandmother        uterine   Cancer Paternal Grandfather        pancreatic cancer   Esophageal cancer Sister      Review of Systems  Musculoskeletal:  Positive for arthralgias.       Right knee  All other systems reviewed and are negative.   Objective:  Physical Exam Constitutional:  Appearance: Normal appearance.  HENT:     Head: Normocephalic and atraumatic.     Nose: Nose normal.     Mouth/Throat:     Pharynx: Oropharynx is clear.  Eyes:     Extraocular Movements: Extraocular movements intact.  Pulmonary:     Effort: Pulmonary effort is normal.  Abdominal:     Palpations: Abdomen is soft.  Musculoskeletal:     Cervical back: Normal range of motion.     Comments: Right knee motion is 0-120.  She has medial joint line pain and crepitation but no effusion.  There are no scars.  Hip motion is full and straight leg raise is negative.  Sensation and motor function are intact distally with palpable pulses in her feet.  Skin:    General: Skin is warm and dry.  Neurological:     General: No focal deficit present.     Mental Status: She is alert and oriented to person, place, and time.  Psychiatric:        Mood and Affect: Mood normal.        Behavior: Behavior normal.        Thought Content: Thought content normal.        Judgment: Judgment normal.     Vital signs in last 24 hours:    Labs:   Estimated body mass index is 27.12 kg/m as calculated from the following:   Height as of 06/14/22: '5\' 5"'$  (1.651 m).   Weight as of 06/14/22: 73.9 kg.   Imaging Review Plain radiographs demonstrate severe degenerative joint disease of the right knee(s). The overall alignment isneutral. The  bone quality appears to be good for age and reported activity level.      Assessment/Plan:  End stage primary arthritis, right knee   The patient history, physical examination, clinical judgment of the provider and imaging studies are consistent with end stage degenerative joint disease of the right knee(s) and total knee arthroplasty is deemed medically necessary. The treatment options including medical management, injection therapy arthroscopy and arthroplasty were discussed at length. The risks and benefits of total knee arthroplasty were presented and reviewed. The risks due to aseptic loosening, infection, stiffness, patella tracking problems, thromboembolic complications and other imponderables were discussed. The patient acknowledged the explanation, agreed to proceed with the plan and consent was signed. Patient is being admitted for inpatient treatment for surgery, pain control, PT, OT, prophylactic antibiotics, VTE prophylaxis, progressive ambulation and ADL's and discharge planning. The patient is planning to be discharged home with home health services  Patient's anticipated LOS is less than 2 midnights, meeting these requirements: - Younger than 50 - Lives within 1 hour of care - Has a competent adult at home to recover with post-op recover - NO history of  - Chronic pain requiring opiods  - Diabetes  - Coronary Artery Disease  - Heart failure  - Heart attack  - Stroke  - DVT/VTE  - Cardiac arrhythmia  - Respiratory Failure/COPD  - Renal failure  - Anemia  - Advanced Liver disease

## 2022-06-26 NOTE — Anesthesia Preprocedure Evaluation (Addendum)
Anesthesia Evaluation  Patient identified by MRN, date of birth, ID band Patient awake    Reviewed: Allergy & Precautions, NPO status , Patient's Chart, lab work & pertinent test results  History of Anesthesia Complications (+) history of anesthetic complications  Airway Mallampati: II  TM Distance: >3 FB Neck ROM: Full    Dental no notable dental hx. (+) Teeth Intact, Dental Advisory Given   Pulmonary former smoker   Pulmonary exam normal breath sounds clear to auscultation       Cardiovascular Normal cardiovascular exam Rhythm:Regular Rate:Normal     Neuro/Psych  PSYCHIATRIC DISORDERS Anxiety Depression       GI/Hepatic hiatal hernia,GERD  Medicated,,  Endo/Other    Renal/GU Lab Results      Component                Value               Date                      CREATININE               0.76                06/14/2022                K                        3.9                 06/14/2022                    Musculoskeletal  (+) Arthritis ,    Abdominal   Peds  Hematology Lab Results      Component                Value               Date                        HGB                      12.2                06/14/2022                HCT                      36.2                06/14/2022                PLT                      348                 06/14/2022              Anesthesia Other Findings ALL Statins  Reproductive/Obstetrics                             Anesthesia Physical Anesthesia Plan  ASA: 2  Anesthesia Plan: Spinal   Post-op Pain Management: Regional block*   Induction:   PONV Risk Score and Plan: 3 and Treatment may vary due to age or medical  condition, Midazolam and Ondansetron  Airway Management Planned: Nasal Cannula and Natural Airway  Additional Equipment: None  Intra-op Plan:   Post-operative Plan:   Informed Consent: I have reviewed the patients History  and Physical, chart, labs and discussed the procedure including the risks, benefits and alternatives for the proposed anesthesia with the patient or authorized representative who has indicated his/her understanding and acceptance.     Dental advisory given  Plan Discussed with: CRNA  Anesthesia Plan Comments: (SP w R adductor canal)        Anesthesia Quick Evaluation

## 2022-06-27 ENCOUNTER — Other Ambulatory Visit: Payer: Self-pay

## 2022-06-27 ENCOUNTER — Ambulatory Visit (HOSPITAL_BASED_OUTPATIENT_CLINIC_OR_DEPARTMENT_OTHER): Payer: Medicare Other | Admitting: Anesthesiology

## 2022-06-27 ENCOUNTER — Ambulatory Visit (HOSPITAL_COMMUNITY): Payer: Medicare Other | Admitting: Anesthesiology

## 2022-06-27 ENCOUNTER — Other Ambulatory Visit (HOSPITAL_COMMUNITY): Payer: Self-pay

## 2022-06-27 ENCOUNTER — Other Ambulatory Visit (HOSPITAL_BASED_OUTPATIENT_CLINIC_OR_DEPARTMENT_OTHER): Payer: Self-pay

## 2022-06-27 ENCOUNTER — Encounter (HOSPITAL_COMMUNITY)
Admission: RE | Disposition: A | Discharge status: Home / Self Care | Payer: Self-pay | Source: Ambulatory Visit | Attending: Orthopaedic Surgery

## 2022-06-27 ENCOUNTER — Encounter (HOSPITAL_COMMUNITY): Payer: Self-pay | Admitting: Orthopaedic Surgery

## 2022-06-27 ENCOUNTER — Observation Stay (HOSPITAL_COMMUNITY)
Admission: RE | Admit: 2022-06-27 | Discharge: 2022-06-28 | Disposition: A | Discharge status: Home / Self Care | Payer: Medicare Other | Source: Ambulatory Visit | Attending: Orthopaedic Surgery | Admitting: Orthopaedic Surgery

## 2022-06-27 DIAGNOSIS — Z79899 Other long term (current) drug therapy: Secondary | ICD-10-CM | POA: Diagnosis not present

## 2022-06-27 DIAGNOSIS — G8918 Other acute postprocedural pain: Secondary | ICD-10-CM | POA: Diagnosis not present

## 2022-06-27 DIAGNOSIS — M1711 Unilateral primary osteoarthritis, right knee: Principal | ICD-10-CM | POA: Insufficient documentation

## 2022-06-27 DIAGNOSIS — Z96651 Presence of right artificial knee joint: Secondary | ICD-10-CM

## 2022-06-27 DIAGNOSIS — Z87891 Personal history of nicotine dependence: Secondary | ICD-10-CM | POA: Diagnosis not present

## 2022-06-27 HISTORY — PX: TOTAL KNEE ARTHROPLASTY: SHX125

## 2022-06-27 SURGERY — ARTHROPLASTY, KNEE, TOTAL
Anesthesia: Spinal | Site: Knee | Laterality: Right

## 2022-06-27 MED ORDER — BUPIVACAINE-EPINEPHRINE 0.5% -1:200000 IJ SOLN
INTRAMUSCULAR | Status: DC | PRN
  Administered 2022-06-27: 30 mL

## 2022-06-27 MED ORDER — KETOROLAC TROMETHAMINE 15 MG/ML IJ SOLN
7.5000 mg | Freq: Four times a day (QID) | INTRAMUSCULAR | Status: DC
  Administered 2022-06-27 – 2022-06-28 (×3): 7.5 mg via INTRAVENOUS
  Filled 2022-06-27 (×3): qty 1

## 2022-06-27 MED ORDER — BUPIVACAINE LIPOSOME 1.3 % IJ SUSP: 20.0000 mL | Freq: Once | INTRAMUSCULAR | Status: DC

## 2022-06-27 MED ORDER — FENTANYL CITRATE (PF) 100 MCG/2ML IJ SOLN
INTRAMUSCULAR | Status: DC | PRN
  Administered 2022-06-27: 100 ug via INTRAVENOUS

## 2022-06-27 MED ORDER — ACETAMINOPHEN 500 MG PO TABS
500.0000 mg | ORAL_TABLET | Freq: Four times a day (QID) | ORAL | Status: DC
  Administered 2022-06-27 – 2022-06-28 (×2): 500 mg via ORAL
  Filled 2022-06-27: qty 1

## 2022-06-27 MED ORDER — PHENOL 1.4 % MT LIQD: 1.0000 | OROMUCOSAL | Status: DC | PRN

## 2022-06-27 MED ORDER — BUPIVACAINE IN DEXTROSE 0.75-8.25 % IT SOLN
INTRATHECAL | Status: DC | PRN
  Administered 2022-06-27: 12 mg via INTRATHECAL

## 2022-06-27 MED ORDER — MENTHOL 3 MG MT LOZG: 1.0000 | LOZENGE | OROMUCOSAL | Status: DC | PRN

## 2022-06-27 MED ORDER — ONDANSETRON HCL 4 MG/2ML IJ SOLN
INTRAMUSCULAR | Status: AC
  Filled 2022-06-27: qty 2

## 2022-06-27 MED ORDER — GABAPENTIN 300 MG PO CAPS
600.0000 mg | ORAL_CAPSULE | Freq: Every evening | ORAL | Status: DC
  Administered 2022-06-27: 600 mg via ORAL
  Filled 2022-06-27 (×2): qty 2

## 2022-06-27 MED ORDER — LACTATED RINGERS IV SOLN: INTRAVENOUS | Status: DC

## 2022-06-27 MED ORDER — HYDROCODONE-ACETAMINOPHEN 5-325 MG PO TABS
ORAL_TABLET | ORAL | Status: AC
  Administered 2022-06-27: 2 via ORAL
  Filled 2022-06-27: qty 2

## 2022-06-27 MED ORDER — BUPIVACAINE LIPOSOME 1.3 % IJ SUSP
INTRAMUSCULAR | Status: AC
  Filled 2022-06-27: qty 20

## 2022-06-27 MED ORDER — DEXAMETHASONE SODIUM PHOSPHATE 10 MG/ML IJ SOLN
INTRAMUSCULAR | Status: DC | PRN
  Administered 2022-06-27: 8 mg via INTRAVENOUS

## 2022-06-27 MED ORDER — ONDANSETRON HCL 4 MG/2ML IJ SOLN: 4.0000 mg | Freq: Four times a day (QID) | INTRAMUSCULAR | Status: DC | PRN

## 2022-06-27 MED ORDER — PHENYLEPHRINE HCL-NACL 20-0.9 MG/250ML-% IV SOLN
INTRAVENOUS | Status: AC
  Filled 2022-06-27: qty 250

## 2022-06-27 MED ORDER — TIZANIDINE HCL 4 MG PO TABS
4.0000 mg | ORAL_TABLET | Freq: Four times a day (QID) | ORAL | 1 refills | Status: DC | PRN
  Filled 2022-06-27 (×2): qty 40, 10d supply, fill #0
  Filled 2022-07-03 – 2022-07-05 (×2): qty 40, 10d supply, fill #1

## 2022-06-27 MED ORDER — PROPOFOL 500 MG/50ML IV EMUL
INTRAVENOUS | Status: DC | PRN
  Administered 2022-06-27: 100 ug/kg/min via INTRAVENOUS

## 2022-06-27 MED ORDER — ACETAMINOPHEN 10 MG/ML IV SOLN: 1000.0000 mg | Freq: Once | INTRAVENOUS | Status: DC | PRN

## 2022-06-27 MED ORDER — HYDROCODONE-ACETAMINOPHEN 5-325 MG PO TABS
1.0000 | ORAL_TABLET | Freq: Four times a day (QID) | ORAL | 0 refills | Status: DC | PRN
  Filled 2022-06-27 (×2): qty 40, 5d supply, fill #0

## 2022-06-27 MED ORDER — 0.9 % SODIUM CHLORIDE (POUR BTL) OPTIME
TOPICAL | Status: DC | PRN
  Administered 2022-06-27: 1000 mL

## 2022-06-27 MED ORDER — SODIUM CHLORIDE (PF) 0.9 % IJ SOLN
INTRAMUSCULAR | Status: DC | PRN
  Administered 2022-06-27: 30 mL

## 2022-06-27 MED ORDER — PROPOFOL 10 MG/ML IV BOLUS
INTRAVENOUS | Status: DC | PRN
  Administered 2022-06-27: 20 mg via INTRAVENOUS

## 2022-06-27 MED ORDER — MIDAZOLAM HCL 5 MG/5ML IJ SOLN
INTRAMUSCULAR | Status: DC | PRN
  Administered 2022-06-27: 2 mg via INTRAVENOUS

## 2022-06-27 MED ORDER — SODIUM CHLORIDE 0.9 % IR SOLN
Status: DC | PRN
  Administered 2022-06-27: 3000 mL

## 2022-06-27 MED ORDER — TRANEXAMIC ACID-NACL 1000-0.7 MG/100ML-% IV SOLN
INTRAVENOUS | Status: AC
  Administered 2022-06-27: 1000 mg via INTRAVENOUS
  Filled 2022-06-27: qty 100

## 2022-06-27 MED ORDER — SODIUM CHLORIDE (PF) 0.9 % IJ SOLN
INTRAMUSCULAR | Status: AC
  Filled 2022-06-27: qty 30

## 2022-06-27 MED ORDER — HYDROCODONE-ACETAMINOPHEN 5-325 MG PO TABS
1.0000 | ORAL_TABLET | ORAL | Status: DC | PRN
  Administered 2022-06-27 – 2022-06-28 (×2): 2 via ORAL
  Filled 2022-06-27 (×2): qty 2

## 2022-06-27 MED ORDER — HYDROCODONE-ACETAMINOPHEN 7.5-325 MG PO TABS
1.0000 | ORAL_TABLET | ORAL | Status: DC | PRN
  Administered 2022-06-28: 2 via ORAL
  Filled 2022-06-27: qty 2

## 2022-06-27 MED ORDER — ONDANSETRON HCL 4 MG PO TABS: 4.0000 mg | ORAL_TABLET | Freq: Four times a day (QID) | ORAL | Status: DC | PRN

## 2022-06-27 MED ORDER — BUPIVACAINE LIPOSOME 1.3 % IJ SUSP
INTRAMUSCULAR | Status: DC | PRN
  Administered 2022-06-27: 20 mL

## 2022-06-27 MED ORDER — FENTANYL CITRATE (PF) 100 MCG/2ML IJ SOLN
INTRAMUSCULAR | Status: AC
  Filled 2022-06-27: qty 2

## 2022-06-27 MED ORDER — POVIDONE-IODINE 10 % EX SWAB: 2.0000 | Freq: Once | CUTANEOUS | Status: DC

## 2022-06-27 MED ORDER — ALUM & MAG HYDROXIDE-SIMETH 200-200-20 MG/5ML PO SUSP
30.0000 mL | ORAL | Status: DC | PRN
  Administered 2022-06-27: 30 mL via ORAL
  Filled 2022-06-27: qty 30

## 2022-06-27 MED ORDER — ASPIRIN 81 MG PO CHEW
81.0000 mg | CHEWABLE_TABLET | Freq: Two times a day (BID) | ORAL | Status: DC
  Administered 2022-06-28: 81 mg via ORAL
  Filled 2022-06-27: qty 1

## 2022-06-27 MED ORDER — PANTOPRAZOLE SODIUM 40 MG PO TBEC
80.0000 mg | DELAYED_RELEASE_TABLET | Freq: Every day | ORAL | Status: DC
  Administered 2022-06-27: 80 mg via ORAL
  Filled 2022-06-27: qty 2

## 2022-06-27 MED ORDER — LACTATED RINGERS IV BOLUS
250.0000 mL | Freq: Once | INTRAVENOUS | Status: AC
  Administered 2022-06-27: 250 mL via INTRAVENOUS

## 2022-06-27 MED ORDER — ONDANSETRON HCL 4 MG/2ML IJ SOLN
INTRAMUSCULAR | Status: DC | PRN
  Administered 2022-06-27: 4 mg via INTRAVENOUS

## 2022-06-27 MED ORDER — ZOLPIDEM TARTRATE 5 MG PO TABS
5.0000 mg | ORAL_TABLET | Freq: Every evening | ORAL | Status: DC | PRN
  Filled 2022-06-27: qty 1

## 2022-06-27 MED ORDER — DOCUSATE SODIUM 100 MG PO CAPS
100.0000 mg | ORAL_CAPSULE | Freq: Two times a day (BID) | ORAL | Status: DC
  Administered 2022-06-27 – 2022-06-28 (×2): 100 mg via ORAL
  Filled 2022-06-27 (×2): qty 1

## 2022-06-27 MED ORDER — ROPIVACAINE HCL 5 MG/ML IJ SOLN
INTRAMUSCULAR | Status: DC | PRN
  Administered 2022-06-27: 30 mL via PERINEURAL

## 2022-06-27 MED ORDER — MIDAZOLAM HCL 2 MG/2ML IJ SOLN
INTRAMUSCULAR | Status: AC
  Filled 2022-06-27: qty 2

## 2022-06-27 MED ORDER — CLONIDINE HCL (ANALGESIA) 100 MCG/ML EP SOLN
EPIDURAL | Status: DC | PRN
  Administered 2022-06-27: 100 ug

## 2022-06-27 MED ORDER — CHLORHEXIDINE GLUCONATE 0.12 % MT SOLN
15.0000 mL | Freq: Once | OROMUCOSAL | Status: AC
  Administered 2022-06-27: 15 mL via OROMUCOSAL

## 2022-06-27 MED ORDER — METOCLOPRAMIDE HCL 5 MG/ML IJ SOLN: 5.0000 mg | Freq: Three times a day (TID) | INTRAMUSCULAR | Status: DC | PRN

## 2022-06-27 MED ORDER — TRANEXAMIC ACID-NACL 1000-0.7 MG/100ML-% IV SOLN
1000.0000 mg | INTRAVENOUS | Status: AC
  Administered 2022-06-27: 1000 mg via INTRAVENOUS
  Filled 2022-06-27: qty 100

## 2022-06-27 MED ORDER — ASPIRIN 81 MG PO TBEC
81.0000 mg | DELAYED_RELEASE_TABLET | Freq: Two times a day (BID) | ORAL | 0 refills | Status: DC
  Filled 2022-06-27: qty 45, 23d supply, fill #0
  Filled 2022-06-27: qty 45, 28d supply, fill #0

## 2022-06-27 MED ORDER — STERILE WATER FOR IRRIGATION IR SOLN
Status: DC | PRN
  Administered 2022-06-27: 2000 mL

## 2022-06-27 MED ORDER — BUPIVACAINE-EPINEPHRINE (PF) 0.5% -1:200000 IJ SOLN
INTRAMUSCULAR | Status: AC
  Filled 2022-06-27: qty 30

## 2022-06-27 MED ORDER — HYDROCODONE-ACETAMINOPHEN 5-325 MG PO TABS
1.0000 | ORAL_TABLET | Freq: Four times a day (QID) | ORAL | 0 refills | Status: DC | PRN
  Filled 2022-06-27: qty 40, 5d supply, fill #0

## 2022-06-27 MED ORDER — LACTATED RINGERS IV BOLUS
500.0000 mL | Freq: Once | INTRAVENOUS | Status: AC
  Administered 2022-06-27: 500 mL via INTRAVENOUS

## 2022-06-27 MED ORDER — TRANEXAMIC ACID 1000 MG/10ML IV SOLN
INTRAVENOUS | Status: DC | PRN
  Administered 2022-06-27: 2000 mg via TOPICAL

## 2022-06-27 MED ORDER — FENTANYL CITRATE PF 50 MCG/ML IJ SOSY: 25.0000 ug | PREFILLED_SYRINGE | INTRAMUSCULAR | Status: DC | PRN

## 2022-06-27 MED ORDER — PROPOFOL 1000 MG/100ML IV EMUL
INTRAVENOUS | Status: AC
  Filled 2022-06-27: qty 100

## 2022-06-27 MED ORDER — METOCLOPRAMIDE HCL 5 MG PO TABS: 5.0000 mg | ORAL_TABLET | Freq: Three times a day (TID) | ORAL | Status: DC | PRN

## 2022-06-27 MED ORDER — ACETAMINOPHEN 325 MG PO TABS: 325.0000 mg | ORAL_TABLET | Freq: Four times a day (QID) | ORAL | Status: DC | PRN

## 2022-06-27 MED ORDER — LAMOTRIGINE 25 MG PO TABS
150.0000 mg | ORAL_TABLET | Freq: Every day | ORAL | Status: DC
  Administered 2022-06-28: 150 mg via ORAL
  Filled 2022-06-27 (×3): qty 2

## 2022-06-27 MED ORDER — ONDANSETRON HCL 4 MG/2ML IJ SOLN: 4.0000 mg | Freq: Once | INTRAMUSCULAR | Status: DC | PRN

## 2022-06-27 MED ORDER — METHOCARBAMOL 500 MG PO TABS
500.0000 mg | ORAL_TABLET | Freq: Four times a day (QID) | ORAL | Status: DC | PRN
  Administered 2022-06-27 – 2022-06-28 (×2): 500 mg via ORAL
  Filled 2022-06-27: qty 1

## 2022-06-27 MED ORDER — BISACODYL 5 MG PO TBEC: 5.0000 mg | DELAYED_RELEASE_TABLET | Freq: Every day | ORAL | Status: DC | PRN

## 2022-06-27 MED ORDER — CEFAZOLIN SODIUM-DEXTROSE 2-4 GM/100ML-% IV SOLN
2.0000 g | INTRAVENOUS | Status: AC
  Administered 2022-06-27: 2 g via INTRAVENOUS
  Filled 2022-06-27: qty 100

## 2022-06-27 MED ORDER — METHOCARBAMOL 500 MG PO TABS
ORAL_TABLET | ORAL | Status: AC
  Filled 2022-06-27: qty 1

## 2022-06-27 MED ORDER — PHENYLEPHRINE HCL-NACL 20-0.9 MG/250ML-% IV SOLN
INTRAVENOUS | Status: DC | PRN
  Administered 2022-06-27: 50 ug/min via INTRAVENOUS

## 2022-06-27 MED ORDER — MORPHINE SULFATE (PF) 2 MG/ML IV SOLN: 0.5000 mg | INTRAVENOUS | Status: DC | PRN

## 2022-06-27 MED ORDER — ORAL CARE MOUTH RINSE: 15.0000 mL | Freq: Once | OROMUCOSAL | Status: AC

## 2022-06-27 MED ORDER — DEXAMETHASONE SODIUM PHOSPHATE 10 MG/ML IJ SOLN
INTRAMUSCULAR | Status: AC
  Filled 2022-06-27: qty 1

## 2022-06-27 MED ORDER — TRANEXAMIC ACID-NACL 1000-0.7 MG/100ML-% IV SOLN: 1000.0000 mg | Freq: Once | INTRAVENOUS | Status: AC

## 2022-06-27 MED ORDER — PRAZOSIN HCL 1 MG PO CAPS
2.0000 mg | ORAL_CAPSULE | Freq: Every day | ORAL | Status: DC
  Administered 2022-06-27: 2 mg via ORAL
  Filled 2022-06-27: qty 2

## 2022-06-27 MED ORDER — METHOCARBAMOL 500 MG IVPB - SIMPLE MED: 500.0000 mg | Freq: Four times a day (QID) | INTRAVENOUS | Status: DC | PRN

## 2022-06-27 MED ORDER — CEFAZOLIN SODIUM-DEXTROSE 2-4 GM/100ML-% IV SOLN
INTRAVENOUS | Status: AC
  Administered 2022-06-27: 2 g via INTRAVENOUS
  Filled 2022-06-27: qty 100

## 2022-06-27 MED ORDER — DIPHENHYDRAMINE HCL 12.5 MG/5ML PO ELIX
12.5000 mg | ORAL_SOLUTION | ORAL | Status: DC | PRN
  Administered 2022-06-27: 25 mg via ORAL
  Filled 2022-06-27: qty 10

## 2022-06-27 MED ORDER — CEFAZOLIN SODIUM-DEXTROSE 2-4 GM/100ML-% IV SOLN
2.0000 g | Freq: Four times a day (QID) | INTRAVENOUS | Status: AC
  Administered 2022-06-27: 2 g via INTRAVENOUS
  Filled 2022-06-27: qty 100

## 2022-06-27 MED ORDER — ACETAMINOPHEN 500 MG PO TABS
ORAL_TABLET | ORAL | Status: AC
  Filled 2022-06-27: qty 1

## 2022-06-27 SURGICAL SUPPLY — 55 items
ATTUNE PS FEM RT SZ 5 CEM KNEE (Femur) IMPLANT
ATTUNE PSRP INSR SZ5 6 KNEE (Insert) IMPLANT
BAG COUNTER SPONGE SURGICOUNT (BAG) ×1 IMPLANT
BAG DECANTER FOR FLEXI CONT (MISCELLANEOUS) ×1 IMPLANT
BAG SPEC THK2 15X12 ZIP CLS (MISCELLANEOUS) ×1
BAG SPNG CNTER NS LX DISP (BAG) ×1
BAG ZIPLOCK 12X15 (MISCELLANEOUS) ×1 IMPLANT
BASE TIBIA ATTUNE KNEE SYS SZ6 (Knees) IMPLANT
BLADE SAGITTAL 25.0X1.19X90 (BLADE) ×1 IMPLANT
BLADE SAW SGTL 11.0X1.19X90.0M (BLADE) ×1 IMPLANT
BLADE SURG SZ10 CARB STEEL (BLADE) ×1 IMPLANT
BNDG ELASTIC 6X5.8 VLCR STR LF (GAUZE/BANDAGES/DRESSINGS) ×1 IMPLANT
BOOTIES KNEE HIGH SLOAN (MISCELLANEOUS) ×1 IMPLANT
BOWL SMART MIX CTS (DISPOSABLE) ×1 IMPLANT
BSPLAT TIB 6 CMNT ROT PLAT STR (Knees) ×1 IMPLANT
CEMENT HV SMART SET (Cement) ×2 IMPLANT
COVER SURGICAL LIGHT HANDLE (MISCELLANEOUS) ×1 IMPLANT
CUFF TOURN SGL QUICK 34 (TOURNIQUET CUFF) ×1
CUFF TRNQT CYL 34X4.125X (TOURNIQUET CUFF) ×1 IMPLANT
DRAPE INCISE IOBAN 66X45 STRL (DRAPES) ×1 IMPLANT
DRAPE SHEET LG 3/4 BI-LAMINATE (DRAPES) ×1 IMPLANT
DRAPE TOP 10253 STERILE (DRAPES) ×1 IMPLANT
DRAPE U-SHAPE 47X51 STRL (DRAPES) ×1 IMPLANT
DRSG AQUACEL AG ADV 3.5X10 (GAUZE/BANDAGES/DRESSINGS) ×1 IMPLANT
DURAPREP 26ML APPLICATOR (WOUND CARE) ×2 IMPLANT
ELECT REM PT RETURN 15FT ADLT (MISCELLANEOUS) ×1 IMPLANT
GLOVE BIO SURGEON STRL SZ8 (GLOVE) ×2 IMPLANT
GLOVE BIOGEL PI IND STRL 8 (GLOVE) ×2 IMPLANT
GOWN STRL REUS W/ TWL XL LVL3 (GOWN DISPOSABLE) ×2 IMPLANT
GOWN STRL REUS W/TWL XL LVL3 (GOWN DISPOSABLE) ×2
HANDPIECE INTERPULSE COAX TIP (DISPOSABLE) ×1
HOLDER FOLEY CATH W/STRAP (MISCELLANEOUS) IMPLANT
HOOD PEEL AWAY T7 (MISCELLANEOUS) ×3 IMPLANT
KIT TURNOVER KIT A (KITS) IMPLANT
MANIFOLD NEPTUNE II (INSTRUMENTS) ×1 IMPLANT
NEEDLE HYPO 22GX1.5 SAFETY (NEEDLE) ×1 IMPLANT
NS IRRIG 1000ML POUR BTL (IV SOLUTION) ×1 IMPLANT
PACK TOTAL KNEE CUSTOM (KITS) ×1 IMPLANT
PAD ARMBOARD 7.5X6 YLW CONV (MISCELLANEOUS) ×1 IMPLANT
PATELLA MEDIAL ATTUN 35MM KNEE (Knees) IMPLANT
PIN STEINMAN FIXATION KNEE (PIN) IMPLANT
PROTECTOR NERVE ULNAR (MISCELLANEOUS) ×1 IMPLANT
SET HNDPC FAN SPRY TIP SCT (DISPOSABLE) ×1 IMPLANT
SPIKE FLUID TRANSFER (MISCELLANEOUS) ×2 IMPLANT
SUT ETHIBOND NAB CT1 #1 30IN (SUTURE) ×2 IMPLANT
SUT VIC AB 0 CT1 36 (SUTURE) ×1 IMPLANT
SUT VIC AB 2-0 CT1 27 (SUTURE) ×1
SUT VIC AB 2-0 CT1 TAPERPNT 27 (SUTURE) ×1 IMPLANT
SUT VICRYL AB 3-0 FS1 BRD 27IN (SUTURE) ×1 IMPLANT
SUT VLOC 180 0 24IN GS25 (SUTURE) ×1 IMPLANT
TIBIA ATTUNE KNEE SYS BASE SZ6 (Knees) ×1 IMPLANT
TRAY FOLEY W/BAG SLVR 14FR LF (SET/KITS/TRAYS/PACK) IMPLANT
WATER STERILE IRR 1000ML POUR (IV SOLUTION) ×1 IMPLANT
WRAP KNEE MAXI GEL POST OP (GAUZE/BANDAGES/DRESSINGS) ×1 IMPLANT
YANKAUER SUCT BULB TIP NO VENT (SUCTIONS) ×1 IMPLANT

## 2022-06-27 NOTE — Evaluation (Signed)
Physical Therapy Evaluation Patient Details Name: [name] MRN: [id number] DOB: 09/27/1950 Today's Date: 06/27/2022  History of Present Illness  Pt is a 71yo female presenting s/p R-TKA on 06/27/22. PMH: anemia, anxiety & depression, GERD, HLD, PNA, DM.  Clinical Impression  [name] is a 71 y.o. female POD 0 s/p R-TKA. Patient reports independence with mobility at baseline. Patient is now limited by functional impairments (see PT problem list below) and upon evaluation was not able to perform RLE straight leg raise without extensor lag, suspect secondary to spinal anesthesia, further mobility deferred. Patient instructed in exercises to facilitate ROM and circulation. Patient will benefit from continued skilled PT interventions to address impairments and progress towards PLOF. We will provide second session as schedule and pt status allows to attempt same day discharge home in the care of her grandson; should the pt remain in the hospital we will continue to follow acutely.     Recommendations for follow up therapy are one component of a multi-disciplinary discharge planning process, led by the attending physician.  Recommendations may be updated based on patient status, additional functional criteria and insurance authorization.  Follow Up Recommendations Follow physician's recommendations for discharge plan and follow up therapies      Assistance Recommended at Discharge Frequent or constant Supervision/Assistance  Patient can return home with the following  A little help with walking and/or transfers;A little help with bathing/dressing/bathroom;Assistance with cooking/housework;Assist for transportation;Help with stairs or ramp for entrance    Equipment Recommendations Rolling walker (2 wheels)  Recommendations for Other Services       Functional Status Assessment Patient has had a recent decline in their functional status and demonstrates the ability to make  significant improvements in function in a reasonable and predictable amount of time.     Precautions / Restrictions Precautions Precautions: Fall;Knee Precaution Booklet Issued: No Precaution Comments: Recent fall; no pillow under the knee Restrictions Weight Bearing Restrictions: No Other Position/Activity Restrictions: wbat      Mobility  Bed Mobility               General bed mobility comments: deferred    Transfers                   General transfer comment: deferred    Ambulation/Gait               General Gait Details: deferred  Stairs            Wheelchair Mobility    Modified Rankin (Stroke Patients Only)       Balance                                             Pertinent Vitals/Pain Pain Assessment Pain Assessment: 0-10 Pain Score: 4  Pain Location: right knee Pain Descriptors / Indicators: Aching, Operative site guarding Pain Intervention(s): Limited activity within patient's tolerance, Monitored during session, Repositioned, Ice applied    Home Living Family/patient expects to be discharged to:: Private residence Living Arrangements: Alone Available Help at Discharge: Family;Available 24 hours/day Type of Home: Apartment Home Access: Stairs to enter;Elevator Entrance Stairs-Rails: None Entrance Stairs-Number of Steps: 2 Alternate Level Stairs-Number of Steps: 20 Home Layout: Two level;Bed/bath upstairs;Able to live on main level with bedroom/bathroom Home Equipment: Shower seat;Tub bench;Cane - single point;Toilet riser      Prior Function Prior Level  of Function : Independent/Modified Independent;Driving;History of Falls (last six months)             Mobility Comments: Fall in August: tripped over a chair and hit my head. IND ADLs Comments: IND     Hand Dominance        Extremity/Trunk Assessment   Upper Extremity Assessment Upper Extremity Assessment: Overall WFL for tasks  assessed    Lower Extremity Assessment Lower Extremity Assessment: RLE deficits/detail;LLE deficits/detail RLE Deficits / Details: MMT ank DF/PF 5/5, pt unable to perform SLR RLE Sensation: WNL LLE Deficits / Details: MMT ank DF/PF 5/5 LLE Sensation: WNL    Cervical / Trunk Assessment Cervical / Trunk Assessment: Normal  Communication   Communication: No difficulties  Cognition Arousal/Alertness: Awake/alert Behavior During Therapy: WFL for tasks assessed/performed Overall Cognitive Status: Within Functional Limits for tasks assessed                                          General Comments General comments (skin integrity, edema, etc.): Ferrel Logan present    Exercises     Assessment/Plan    PT Assessment Patient needs continued PT services  PT Problem List Decreased strength;Decreased range of motion;Decreased activity tolerance;Decreased balance;Decreased mobility;Decreased coordination;Pain       PT Treatment Interventions DME instruction;Gait training;Stair training;Functional mobility training;Therapeutic activities;Therapeutic exercise;Balance training;Neuromuscular re-education;Patient/family education    PT Goals (Current goals can be found in the Care Plan section)  Acute Rehab PT Goals Patient Stated Goal: Walking in the park PT Goal Formulation: With patient Time For Goal Achievement: 07/04/22 Potential to Achieve Goals: Good    Frequency 7X/week     Co-evaluation               AM-PAC PT "6 Clicks" Mobility  Outcome Measure Help needed turning from your back to your side while in a flat bed without using bedrails?: None Help needed moving from lying on your back to sitting on the side of a flat bed without using bedrails?: A Little Help needed moving to and from a bed to a chair (including a wheelchair)?: A Lot Help needed standing up from a chair using your arms (e.g., wheelchair or bedside chair)?: A Lot Help needed to  walk in hospital room?: A Lot Help needed climbing 3-5 steps with a railing? : Total 6 Click Score: 14    End of Session   Activity Tolerance: Other (comment) (Limited by presence of spinal anesthesia) Patient left: in bed;with call bell/phone within reach;with family/visitor present;with nursing/sitter in room Nurse Communication: Mobility status PT Visit Diagnosis: Pain;Difficulty in walking, not elsewhere classified (R26.2) Pain - Right/Left: Right Pain - part of body: Knee    Time: 1241-1306 PT Time Calculation (min) (ACUTE ONLY): 25 min   Charges:   PT Evaluation $PT Eval Low Complexity: Geraldine, PT, DPT [facility] Office: [phone] Weekend pager: [phone]  Coolidge Breeze 06/27/2022, 1:20 PM

## 2022-06-27 NOTE — Interval H&P Note (Signed)
History and Physical Interval Note:  06/27/2022 7:28 AM  [name]  has presented today for surgery, with the diagnosis of RIGHT KNEE DEGENERATIVE JOINT DISEASE.  The various methods of treatment have been discussed with the patient and family. After consideration of risks, benefits and other options for treatment, the patient has consented to  Procedure(s): RIGHT TOTAL KNEE ARTHROPLASTY (Right) as a surgical intervention.  The patient's history has been reviewed, patient examined, no change in status, stable for surgery.  I have reviewed the patient's chart and labs.  Questions were answered to the patient's satisfaction.     Hessie Dibble

## 2022-06-27 NOTE — Anesthesia Procedure Notes (Addendum)
Spinal  Patient location during procedure: OR Start time: 06/27/2022 7:31 AM End time: 06/27/2022 7:36 AM Reason for block: surgical anesthesia Staffing Performed: anesthesiologist  Anesthesiologist: Barnet Glasgow, MD Performed by: Barnet Glasgow, MD Authorized by: Barnet Glasgow, MD   Preanesthetic Checklist Completed: patient identified, IV checked, risks and benefits discussed, surgical consent, monitors and equipment checked, pre-op evaluation and timeout performed Spinal Block Patient position: sitting Prep: DuraPrep and site prepped and draped Patient monitoring: heart rate, cardiac monitor, continuous pulse ox and blood pressure Approach: midline Location: L3-4 Injection technique: single-shot Needle Needle type: Pencan  Needle gauge: 24 G Needle length: 10 cm Needle insertion depth: 6 cm Assessment Sensory level: T4 Events: CSF return Additional Notes  1 Attempt (s). Pt tolerated procedure well.

## 2022-06-27 NOTE — Anesthesia Procedure Notes (Signed)
Anesthesia Regional Block: Adductor canal block   Pre-Anesthetic Checklist: , timeout performed,  Correct Patient, Correct Site, Correct Laterality,  Correct Procedure, Correct Position, site marked,  Risks and benefits discussed,  Surgical consent,  Pre-op evaluation,  At surgeon's request and post-op pain management  Laterality: Lower and Right  Prep: chloraprep       Needles:  Injection technique: Single-shot  Needle Type: Echogenic Needle     Needle Length: 9cm  Needle Gauge: 22     Additional Needles:   Procedures:,,,, ultrasound used (permanent image in chart),,    Narrative:  Start time: 06/27/2022 7:04 AM End time: 06/27/2022 7:12 AM Injection made incrementally with aspirations every 5 mL.  Performed by: Personally  Anesthesiologist: Barnet Glasgow, MD  Additional Notes: Block assessed prior to surgery. Pt tolerated procedure well.

## 2022-06-27 NOTE — Transfer of Care (Signed)
Immediate Anesthesia Transfer of Care Note  Patient: SIAN ROCKERS  Procedure(s) Performed: RIGHT TOTAL KNEE ARTHROPLASTY (Right: Knee)  Patient Location: PACU  Anesthesia Type:Spinal  Level of Consciousness: sedated  Airway & Oxygen Therapy: Patient Spontanous Breathing and Patient connected to face mask oxygen  Post-op Assessment: Report given to RN and Post -op Vital signs reviewed and stable  Post vital signs: Reviewed and stable  Last Vitals:  Vitals Value Taken Time  BP    Temp    Pulse 93 06/27/22 0925  Resp    SpO2 97 % 06/27/22 0925  Vitals shown include unvalidated device data.  Last Pain:  Vitals:   06/27/22 0552  TempSrc:   PainSc: 0-No pain         Complications: No notable events documented.

## 2022-06-27 NOTE — Plan of Care (Signed)
  Problem: Pain Management: Goal: Pain level will decrease with appropriate interventions Outcome: Progressing   

## 2022-06-27 NOTE — Progress Notes (Addendum)
Physical Therapy Treatment Patient Details Name: [name] MRN: [id number] DOB: Jan 31, 1951 Today's Date: 06/27/2022   History of Present Illness Pt is a 71yo female presenting s/p R-TKA on 06/27/22. PMH: anemia, anxiety & depression, GERD, HLD, PNA, DM.    PT Comments    Pt seen POD0 for second session to attempt same-day discharge in PACU; R knee extensor lag still present so applied R knee immobilizer while supine in bed. Pt requires supervision for bed mobility and min guard for transfers. Patient was able to ambulate 100 feet with RW and min guard level of assist; pt had several instance of R knee buckling despite being in R-KI which made the pt very nervous. Caregiver provided some min guard during ambulation with PT supervision and demonstrated safe technique. Pt completed stair training with care giver providing min assist to steady RW and PT providing min guard to pt, handout provided. Provided HEP handout and pt completed all exercises with cuing, pt required use of gait belt to self-assist with several. Patient will benefit from continued skilled PT interventions to address impairments and progress towards PLOF. Acute PT will follow to progress mobility and stair training in preparation for safe discharge home.    Recommendations for follow up therapy are one component of a multi-disciplinary discharge planning process, led by the attending physician.  Recommendations may be updated based on patient status, additional functional criteria and insurance authorization.  Follow Up Recommendations  Follow physician's recommendations for discharge plan and follow up therapies     Assistance Recommended at Discharge Frequent or constant Supervision/Assistance  Patient can return home with the following A little help with walking and/or transfers;A little help with bathing/dressing/bathroom;Assistance with cooking/housework;Assist for transportation;Help with stairs or ramp for  entrance   Equipment Recommendations  Rolling walker (2 wheels) (provided in PACU)    Recommendations for Other Services       Precautions / Restrictions Precautions Precautions: Fall;Knee Precaution Booklet Issued: No Precaution Comments: Recent fall; no pillow under the knee Required Braces or Orthoses: Knee Immobilizer - Right Knee Immobilizer - Right: On when out of bed or walking;Discontinue once straight leg raise with < 10 degree lag Restrictions Weight Bearing Restrictions: No Other Position/Activity Restrictions: wbat     Mobility  Bed Mobility Overal bed mobility: Needs Assistance Bed Mobility: Supine to Sit     Supine to sit: Supervision, HOB elevated     General bed mobility comments: For safety only    Transfers Overall transfer level: Needs assistance Equipment used: Rolling walker (2 wheels) Transfers: Sit to/from Stand Sit to Stand: Min guard, From elevated surface           General transfer comment: Min guard for safety, VCs for hand placement, powering up through LLE and BUE.    Ambulation/Gait Ambulation/Gait assistance: Min guard Gait Distance (Feet): 100 Feet Assistive device: Rolling walker (2 wheels) Gait Pattern/deviations: Step-to pattern Gait velocity: decreased     General Gait Details: Pt ambulated with RW and min guard, R knee in R-KI, no physical assist required. Pt had three instances of R knee buckling but between R-KI and pt's BUE support on RW pt was able to catch herself without actual LOB, though the buckling did make her very nervous secondary to her fall history. Caregiver provided min guard for portions of ambulation for education and training and demonstrated good form and technique.   Stairs Stairs: Yes Stairs assistance: Min assist Stair Management: No rails, Step to pattern, Backwards, With walker Number  of Stairs: 2 General stair comments: Provided handout on stair mobility, all questions answered. Pt required  min assist for steadying of RW, pt's caregiver provided min assist with PT providing min guard, VCs for sequencing. No overt LOB noted.   Wheelchair Mobility    Modified Rankin (Stroke Patients Only)       Balance Overall balance assessment: Needs assistance Sitting-balance support: Feet supported, No upper extremity supported Sitting balance-Leahy Scale: Good     Standing balance support: Reliant on assistive device for balance, During functional activity, Bilateral upper extremity supported Standing balance-Leahy Scale: Poor                              Cognition Arousal/Alertness: Awake/alert Behavior During Therapy: WFL for tasks assessed/performed, Anxious, Impulsive Overall Cognitive Status: Within Functional Limits for tasks assessed                                 General Comments: Pt pleasant, very loquacious, retired Museum/gallery exhibitions officer.        Exercises Total Joint Exercises Ankle Circles/Pumps: AROM, Both, 10 reps Quad Sets: AROM, Right, Other reps (comment) (2) Short Arc Quad: AAROM, Right, Other reps (comment) (2) Heel Slides: AROM, Right, Other reps (comment) (2) Hip ABduction/ADduction: AAROM, Right, Other reps (comment) (3) Straight Leg Raises: AAROM, Right, Limitations (Pt unable to perform actively, can perform AAROM with self-assist using gait belt but reports high levels of pain) Goniometric ROM: -5-40deg by gross visual approximation    General Comments General comments (skin integrity, edema, etc.): Ferrel Logan present      Pertinent Vitals/Pain Pain Assessment Pain Assessment: 0-10 Pain Score: 7  Pain Location: right knee Pain Descriptors / Indicators: Aching, Operative site guarding Pain Intervention(s): Monitored during session, Repositioned, Ice applied    Home Living Family/patient expects to be discharged to:: Private residence Living Arrangements: Alone Available Help at Discharge: Family;Available 24 hours/day Type  of Home: Apartment Home Access: Stairs to enter;Elevator Entrance Stairs-Rails: None Entrance Stairs-Number of Steps: 2 Alternate Level Stairs-Number of Steps: 20 Home Layout: Two level;Bed/bath upstairs;Able to live on main level with bedroom/bathroom Home Equipment: Shower seat;Tub bench;Cane - single point;Toilet riser      Prior Function            PT Goals (current goals can now be found in the care plan section) Acute Rehab PT Goals Patient Stated Goal: Walking in the park PT Goal Formulation: With patient Time For Goal Achievement: 07/04/22 Potential to Achieve Goals: Good Progress towards PT goals: Progressing toward goals    Frequency    7X/week      PT Plan Current plan remains appropriate    Co-evaluation              AM-PAC PT "6 Clicks" Mobility   Outcome Measure  Help needed turning from your back to your side while in a flat bed without using bedrails?: None Help needed moving from lying on your back to sitting on the side of a flat bed without using bedrails?: A Little Help needed moving to and from a bed to a chair (including a wheelchair)?: A Little Help needed standing up from a chair using your arms (e.g., wheelchair or bedside chair)?: A Little Help needed to walk in hospital room?: A Little Help needed climbing 3-5 steps with a railing? : A Lot 6 Click Score: 18  End of Session Equipment Utilized During Treatment: Gait belt;Right knee immobilizer Activity Tolerance: Patient tolerated treatment well Patient left: with call bell/phone within reach;with family/visitor present;in chair Nurse Communication: Mobility status PT Visit Diagnosis: Pain;Difficulty in walking, not elsewhere classified (R26.2) Pain - Right/Left: Right Pain - part of body: Knee     Time: 1440-1546 PT Time Calculation (min) (ACUTE ONLY): 66 min  Charges:  $Gait Training: 23-37 mins $Therapeutic Exercise: 8-22 mins $Therapeutic Activity: 8-22 mins                      Coolidge Breeze, PT, DPT [facility] Office: [phone] Weekend pager: [phone]   Coolidge Breeze 06/27/2022, 4:10 PM

## 2022-06-27 NOTE — Progress Notes (Signed)
Orthopedic Tech Progress Note Patient Details:  [name] [birth year] [id number]  Patient ID: [name], female   DOB: Mar 09, 1951, 71 y.o.   MRN: [id number]  [name] 06/27/2022, 2:51 PM Knee immobilizer delivered to pacu for PT

## 2022-06-27 NOTE — Anesthesia Postprocedure Evaluation (Signed)
Anesthesia Post Note  Patient: [name]  Procedure(s) Performed: RIGHT TOTAL KNEE ARTHROPLASTY (Right: Knee)     Patient location during evaluation: Nursing Unit Anesthesia Type: Spinal Level of consciousness: oriented and awake and alert Pain management: pain level controlled Vital Signs Assessment: post-procedure vital signs reviewed and stable Respiratory status: spontaneous breathing and respiratory function stable Cardiovascular status: blood pressure returned to baseline and stable Postop Assessment: no headache, no backache, no apparent nausea or vomiting and patient able to bend at knees Anesthetic complications: no  No notable events documented.  Last Vitals:  Vitals:   06/27/22 1030 06/27/22 1100  BP: 134/77 133/64  Pulse: 86 90  Resp:    Temp:    SpO2: 97% 94%    Last Pain:  Vitals:   06/27/22 1100  TempSrc:   PainSc: 0-No pain                 Barnet Glasgow

## 2022-06-27 NOTE — Op Note (Signed)
PREOP DIAGNOSIS: DJD RIGHT KNEE POSTOP DIAGNOSIS: same PROCEDURE: RIGHT TKR ANESTHESIA: Spinal and MAC ATTENDING SURGEON: Hessie Dibble ASSISTANT: Loni Dolly PA  INDICATIONS FOR PROCEDURE: [name] is a 71 y.o. female who has struggled for a long time with pain due to degenerative arthritis of the right knee.  The patient has failed many conservative non-operative measures and at this point has pain which limits the ability to sleep and walk.  The patient is offered total knee replacement.  Informed operative consent was obtained after discussion of possible risks of anesthesia, infection, neurovascular injury, DVT, and death.  The importance of the post-operative rehabilitation protocol to optimize result was stressed extensively with the patient.  SUMMARY OF FINDINGS AND PROCEDURE:  [name] was taken to the operative suite where under the above anesthesia a right knee replacement was performed.  There were advanced degenerative changes and the bone quality was good.  We used the DePuy Attune system and placed size 5 femur, 6 tibia, 35 mm all polyethylene patella, and a size 6 mm spacer.  Loni Dolly PA-C assisted throughout and was invaluable to the completion of the case in that he helped retract and maintain exposure while I placed components.  He also helped close thereby minimizing OR time.  The patient was admitted for appropriate post-op care to include perioperative antibiotics and mechanical and pharmacologic measures for DVT prophylaxis.  DESCRIPTION OF PROCEDURE:  [name] was taken to the operative suite where the above anesthesia was applied.  The patient was positioned supine and prepped and draped in normal sterile fashion.  An appropriate time out was performed.  After the administration of kefzol pre-op antibiotic the leg was elevated and exsanguinated and a tourniquet inflated. A standard longitudinal incision was made on the anterior knee.  Dissection  was carried down to the extensor mechanism.  All appropriate anti-infective measures were used including the pre-operative antibiotic, betadine impregnated drape, and closed hooded exhaust systems for each member of the surgical team.  A medial parapatellar incision was made in the extensor mechanism and the knee cap flipped and the knee flexed.  Some residual meniscal tissues were removed along with any remaining ACL/PCL tissue.  A guide was placed on the tibia and a flat cut was made on it's superior surface.  An intramedullary guide was placed in the femur and was utilized to make anterior and posterior cuts creating an appropriate flexion gap.  A second intramedullary guide was placed in the femur to make a distal cut properly balancing the knee with an extension gap equal to the flexion gap.  The three bones sized to the above mentioned sizes and the appropriate guides were placed and utilized.  A trial reduction was done and the knee easily came to full extension and the patella tracked well on flexion.  The trial components were removed and all bones were cleaned with pulsatile lavage and then dried thoroughly.  Cement was mixed and was pressurized onto the bones followed by placement of the aforementioned components.  Excess cement was trimmed and pressure was held on the components until the cement had hardened.  The tourniquet was deflated and a small amount of bleeding was controlled with cautery and pressure.  The knee was irrigated thoroughly.  The extensor mechanism was re-approximated with #1 ethibond in interrupted fashion.  The knee was flexed and the repair was solid.  The subcutaneous tissues were re-approximated with #0 and #2-0 vicryl and the skin closed with  a subcuticular stitch and steristrips.  A sterile dressing was applied.  Intraoperative fluids, EBL, and tourniquet time can be obtained from anesthesia records.  DISPOSITION:  The patient was taken to recovery room in stable condition  and scheduled to potentially go home same day depending on ability to walk and tolerate liquids.[name] 06/27/2022, 9:01 AM

## 2022-06-28 ENCOUNTER — Other Ambulatory Visit (HOSPITAL_COMMUNITY): Payer: Self-pay

## 2022-06-28 DIAGNOSIS — M1711 Unilateral primary osteoarthritis, right knee: Secondary | ICD-10-CM | POA: Diagnosis not present

## 2022-06-28 DIAGNOSIS — Z87891 Personal history of nicotine dependence: Secondary | ICD-10-CM | POA: Diagnosis not present

## 2022-06-28 DIAGNOSIS — Z79899 Other long term (current) drug therapy: Secondary | ICD-10-CM | POA: Diagnosis not present

## 2022-06-28 DIAGNOSIS — Z96651 Presence of right artificial knee joint: Secondary | ICD-10-CM | POA: Diagnosis not present

## 2022-06-28 NOTE — Plan of Care (Signed)
Problem: Education: Goal: Knowledge of the prescribed therapeutic regimen will improve Outcome: Completed/Met Goal: Individualized Educational Video(s) Outcome: Completed/Met   Problem: Activity: Goal: Ability to avoid complications of mobility impairment will improve 06/28/2022 1112 by Iver Nestle A, LPN Outcome: Completed/Met 06/28/2022 0741 by Lise Auer, LPN Outcome: Progressing Goal: Range of joint motion will improve Outcome: Completed/Met   Problem: Clinical Measurements: Goal: Postoperative complications will be avoided or minimized Outcome: Completed/Met   Problem: Pain Management: Goal: Pain level will decrease with appropriate interventions Outcome: Completed/Met   Problem: Skin Integrity: Goal: Will show signs of wound healing Outcome: Completed/Met   Problem: Education: Goal: Knowledge of General Education information will improve Description: Including pain rating scale, medication(s)/side effects and non-pharmacologic comfort measures Outcome: Completed/Met   Problem: Health Behavior/Discharge Planning: Goal: Ability to manage health-related needs will improve Outcome: Completed/Met   Problem: Clinical Measurements: Goal: Ability to maintain clinical measurements within normal limits will improve Outcome: Completed/Met Goal: Will remain free from infection Outcome: Completed/Met Goal: Diagnostic test results will improve Outcome: Completed/Met Goal: Respiratory complications will improve Outcome: Completed/Met Goal: Cardiovascular complication will be avoided Outcome: Completed/Met   Problem: Activity: Goal: Risk for activity intolerance will decrease Outcome: Completed/Met   Problem: Nutrition: Goal: Adequate nutrition will be maintained Outcome: Completed/Met   Problem: Coping: Goal: Level of anxiety will decrease Outcome: Completed/Met   Problem: Elimination: Goal: Will not experience complications related to bowel  motility Outcome: Completed/Met Goal: Will not experience complications related to urinary retention Outcome: Completed/Met   Problem: Pain Managment: Goal: General experience of comfort will improve Outcome: Completed/Met   Problem: Safety: Goal: Ability to remain free from injury will improve Outcome: Completed/Met   Problem: Skin Integrity: Goal: Risk for impaired skin integrity will decrease Outcome: Completed/Met   Problem: Education: Goal: Knowledge of the prescribed therapeutic regimen will improve Outcome: Completed/Met Goal: Individualized Educational Video(s) Outcome: Completed/Met   Problem: Activity: Goal: Ability to avoid complications of mobility impairment will improve 06/28/2022 1112 by Iver Nestle A, LPN Outcome: Completed/Met 06/28/2022 0741 by Lise Auer, LPN Outcome: Progressing Goal: Range of joint motion will improve Outcome: Completed/Met   Problem: Clinical Measurements: Goal: Postoperative complications will be avoided or minimized Outcome: Completed/Met   Problem: Pain Management: Goal: Pain level will decrease with appropriate interventions 06/28/2022 1112 by Iver Nestle A, LPN Outcome: Completed/Met 06/28/2022 0741 by Lise Auer, LPN Outcome: Progressing   Problem: Skin Integrity: Goal: Will show signs of wound healing Outcome: Completed/Met   Problem: Education: Goal: Knowledge of General Education information will improve Description: Including pain rating scale, medication(s)/side effects and non-pharmacologic comfort measures Outcome: Completed/Met   Problem: Health Behavior/Discharge Planning: Goal: Ability to manage health-related needs will improve Outcome: Completed/Met   Problem: Clinical Measurements: Goal: Ability to maintain clinical measurements within normal limits will improve Outcome: Completed/Met Goal: Will remain free from infection Outcome: Completed/Met Goal: Diagnostic test results will  improve Outcome: Completed/Met Goal: Respiratory complications will improve Outcome: Completed/Met Goal: Cardiovascular complication will be avoided Outcome: Completed/Met   Problem: Activity: Goal: Risk for activity intolerance will decrease Outcome: Completed/Met   Problem: Nutrition: Goal: Adequate nutrition will be maintained Outcome: Completed/Met   Problem: Coping: Goal: Level of anxiety will decrease Outcome: Completed/Met   Problem: Elimination: Goal: Will not experience complications related to bowel motility Outcome: Completed/Met Goal: Will not experience complications related to urinary retention Outcome: Completed/Met   Problem: Pain Managment: Goal: General experience of comfort will improve Outcome: Completed/Met   Problem: Safety: Goal: Ability to remain  free from injury will improve Outcome: Completed/Met   Problem: Skin Integrity: Goal: Risk for impaired skin integrity will decrease Outcome: Completed/Met

## 2022-06-28 NOTE — Discharge Summary (Signed)
Patient ID: [name] MRN: [id number] DOB/AGE: 70-Jul-1952 53 y.o.  Admit date: 06/27/2022 Discharge date: 06/28/2022  Admission Diagnoses:  Principal Problem:   S/P total knee arthroplasty, right   Discharge Diagnoses:  Same  Past Medical History:  Diagnosis Date   Anemia    Anxiety    Arthritis    Complication of anesthesia    past hx. laryngospasm following 2 past surgeries, not recent   Depression    Exertional dyspnea 08/04/2019   GERD (gastroesophageal reflux disease)    Hiatal hernia 10/17/2019   Hyperlipidemia    Insomnia    periodically uses med.   Perforated sigmoid colon ([place]) 04/28/2014   during colonoscopy, "rupture colon"became septic, has colostomy and open surgical wound   Pneumonia    Pre-diabetes    Prediabetes 08/09/2017   Suicide attempt ([place])    Vitamin D deficiency     Surgeries: Procedure(s): RIGHT TOTAL KNEE ARTHROPLASTY on 06/27/2022   Consultants:   Discharged Condition: Improved  Hospital Course: [name] is an 71 y.o. female who was admitted 06/27/2022 for operative treatment ofS/P total knee arthroplasty, right. Patient has severe unremitting pain that affects sleep, daily activities, and work/hobbies. After pre-op clearance the patient was taken to the operating room on 06/27/2022 and underwent  Procedure(s): RIGHT TOTAL KNEE ARTHROPLASTY.    Patient was given perioperative antibiotics:  Anti-infectives (From admission, onward)    Start     Dose/Rate Route Frequency Ordered Stop   06/27/22 1400  ceFAZolin (ANCEF) IVPB 2g/100 mL premix        2 g 200 mL/hr over 30 Minutes Intravenous Every 6 hours 06/27/22 1111 06/27/22 2046   06/27/22 0600  ceFAZolin (ANCEF) IVPB 2g/100 mL premix        2 g 200 mL/hr over 30 Minutes Intravenous On call to O.R. 06/27/22 3220 06/27/22 0737        Patient was given sequential compression devices, early ambulation, and chemoprophylaxis to prevent DVT.  Patient benefited  maximally from hospital stay and there were no complications.    Recent vital signs: Patient Vitals for the past 24 hrs:  BP Temp Temp src Pulse Resp SpO2  06/28/22 0642 122/70 99.3 F (37.4 C) Oral 86 17 97 %  06/28/22 0022 123/68 97.8 F (36.6 C) Oral 80 17 95 %  06/27/22 1959 (!) 153/92 98.5 F (36.9 C) Oral 73 17 100 %  06/27/22 1658 (!) 157/88 -- -- 92 18 98 %  06/27/22 1600 (!) 156/93 -- -- 81 13 95 %  06/27/22 1434 (!) 137/94 -- -- (!) 105 14 94 %  06/27/22 1250 138/68 -- -- 79 -- 97 %  06/27/22 1200 130/82 -- -- 73 14 95 %  06/27/22 1100 133/64 -- -- 90 14 94 %  06/27/22 1030 134/77 -- -- 86 -- 97 %  06/27/22 1015 136/79 97.6 F (36.4 C) -- 86 16 97 %  06/27/22 1000 (!) 136/90 -- -- 91 18 97 %  06/27/22 0945 133/76 -- -- (!) 102 16 96 %  06/27/22 0930 118/78 -- -- 90 13 98 %  06/27/22 0925 (!) 122/52 (!) 97.5 F (36.4 C) -- 93 15 97 %     Recent laboratory studies: No results for input(s): "WBC", "HGB", "HCT", "PLT", "NA", "K", "CL", "CO2", "BUN", "CREATININE", "GLUCOSE", "INR", "CALCIUM" in the last 72 hours.  Invalid input(s): "PT", "2"   Discharge Medications:   Allergies as of 06/28/2022       Reactions  Bee Venom Swelling   Yellow jackets   Statins Other (See Comments)   Muscle pains CRESTOR, LIPITOR        Medication List     STOP taking these medications    ibuprofen 200 MG tablet Commonly known as: ADVIL       TAKE these medications    aspirin EC 81 MG tablet Take 1 tablet (81 mg total) by mouth 2 (two) times daily after a meal for 2 weeks, then take 1 tablet once a day for 2 weeks for DVT prevention.   esomeprazole 40 MG capsule Commonly known as: NEXIUM Take 1 capsule (40 mg total) by mouth 2 (two) times daily before a meal.   eszopiclone 2 MG Tabs tablet Commonly known as: LUNESTA Take 1 tablet by mouth daily at bedtime.   FIBER CHOICE PO Take 1 capsule by mouth daily.   gabapentin 600 MG tablet Commonly known as:  NEURONTIN Take 1 tablet (600 mg total) by mouth every evening.   HYDROcodone-acetaminophen 5-325 MG tablet Commonly known as: NORCO/VICODIN Take 1-2 tablets by mouth every 6 (six) hours as needed for moderate or severe pain (post op pain).   lamoTRIgine 150 MG tablet Commonly known as: LAMICTAL Take 1 tablet by mouth once a day   ondansetron 4 MG disintegrating tablet Commonly known as: ZOFRAN-ODT Take 1-2 tablets every 6 hours as needed   prazosin 2 MG capsule Commonly known as: MINIPRESS Take 1 capsule (2 mg total) by mouth at bedtime.   SLOW FE PO Take 1 tablet by mouth daily.   tiZANidine 4 MG tablet Commonly known as: Zanaflex Take 1 tablet (4 mg total) by mouth every 6 (six) hours as needed for muscle spasms.   Vitamin D (Ergocalciferol) 1.25 MG (50000 UNIT) Caps capsule Commonly known as: DRISDOL Take 1 capsule by mouth every 7 days. What changed: when to take this               Durable Medical Equipment  (From admission, onward)           Start     Ordered   06/27/22 1701  DME 3 n 1  Once        06/27/22 1700   06/27/22 1701  DME Bedside commode  Once       Question:  Patient needs a bedside commode to treat with the following condition  Answer:  Primary osteoarthritis of right knee   06/27/22 1700   06/27/22 1028  DME Walker rolling  Once       Question:  Patient needs a walker to treat with the following condition  Answer:  Primary osteoarthritis of right knee   06/27/22 1027            Diagnostic Studies: CT Chest High Resolution  Result Date: 06/26/2022 CLINICAL DATA:  Possible right lung base pulmonary nodule on chest radiograph. Former smoker. Current vaping. EXAM: CT CHEST WITHOUT CONTRAST TECHNIQUE: Multidetector CT imaging of the chest was performed following the standard protocol without intravenous contrast. High resolution imaging of the lungs, as well as inspiratory and expiratory imaging, was performed. RADIATION DOSE REDUCTION:  This exam was performed according to the departmental dose-optimization program which includes automated exposure control, adjustment of the mA and/or kV according to patient size and/or use of iterative reconstruction technique. COMPARISON:  09/04/2019 coronary CT. 05/06/2014 chest CT angiogram. 06/14/2022 chest radiograph. FINDINGS: Cardiovascular: Normal heart size. No significant pericardial effusion/thickening. Atherosclerotic nonaneurysmal thoracic aorta. Normal caliber pulmonary arteries. Mediastinum/Nodes:  No significant thyroid nodules. Mildly patulous and otherwise normal thoracic esophagus. No pathologically enlarged axillary, mediastinal or hilar lymph nodes, noting limited sensitivity for the detection of hilar adenopathy on this noncontrast study. Lungs/Pleura: No pneumothorax. No pleural effusion. No significant lobular air trapping or evidence of tracheobronchomalacia on the expiration sequence. No significant regions of subpleural reticulation, ground-glass opacity, traction bronchiectasis, architectural distortion or frank honeycombing. No acute consolidative airspace disease, lung masses or significant pulmonary nodules. Upper abdomen: Moderate hiatal hernia. Indistinct hypodense 1.1 cm posterior right liver dome lesion (series 2/image 121), stable since 05/06/2014 CT, compatible with a benign etiology. Musculoskeletal: No aggressive appearing focal osseous lesions. Mild thoracic spondylosis. IMPRESSION: 1. No evidence of interstitial lung disease. No lung masses or significant pulmonary nodules. 2. Moderate hiatal hernia. Mildly patulous and otherwise normal thoracic esophagus, suggesting chronic esophageal dysmotility. 3.  Aortic Atherosclerosis (ICD10-I70.0). Electronically Signed   By: Ilona Sorrel M.D.   On: 06/26/2022 12:14   DG Chest 2 View  Result Date: 06/17/2022 CLINICAL DATA:  Preoperative assessment for right knee arthroplasty EXAM: CHEST - 2 VIEW COMPARISON:  01/09/2015 FINDINGS:  Frontal and lateral views of the chest demonstrate an unremarkable cardiac silhouette. There is a 1.2 cm nodular density projecting over the right anterior fifth rib at the right lung base. This is not well seen on the lateral view. While this could reflect summation of shadows, CT chest without contrast is recommended to exclude pulmonary nodule. No other airspace disease, effusion, or pneumothorax. No acute bony abnormalities. IMPRESSION: 1. Possible 1.2 cm nodule at the right lung base, versus superimposed shadow involving the right anterior fifth rib. CT chest without contrast is recommended for further evaluation. 2. Otherwise no acute intrathoracic process. These results will be called to the ordering clinician or representative by the Radiologist Assistant, and communication documented in the PACS or [employer]. Electronically Signed   By: Randa Ngo M.D.   On: 06/17/2022 23:18    Disposition: Discharge disposition: 01-Home or Self Care       Discharge Instructions     Call MD / Call 911   Complete by: As directed    If you experience chest pain or shortness of breath, CALL 911 and be transported to the hospital emergency room.  If you develope a fever above 101 F, pus (white drainage) or increased drainage or redness at the wound, or calf pain, call your surgeon's office.   Call MD / Call 911   Complete by: As directed    If you experience chest pain or shortness of breath, CALL 911 and be transported to the hospital emergency room.  If you develope a fever above 101 F, pus (white drainage) or increased drainage or redness at the wound, or calf pain, call your surgeon's office.   Constipation Prevention   Complete by: As directed    Drink plenty of fluids.  Prune juice may be helpful.  You may use a stool softener, such as Colace (over the counter) 100 mg twice a day.  Use MiraLax (over the counter) for constipation as needed.   Constipation Prevention   Complete by: As  directed    Drink plenty of fluids.  Prune juice may be helpful.  You may use a stool softener, such as Colace (over the counter) 100 mg twice a day.  Use MiraLax (over the counter) for constipation as needed.   Diet - low sodium heart healthy   Complete by: As directed    Diet - low  sodium heart healthy   Complete by: As directed    Discharge instructions   Complete by: As directed    INSTRUCTIONS AFTER JOINT REPLACEMENT   Remove items at home which could result in a fall. This includes throw rugs or furniture in walking pathways ICE to the affected joint every three hours while awake for 30 minutes at a time, for at least the first 3-5 days, and then as needed for pain and swelling.  Continue to use ice for pain and swelling. You may notice swelling that will progress down to the foot and ankle.  This is normal after surgery.  Elevate your leg when you are not up walking on it.   Continue to use the breathing machine you got in the hospital (incentive spirometer) which will help keep your temperature down.  It is common for your temperature to cycle up and down following surgery, especially at night when you are not up moving around and exerting yourself.  The breathing machine keeps your lungs expanded and your temperature down.   DIET:  As you were doing prior to hospitalization, we recommend a well-balanced diet.  DRESSING / WOUND CARE / SHOWERING  You may shower 3 days after surgery, but keep the wounds dry during showering.  You may use an occlusive plastic wrap (Press'n Seal for example), NO SOAKING/SUBMERGING IN THE BATHTUB.  If the bandage gets wet, change with a clean dry gauze.  If the incision gets wet, pat the wound dry with a clean towel.  ACTIVITY  Increase activity slowly as tolerated, but follow the weight bearing instructions below.   No driving for 6 weeks or until further direction given by your physician.  You cannot drive while taking narcotics.  No lifting or  carrying greater than 10 lbs. until further directed by your surgeon. Avoid periods of inactivity such as sitting longer than an hour when not asleep. This helps prevent blood clots.  You may return to work once you are authorized by your doctor.     WEIGHT BEARING   Weight bearing as tolerated with assist device (walker, cane, etc) as directed, use it as long as suggested by your surgeon or therapist, typically at least 4-6 weeks.   EXERCISES  Results after joint replacement surgery are often greatly improved when you follow the exercise, range of motion and muscle strengthening exercises prescribed by your doctor. Safety measures are also important to protect the joint from further injury. Any time any of these exercises cause you to have increased pain or swelling, decrease what you are doing until you are comfortable again and then slowly increase them. If you have problems or questions, call your caregiver or physical therapist for advice.   Rehabilitation is important following a joint replacement. After just a few days of immobilization, the muscles of the leg can become weakened and shrink (atrophy).  These exercises are designed to build up the tone and strength of the thigh and leg muscles and to improve motion. Often times heat used for twenty to thirty minutes before working out will loosen up your tissues and help with improving the range of motion but do not use heat for the first two weeks following surgery (sometimes heat can increase post-operative swelling).   These exercises can be done on a training (exercise) mat, on the floor, on a table or on a bed. Use whatever works the best and is most comfortable for you.    Use music or television while you  are exercising so that the exercises are a pleasant break in your day. This will make your life better with the exercises acting as a break in your routine that you can look forward to.   Perform all exercises about fifteen times,  three times per day or as directed.  You should exercise both the operative leg and the other leg as well.  Exercises include:   Quad Sets - Tighten up the muscle on the front of the thigh (Quad) and hold for 5-10 seconds.   Straight Leg Raises - With your knee straight (if you were given a brace, keep it on), lift the leg to 60 degrees, hold for 3 seconds, and slowly lower the leg.  Perform this exercise against resistance later as your leg gets stronger.  Leg Slides: Lying on your back, slowly slide your foot toward your buttocks, bending your knee up off the floor (only go as far as is comfortable). Then slowly slide your foot back down until your leg is flat on the floor again.  Angel Wings: Lying on your back spread your legs to the side as far apart as you can without causing discomfort.  Hamstring Strength:  Lying on your back, push your heel against the floor with your leg straight by tightening up the muscles of your buttocks.  Repeat, but this time bend your knee to a comfortable angle, and push your heel against the floor.  You may put a pillow under the heel to make it more comfortable if necessary.   A rehabilitation program following joint replacement surgery can speed recovery and prevent re-injury in the future due to weakened muscles. Contact your doctor or a physical therapist for more information on knee rehabilitation.    CONSTIPATION  Constipation is defined medically as fewer than three stools per week and severe constipation as less than one stool per week.  Even if you have a regular bowel pattern at home, your normal regimen is likely to be disrupted due to multiple reasons following surgery.  Combination of anesthesia, postoperative narcotics, change in appetite and fluid intake all can affect your bowels.   YOU MUST use at least one of the following options; they are listed in order of increasing strength to get the job done.  They are all available over the counter, and  you may need to use some, POSSIBLY even all of these options:    Drink plenty of fluids (prune juice may be helpful) and high fiber foods Colace 100 mg by mouth twice a day  Senokot for constipation as directed and as needed Dulcolax (bisacodyl), take with full glass of water  Miralax (polyethylene glycol) once or twice a day as needed.  If you have tried all these things and are unable to have a bowel movement in the first 3-4 days after surgery call either your surgeon or your primary doctor.    If you experience loose stools or diarrhea, hold the medications until you stool forms back up.  If your symptoms do not get better within 1 week or if they get worse, check with your doctor.  If you experience "the worst abdominal pain ever" or develop nausea or vomiting, please contact the office immediately for further recommendations for treatment.   ITCHING:  If you experience itching with your medications, try taking only a single pain pill, or even half a pain pill at a time.  You can also use Benadryl over the counter for itching or also to help  with sleep.   TED HOSE STOCKINGS:  Use stockings on both legs until for at least 2 weeks or as directed by physician office. They may be removed at night for sleeping.  MEDICATIONS:  See your medication summary on the "After Visit Summary" that nursing will review with you.  You may have some home medications which will be placed on hold until you complete the course of blood thinner medication.  It is important for you to complete the blood thinner medication as prescribed.  PRECAUTIONS:  If you experience chest pain or shortness of breath - call 911 immediately for transfer to the hospital emergency department.   If you develop a fever greater that 101 F, purulent drainage from wound, increased redness or drainage from wound, foul odor from the wound/dressing, or calf pain - CONTACT YOUR SURGEON.                                                    FOLLOW-UP APPOINTMENTS:  If you do not already have a post-op appointment, please call the office for an appointment to be seen by your surgeon.  Guidelines for how soon to be seen are listed in your "After Visit Summary", but are typically between 1-4 weeks after surgery.  OTHER INSTRUCTIONS:   Knee Replacement:  Do not place pillow under knee, focus on keeping the knee straight while resting. CPM instructions: 0-90 degrees, 2 hours in the morning, 2 hours in the afternoon, and 2 hours in the evening. Place foam block, curve side up under heel at all times except when in CPM or when walking.  DO NOT modify, tear, cut, or change the foam block in any way.  POST-OPERATIVE OPIOID TAPER INSTRUCTIONS: It is important to wean off of your opioid medication as soon as possible. If you do not need pain medication after your surgery it is ok to stop day one. Opioids include: Codeine, Hydrocodone(Norco, Vicodin), Oxycodone(Percocet, oxycontin) and hydromorphone amongst others.  Long term and even short term use of opiods can cause: Increased pain response Dependence Constipation Depression Respiratory depression And more.  Withdrawal symptoms can include Flu like symptoms Nausea, vomiting And more Techniques to manage these symptoms Hydrate well Eat regular healthy meals Stay active Use relaxation techniques(deep breathing, meditating, yoga) Do Not substitute Alcohol to help with tapering If you have been on opioids for less than two weeks and do not have pain than it is ok to stop all together.  Plan to wean off of opioids This plan should start within one week post op of your joint replacement. Maintain the same interval or time between taking each dose and first decrease the dose.  Cut the total daily intake of opioids by one tablet each day Next start to increase the time between doses. The last dose that should be eliminated is the evening dose.     MAKE SURE YOU:  Understand these  instructions.  Get help right away if you are not doing well or get worse.    Thank you for letting us be a part of your medical care team.  It is a privilege we respect greatly.  We hope these instructions will help you stay on track for a fast and full recovery!   Discharge instructions   Complete by: As directed    [place]  Remove items at home which could result in a fall. This includes throw rugs or furniture in walking pathways ICE to the affected joint every three hours while awake for 30 minutes at a time, for at least the first 3-5 days, and then as needed for pain and swelling.  Continue to use ice for pain and swelling. You may notice swelling that will progress down to the foot and ankle.  This is normal after surgery.  Elevate your leg when you are not up walking on it.   Continue to use the breathing machine you got in the hospital (incentive spirometer) which will help keep your temperature down.  It is common for your temperature to cycle up and down following surgery, especially at night when you are not up moving around and exerting yourself.  The breathing machine keeps your lungs expanded and your temperature down.   DIET:  As you were doing prior to hospitalization, we recommend a well-balanced diet.  DRESSING / WOUND CARE / SHOWERING  You may shower 3 days after surgery, but keep the wounds dry during showering.  You may use an occlusive plastic wrap (Press'n Seal for example), NO SOAKING/SUBMERGING IN THE BATHTUB.  If the bandage gets wet, change with a clean dry gauze.  If the incision gets wet, pat the wound dry with a clean towel.  ACTIVITY  Increase activity slowly as tolerated, but follow the weight bearing instructions below.   No driving for 6 weeks or until further direction given by your physician.  You cannot drive while taking narcotics.  No lifting or carrying greater than 10 lbs. until further directed by your surgeon. Avoid  periods of inactivity such as sitting longer than an hour when not asleep. This helps prevent blood clots.  You may return to work once you are authorized by your doctor.     WEIGHT BEARING   Weight bearing as tolerated with assist device (walker, cane, etc) as directed, use it as long as suggested by your surgeon or therapist, typically at least 4-6 weeks.   EXERCISES  Results after joint replacement surgery are often greatly improved when you follow the exercise, range of motion and muscle strengthening exercises prescribed by your doctor. Safety measures are also important to protect the joint from further injury. Any time any of these exercises cause you to have increased pain or swelling, decrease what you are doing until you are comfortable again and then slowly increase them. If you have problems or questions, call your caregiver or physical therapist for advice.   Rehabilitation is important following a joint replacement. After just a few days of immobilization, the muscles of the leg can become weakened and shrink (atrophy).  These exercises are designed to build up the tone and strength of the thigh and leg muscles and to improve motion. Often times heat used for twenty to thirty minutes before working out will loosen up your tissues and help with improving the range of motion but do not use heat for the first two weeks following surgery (sometimes heat can increase post-operative swelling).   These exercises can be done on a training (exercise) mat, on the floor, on a table or on a bed. Use whatever works the best and is most comfortable for you.    Use music or television while you are exercising so that the exercises are a pleasant break in your day. This will make your life better with the exercises acting as a break in your routine  that you can look forward to.   Perform all exercises about fifteen times, three times per day or as directed.  You should exercise both the operative leg  and the other leg as well.  Exercises include:   Quad Sets - Tighten up the muscle on the front of the thigh (Quad) and hold for 5-10 seconds.   Straight Leg Raises - With your knee straight (if you were given a brace, keep it on), lift the leg to 60 degrees, hold for 3 seconds, and slowly lower the leg.  Perform this exercise against resistance later as your leg gets stronger.  Leg Slides: Lying on your back, slowly slide your foot toward your buttocks, bending your knee up off the floor (only go as far as is comfortable). Then slowly slide your foot back down until your leg is flat on the floor again.  Angel Wings: Lying on your back spread your legs to the side as far apart as you can without causing discomfort.  Hamstring Strength:  Lying on your back, push your heel against the floor with your leg straight by tightening up the muscles of your buttocks.  Repeat, but this time bend your knee to a comfortable angle, and push your heel against the floor.  You may put a pillow under the heel to make it more comfortable if necessary.   A rehabilitation program following joint replacement surgery can speed recovery and prevent re-injury in the future due to weakened muscles. Contact your doctor or a physical therapist for more information on knee rehabilitation.    CONSTIPATION  Constipation is defined medically as fewer than three stools per week and severe constipation as less than one stool per week.  Even if you have a regular bowel pattern at home, your normal regimen is likely to be disrupted due to multiple reasons following surgery.  Combination of anesthesia, postoperative narcotics, change in appetite and fluid intake all can affect your bowels.   YOU MUST use at least one of the following options; they are listed in order of increasing strength to get the job done.  They are all available over the counter, and you may need to use some, POSSIBLY even all of these options:    Drink plenty  of fluids (prune juice may be helpful) and high fiber foods Colace 100 mg by mouth twice a day  Senokot for constipation as directed and as needed Dulcolax (bisacodyl), take with full glass of water  Miralax (polyethylene glycol) once or twice a day as needed.  If you have tried all these things and are unable to have a bowel movement in the first 3-4 days after surgery call either your surgeon or your primary doctor.    If you experience loose stools or diarrhea, hold the medications until you stool forms back up.  If your symptoms do not get better within 1 week or if they get worse, check with your doctor.  If you experience "the worst abdominal pain ever" or develop nausea or vomiting, please contact the office immediately for further recommendations for treatment.   ITCHING:  If you experience itching with your medications, try taking only a single pain pill, or even half a pain pill at a time.  You can also use Benadryl over the counter for itching or also to help with sleep.   TED HOSE STOCKINGS:  Use stockings on both legs until for at least 2 weeks or as directed by physician office. They may be removed  at night for sleeping.  MEDICATIONS:  See your medication summary on the "After Visit Summary" that nursing will review with you.  You may have some home medications which will be placed on hold until you complete the course of blood thinner medication.  It is important for you to complete the blood thinner medication as prescribed.  PRECAUTIONS:  If you experience chest pain or shortness of breath - call 911 immediately for transfer to the hospital emergency department.   If you develop a fever greater that 101 F, purulent drainage from wound, increased redness or drainage from wound, foul odor from the wound/dressing, or calf pain - CONTACT YOUR SURGEON.                                                   FOLLOW-UP APPOINTMENTS:  If you do not already have a post-op appointment, please  call the office for an appointment to be seen by your surgeon.  Guidelines for how soon to be seen are listed in your "After Visit Summary", but are typically between 1-4 weeks after surgery.  OTHER INSTRUCTIONS:   Knee Replacement:  Do not place pillow under knee, focus on keeping the knee straight while resting. CPM instructions: 0-90 degrees, 2 hours in the morning, 2 hours in the afternoon, and 2 hours in the evening. Place foam block, curve side up under heel at all times except when in CPM or when walking.  DO NOT modify, tear, cut, or change the foam block in any way.  POST-OPERATIVE OPIOID TAPER INSTRUCTIONS: It is important to wean off of your opioid medication as soon as possible. If you do not need pain medication after your surgery it is ok to stop day one. Opioids include: Codeine, Hydrocodone(Norco, Vicodin), Oxycodone(Percocet, oxycontin) and hydromorphone amongst others.  Long term and even short term use of opiods can cause: Increased pain response Dependence Constipation Depression Respiratory depression And more.  Withdrawal symptoms can include Flu like symptoms Nausea, vomiting And more Techniques to manage these symptoms Hydrate well Eat regular healthy meals Stay active Use relaxation techniques(deep breathing, meditating, yoga) Do Not substitute Alcohol to help with tapering If you have been on opioids for less than two weeks and do not have pain than it is ok to stop all together.  Plan to wean off of opioids This plan should start within one week post op of your joint replacement. Maintain the same interval or time between taking each dose and first decrease the dose.  Cut the total daily intake of opioids by one tablet each day Next start to increase the time between doses. The last dose that should be eliminated is the evening dose.     MAKE SURE YOU:  Understand these instructions.  Get help right away if you are not doing well or get worse.     Thank you for letting us be a part of your medical care team.  It is a privilege we respect greatly.  We hope these instructions will help you stay on track for a fast and full recovery!   Increase activity slowly as tolerated   Complete by: As directed    Increase activity slowly as tolerated   Complete by: As directed    Post-operative opioid taper instructions:   Complete by: As directed    POST-OPERATIVE OPIOID TAPER INSTRUCTIONS:  It is important to wean off of your opioid medication as soon as possible. If you do not need pain medication after your surgery it is ok to stop day one. Opioids include: Codeine, Hydrocodone(Norco, Vicodin), Oxycodone(Percocet, oxycontin) and hydromorphone amongst others.  Long term and even short term use of opiods can cause: Increased pain response Dependence Constipation Depression Respiratory depression And more.  Withdrawal symptoms can include Flu like symptoms Nausea, vomiting And more Techniques to manage these symptoms Hydrate well Eat regular healthy meals Stay active Use relaxation techniques(deep breathing, meditating, yoga) Do Not substitute Alcohol to help with tapering If you have been on opioids for less than two weeks and do not have pain than it is ok to stop all together.  Plan to wean off of opioids This plan should start within one week post op of your joint replacement. Maintain the same interval or time between taking each dose and first decrease the dose.  Cut the total daily intake of opioids by one tablet each day Next start to increase the time between doses. The last dose that should be eliminated is the evening dose.      Post-operative opioid taper instructions:   Complete by: As directed    POST-OPERATIVE OPIOID TAPER INSTRUCTIONS: It is important to wean off of your opioid medication as soon as possible. If you do not need pain medication after your surgery it is ok to stop day one. Opioids  include: Codeine, Hydrocodone(Norco, Vicodin), Oxycodone(Percocet, oxycontin) and hydromorphone amongst others.  Long term and even short term use of opiods can cause: Increased pain response Dependence Constipation Depression Respiratory depression And more.  Withdrawal symptoms can include Flu like symptoms Nausea, vomiting And more Techniques to manage these symptoms Hydrate well Eat regular healthy meals Stay active Use relaxation techniques(deep breathing, meditating, yoga) Do Not substitute Alcohol to help with tapering If you have been on opioids for less than two weeks and do not have pain than it is ok to stop all together.  Plan to wean off of opioids This plan should start within one week post op of your joint replacement. Maintain the same interval or time between taking each dose and first decrease the dose.  Cut the total daily intake of opioids by one tablet each day Next start to increase the time between doses. The last dose that should be eliminated is the evening dose.           Follow-up Information     Melrose Nakayama, MD. Go on 07/07/2022.   Specialty: Orthopedic Surgery Why: Your appointment is scheduled for 10:00 Contact information: [place] [phone]         [facility] Follow up.   Specialty: [place] Why: HHPT will provide 6 visits prior to starting outpatient physical therapy Contact information: [address] [place] [address] [phone]         [facility], [place]. Go on 07/07/2022.   Why: Your outpatient physical therapy appointment is scheduled for 10:40. Please arrive at 10:20 to complete your paperwork Contact information: Physical Therapy [place] [address] [phone]                  Signed: Larwance Sachs Jyron Turman 06/28/2022, 8:13 AM

## 2022-06-28 NOTE — Progress Notes (Signed)
Physical Therapy Treatment Patient Details Name: [name] MRN: [id number] DOB: 1951-03-17 Today's Date: 06/28/2022   History of Present Illness Pt is a 71yo female presenting s/p R-TKA on 06/27/22. PMH: anemia, anxiety & depression, GERD, HLD, PNA, DM.    PT Comments    Pt is progressing well with mobility, she ambulated 200' with RW, with no buckling of R knee. Reviewed stair training and TKA HEP, pt demonstrates good understanding of both. She is ready to DC home from a PT standpoint.     Recommendations for follow up therapy are one component of a multi-disciplinary discharge planning process, led by the attending physician.  Recommendations may be updated based on patient status, additional functional criteria and insurance authorization.  Follow Up Recommendations  Follow physician's recommendations for discharge plan and follow up therapies     Assistance Recommended at Discharge Intermittent Supervision/Assistance  Patient can return home with the following A little help with walking and/or transfers;A little help with bathing/dressing/bathroom;Assistance with cooking/housework;Assist for transportation;Help with stairs or ramp for entrance   Equipment Recommendations  Rolling walker (2 wheels)    Recommendations for Other Services       Precautions / Restrictions Precautions Precautions: Fall;Knee Precaution Booklet Issued: No Precaution Comments: Recent fall; no pillow under the knee Restrictions Weight Bearing Restrictions: No Other Position/Activity Restrictions: wbat     Mobility  Bed Mobility               General bed mobility comments: up in bathroom    Transfers Overall transfer level: Needs assistance Equipment used: Rolling walker (2 wheels) Transfers: Sit to/from Stand Sit to Stand: Supervision           General transfer comment: VCs for hand placement    Ambulation/Gait Ambulation/Gait assistance: Supervision Gait Distance  (Feet): 200 Feet Assistive device: Rolling walker (2 wheels) Gait Pattern/deviations: Step-to pattern, Decreased step length - right, Decreased step length - left Gait velocity: decreased     General Gait Details: steady, no buckling of knee, no loss of balance, VCs for heel strike and to flex R knee during swing phase   Stairs Stairs: Yes Stairs assistance: Min assist Stair Management: No rails, Step to pattern, Backwards, With walker Number of Stairs: 2 General stair comments: VCs sequencing, min A to steady RW   Wheelchair Mobility    Modified Rankin (Stroke Patients Only)       Balance Overall balance assessment: Needs assistance Sitting-balance support: Feet supported, No upper extremity supported Sitting balance-Leahy Scale: Good     Standing balance support: Reliant on assistive device for balance, During functional activity, Bilateral upper extremity supported Standing balance-Leahy Scale: Poor                              Cognition Arousal/Alertness: Awake/alert Behavior During Therapy: WFL for tasks assessed/performed, Anxious, Impulsive Overall Cognitive Status: Within Functional Limits for tasks assessed                                 General Comments: Pt pleasant, retired Museum/gallery exhibitions officer.        Exercises Total Joint Exercises Ankle Circles/Pumps: AROM, Both, 10 reps, Supine Quad Sets: AROM, Right, 5 reps Short Arc Quad: AAROM, Right, Supine, 5 reps Heel Slides: AROM, Right, Supine, 5 reps Hip ABduction/ADduction: AAROM, Right, 10 reps Straight Leg Raises: Right, 5 reps, Supine, AROM (Pt unable  to perform actively, can perform AAROM with self-assist using gait belt but reports high levels of pain) Goniometric ROM: 5-65* AAROM R knee    General Comments        Pertinent Vitals/Pain Pain Assessment Pain Score: 6  Pain Location: right knee Pain Descriptors / Indicators: Aching, Operative site guarding Pain Intervention(s):  Limited activity within patient's tolerance, Monitored during session, Premedicated before session, Ice applied    Home Living                          Prior Function            PT Goals (current goals can now be found in the care plan section) Acute Rehab PT Goals Patient Stated Goal: Walking in the park PT Goal Formulation: With patient Time For Goal Achievement: 07/04/22 Potential to Achieve Goals: Good Progress towards PT goals: Goals met/education completed, patient discharged from PT    Frequency    7X/week      PT Plan Current plan remains appropriate    Co-evaluation              AM-PAC PT "6 Clicks" Mobility   Outcome Measure  Help needed turning from your back to your side while in a flat bed without using bedrails?: None Help needed moving from lying on your back to sitting on the side of a flat bed without using bedrails?: A Little Help needed moving to and from a bed to a chair (including a wheelchair)?: None Help needed standing up from a chair using your arms (e.g., wheelchair or bedside chair)?: None Help needed to walk in hospital room?: None Help needed climbing 3-5 steps with a railing? : A Little 6 Click Score: 22    End of Session Equipment Utilized During Treatment: Gait belt Activity Tolerance: Patient tolerated treatment well Patient left: with call bell/phone within reach;in chair Nurse Communication: Mobility status PT Visit Diagnosis: Pain;Difficulty in walking, not elsewhere classified (R26.2) Pain - Right/Left: Right Pain - part of body: Knee     Time: 0932-3557 PT Time Calculation (min) (ACUTE ONLY): 47 min  Charges:  $Gait Training: 8-22 mins $Therapeutic Exercise: 8-22 mins $Therapeutic Activity: 8-22 mins                    Blondell Reveal Kistler PT 06/28/2022  Acute Rehabilitation Services  Office [phone]

## 2022-06-28 NOTE — Plan of Care (Signed)
  Problem: Activity: Goal: Ability to avoid complications of mobility impairment will improve Outcome: Progressing   Problem: Activity: Goal: Ability to avoid complications of mobility impairment will improve Outcome: Progressing   Problem: Pain Management: Goal: Pain level will decrease with appropriate interventions Outcome: Progressing

## 2022-06-28 NOTE — Progress Notes (Signed)
Subjective: 1 Day Post-Op Procedure(s) (LRB): RIGHT TOTAL KNEE ARTHROPLASTY (Right)  Patient doing well. She is hoping to go home today.  Activity level:  wbat Diet tolerance:  ok Voiding:  ok Patient reports pain as mild.    Objective: Vital signs in last 24 hours: Temp:  [97.5 F (36.4 C)-99.3 F (37.4 C)] 99.3 F (37.4 C) (11/22 0642) Pulse Rate:  [73-105] 86 (11/22 0642) Resp:  [13-18] 17 (11/22 0642) BP: (118-157)/(52-94) 122/70 (11/22 0642) SpO2:  [94 %-100 %] 97 % (11/22 0642)  Labs: No results for input(s): "HGB" in the last 72 hours. No results for input(s): "WBC", "RBC", "HCT", "PLT" in the last 72 hours. No results for input(s): "NA", "K", "CL", "CO2", "BUN", "CREATININE", "GLUCOSE", "CALCIUM" in the last 72 hours. No results for input(s): "LABPT", "INR" in the last 72 hours.  Physical Exam:  Neurologically intact ABD soft Neurovascular intact Sensation intact distally Intact pulses distally Dorsiflexion/Plantar flexion intact Incision: dressing C/D/I and no drainage No cellulitis present Compartment soft  Assessment/Plan:  1 Day Post-Op Procedure(s) (LRB): RIGHT TOTAL KNEE ARTHROPLASTY (Right) Advance diet Up with therapy D/C IV fluids Discharge home with home health today once cleared and if doing well with PT. Continue on '81mg'$  asa BID for DVT prevention.  Follow up in office 2 weeks post op.    [name] 06/28/2022, 8:06 AM

## 2022-06-30 ENCOUNTER — Other Ambulatory Visit (HOSPITAL_BASED_OUTPATIENT_CLINIC_OR_DEPARTMENT_OTHER): Payer: Self-pay

## 2022-06-30 ENCOUNTER — Other Ambulatory Visit (HOSPITAL_COMMUNITY): Payer: Self-pay

## 2022-06-30 DIAGNOSIS — F32A Depression, unspecified: Secondary | ICD-10-CM | POA: Diagnosis not present

## 2022-06-30 DIAGNOSIS — K449 Diaphragmatic hernia without obstruction or gangrene: Secondary | ICD-10-CM | POA: Diagnosis not present

## 2022-06-30 DIAGNOSIS — D509 Iron deficiency anemia, unspecified: Secondary | ICD-10-CM | POA: Diagnosis not present

## 2022-06-30 DIAGNOSIS — Z87891 Personal history of nicotine dependence: Secondary | ICD-10-CM | POA: Diagnosis not present

## 2022-06-30 DIAGNOSIS — R7303 Prediabetes: Secondary | ICD-10-CM | POA: Diagnosis not present

## 2022-06-30 DIAGNOSIS — M1990 Unspecified osteoarthritis, unspecified site: Secondary | ICD-10-CM | POA: Diagnosis not present

## 2022-06-30 DIAGNOSIS — Z9181 History of falling: Secondary | ICD-10-CM | POA: Diagnosis not present

## 2022-06-30 DIAGNOSIS — Z7982 Long term (current) use of aspirin: Secondary | ICD-10-CM | POA: Diagnosis not present

## 2022-06-30 DIAGNOSIS — Z471 Aftercare following joint replacement surgery: Secondary | ICD-10-CM | POA: Diagnosis not present

## 2022-06-30 DIAGNOSIS — E785 Hyperlipidemia, unspecified: Secondary | ICD-10-CM | POA: Diagnosis not present

## 2022-06-30 DIAGNOSIS — E559 Vitamin D deficiency, unspecified: Secondary | ICD-10-CM | POA: Diagnosis not present

## 2022-06-30 DIAGNOSIS — Z96651 Presence of right artificial knee joint: Secondary | ICD-10-CM | POA: Diagnosis not present

## 2022-06-30 DIAGNOSIS — K219 Gastro-esophageal reflux disease without esophagitis: Secondary | ICD-10-CM | POA: Diagnosis not present

## 2022-07-02 DIAGNOSIS — K449 Diaphragmatic hernia without obstruction or gangrene: Secondary | ICD-10-CM | POA: Diagnosis not present

## 2022-07-02 DIAGNOSIS — K219 Gastro-esophageal reflux disease without esophagitis: Secondary | ICD-10-CM | POA: Diagnosis not present

## 2022-07-02 DIAGNOSIS — R7303 Prediabetes: Secondary | ICD-10-CM | POA: Diagnosis not present

## 2022-07-02 DIAGNOSIS — M1990 Unspecified osteoarthritis, unspecified site: Secondary | ICD-10-CM | POA: Diagnosis not present

## 2022-07-02 DIAGNOSIS — Z471 Aftercare following joint replacement surgery: Secondary | ICD-10-CM | POA: Diagnosis not present

## 2022-07-02 DIAGNOSIS — Z87891 Personal history of nicotine dependence: Secondary | ICD-10-CM | POA: Diagnosis not present

## 2022-07-02 DIAGNOSIS — D509 Iron deficiency anemia, unspecified: Secondary | ICD-10-CM | POA: Diagnosis not present

## 2022-07-02 DIAGNOSIS — Z96651 Presence of right artificial knee joint: Secondary | ICD-10-CM | POA: Diagnosis not present

## 2022-07-02 DIAGNOSIS — Z9181 History of falling: Secondary | ICD-10-CM | POA: Diagnosis not present

## 2022-07-02 DIAGNOSIS — E785 Hyperlipidemia, unspecified: Secondary | ICD-10-CM | POA: Diagnosis not present

## 2022-07-02 DIAGNOSIS — Z7982 Long term (current) use of aspirin: Secondary | ICD-10-CM | POA: Diagnosis not present

## 2022-07-02 DIAGNOSIS — F32A Depression, unspecified: Secondary | ICD-10-CM | POA: Diagnosis not present

## 2022-07-02 DIAGNOSIS — E559 Vitamin D deficiency, unspecified: Secondary | ICD-10-CM | POA: Diagnosis not present

## 2022-07-03 ENCOUNTER — Encounter (HOSPITAL_COMMUNITY): Payer: Self-pay | Admitting: Orthopaedic Surgery

## 2022-07-03 ENCOUNTER — Other Ambulatory Visit (HOSPITAL_BASED_OUTPATIENT_CLINIC_OR_DEPARTMENT_OTHER): Payer: Self-pay

## 2022-07-03 MED ORDER — HYDROCODONE-ACETAMINOPHEN 5-325 MG PO TABS
ORAL_TABLET | ORAL | 0 refills | Status: DC
  Filled 2022-07-03: qty 40, 5d supply, fill #0

## 2022-07-04 ENCOUNTER — Other Ambulatory Visit (HOSPITAL_BASED_OUTPATIENT_CLINIC_OR_DEPARTMENT_OTHER): Payer: Self-pay

## 2022-07-04 DIAGNOSIS — K219 Gastro-esophageal reflux disease without esophagitis: Secondary | ICD-10-CM | POA: Diagnosis not present

## 2022-07-04 DIAGNOSIS — E559 Vitamin D deficiency, unspecified: Secondary | ICD-10-CM | POA: Diagnosis not present

## 2022-07-04 DIAGNOSIS — F32A Depression, unspecified: Secondary | ICD-10-CM | POA: Diagnosis not present

## 2022-07-04 DIAGNOSIS — Z471 Aftercare following joint replacement surgery: Secondary | ICD-10-CM | POA: Diagnosis not present

## 2022-07-04 DIAGNOSIS — Z96651 Presence of right artificial knee joint: Secondary | ICD-10-CM | POA: Diagnosis not present

## 2022-07-04 DIAGNOSIS — Z7982 Long term (current) use of aspirin: Secondary | ICD-10-CM | POA: Diagnosis not present

## 2022-07-04 DIAGNOSIS — R7303 Prediabetes: Secondary | ICD-10-CM | POA: Diagnosis not present

## 2022-07-04 DIAGNOSIS — E785 Hyperlipidemia, unspecified: Secondary | ICD-10-CM | POA: Diagnosis not present

## 2022-07-04 DIAGNOSIS — D509 Iron deficiency anemia, unspecified: Secondary | ICD-10-CM | POA: Diagnosis not present

## 2022-07-04 DIAGNOSIS — Z9181 History of falling: Secondary | ICD-10-CM | POA: Diagnosis not present

## 2022-07-04 DIAGNOSIS — M1990 Unspecified osteoarthritis, unspecified site: Secondary | ICD-10-CM | POA: Diagnosis not present

## 2022-07-04 DIAGNOSIS — K449 Diaphragmatic hernia without obstruction or gangrene: Secondary | ICD-10-CM | POA: Diagnosis not present

## 2022-07-04 DIAGNOSIS — Z87891 Personal history of nicotine dependence: Secondary | ICD-10-CM | POA: Diagnosis not present

## 2022-07-05 ENCOUNTER — Other Ambulatory Visit (HOSPITAL_BASED_OUTPATIENT_CLINIC_OR_DEPARTMENT_OTHER): Payer: Self-pay

## 2022-07-05 DIAGNOSIS — F32A Depression, unspecified: Secondary | ICD-10-CM | POA: Diagnosis not present

## 2022-07-05 DIAGNOSIS — K449 Diaphragmatic hernia without obstruction or gangrene: Secondary | ICD-10-CM | POA: Diagnosis not present

## 2022-07-05 DIAGNOSIS — D509 Iron deficiency anemia, unspecified: Secondary | ICD-10-CM | POA: Diagnosis not present

## 2022-07-05 DIAGNOSIS — Z7982 Long term (current) use of aspirin: Secondary | ICD-10-CM | POA: Diagnosis not present

## 2022-07-05 DIAGNOSIS — M1990 Unspecified osteoarthritis, unspecified site: Secondary | ICD-10-CM | POA: Diagnosis not present

## 2022-07-05 DIAGNOSIS — Z87891 Personal history of nicotine dependence: Secondary | ICD-10-CM | POA: Diagnosis not present

## 2022-07-05 DIAGNOSIS — R7303 Prediabetes: Secondary | ICD-10-CM | POA: Diagnosis not present

## 2022-07-05 DIAGNOSIS — K219 Gastro-esophageal reflux disease without esophagitis: Secondary | ICD-10-CM | POA: Diagnosis not present

## 2022-07-05 DIAGNOSIS — Z96651 Presence of right artificial knee joint: Secondary | ICD-10-CM | POA: Diagnosis not present

## 2022-07-05 DIAGNOSIS — E559 Vitamin D deficiency, unspecified: Secondary | ICD-10-CM | POA: Diagnosis not present

## 2022-07-05 DIAGNOSIS — Z471 Aftercare following joint replacement surgery: Secondary | ICD-10-CM | POA: Diagnosis not present

## 2022-07-05 DIAGNOSIS — E785 Hyperlipidemia, unspecified: Secondary | ICD-10-CM | POA: Diagnosis not present

## 2022-07-05 DIAGNOSIS — Z9181 History of falling: Secondary | ICD-10-CM | POA: Diagnosis not present

## 2022-07-06 ENCOUNTER — Other Ambulatory Visit (HOSPITAL_COMMUNITY): Payer: Self-pay

## 2022-07-06 DIAGNOSIS — R7303 Prediabetes: Secondary | ICD-10-CM | POA: Diagnosis not present

## 2022-07-06 DIAGNOSIS — M1990 Unspecified osteoarthritis, unspecified site: Secondary | ICD-10-CM | POA: Diagnosis not present

## 2022-07-06 DIAGNOSIS — E559 Vitamin D deficiency, unspecified: Secondary | ICD-10-CM | POA: Diagnosis not present

## 2022-07-06 DIAGNOSIS — Z96651 Presence of right artificial knee joint: Secondary | ICD-10-CM | POA: Diagnosis not present

## 2022-07-06 DIAGNOSIS — E785 Hyperlipidemia, unspecified: Secondary | ICD-10-CM | POA: Diagnosis not present

## 2022-07-06 DIAGNOSIS — Z87891 Personal history of nicotine dependence: Secondary | ICD-10-CM | POA: Diagnosis not present

## 2022-07-06 DIAGNOSIS — Z9181 History of falling: Secondary | ICD-10-CM | POA: Diagnosis not present

## 2022-07-06 DIAGNOSIS — Z471 Aftercare following joint replacement surgery: Secondary | ICD-10-CM | POA: Diagnosis not present

## 2022-07-06 DIAGNOSIS — D509 Iron deficiency anemia, unspecified: Secondary | ICD-10-CM | POA: Diagnosis not present

## 2022-07-06 DIAGNOSIS — F32A Depression, unspecified: Secondary | ICD-10-CM | POA: Diagnosis not present

## 2022-07-06 DIAGNOSIS — K449 Diaphragmatic hernia without obstruction or gangrene: Secondary | ICD-10-CM | POA: Diagnosis not present

## 2022-07-06 DIAGNOSIS — Z7982 Long term (current) use of aspirin: Secondary | ICD-10-CM | POA: Diagnosis not present

## 2022-07-06 DIAGNOSIS — K219 Gastro-esophageal reflux disease without esophagitis: Secondary | ICD-10-CM | POA: Diagnosis not present

## 2022-07-07 DIAGNOSIS — E785 Hyperlipidemia, unspecified: Secondary | ICD-10-CM | POA: Diagnosis not present

## 2022-07-07 DIAGNOSIS — Z9181 History of falling: Secondary | ICD-10-CM | POA: Diagnosis not present

## 2022-07-07 DIAGNOSIS — E559 Vitamin D deficiency, unspecified: Secondary | ICD-10-CM | POA: Diagnosis not present

## 2022-07-07 DIAGNOSIS — M1990 Unspecified osteoarthritis, unspecified site: Secondary | ICD-10-CM | POA: Diagnosis not present

## 2022-07-07 DIAGNOSIS — Z7982 Long term (current) use of aspirin: Secondary | ICD-10-CM | POA: Diagnosis not present

## 2022-07-07 DIAGNOSIS — R7303 Prediabetes: Secondary | ICD-10-CM | POA: Diagnosis not present

## 2022-07-07 DIAGNOSIS — D509 Iron deficiency anemia, unspecified: Secondary | ICD-10-CM | POA: Diagnosis not present

## 2022-07-07 DIAGNOSIS — Z96651 Presence of right artificial knee joint: Secondary | ICD-10-CM | POA: Diagnosis not present

## 2022-07-07 DIAGNOSIS — K219 Gastro-esophageal reflux disease without esophagitis: Secondary | ICD-10-CM | POA: Diagnosis not present

## 2022-07-07 DIAGNOSIS — Z87891 Personal history of nicotine dependence: Secondary | ICD-10-CM | POA: Diagnosis not present

## 2022-07-07 DIAGNOSIS — F32A Depression, unspecified: Secondary | ICD-10-CM | POA: Diagnosis not present

## 2022-07-07 DIAGNOSIS — K449 Diaphragmatic hernia without obstruction or gangrene: Secondary | ICD-10-CM | POA: Diagnosis not present

## 2022-07-07 DIAGNOSIS — Z471 Aftercare following joint replacement surgery: Secondary | ICD-10-CM | POA: Diagnosis not present

## 2022-07-10 ENCOUNTER — Other Ambulatory Visit (HOSPITAL_BASED_OUTPATIENT_CLINIC_OR_DEPARTMENT_OTHER): Payer: Self-pay

## 2022-07-10 DIAGNOSIS — Z96651 Presence of right artificial knee joint: Secondary | ICD-10-CM | POA: Diagnosis not present

## 2022-07-10 DIAGNOSIS — M1711 Unilateral primary osteoarthritis, right knee: Secondary | ICD-10-CM | POA: Diagnosis not present

## 2022-07-10 DIAGNOSIS — M25661 Stiffness of right knee, not elsewhere classified: Secondary | ICD-10-CM | POA: Diagnosis not present

## 2022-07-10 MED ORDER — HYDROCODONE-ACETAMINOPHEN 5-325 MG PO TABS
1.0000 | ORAL_TABLET | Freq: Four times a day (QID) | ORAL | 0 refills | Status: DC | PRN
  Filled 2022-07-10: qty 40, 10d supply, fill #0

## 2022-07-12 DIAGNOSIS — M25661 Stiffness of right knee, not elsewhere classified: Secondary | ICD-10-CM | POA: Diagnosis not present

## 2022-07-12 DIAGNOSIS — Z96651 Presence of right artificial knee joint: Secondary | ICD-10-CM | POA: Diagnosis not present

## 2022-07-13 DIAGNOSIS — M25661 Stiffness of right knee, not elsewhere classified: Secondary | ICD-10-CM | POA: Diagnosis not present

## 2022-07-13 DIAGNOSIS — Z96651 Presence of right artificial knee joint: Secondary | ICD-10-CM | POA: Diagnosis not present

## 2022-07-17 DIAGNOSIS — Z96651 Presence of right artificial knee joint: Secondary | ICD-10-CM | POA: Diagnosis not present

## 2022-07-17 DIAGNOSIS — M25661 Stiffness of right knee, not elsewhere classified: Secondary | ICD-10-CM | POA: Diagnosis not present

## 2022-07-19 DIAGNOSIS — M25661 Stiffness of right knee, not elsewhere classified: Secondary | ICD-10-CM | POA: Diagnosis not present

## 2022-07-19 DIAGNOSIS — Z96651 Presence of right artificial knee joint: Secondary | ICD-10-CM | POA: Diagnosis not present

## 2022-07-21 ENCOUNTER — Other Ambulatory Visit: Payer: Medicare Other

## 2022-07-21 DIAGNOSIS — Z96651 Presence of right artificial knee joint: Secondary | ICD-10-CM | POA: Diagnosis not present

## 2022-07-21 DIAGNOSIS — M25661 Stiffness of right knee, not elsewhere classified: Secondary | ICD-10-CM | POA: Diagnosis not present

## 2022-07-24 DIAGNOSIS — M25661 Stiffness of right knee, not elsewhere classified: Secondary | ICD-10-CM | POA: Diagnosis not present

## 2022-07-24 DIAGNOSIS — Z96651 Presence of right artificial knee joint: Secondary | ICD-10-CM | POA: Diagnosis not present

## 2022-07-26 DIAGNOSIS — M25661 Stiffness of right knee, not elsewhere classified: Secondary | ICD-10-CM | POA: Diagnosis not present

## 2022-07-26 DIAGNOSIS — Z96651 Presence of right artificial knee joint: Secondary | ICD-10-CM | POA: Diagnosis not present

## 2022-07-27 ENCOUNTER — Other Ambulatory Visit (HOSPITAL_BASED_OUTPATIENT_CLINIC_OR_DEPARTMENT_OTHER): Payer: Self-pay

## 2022-08-02 DIAGNOSIS — Z96651 Presence of right artificial knee joint: Secondary | ICD-10-CM | POA: Diagnosis not present

## 2022-08-02 DIAGNOSIS — M25661 Stiffness of right knee, not elsewhere classified: Secondary | ICD-10-CM | POA: Diagnosis not present

## 2022-08-04 DIAGNOSIS — M25661 Stiffness of right knee, not elsewhere classified: Secondary | ICD-10-CM | POA: Diagnosis not present

## 2022-08-04 DIAGNOSIS — Z96651 Presence of right artificial knee joint: Secondary | ICD-10-CM | POA: Diagnosis not present

## 2022-08-08 ENCOUNTER — Other Ambulatory Visit: Payer: Self-pay

## 2022-08-09 ENCOUNTER — Other Ambulatory Visit (HOSPITAL_BASED_OUTPATIENT_CLINIC_OR_DEPARTMENT_OTHER): Payer: Self-pay

## 2022-08-09 DIAGNOSIS — Z96641 Presence of right artificial hip joint: Secondary | ICD-10-CM | POA: Diagnosis not present

## 2022-08-11 ENCOUNTER — Other Ambulatory Visit (HOSPITAL_BASED_OUTPATIENT_CLINIC_OR_DEPARTMENT_OTHER): Payer: Self-pay

## 2022-08-15 ENCOUNTER — Other Ambulatory Visit (HOSPITAL_BASED_OUTPATIENT_CLINIC_OR_DEPARTMENT_OTHER): Payer: Self-pay

## 2022-08-15 ENCOUNTER — Other Ambulatory Visit: Payer: Self-pay | Admitting: Physician Assistant

## 2022-08-15 MED ORDER — ONDANSETRON 4 MG PO TBDP
ORAL_TABLET | ORAL | 2 refills | Status: DC
  Filled 2022-08-15: qty 30, 3d supply, fill #0
  Filled 2022-09-18: qty 30, 5d supply, fill #1
  Filled 2022-10-27: qty 30, 5d supply, fill #2

## 2022-08-17 DIAGNOSIS — M25661 Stiffness of right knee, not elsewhere classified: Secondary | ICD-10-CM | POA: Diagnosis not present

## 2022-08-17 DIAGNOSIS — Z96651 Presence of right artificial knee joint: Secondary | ICD-10-CM | POA: Diagnosis not present

## 2022-08-18 ENCOUNTER — Other Ambulatory Visit (HOSPITAL_BASED_OUTPATIENT_CLINIC_OR_DEPARTMENT_OTHER): Payer: Self-pay

## 2022-08-21 DIAGNOSIS — Z96651 Presence of right artificial knee joint: Secondary | ICD-10-CM | POA: Diagnosis not present

## 2022-08-21 DIAGNOSIS — M25661 Stiffness of right knee, not elsewhere classified: Secondary | ICD-10-CM | POA: Diagnosis not present

## 2022-08-23 DIAGNOSIS — Z96651 Presence of right artificial knee joint: Secondary | ICD-10-CM | POA: Diagnosis not present

## 2022-08-23 DIAGNOSIS — M25661 Stiffness of right knee, not elsewhere classified: Secondary | ICD-10-CM | POA: Diagnosis not present

## 2022-08-24 ENCOUNTER — Encounter: Payer: Self-pay | Admitting: Family Medicine

## 2022-08-24 ENCOUNTER — Ambulatory Visit (INDEPENDENT_AMBULATORY_CARE_PROVIDER_SITE_OTHER): Payer: PPO | Admitting: Family Medicine

## 2022-08-24 ENCOUNTER — Other Ambulatory Visit (HOSPITAL_BASED_OUTPATIENT_CLINIC_OR_DEPARTMENT_OTHER): Payer: Self-pay

## 2022-08-24 VITALS — BP 134/88 | HR 95 | Temp 97.8°F | Resp 18 | Wt 165.4 lb

## 2022-08-24 DIAGNOSIS — I700 Atherosclerosis of aorta: Secondary | ICD-10-CM | POA: Diagnosis not present

## 2022-08-24 DIAGNOSIS — R519 Headache, unspecified: Secondary | ICD-10-CM

## 2022-08-24 DIAGNOSIS — Z23 Encounter for immunization: Secondary | ICD-10-CM

## 2022-08-24 MED ORDER — DICLOFENAC SODIUM 75 MG PO TBEC
75.0000 mg | DELAYED_RELEASE_TABLET | Freq: Two times a day (BID) | ORAL | 0 refills | Status: DC
  Filled 2022-08-24: qty 30, 15d supply, fill #0

## 2022-08-24 NOTE — Progress Notes (Signed)
   Subjective:    Patient ID: [name], female    DOB: June 28, 1951, 72 y.o.   MRN: [id number]  HPI She is here for recheck on her headaches.  She has had headaches for the last several months and was seen in the past.  She did use ibuprofen 800 mg 3 times daily for approximately 7 to 10 days with relief of her symptoms but once the medicine was out of her system the headache reoccurred.  She describes it as an 8 retroorbital area that then spreads to the occipital area with occasional nausea but no vomiting, weakness numbness or tingling.  She describes it as dull and constant and there is questionable blurred vision with this but no double vision.  She also claims to be clumsy now. Recent x-rays show evidence of aortic atherosclerosis.  Review of Systems     Objective:   Physical Exam Alert and in no distress.  EOMI.  Other cranial nerves grossly intact.  Normal strength, reflexes and DTRs.  Negative pronator drift.  Cerebellar testing did demonstrate unsteadiness but she did not fall in any 1 direction.       Assessment & Plan:  New onset headache - Plan: MR Brain W Wo Contrast, diclofenac (VOLTAREN) 75 MG EC tablet  Intractable headache, unspecified chronicity pattern, unspecified headache type - Plan: MR Brain W Wo Contrast, diclofenac (VOLTAREN) 75 MG EC tablet  Aortic atherosclerosis (HCC)  Need for influenza vaccination - Plan: Flu Vaccine QUAD High Dose(Fluad), CANCELED: Flu vaccine HIGH DOSE PF (Fluzone High dose) Since her headaches really started in her 59s with no previous history at this time I think it is reasonable to get an MRI.  Based on this might refer to neurology if negative.  I will give her Voltaren to see if it will help with this until we can get the MRI done. She will follow-up with her cardiologist concerning the atherosclerosis and the best treatment since she has had difficulty with both Lipitor and Crestor causing myalgias.

## 2022-08-28 ENCOUNTER — Encounter (HOSPITAL_BASED_OUTPATIENT_CLINIC_OR_DEPARTMENT_OTHER): Payer: Self-pay | Admitting: Family

## 2022-08-28 ENCOUNTER — Ambulatory Visit (HOSPITAL_BASED_OUTPATIENT_CLINIC_OR_DEPARTMENT_OTHER): Payer: PPO | Admitting: Family

## 2022-08-28 VITALS — BP 144/80 | HR 91 | Ht 65.0 in | Wt 166.0 lb

## 2022-08-28 DIAGNOSIS — R030 Elevated blood-pressure reading, without diagnosis of hypertension: Secondary | ICD-10-CM | POA: Diagnosis not present

## 2022-08-28 DIAGNOSIS — I700 Atherosclerosis of aorta: Secondary | ICD-10-CM | POA: Diagnosis not present

## 2022-08-28 DIAGNOSIS — E785 Hyperlipidemia, unspecified: Secondary | ICD-10-CM

## 2022-08-28 NOTE — Patient Instructions (Addendum)
Medication Instructions:   We will consider either Repatha (which is a PCSK9 inhibitor) or Incliseron Marion Downer) for your high cholesterol.  Our pharmacy team will reach out to you about an appointment.   *If you need a refill on your cardiac medications before your next appointment, please call your pharmacy*  Follow-Up: At [facility], you and your health needs are our priority.  As part of our continuing mission to provide you with exceptional heart care, we have created designated Provider Care Teams.  These Care Teams include your primary Cardiologist (physician) and Advanced Practice Providers (APPs -  Physician Assistants and Nurse Practitioners) who all work together to provide you with the care you need, when you need it.  We recommend signing up for the patient portal called "MyChart".  Sign up information is provided on this After Visit Summary.  MyChart is used to connect with patients for Virtual Visits (Telemedicine).  Patients are able to view lab/test results, encounter notes, upcoming appointments, etc.  Non-urgent messages can be sent to your provider as well.   To learn more about what you can do with MyChart, go to [email or web address].    Your next appointment:   6 month(s)  Provider:   Skeet Latch, MD or Laurann Montana, NP    Other Instructions  Tips to Measure your Blood Pressure Correctly  To determine whether you have hypertension, a medical professional will take a blood pressure reading. How you prepare for the test, the position of your arm, and other factors can change a blood pressure reading by 10% or more. That could be enough to hide high blood pressure, start you on a drug you don't really need, or lead your doctor to incorrectly adjust your medications.  National and international guidelines offer specific instructions for measuring blood pressure. If a doctor, nurse, or medical assistant isn't doing it right, don't hesitate to ask him or  her to get with the guidelines.  Here's what you can do to ensure a correct reading:  Don't drink a caffeinated beverage or smoke during the 30 minutes before the test.  Sit quietly for five minutes before the test begins.  During the measurement, sit in a chair with your feet on the floor and your arm supported so your elbow is at about heart level.  The inflatable part of the cuff should completely cover at least 80% of your upper arm, and the cuff should be placed on bare skin, not over a shirt.  Don't talk during the measurement.  Have your blood pressure measured twice, with a brief break in between. If the readings are different by 5 points or more, have it done a third time.  In 2017, new guidelines from the [employer], the [employer] of Cardiology, and nine other health organizations lowered the diagnosis of high blood pressure to 130/80 mm Hg or higher for all adults. The guidelines also redefined the various blood pressure categories to now include normal, elevated, Stage 1 hypertension, Stage 2 hypertension, and hypertensive crisis (see "Blood pressure categories").  Blood pressure categories  Blood pressure category SYSTOLIC (upper number)  DIASTOLIC (lower number)  Normal Less than 120 mm Hg and Less than 80 mm Hg  Elevated 120-129 mm Hg and Less than 80 mm Hg  High blood pressure: Stage 1 hypertension 130-139 mm Hg or 80-89 mm Hg  High blood pressure: Stage 2 hypertension 140 mm Hg or higher or 90 mm Hg or higher  Hypertensive crisis (consult your  doctor immediately) Higher than 180 mm Hg and/or Higher than 120 mm Hg  Source: American Heart Association and American Stroke Association. For more on getting your blood pressure under control, buy Controlling Your Blood Pressure, a Special Health Report from [facility].  Blood Pressure Log   Date   Time  Blood Pressure  Example: Nov 1 9 AM 124/78

## 2022-08-28 NOTE — Progress Notes (Signed)
Office Visit    Patient Name: [name] Date of Encounter: 08/28/2022  PCP:  Denita Lung, MD   [place]  Cardiologist:  Skeet Latch, MD  Advanced Practice Provider:  No care team member to display Electrophysiologist:  None      Chief Complaint    [name] is a 72 y.o. female presents today for follow up of hyperlipidemia   Past Medical History    Past Medical History:  Diagnosis Date   Anemia    Anxiety    Arthritis    Complication of anesthesia    past hx. laryngospasm following 2 past surgeries, not recent   Depression    Exertional dyspnea 08/04/2019   GERD (gastroesophageal reflux disease)    Hiatal hernia 10/17/2019   Hyperlipidemia    Insomnia    periodically uses med.   Perforated sigmoid colon ([place]) 04/28/2014   during colonoscopy, "rupture colon"became septic, has colostomy and open surgical wound   Pneumonia    Pre-diabetes    Prediabetes 08/09/2017   Suicide attempt ([place])    Vitamin D deficiency    Past Surgical History:  Procedure Laterality Date   ABDOMINAL HYSTERECTOMY  4098   TAH/BSO   APPLICATION OF WOUND VAC  04/28/2014   Procedure: APPLICATION OF WOUND VAC;  Surgeon: Rolm Bookbinder, MD;  Location: [place];  Service: General;;   BLEPHAROPLASTY  2010   BREAST SURGERY  2001   Breast reduction   COLOSTOMY N/A 04/28/2014   Procedure: COLOSTOMY;  Surgeon: Rolm Bookbinder, MD;  Location: [facility];  Service: General;  Laterality: N/A;   COLOSTOMY REVISION N/A 04/28/2014   Procedure: COLON RESECTION SIGMOID;  Surgeon: Rolm Bookbinder, MD;  Location: [facility];  Service: General;  Laterality: N/A;   COLOSTOMY TAKEDOWN N/A 09/02/2014   Procedure: LAPAROSCOPIC COLOSTOMY REVERSAL;  Surgeon: [name], MD;  [facility];  Service: General;  Laterality: N/A;   COSMETIC SURGERY     DEBRIDEMENT TENNIS ELBOW     DILATION AND CURETTAGE OF UTERUS     x3, hysteroscopy   ELBOW SURGERY  2007   EYE  SURGERY     Blepharoplasty   LAPAROTOMY N/A 04/28/2014   Procedure: EXPLORATORY LAPAROTOMY,SIGMOID COLECTOMY;  Surgeon: Rolm Bookbinder, MD;  Location: [facility];  Service: General;  Laterality: N/A;   ORIF WRIST FRACTURE Left 02/19/2021   Procedure: Left wrist open reduction internal fixation and repair as indicated;  Surgeon: Iran Planas, MD;  Location: [facility];  Service: Orthopedics;  Laterality: Left;  29mn   REDUCTION MAMMAPLASTY Bilateral    scar and adhesion repair  06/01/15   and liposuction   TOTAL KNEE ARTHROPLASTY Right 06/27/2022   Procedure: RIGHT TOTAL KNEE ARTHROPLASTY;  Surgeon: DMelrose Nakayama MD;  [facility];  Service: Orthopedics;  Laterality: Right;   ULNAR TUNNEL RELEASE Left 2010    Allergies  Allergies  Allergen Reactions   Bee Venom Swelling    Yellow jackets   Statins Other (See Comments)    Muscle pains  CRESTOR, LIPITOR    History of Present Illness    Jo SCROGGSis a 72y.o. female with a hx of familial hyperlipidemia, prediabetes, GERD, hiatal hernia, aortic atherosclerosis last seen 06/16/22 via virtual visit for preoperative clearance.   Referred to Dr. ROval Linseyfor hyperlipidemia intolerant to Atorvastatin and Rosuvastatin. Coronary CTA 2021 with calcium score of 0. Last in office visit 10/2021 noted elevated liver enzymes which returned to normal by repeat  labs 03/02/22. CT chest 06-29-2022 revealed no ILD, moderate hiatal hernia, aortic atherosclerosis. 06/29/22 underwent right total knee arthroplasty.  Presents today independently. 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  She is concerned about her hereditary high cholesterol. She is intolerant to Atorvastatin, Rosuvastatin with myalgias. Reports no shortness of breath nor dyspnea on exertion. Reports no chest pain, pressure, or tightness. No edema, orthopnea, PND. Reports no palpitations.  Does note recent balance difficulties and forgetfulness, encouraged to discuss with  primary care. She is pleased with her progress in PT after knee replacement.   EKGs/Labs/Other Studies Reviewed:   The following studies were reviewed today:  CT 06-29-2022 IMPRESSION: 1. No evidence of interstitial lung disease. No lung masses or significant pulmonary nodules. 2. Moderate hiatal hernia. Mildly patulous and otherwise normal thoracic esophagus, suggesting chronic esophageal dysmotility. 3.  Aortic Atherosclerosis (ICD10-I70.0).   EKG:  EKG is  ordered today.  The ekg ordered today demonstrates NSR 91 bpm with sinus arrhythmia. No acute ST/T wave changes.   Recent Labs: 03/02/2022: ALT 12 06/14/2022: BUN 11; Creatinine, Ser 0.76; Hemoglobin 12.2; Platelets 348; Potassium 3.9; Sodium 132  Recent Lipid Panel    Component Value Date/Time   CHOL 235 (H) 10/31/2021 0953   TRIG 78 10/31/2021 0953   HDL 136 10/31/2021 0953   CHOLHDL 1.7 10/31/2021 0953   CHOLHDL 3.9 12/03/2020 0841   VLDL 44 (H) 12/03/2020 0841   LDLCALC 86 10/31/2021 0953   LDLCALC 183 (H) 08/08/2017 1629   LDLDIRECT 186.4 08/04/2010 0827    Home Medications   Current Meds  Medication Sig   diclofenac (VOLTAREN) 75 MG EC tablet Take 1 tablet (75 mg total) by mouth 2 (two) times daily.   esomeprazole (NEXIUM) 40 MG capsule Take 1 capsule (40 mg total) by mouth 2 (two) times daily before a meal.   eszopiclone (LUNESTA) 2 MG TABS tablet Take 1 tablet by mouth daily at bedtime.   Ferrous Sulfate (SLOW FE PO) Take 1 tablet by mouth daily.   gabapentin (NEURONTIN) 600 MG tablet Take 1 tablet (600 mg total) by mouth every evening.   lamoTRIgine (LAMICTAL) 150 MG tablet Take 1 tablet by mouth once a day   ondansetron (ZOFRAN-ODT) 4 MG disintegrating tablet Dissolve 1-2 tablets under the tongue every 6 hours as needed   prazosin (MINIPRESS) 2 MG capsule Take 1 capsule (2 mg total) by mouth at bedtime.   Vitamin D, Ergocalciferol, (DRISDOL) 1.25 MG (50000 UNIT) CAPS capsule Take 1 capsule by mouth every 7  days. (Patient taking differently: Take 50,000 Units by mouth every Thursday.)     Review of Systems      All other systems reviewed and are otherwise negative except as noted above.  Physical Exam    VS:  BP (!) 144/80   Pulse 91   Ht '5\' 5"'$  (1.651 m)   Wt 166 lb (75.3 kg)   LMP 08/07/1998   BMI 27.62 kg/m  , BMI Body mass index is 27.62 kg/m.  Wt Readings from Last 3 Encounters:  08/28/22 166 lb (75.3 kg)  08/24/22 165 lb 6.4 oz (75 kg)  06/27/22 160 lb 15 oz (73 kg)     GEN: Well nourished, well developed, in no acute distress. HEENT: normal. Neck: Supple, no JVD, carotid bruits, or masses. Cardiac: RRR, no murmurs, rubs, or gallops. No clubbing, cyanosis, edema.  Radials/PT 2+ and equal bilaterally.  Respiratory:  Respirations regular and unlabored, clear to auscultation bilaterally. GI: Soft, nontender, nondistended.  MS: No deformity or atrophy. Skin: Warm and dry, no rash. Neuro:  Strength and sensation are intact. Psych: Normal affect.  Assessment & Plan    HLD / Aortic atherosclerosis - 10/2021 total cholesterol 235, HDL 136, LDL 86. 06/2022 CT scan with aortic atherosclerosis. LDL goal <70. Intolerant to Rosuvastatin, Atorvastatin with myalgia. Low suspicion Zetia would get her to goal. In review of her insurance Repatha has better coverage than Nexlizet. She is also interested in Incliseron. Will refer to our pharmacy team to discuss Incliseron. If not covered by her insurance, she is agreeable to proceed with Repatha but prefers to utilize trial pen first.   Elevated BP without diagnosis of hypertension - Initial BP 148/92 and repeat 144/80. No prior history of hypertension. She is agreeable to purchase home arm cuff and monitor three times per week. Check in via MyChart in 2 weeks. If BP not <130/80, plan to add Amlodipine.   HYPERTENSION CONTROL Vitals:   08/28/22 1505 08/28/22 1602  BP: (!) 148/92 (!) 144/80    The patient's blood pressure is elevated above  target today.  In order to address the patient's elevated BP: Follow up with general cardiology has been recommended.; Blood pressure will be monitored at home to determine if medication changes need to be made.         Disposition: Follow up in 6 month(s) with Skeet Latch, MD or APP.  Signed, Loel Dubonnet, NP 08/28/2022, 8:03 PM [place]

## 2022-08-29 ENCOUNTER — Telehealth: Payer: Self-pay | Admitting: Family Medicine

## 2022-08-29 DIAGNOSIS — F332 Major depressive disorder, recurrent severe without psychotic features: Secondary | ICD-10-CM | POA: Diagnosis not present

## 2022-08-29 NOTE — Telephone Encounter (Signed)
Pt called and was checking on her MRI wanting to know if there was any update to when she was going to get her appt for that

## 2022-08-30 DIAGNOSIS — M25661 Stiffness of right knee, not elsewhere classified: Secondary | ICD-10-CM | POA: Diagnosis not present

## 2022-08-30 DIAGNOSIS — Z96651 Presence of right artificial knee joint: Secondary | ICD-10-CM | POA: Diagnosis not present

## 2022-08-31 ENCOUNTER — Telehealth: Payer: Self-pay | Admitting: Family Medicine

## 2022-08-31 DIAGNOSIS — M25661 Stiffness of right knee, not elsewhere classified: Secondary | ICD-10-CM | POA: Diagnosis not present

## 2022-08-31 DIAGNOSIS — Z96651 Presence of right artificial knee joint: Secondary | ICD-10-CM | POA: Diagnosis not present

## 2022-08-31 NOTE — Telephone Encounter (Signed)
Left message for patient to call back and schedule Medicare Annual Wellness Visit (AWV) either virtually or in office. Left  my [name] number [phone]   due 08/07/22 awvi per [facility] please schedule with Nurse Health Adviser   45 min for awv-i  in office appointments 30 min for awv-s & awv-i phone/virtual appointments

## 2022-09-01 ENCOUNTER — Other Ambulatory Visit: Payer: Self-pay

## 2022-09-01 ENCOUNTER — Other Ambulatory Visit (HOSPITAL_BASED_OUTPATIENT_CLINIC_OR_DEPARTMENT_OTHER): Payer: Self-pay

## 2022-09-02 ENCOUNTER — Other Ambulatory Visit (HOSPITAL_BASED_OUTPATIENT_CLINIC_OR_DEPARTMENT_OTHER): Payer: Self-pay

## 2022-09-04 ENCOUNTER — Encounter (HOSPITAL_BASED_OUTPATIENT_CLINIC_OR_DEPARTMENT_OTHER): Payer: Self-pay | Admitting: Cardiovascular Disease

## 2022-09-04 ENCOUNTER — Other Ambulatory Visit (HOSPITAL_BASED_OUTPATIENT_CLINIC_OR_DEPARTMENT_OTHER): Payer: Self-pay

## 2022-09-04 DIAGNOSIS — Z5181 Encounter for therapeutic drug level monitoring: Secondary | ICD-10-CM

## 2022-09-04 NOTE — Telephone Encounter (Signed)
BP log as requested 

## 2022-09-06 ENCOUNTER — Other Ambulatory Visit (HOSPITAL_BASED_OUTPATIENT_CLINIC_OR_DEPARTMENT_OTHER): Payer: Self-pay

## 2022-09-06 DIAGNOSIS — Z5181 Encounter for therapeutic drug level monitoring: Secondary | ICD-10-CM | POA: Diagnosis not present

## 2022-09-06 MED ORDER — VALSARTAN 160 MG PO TABS
160.0000 mg | ORAL_TABLET | Freq: Every day | ORAL | 3 refills | Status: DC
  Filled 2022-09-06: qty 90, 90d supply, fill #0

## 2022-09-06 MED ORDER — LAMOTRIGINE 150 MG PO TABS
150.0000 mg | ORAL_TABLET | Freq: Every day | ORAL | 4 refills | Status: DC
  Filled 2022-09-06: qty 30, 30d supply, fill #0
  Filled 2022-09-18 – 2022-09-19 (×2): qty 30, 30d supply, fill #1

## 2022-09-07 ENCOUNTER — Other Ambulatory Visit (HOSPITAL_BASED_OUTPATIENT_CLINIC_OR_DEPARTMENT_OTHER): Payer: Self-pay

## 2022-09-07 LAB — BASIC METABOLIC PANEL
BUN/Creatinine Ratio: 8 — ABNORMAL LOW (ref 12–28)
BUN: 8 mg/dL (ref 8–27)
CO2: 22 mmol/L (ref 20–29)
Calcium: 9.7 mg/dL (ref 8.7–10.3)
Chloride: 97 mmol/L (ref 96–106)
Creatinine, Ser: 0.97 mg/dL (ref 0.57–1.00)
Glucose: 100 mg/dL — ABNORMAL HIGH (ref 70–99)
Potassium: 4.7 mmol/L (ref 3.5–5.2)
Sodium: 135 mmol/L (ref 134–144)
eGFR: 63 mL/min/{1.73_m2} (ref >59)

## 2022-09-07 MED ORDER — PRAZOSIN HCL 2 MG PO CAPS
2.0000 mg | ORAL_CAPSULE | Freq: Every day | ORAL | 3 refills | Status: DC
  Filled 2022-09-07: qty 30, 30d supply, fill #0
  Filled 2022-10-08: qty 30, 30d supply, fill #1
  Filled 2022-11-08: qty 30, 30d supply, fill #2
  Filled 2022-12-04: qty 30, 30d supply, fill #3

## 2022-09-11 ENCOUNTER — Encounter (HOSPITAL_BASED_OUTPATIENT_CLINIC_OR_DEPARTMENT_OTHER): Payer: Self-pay

## 2022-09-11 ENCOUNTER — Other Ambulatory Visit (HOSPITAL_BASED_OUTPATIENT_CLINIC_OR_DEPARTMENT_OTHER): Payer: Self-pay

## 2022-09-11 DIAGNOSIS — R030 Elevated blood-pressure reading, without diagnosis of hypertension: Secondary | ICD-10-CM

## 2022-09-11 MED ORDER — AMLODIPINE BESYLATE 5 MG PO TABS
5.0000 mg | ORAL_TABLET | Freq: Every day | ORAL | 3 refills | Status: DC
  Filled 2022-09-11: qty 90, 90d supply, fill #0
  Filled 2022-12-04: qty 90, 90d supply, fill #1
  Filled 2023-03-09: qty 90, 90d supply, fill #2
  Filled 2023-06-03: qty 90, 90d supply, fill #3

## 2022-09-11 NOTE — Telephone Encounter (Signed)
BP log as requested 

## 2022-09-11 NOTE — Telephone Encounter (Signed)
Blood pressure not consistently at goal of <130/80. Please ensure checking blood pressure at least one hour after medications. Would recommend addition of Amlodipine '5mg'$  daily.   Loel Dubonnet, NP

## 2022-09-15 ENCOUNTER — Ambulatory Visit
Admission: RE | Admit: 2022-09-15 | Discharge: 2022-09-15 | Disposition: A | Discharge status: Home / Self Care | Payer: PPO | Source: Ambulatory Visit | Attending: Family Medicine | Admitting: Family Medicine

## 2022-09-15 DIAGNOSIS — R519 Headache, unspecified: Secondary | ICD-10-CM | POA: Diagnosis not present

## 2022-09-15 MED ORDER — GADOPICLENOL 0.5 MMOL/ML IV SOLN
7.5000 mL | Freq: Once | INTRAVENOUS | Status: AC | PRN
  Administered 2022-09-15: 7.5 mL via INTRAVENOUS

## 2022-09-16 ENCOUNTER — Ambulatory Visit (HOSPITAL_COMMUNITY)
Admission: EM | Admit: 2022-09-16 | Discharge: 2022-09-16 | Disposition: A | Discharge status: Home / Self Care | Payer: PPO | Attending: Family Medicine | Admitting: Family Medicine

## 2022-09-16 ENCOUNTER — Encounter (HOSPITAL_COMMUNITY): Payer: Self-pay | Admitting: *Deleted

## 2022-09-16 ENCOUNTER — Other Ambulatory Visit: Payer: Self-pay

## 2022-09-16 DIAGNOSIS — R002 Palpitations: Secondary | ICD-10-CM

## 2022-09-16 DIAGNOSIS — R739 Hyperglycemia, unspecified: Secondary | ICD-10-CM | POA: Diagnosis not present

## 2022-09-16 LAB — CBC
HCT: 37.8 % (ref 36.0–46.0)
Hemoglobin: 13.3 g/dL (ref 12.0–15.0)
MCH: 31.8 pg (ref 26.0–34.0)
MCHC: 35.2 g/dL (ref 30.0–36.0)
MCV: 90.4 fL (ref 80.0–100.0)
Platelets: 364 10*3/uL (ref 150–400)
RBC: 4.18 MIL/uL (ref 3.87–5.11)
RDW: 13.6 % (ref 11.5–15.5)
WBC: 8 10*3/uL (ref 4.0–10.5)
nRBC: 0 % (ref 0.0–0.2)

## 2022-09-16 LAB — BASIC METABOLIC PANEL
Anion gap: 15 (ref 5–15)
BUN: 15 mg/dL (ref 8–23)
CO2: 22 mmol/L (ref 22–32)
Calcium: 9.3 mg/dL (ref 8.9–10.3)
Chloride: 92 mmol/L — ABNORMAL LOW (ref 98–111)
Creatinine, Ser: 1.32 mg/dL — ABNORMAL HIGH (ref 0.44–1.00)
GFR, Estimated: 43 mL/min — ABNORMAL LOW (ref >60)
Glucose, Bld: 133 mg/dL — ABNORMAL HIGH (ref 70–99)
Potassium: 4.6 mmol/L (ref 3.5–5.1)
Sodium: 129 mmol/L — ABNORMAL LOW (ref 135–145)

## 2022-09-16 LAB — CBG MONITORING, ED: Glucose-Capillary: 206 mg/dL — ABNORMAL HIGH (ref 70–99)

## 2022-09-16 LAB — HEMOGLOBIN A1C
Hgb A1c MFr Bld: 5.5 % (ref 4.8–5.6)
Mean Plasma Glucose: 111.15 mg/dL

## 2022-09-16 LAB — TSH: TSH: 4.043 u[IU]/mL (ref 0.350–4.500)

## 2022-09-16 MED ORDER — METOPROLOL SUCCINATE ER 25 MG PO TB24: 25.0000 mg | ORAL_TABLET | Freq: Every day | ORAL | 0 refills | Status: DC

## 2022-09-16 NOTE — ED Triage Notes (Signed)
PT reports palpitations and hypertension.

## 2022-09-16 NOTE — Discharge Instructions (Addendum)
You have had labs (blood tests) sent today. We will call you with any significant abnormalities or if there is need to begin or change treatment or pursue further follow up.  You may also review your test results online through [facility]. If you do not have a MyChart account, instructions to sign up should be on your discharge paperwork.

## 2022-09-18 ENCOUNTER — Other Ambulatory Visit (HOSPITAL_BASED_OUTPATIENT_CLINIC_OR_DEPARTMENT_OTHER): Payer: Self-pay

## 2022-09-18 ENCOUNTER — Encounter: Payer: Self-pay | Admitting: Family Medicine

## 2022-09-18 ENCOUNTER — Other Ambulatory Visit: Payer: Self-pay

## 2022-09-18 NOTE — Telephone Encounter (Signed)
Okay to schedule with TR in April as she prefers to see her.   Labs were stable 09/06/22 while taking Valsartan. Suspect more recent increase could be related to dehydration. Dehydration could also cause palpitations. Would recommend increasing fluid intake with repeat labs (BMP) in one week.  Metoprolol helps to relax the heart and prevent palpitations.   Blood pressure is labile at home - may be worthwhile for her to have nurse visit for BP check at time of labs.  Loel Dubonnet, NP

## 2022-09-18 NOTE — ED Provider Notes (Signed)
[name]   [id number] 09/16/22 Arrival Time: [id number]  ASSESSMENT & PLAN:  1. Elevated blood sugar   2. Palpitations    Mild current pallpatations without CP/SOB.  ECG: Performed today and interpreted by me: sinus tachycardia; approx 110bpm; no STEMI.  She is very surprised regarding elevated blood sugar. HgA1c WNL. Na is low and Cr is elevated; recommend she recheck in a few days. Ensure adequate hydration. May return here. TSH is getting close to the high end of normal. May follow with PCP.  Results for orders placed or performed during the hospital encounter of [id number]  Basic metabolic panel  Result Value Ref Range   Sodium 129 (L) 135 - 145 mmol/L   Potassium 4.6 3.5 - 5.1 mmol/L   Chloride 92 (L) 98 - 111 mmol/L   CO2 22 22 - 32 mmol/L   Glucose, Bld 133 (H) 70 - 99 mg/dL   BUN 15 8 - 23 mg/dL   Creatinine, Ser 1.32 (H) 0.44 - 1.00 mg/dL   Calcium 9.3 8.9 - 10.3 mg/dL   GFR, Estimated 43 (L) >60 mL/min   Anion gap 15 5 - 15  CBC  Result Value Ref Range   WBC 8.0 4.0 - 10.5 K/uL   RBC 4.18 3.87 - 5.11 MIL/uL   Hemoglobin 13.3 12.0 - 15.0 g/dL   HCT 37.8 36.0 - 46.0 %   MCV 90.4 80.0 - 100.0 fL   MCH 31.8 26.0 - 34.0 pg   MCHC 35.2 30.0 - 36.0 g/dL   RDW 13.6 11.5 - 15.5 %   Platelets 364 150 - 400 K/uL   nRBC 0.0 0.0 - 0.2 %  TSH  Result Value Ref Range   TSH 4.043 0.350 - 4.500 uIU/mL  Hemoglobin A1c  Result Value Ref Range   Hgb A1c MFr Bld 5.5 4.8 - 5.6 %   Mean Plasma Glucose 111.15 mg/dL  POC CBG monitoring  Result Value Ref Range   Glucose-Capillary 206 (H) 70 - 99 mg/dL   For palpitations, if needed: Meds ordered this encounter  Medications   metoprolol succinate (TOPROL-XL) 25 MG 24 hr tablet    Sig: Take 1 tablet (25 mg total) by mouth daily.    Dispense:  30 tablet    Refill:  0    Follow-up Information     Schedule an appointment as soon as possible for a visit  with Denita Lung, MD.   Specialty: Family Medicine Contact  information: [place] [address] [phone]         Schedule an appointment as soon as possible for a visit  with Skeet Latch, MD.   Specialty: Cardiology Contact information: [place] [address] [facility].   Specialty: Emergency Medicine Why: If your fast heart rates returns and is not helped by the medication given today  - or - if you experience any chest pain or shortness of breath. Contact information: [address] [id number] [place] [phone]                 Chest pain precautions given. Reviewed expectations re: course of current medical issues. Questions answered. Outlined signs and symptoms indicating need for more acute intervention. Patient verbalized understanding. After Visit Summary given.   SUBJECTIVE:  History from: patient. [name] is a 72 y.o. female who presents with complaint of intermittent palpitations;  unsure of when she first noted these. H/O hypertension. Denies associated CP/SOB/n/n/diaphoresis. No new medications. Social History   Tobacco Use  Smoking Status Former   Packs/day: 0.50   Types: Cigarettes   Quit date: 09/01/2003   Years since quitting: 19.0  Smokeless Tobacco Never  Tobacco Comments   occasional  Denies recreational drug use. Denies: claudication, exertional chest pressure/discomfort, fatigue, lower extremity edema, near-syncope, orthopnea, paroxysmal nocturnal dyspnea, and syncope. No recent illnesses. No new medications. Does see cardiology.  Social History   Tobacco Use  Smoking Status Former   Packs/day: 0.50   Types: Cigarettes   Quit date: 09/01/2003   Years since quitting: 19.0  Smokeless Tobacco Never  Tobacco Comments   occasional   Social History   Substance and Sexual Activity  Alcohol Use Yes   Alcohol/week: 14.0  standard drinks of alcohol   Types: 14 Cans of beer per week   Comment: occas.   Reviewed by me: Past Medical History:  Diagnosis Date   Anemia    Anxiety    Arthritis    Complication of anesthesia    past hx. laryngospasm following 2 past surgeries, not recent   Depression    Exertional dyspnea 08/04/2019   GERD (gastroesophageal reflux disease)    Hiatal hernia 10/17/2019   Hyperlipidemia    Insomnia    periodically uses med.   Perforated sigmoid colon ([place]) 04/28/2014   during colonoscopy, "rupture colon"became septic, has colostomy and open surgical wound   Pneumonia    Pre-diabetes    Prediabetes 08/09/2017   Suicide attempt ([place])    Vitamin D deficiency       OBJECTIVE:  Vitals:   09/16/22 1303  BP: 116/67  Pulse: (!) 130  Resp: 20  Temp: 97.8 F (36.6 C)  SpO2: 100%    Recheck P: 108  General appearance: alert, oriented, no acute distress Eyes: PERRLA; EOMI; conjunctivae normal HENT: normocephalic; atraumatic Neck: supple with FROM Lungs: without labored respirations; speaks full sentences without difficulty; CTAB Heart: regular and tachycardic Chest Wall: without tenderness to palpation Abdomen: soft, non-tender; no guarding or rebound tenderness Extremities: without edema; without calf swelling or tenderness; symmetrical without gross deformities Skin: warm and dry; without rash or lesions Neuro: normal gait Psychological: alert and cooperative; normal mood and affect  Labs: Results for orders placed or performed during the hospital encounter of [id number]  Basic metabolic panel  Result Value Ref Range   Sodium 129 (L) 135 - 145 mmol/L   Potassium 4.6 3.5 - 5.1 mmol/L   Chloride 92 (L) 98 - 111 mmol/L   CO2 22 22 - 32 mmol/L   Glucose, Bld 133 (H) 70 - 99 mg/dL   BUN 15 8 - 23 mg/dL   Creatinine, Ser 1.32 (H) 0.44 - 1.00 mg/dL   Calcium 9.3 8.9 - 10.3 mg/dL   GFR, Estimated 43 (L) >60 mL/min   Anion gap 15 5 - 15  CBC  Result Value Ref  Range   WBC 8.0 4.0 - 10.5 K/uL   RBC 4.18 3.87 - 5.11 MIL/uL   Hemoglobin 13.3 12.0 - 15.0 g/dL   HCT 37.8 36.0 - 46.0 %   MCV 90.4 80.0 - 100.0 fL   MCH 31.8 26.0 - 34.0 pg   MCHC 35.2 30.0 - 36.0 g/dL   RDW 13.6 11.5 - 15.5 %   Platelets 364 150 - 400 K/uL   nRBC 0.0 0.0 - 0.2 %  TSH  Result Value Ref Range  TSH 4.043 0.350 - 4.500 uIU/mL  Hemoglobin A1c  Result Value Ref Range   Hgb A1c MFr Bld 5.5 4.8 - 5.6 %   Mean Plasma Glucose 111.15 mg/dL  POC CBG monitoring  Result Value Ref Range   Glucose-Capillary 206 (H) 70 - 99 mg/dL   Labs Reviewed  BASIC METABOLIC PANEL - Abnormal; Notable for the following components:      Result Value   Sodium 129 (*)    Chloride 92 (*)    Glucose, Bld 133 (*)    Creatinine, Ser 1.32 (*)    GFR, Estimated 43 (*)    All other components within normal limits  CBG MONITORING, ED - Abnormal; Notable for the following components:   Glucose-Capillary 206 (*)    All other components within normal limits  CBC  TSH  HEMOGLOBIN A1C    Imaging: No results found.   Allergies  Allergen Reactions   Bee Venom Swelling    Yellow jackets   Statins Other (See Comments)    Muscle pains  CRESTOR, LIPITOR    Past Medical History:  Diagnosis Date   Anemia    Anxiety    Arthritis    Complication of anesthesia    past hx. laryngospasm following 2 past surgeries, not recent   Depression    Exertional dyspnea 08/04/2019   GERD (gastroesophageal reflux disease)    Hiatal hernia 10/17/2019   Hyperlipidemia    Insomnia    periodically uses med.   Perforated sigmoid colon ([place]) 04/28/2014   during colonoscopy, "rupture colon"became septic, has colostomy and open surgical wound   Pneumonia    Pre-diabetes    Prediabetes 08/09/2017   Suicide attempt ([place])    Vitamin D deficiency    Social History   Socioeconomic History   Marital status: Divorced    Spouse name: Not on file   Number of children: 2   Years of education: Not on file    Highest education level: Not on file  Occupational History   Occupation: retired  Tobacco Use   Smoking status: Former    Packs/day: 0.50    Types: Cigarettes    Quit date: 09/01/2003    Years since quitting: 19.0   Smokeless tobacco: Never   Tobacco comments:    occasional  Vaping Use   Vaping Use: Every day   Substances: Nicotine, Nicotine-salt  Substance and Sexual Activity   Alcohol use: Yes    Alcohol/week: 14.0 standard drinks of alcohol    Types: 14 Cans of beer per week    Comment: occas.   Drug use: Not Currently    Types: Marijuana   Sexual activity: Not Currently    Birth control/protection: Surgical    Comment: TAH/BSO  Other Topics Concern   Not on file  Social History Narrative   College Degree   Registered Nurse   Exercises      Social Determinants of Health   Financial Resource Strain: Not on file  Food Insecurity: No Food Insecurity (06/27/2022)   Hunger Vital Sign    Worried About Running Out of Food in the Last Year: Never true    Ran Out of Food in the Last Year: Never true  Transportation Needs: No Transportation Needs (06/27/2022)   PRAPARE - Hydrologist (Medical): No    Lack of Transportation (Non-Medical): No  Physical Activity: Not on file  Stress: Not on file  Social Connections: Not on file  Intimate Partner Violence: Not  At Risk (06/27/2022)   Humiliation, Afraid, Rape, and Kick questionnaire    Fear of Current or Ex-Partner: No    Emotionally Abused: No    Physically Abused: No    Sexually Abused: No   Family History  Problem Relation Age of Onset   Hyperlipidemia Father    Hypertension Father    Diabetes Father    Cancer Father        History of bladder cancer   Heart disease Father    Other Father        4 ft intestinal removed-had gangrene in gallbladder, then spread to intestines   Heart attack Father    Hypertension Mother    Diabetes Mother    Heart disease Maternal Grandmother     Stroke Maternal Grandfather    Hyperlipidemia Brother    Cancer Paternal Grandmother        uterine   Cancer Paternal Grandfather        pancreatic cancer   Esophageal cancer Sister    Past Surgical History:  Procedure Laterality Date   ABDOMINAL HYSTERECTOMY  [id number]   TAH/BSO   APPLICATION OF WOUND VAC  04/28/2014   Procedure: APPLICATION OF WOUND VAC;  Surgeon: Rolm Bookbinder, MD;  Location: [place] [facility];  Service: General;;   BLEPHAROPLASTY  2010   BREAST SURGERY  2001   Breast reduction   COLOSTOMY N/A 04/28/2014   Procedure: COLOSTOMY;  Surgeon: Rolm Bookbinder, MD;  Location: [facility];  Service: General;  Laterality: N/A;   COLOSTOMY REVISION N/A 04/28/2014   Procedure: COLON RESECTION SIGMOID;  Surgeon: Rolm Bookbinder, MD;  Location: [place];  Service: General;  Laterality: N/A;   COLOSTOMY TAKEDOWN N/A 09/02/2014   Procedure: LAPAROSCOPIC COLOSTOMY REVERSAL;  Surgeon: [name], MD;  [facility];  Service: General;  Laterality: N/A;   COSMETIC SURGERY     DEBRIDEMENT TENNIS ELBOW     DILATION AND CURETTAGE OF UTERUS     x3, hysteroscopy   ELBOW SURGERY  2007   EYE SURGERY     Blepharoplasty   LAPAROTOMY N/A 04/28/2014   Procedure: EXPLORATORY LAPAROTOMY,SIGMOID COLECTOMY;  Surgeon: Rolm Bookbinder, MD;  Location: [facility];  Service: General;  Laterality: N/A;   ORIF WRIST FRACTURE Left 02/19/2021   Procedure: Left wrist open reduction internal fixation and repair as indicated;  Surgeon: Iran Planas, MD;  Location: [place] [facility];  Service: Orthopedics;  Laterality: Left;  79mn   REDUCTION MAMMAPLASTY Bilateral    scar and adhesion repair  06/01/15   and liposuction   TOTAL KNEE ARTHROPLASTY Right 06/27/2022   Procedure: RIGHT TOTAL KNEE ARTHROPLASTY;  Surgeon: DMelrose Nakayama MD;  [facility];  Service: Orthopedics;  Laterality: Right;   ULNAR TUNNEL RELEASE Left 2010      HVanessa Kick MD 09/18/22 [phone]

## 2022-09-18 NOTE — Telephone Encounter (Signed)
Please review message and patient labs from 2/10.

## 2022-09-18 NOTE — Addendum Note (Signed)
Addended by: Gerald Stabs on: 09/18/2022 10:46 AM   Modules accepted: Orders

## 2022-09-18 NOTE — Telephone Encounter (Signed)
Please advise 

## 2022-09-19 ENCOUNTER — Ambulatory Visit (INDEPENDENT_AMBULATORY_CARE_PROVIDER_SITE_OTHER): Payer: PPO | Admitting: Family Medicine

## 2022-09-19 ENCOUNTER — Other Ambulatory Visit (HOSPITAL_BASED_OUTPATIENT_CLINIC_OR_DEPARTMENT_OTHER): Payer: Self-pay

## 2022-09-19 ENCOUNTER — Ambulatory Visit: Payer: PPO

## 2022-09-19 ENCOUNTER — Other Ambulatory Visit: Payer: Self-pay | Admitting: Internal Medicine

## 2022-09-19 VITALS — BP 122/84 | HR 90 | Wt 169.6 lb

## 2022-09-19 DIAGNOSIS — R000 Tachycardia, unspecified: Secondary | ICD-10-CM | POA: Diagnosis not present

## 2022-09-19 DIAGNOSIS — R519 Headache, unspecified: Secondary | ICD-10-CM

## 2022-09-19 DIAGNOSIS — I10 Essential (primary) hypertension: Secondary | ICD-10-CM

## 2022-09-19 NOTE — Progress Notes (Signed)
   Subjective:    Patient ID: [name], female    DOB: October 18, 1950, 72 y.o.   MRN: [id number]  HPI She is here for an interval relation.  She was recently seen in urgent care center for evaluation of tachycardia.  EKG did show tachycardia.  She was given a beta-blocker and referral to cardiology.  The blood work did show some abnormalities that need to be followed up on.  She plans to have blood drawn at the cardiology office.  She also continues to have difficulty with headaches.  She did get some relief from using ibuprofen but when she stopped using it, the headache came back.  She also has concerns over starting back on valsartan.  I explained that the slight elevation of her creatinine is not necessarily related to the valsartan.     Review of Systems     Objective:   Physical Exam Alert and in no distress otherwise not examined       Assessment & Plan:  New onset headache  Hypertension, unspecified type  Tachycardia Appointment already made for neurology evaluation of the headache.  She will follow-up with cardiology concerning the tachycardia.  Recommend she start back on the valsartan.  She plans to follow-up concerning her abnormalities with electrolytes with cardiology.  Explained that these will probably go away with better hydration.  She was comfortable with that.

## 2022-09-20 DIAGNOSIS — M25661 Stiffness of right knee, not elsewhere classified: Secondary | ICD-10-CM | POA: Diagnosis not present

## 2022-09-21 ENCOUNTER — Encounter: Payer: Self-pay | Admitting: Neurology

## 2022-09-22 ENCOUNTER — Telehealth (HOSPITAL_BASED_OUTPATIENT_CLINIC_OR_DEPARTMENT_OTHER): Payer: Self-pay | Admitting: Cardiovascular Disease

## 2022-09-22 NOTE — Telephone Encounter (Signed)
Patient was a walk in toady---states she is having issues with her BP and pulse rate.  States she has an appointment next Wednesday and is going to bring her BP machine to compare reading with ours.  She wants to know what she should do if her heart rate continues to go above 139

## 2022-09-22 NOTE — Telephone Encounter (Signed)
Returned call to patient and provided the following recommendations.    "For heart rate >110 bpm at rest for >10 minutes may take an additional half tablet of Toprol once per day.    To prevent palpitations: Make sure you are adequately hydrated.  Avoid and/or limit caffeine containing beverages like soda or tea. Exercise regularly.  Manage stress well. Some over the counter medications can cause palpitations such as Benadryl, AdvilPM, TylenolPM. Regular Advil or Tylenol do not cause palpitations.     Loel Dubonnet, NP"

## 2022-09-22 NOTE — Telephone Encounter (Signed)
Do you want to give her anything for PRN HR control until appointment Wednesday?

## 2022-09-22 NOTE — Telephone Encounter (Signed)
For heart rate >110 bpm at rest for >10 minutes may take an additional half tablet of Toprol once per day.   To prevent palpitations: Make sure you are adequately hydrated.  Avoid and/or limit caffeine containing beverages like soda or tea. Exercise regularly.  Manage stress well. Some over the counter medications can cause palpitations such as Benadryl, AdvilPM, TylenolPM. Regular Advil or Tylenol do not cause palpitations.    Loel Dubonnet, NP

## 2022-09-26 ENCOUNTER — Telehealth: Payer: Self-pay | Admitting: Pharmacist

## 2022-09-26 ENCOUNTER — Telehealth: Payer: Self-pay | Admitting: Family Medicine

## 2022-09-26 ENCOUNTER — Encounter: Payer: Self-pay | Admitting: Pharmacist

## 2022-09-26 ENCOUNTER — Ambulatory Visit: Payer: PPO | Attending: Cardiology | Admitting: Pharmacist

## 2022-09-26 VITALS — BP 124/74 | HR 71

## 2022-09-26 DIAGNOSIS — I700 Atherosclerosis of aorta: Secondary | ICD-10-CM | POA: Diagnosis not present

## 2022-09-26 DIAGNOSIS — E785 Hyperlipidemia, unspecified: Secondary | ICD-10-CM

## 2022-09-26 DIAGNOSIS — T466X5A Adverse effect of antihyperlipidemic and antiarteriosclerotic drugs, initial encounter: Secondary | ICD-10-CM | POA: Diagnosis not present

## 2022-09-26 DIAGNOSIS — M791 Myalgia, unspecified site: Secondary | ICD-10-CM

## 2022-09-26 NOTE — Progress Notes (Unsigned)
Patient ID: [name]                 DOB: 05/06/1951                    MRN: [id number]     HPI: [name] is a 72 y.o. female patient referred to lipid clinic by [name]. PMH is significant for aortic atherosclerosis, HLD, and statin intolerance.  Patient presents today in good spirits. Currently worried about her BP and has a nurse visit.   Form[place] worked for [employer]. Reports a family history of CAD. Coronary CT showed calcium score of 0 in 2021. Has had myalgias with rosuvastatin an atorvastatin. Has taken up vaping nicotine.  Last lipid panel 10/31/21.  Current Medications: N/A  Intolerances:  Atorvastatin Rosuvastatin  Risk Factors:  HLD  Family history? Nicotine vaping Aortic atherosclerosis  LDL goal: <70  Labs: TC 235, Trigs 78, HDL 136, LDL 86 (10/31/21)  Past Medical History:  Diagnosis Date   Anemia    Anxiety    Arthritis    Complication of anesthesia    past hx. laryngospasm following 2 past surgeries, not recent   Depression    Exertional dyspnea 08/04/2019   GERD (gastroesophageal reflux disease)    Hiatal hernia 10/17/2019   Hyperlipidemia    Insomnia    periodically uses med.   Perforated sigmoid colon ([place]) 04/28/2014   during colonoscopy, "rupture colon"became septic, has colostomy and open surgical wound   Pneumonia    Pre-diabetes    Prediabetes 08/09/2017   Suicide attempt ([place])    Vitamin D deficiency     Current Outpatient Medications on File Prior to Visit  Medication Sig Dispense Refill   amLODipine (NORVASC) 5 MG tablet Take 1 tablet (5 mg total) by mouth daily. 90 tablet 3   esomeprazole (NEXIUM) 40 MG capsule Take 1 capsule (40 mg total) by mouth 2 (two) times daily before a meal. 60 capsule 11   eszopiclone (LUNESTA) 2 MG TABS tablet Take 1 tablet by mouth daily at bedtime. 30 tablet 3   Ferrous Sulfate (SLOW FE PO) Take 1 tablet by mouth daily.     gabapentin (NEURONTIN) 600 MG  tablet Take 1 tablet (600 mg total) by mouth every evening. 90 tablet 3   lamoTRIgine (LAMICTAL) 150 MG tablet Take 1 tablet (150 mg total) by mouth daily. 30 tablet 4   metoprolol succinate (TOPROL-XL) 25 MG 24 hr tablet Take 1 tablet (25 mg total) by mouth daily. 30 tablet 0   ondansetron (ZOFRAN-ODT) 4 MG disintegrating tablet Dissolve 1-2 tablets under the tongue every 6 hours as needed 30 tablet 2   prazosin (MINIPRESS) 2 MG capsule Take 1 capsule (2 mg total) by mouth at bedtime. 30 capsule 3   Vitamin D, Ergocalciferol, (DRISDOL) 1.25 MG (50000 UNIT) CAPS capsule Take 1 capsule by mouth every 7 days. (Patient taking differently: Take 50,000 Units by mouth every Thursday.) 12 capsule 3   No current facility-administered medications on file prior to visit.    Allergies  Allergen Reactions   Bee Venom Swelling    Yellow jackets   Statins Other (See Comments)    Muscle pains  CRESTOR, LIPITOR    Assessment/Plan:  1. Hyperlipidemia - Patient LDL 86 which is above goal of <70 however lab nearly a year old. Motivated to lower cholesterol. Discussed Nexlizet, Repatha and Leqvio. Due to insurance coverage, recommended Repatha.  Using demo pen, educated patient  on mechanism of action, storage, site selection, administration, and possible adverse effects. Enrolled patient in [place] since concern regarding copay.  Will complete PA and recheck lipid panel in 2-3 months. Patient would like to update lipid panel now also to get new baseline.  Start Repatha 144m q 2 weeks Check lipid panel now and in 2-3 months Recheck as needed  CKarren [name] PharmD, B[name] [address]Phone: [phone] Fax: [phone]

## 2022-09-26 NOTE — Telephone Encounter (Signed)
Contacted Debbrah Alar to schedule their annual wellness visit. Appointment made for 10/03/22.  Barkley Boards AWV direct phone # [phone]

## 2022-09-26 NOTE — Patient Instructions (Addendum)
It was nice meeting you today  We would like your LDL (bad cholesterol) to be less than 70  We will start a new medication called Repatha which you would inject once every 2 weeks  Once you start the medication we will recheck your levels in about 3 months  I will complete the prior authorization for you and contact you when it is approved  Please message with any questions  Karren Cobble, PharmD, [place], [place], [place], [place], [place], [address] Phone: [phone] , Fax: [phone]

## 2022-09-27 ENCOUNTER — Telehealth: Payer: Self-pay

## 2022-09-27 ENCOUNTER — Encounter (HOSPITAL_BASED_OUTPATIENT_CLINIC_OR_DEPARTMENT_OTHER): Payer: Self-pay

## 2022-09-27 ENCOUNTER — Other Ambulatory Visit (HOSPITAL_COMMUNITY): Payer: Self-pay

## 2022-09-27 ENCOUNTER — Ambulatory Visit (INDEPENDENT_AMBULATORY_CARE_PROVIDER_SITE_OTHER): Payer: PPO

## 2022-09-27 VITALS — BP 134/80 | HR 81 | Ht 65.0 in | Wt 167.0 lb

## 2022-09-27 DIAGNOSIS — E785 Hyperlipidemia, unspecified: Secondary | ICD-10-CM

## 2022-09-27 DIAGNOSIS — I700 Atherosclerosis of aorta: Secondary | ICD-10-CM

## 2022-09-27 DIAGNOSIS — R030 Elevated blood-pressure reading, without diagnosis of hypertension: Secondary | ICD-10-CM

## 2022-09-27 NOTE — Progress Notes (Signed)
   Nurse Visit   Date of Encounter: 09/27/2022 ID: [name], [name] [birth year], MRN [id number]  PCP:  Denita Lung, MD   [facility] Cardiologist:  Skeet Latch, MD      Visit Details   VS:  LMP 08/07/1998  , BMI There is no height or weight on file to calculate BMI.  Wt Readings from Last 3 Encounters:  09/19/22 169 lb 9.6 oz (76.9 kg)  08/28/22 166 lb (75.3 kg)  08/24/22 165 lb 6.4 oz (75 kg)     Reason for visit: Home Blood pressure cuff accuracy check  Performed today: Vitals, Provider consulted:Dr. Harrell Gave , and Education Changes (medications, testing, etc.) : no changes, home blood pressure cuff runs about 10 points high, advised patient to check BP as she normally does and subtract 10 points from top number for more accurate readings!  Length of Visit: 10 minutes    Medications Adjustments/Labs and Tests Ordered: No orders of the defined types were placed in this encounter.  No orders of the defined types were placed in this encounter.    Signed, Gerald Stabs, RN  09/27/2022 12:52 PM

## 2022-09-27 NOTE — Patient Instructions (Signed)
Medication Instructions:  Your physician recommends that you continue on your current medications as directed. Please refer to the Current Medication list given to you today.  *If you need a refill on your cardiac medications before your next appointment, please call your pharmacy*   Lab Work: Lipid Panel and BMP today   If you have labs (blood work) drawn today and your tests are completely normal, you will receive your results only by: [place] (if you have MyChart) OR A paper copy in the mail If you have any lab test that is abnormal or we need to change your treatment, we will call you to review the results.  Follow-Up: At [facility], you and your health needs are our priority.  As part of our continuing mission to provide you with exceptional heart care, we have created designated Provider Care Teams.  These Care Teams include your primary Cardiologist (physician) and Advanced Practice Providers (APPs -  Physician Assistants and Nurse Practitioners) who all work together to provide you with the care you need, when you need it.  We recommend signing up for the patient portal called "MyChart".  Sign up information is provided on this After Visit Summary.  MyChart is used to connect with patients for Virtual Visits (Telemedicine).  Patients are able to view lab/test results, encounter notes, upcoming appointments, etc.  Non-urgent messages can be sent to your provider as well.   To learn more about what you can do with MyChart, go to [email or web address].    Your next appointment:   Follow up as scheduled with Dr. Oval Linsey   Other Instructions Tips to Measure your Blood Pressure Correctly- Your blood pressure cuff runs about 10 points high on the top! Subtract 10 points from home cuff reading for more accurate readings!   To determine whether you have hypertension, a medical professional will take a blood pressure reading. How you prepare for the test, the position of  your arm, and other factors can change a blood pressure reading by 10% or more. That could be enough to hide high blood pressure, start you on a drug you don't really need, or lead your doctor to incorrectly adjust your medications.  National and international guidelines offer specific instructions for measuring blood pressure. If a doctor, nurse, or medical assistant isn't doing it right, don't hesitate to ask him or her to get with the guidelines.  Here's what you can do to ensure a correct reading:  Don't drink a caffeinated beverage or smoke during the 30 minutes before the test.  Sit quietly for five minutes before the test begins.  During the measurement, sit in a chair with your feet on the floor and your arm supported so your elbow is at about heart level.  The inflatable part of the cuff should completely cover at least 80% of your upper arm, and the cuff should be placed on bare skin, not over a shirt.  Don't talk during the measurement.  Have your blood pressure measured twice, with a brief break in between. If the readings are different by 5 points or more, have it done a third time.  In 2017, new guidelines from the [place], the [employer] of Cardiology, and nine other health organizations lowered the diagnosis of high blood pressure to 130/80 mm Hg or higher for all adults. The guidelines also redefined the various blood pressure categories to now include normal, elevated, Stage 1 hypertension, Stage 2 hypertension, and hypertensive crisis (see "Blood pressure  categories").  Blood pressure categories  Blood pressure category SYSTOLIC (upper number)  DIASTOLIC (lower number)  Normal Less than 120 mm Hg and Less than 80 mm Hg  Elevated 120-129 mm Hg and Less than 80 mm Hg  High blood pressure: Stage 1 hypertension 130-139 mm Hg or 80-89 mm Hg  High blood pressure: Stage 2 hypertension 140 mm Hg or higher or 90 mm Hg or higher  Hypertensive crisis (consult  your doctor immediately) Higher than 180 mm Hg and/or Higher than 120 mm Hg  Source: American Heart Association and American Stroke Association. For more on getting your blood pressure under control, buy Controlling Your Blood Pressure, a Special Health Report from [facility].   Blood Pressure Log   Date   Time  Blood Pressure  Position  Example: Nov 1 9 AM 124/78 sitting

## 2022-09-27 NOTE — Telephone Encounter (Signed)
Pharmacy Patient Advocate Encounter   Received notification from [place] that prior authorization for REPATHA 140 MG/ML INJ is needed.    PA submitted on 09/27/22 Key [id number] Status is pending  Karie Soda, [place] Patient Advocate Specialist Direct Number: [phone] Fax: [phone]

## 2022-09-28 ENCOUNTER — Other Ambulatory Visit (HOSPITAL_BASED_OUTPATIENT_CLINIC_OR_DEPARTMENT_OTHER): Payer: Self-pay

## 2022-09-28 LAB — BASIC METABOLIC PANEL
BUN/Creatinine Ratio: 8 — ABNORMAL LOW (ref 12–28)
BUN: 7 mg/dL — ABNORMAL LOW (ref 8–27)
CO2: 21 mmol/L (ref 20–29)
Calcium: 9.5 mg/dL (ref 8.7–10.3)
Chloride: 89 mmol/L — ABNORMAL LOW (ref 96–106)
Creatinine, Ser: 0.86 mg/dL (ref 0.57–1.00)
Glucose: 112 mg/dL — ABNORMAL HIGH (ref 70–99)
Potassium: 4.9 mmol/L (ref 3.5–5.2)
Sodium: 125 mmol/L — ABNORMAL LOW (ref 134–144)
eGFR: 73 mL/min/{1.73_m2} (ref >59)

## 2022-09-28 LAB — LIPID PANEL
Chol/HDL Ratio: 2.7 ratio (ref 0.0–4.4)
Cholesterol, Total: 323 mg/dL — ABNORMAL HIGH (ref 100–199)
HDL: 119 mg/dL (ref >39)
LDL Chol Calc (NIH): 189 mg/dL — ABNORMAL HIGH (ref 0–99)
Triglycerides: 95 mg/dL (ref 0–149)
VLDL Cholesterol Cal: 15 mg/dL (ref 5–40)

## 2022-09-28 MED ORDER — REPATHA SURECLICK 140 MG/ML ~~LOC~~ SOAJ
1.0000 mL | SUBCUTANEOUS | 3 refills | Status: DC
  Filled 2022-09-28: qty 6, 84d supply, fill #0
  Filled 2022-12-17: qty 6, 84d supply, fill #1
  Filled 2023-03-11: qty 6, 84d supply, fill #2
  Filled 2023-05-28: qty 6, 84d supply, fill #3
  Filled 2023-06-11: qty 2, 28d supply, fill #3
  Filled 2023-06-11: qty 6, 84d supply, fill #3

## 2022-09-28 MED ORDER — LAMOTRIGINE 150 MG PO TABS
150.0000 mg | ORAL_TABLET | Freq: Every day | ORAL | 3 refills | Status: DC
  Filled 2022-09-28: qty 30, 30d supply, fill #0

## 2022-09-28 NOTE — Telephone Encounter (Signed)
Pharmacy Patient Advocate Encounter  Prior Authorization for REPATHA 140 MG/ML INJ has been approved.    Effective dates: 09/27/22 through 03/28/23  Karie Soda, [place] Patient Advocate Specialist Direct Number: [phone] Fax: [phone]

## 2022-09-28 NOTE — Addendum Note (Signed)
Addended by: Rollen Sox on: 09/28/2022 04:33 PM   Modules accepted: Orders

## 2022-09-29 ENCOUNTER — Other Ambulatory Visit (HOSPITAL_BASED_OUTPATIENT_CLINIC_OR_DEPARTMENT_OTHER): Payer: Self-pay

## 2022-10-03 ENCOUNTER — Ambulatory Visit (INDEPENDENT_AMBULATORY_CARE_PROVIDER_SITE_OTHER): Payer: PPO | Admitting: Family Medicine

## 2022-10-03 ENCOUNTER — Ambulatory Visit (INDEPENDENT_AMBULATORY_CARE_PROVIDER_SITE_OTHER): Payer: PPO

## 2022-10-03 ENCOUNTER — Encounter: Payer: Self-pay | Admitting: Family Medicine

## 2022-10-03 VITALS — BP 130/80 | HR 78 | Temp 97.4°F | Resp 18 | Wt 168.0 lb

## 2022-10-03 VITALS — Ht 65.0 in | Wt 168.0 lb

## 2022-10-03 DIAGNOSIS — Z0000 Encounter for general adult medical examination without abnormal findings: Secondary | ICD-10-CM | POA: Diagnosis not present

## 2022-10-03 DIAGNOSIS — E871 Hypo-osmolality and hyponatremia: Secondary | ICD-10-CM

## 2022-10-03 DIAGNOSIS — E222 Syndrome of inappropriate secretion of antidiuretic hormone: Secondary | ICD-10-CM

## 2022-10-03 NOTE — Progress Notes (Signed)
   Subjective:    Patient ID: [name], female    DOB: 1951/08/06, 72 y.o.   MRN: [id number]  HPI She is here for recheck.  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  She has tapered off of this.  She has not had any difficulty with weakness or fatigue.  Her main issue is the fact that she is retired and is bored.  She also is in the process of trying to find a psychiatrist outside the [facility] system for follow-up.   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  Strongly encouraged her to get involved in something that gives her purpose and goals in her life.

## 2022-10-03 NOTE — Patient Instructions (Signed)
[name] , Thank you for taking time to come for your Medicare Wellness Visit. I appreciate your ongoing commitment to your health goals. Please review the following plan we discussed and let me know if I can assist you in the future.   These are the goals we discussed:  Goals      Patient Stated     10/03/2022, no goals        This is a list of the screening recommended for you and due dates:  Health Maintenance  Topic Date Due   Complete foot exam   Never done   Yearly kidney health urinalysis for diabetes  Never done   Eye exam for diabetics  10/05/2022*   COVID-19 Vaccine (6 - 2023-24 season) 11/28/2022*   Hemoglobin A1C  03/17/2023   Yearly kidney function blood test for diabetes  09/28/2023   Medicare Annual Wellness Visit  10/04/2023   Mammogram  11/11/2023   Colon Cancer Screening  08/18/2024   DTaP/Tdap/Td vaccine (7 - Td or Tdap) 10/11/2031   Pneumonia Vaccine  Completed   Flu Shot  Completed   DEXA scan (bone density measurement)  Completed   Hepatitis C Screening: USPSTF Recommendation to screen - Ages 71-79 yo.  Completed   Zoster (Shingles) Vaccine  Completed   HPV Vaccine  Aged Out  *Topic was postponed. The date shown is not the original due date.    Advanced directives: Please bring a copy of your POA (Power of Attorney) and/or Living Will to your next appointment.   Conditions/risks identified: none  Next appointment: Follow up in one year for your annual wellness visit    Preventive Care 65 Years and Older, Female Preventive care refers to lifestyle choices and visits with your health care provider that can promote health and wellness. What does preventive care include? A yearly physical exam. This is also called an annual well check. Dental exams once or twice a year. Routine eye exams. Ask your health care provider how often you should have your eyes checked. Personal lifestyle choices, including: Daily care of your teeth and gums. Regular  physical activity. Eating a healthy diet. Avoiding tobacco and drug use. Limiting alcohol use. Practicing safe sex. Taking low-dose aspirin every day. Taking vitamin and mineral supplements as recommended by your health care provider. What happens during an annual well check? The services and screenings done by your health care provider during your annual well check will depend on your age, overall health, lifestyle risk factors, and family history of disease. Counseling  Your health care provider may ask you questions about your: Alcohol use. Tobacco use. Drug use. Emotional well-being. Home and relationship well-being. Sexual activity. Eating habits. History of falls. Memory and ability to understand (cognition). Work and work Statistician. Reproductive health. Screening  You may have the following tests or measurements: Height, weight, and BMI. Blood pressure. Lipid and cholesterol levels. These may be checked every 5 years, or more frequently if you are over 41 years old. Skin check. Lung cancer screening. You may have this screening every year starting at age 72 if you have a 30-pack-year history of smoking and currently smoke or have quit within the past 15 years. Fecal occult blood test (FOBT) of the stool. You may have this test every year starting at age 51. Flexible sigmoidoscopy or colonoscopy. You may have a sigmoidoscopy every 5 years or a colonoscopy every 10 years starting at age 79. Hepatitis C blood test. Hepatitis B blood test. Sexually transmitted  disease (STD) testing. Diabetes screening. This is done by checking your blood sugar (glucose) after you have not eaten for a while (fasting). You may have this done every 1-3 years. Bone density scan. This is done to screen for osteoporosis. You may have this done starting at age 38. Mammogram. This may be done every 1-2 years. Talk to your health care provider about how often you should have regular mammograms. Talk  with your health care provider about your test results, treatment options, and if necessary, the need for more tests. Vaccines  Your health care provider may recommend certain vaccines, such as: Influenza vaccine. This is recommended every year. Tetanus, diphtheria, and acellular pertussis (Tdap, Td) vaccine. You may need a Td booster every 10 years. Zoster vaccine. You may need this after age 85. Pneumococcal 13-valent conjugate (PCV13) vaccine. One dose is recommended after age 41. Pneumococcal polysaccharide (PPSV23) vaccine. One dose is recommended after age 39. Talk to your health care provider about which screenings and vaccines you need and how often you need them. This information is not intended to replace advice given to you by your health care provider. Make sure you discuss any questions you have with your health care provider. Document Released: 08/20/2015 Document Revised: 04/12/2016 Document Reviewed: 05/25/2015 Elsevier Interactive Patient Education  2017 [place] Prevention in the Home Falls can cause injuries. They can happen to people of all ages. There are many things you can do to make your home safe and to help prevent falls. What can I do on the outside of my home? Regularly fix the edges of walkways and driveways and fix any cracks. Remove anything that might make you trip as you walk through a door, such as a raised step or threshold. Trim any bushes or trees on the path to your home. Use bright outdoor lighting. Clear any walking paths of anything that might make someone trip, such as rocks or tools. Regularly check to see if handrails are loose or broken. Make sure that both sides of any steps have handrails. Any raised decks and porches should have guardrails on the edges. Have any leaves, snow, or ice cleared regularly. Use sand or salt on walking paths during winter. Clean up any spills in your garage right away. This includes oil or grease  spills. What can I do in the bathroom? Use night lights. Install grab bars by the toilet and in the tub and shower. Do not use towel bars as grab bars. Use non-skid mats or decals in the tub or shower. If you need to sit down in the shower, use a plastic, non-slip stool. Keep the floor dry. Clean up any water that spills on the floor as soon as it happens. Remove soap buildup in the tub or shower regularly. Attach bath mats securely with double-sided non-slip rug tape. Do not have throw rugs and other things on the floor that can make you trip. What can I do in the bedroom? Use night lights. Make sure that you have a light by your bed that is easy to reach. Do not use any sheets or blankets that are too big for your bed. They should not hang down onto the floor. Have a firm chair that has side arms. You can use this for support while you get dressed. Do not have throw rugs and other things on the floor that can make you trip. What can I do in the kitchen? Clean up any spills right away. Avoid walking  on wet floors. Keep items that you use a lot in easy-to-reach places. If you need to reach something above you, use a strong step stool that has a grab bar. Keep electrical cords out of the way. Do not use floor polish or wax that makes floors slippery. If you must use wax, use non-skid floor wax. Do not have throw rugs and other things on the floor that can make you trip. What can I do with my stairs? Do not leave any items on the stairs. Make sure that there are handrails on both sides of the stairs and use them. Fix handrails that are broken or loose. Make sure that handrails are as long as the stairways. Check any carpeting to make sure that it is firmly attached to the stairs. Fix any carpet that is loose or worn. Avoid having throw rugs at the top or bottom of the stairs. If you do have throw rugs, attach them to the floor with carpet tape. Make sure that you have a light switch at the  top of the stairs and the bottom of the stairs. If you do not have them, ask someone to add them for you. What else can I do to help prevent falls? Wear shoes that: Do not have high heels. Have rubber bottoms. Are comfortable and fit you well. Are closed at the toe. Do not wear sandals. If you use a stepladder: Make sure that it is fully opened. Do not climb a closed stepladder. Make sure that both sides of the stepladder are locked into place. Ask someone to hold it for you, if possible. Clearly mark and make sure that you can see: Any grab bars or handrails. First and last steps. Where the edge of each step is. Use tools that help you move around (mobility aids) if they are needed. These include: Canes. Walkers. Scooters. Crutches. Turn on the lights when you go into a dark area. Replace any light bulbs as soon as they burn out. Set up your furniture so you have a clear path. Avoid moving your furniture around. If any of your floors are uneven, fix them. If there are any pets around you, be aware of where they are. Review your medicines with your doctor. Some medicines can make you feel dizzy. This can increase your chance of falling. Ask your doctor what other things that you can do to help prevent falls. This information is not intended to replace advice given to you by your health care provider. Make sure you discuss any questions you have with your health care provider. Document Released: 05/20/2009 Document Revised: 12/30/2015 Document Reviewed: 08/28/2014 Elsevier Interactive Patient Education  2017 [employer].

## 2022-10-03 NOTE — Progress Notes (Signed)
I connected with  [name] on 10/03/22 by a audio enabled telemedicine application and verified that I am speaking with the correct person using two identifiers.  Patient Location: Home  Provider Location: Office/Clinic  I discussed the limitations of evaluation and management by telemedicine. The patient expressed understanding and agreed to proceed.  Subjective:   [name] is a 72 y.o. female who presents for an Initial Medicare Annual Wellness Visit.  Review of Systems     Cardiac Risk Factors include: advanced age (>26mn, >>63women);dyslipidemia     Objective:    Today's Vitals   10/03/22 1456  Weight: 168 lb (76.2 kg)  Height: '5\' 5"'$  (1.651 m)   Body mass index is 27.96 kg/m.     10/03/2022    3:00 PM 06/27/2022    5:00 PM 06/27/2022    5:47 AM 06/14/2022    2:11 PM 02/14/2021   12:01 PM 02/12/2021    6:53 PM 09/02/2014    8:00 PM  Advanced Directives  Does Patient Have a Medical Advance Directive? Yes No Yes Yes Yes Yes No  Type of AParamedicof [place]Living will  [place] will Living will;Healthcare Power of [place] will    Does patient want to make changes to medical advance directive?  No - Guardian declined No - Patient declined  No - Guardian declined    Copy of H[place]in Chart? No - copy requested  No - copy requested  No - copy requested    Would patient like information on creating a medical advance directive?  No - Patient declined     No - patient declined information    Current Medications (verified) Outpatient Encounter Medications as of 10/03/2022  Medication Sig   amLODipine (NORVASC) 5 MG tablet Take 1 tablet (5 mg total) by mouth daily.   esomeprazole (NEXIUM) 40 MG capsule Take 1 capsule (40 mg total) by mouth 2 (two) times daily before a meal.   eszopiclone (LUNESTA) 2 MG TABS tablet Take 1 tablet by mouth daily at  bedtime.   Evolocumab (REPATHA SURECLICK) 1XX123456MG/ML SOAJ Inject 140 mg into the skin every 14 (fourteen) days.   Ferrous Sulfate (SLOW FE PO) Take 1 tablet by mouth daily.   gabapentin (NEURONTIN) 600 MG tablet Take 1 tablet (600 mg total) by mouth every evening.   metoprolol succinate (TOPROL-XL) 25 MG 24 hr tablet Take 1 tablet (25 mg total) by mouth daily.   ondansetron (ZOFRAN-ODT) 4 MG disintegrating tablet Dissolve 1-2 tablets under the tongue every 6 hours as needed   prazosin (MINIPRESS) 2 MG capsule Take 1 capsule (2 mg total) by mouth at bedtime.   Vitamin D, Ergocalciferol, (DRISDOL) 1.25 MG (50000 UNIT) CAPS capsule Take 1 capsule by mouth every 7 days. (Patient taking differently: Take 50,000 Units by mouth every Thursday.)   lamoTRIgine (LAMICTAL) 150 MG tablet Take 1 tablet (150 mg total) by mouth daily. (Patient not taking: Reported on 10/03/2022)   lamoTRIgine (LAMICTAL) 150 MG tablet Take 1 tablet (150 mg total) by mouth daily. (Patient not taking: Reported on 10/03/2022)   No facility-administered encounter medications on file as of 10/03/2022.    Allergies (verified) Bee venom and Statins   History: Past Medical History:  Diagnosis Date   Anemia    Anxiety    Arthritis    Complication of anesthesia    past hx. laryngospasm following 2 past surgeries, not recent  Depression    Exertional dyspnea 08/04/2019   GERD (gastroesophageal reflux disease)    Hiatal hernia 10/17/2019   Hyperlipidemia    Insomnia    periodically uses med.   Perforated sigmoid colon ([place]) 04/28/2014   during colonoscopy, "rupture colon"became septic, has colostomy and open surgical wound   Pneumonia    Pre-diabetes    Prediabetes 08/09/2017   Suicide attempt ([place])    Vitamin D deficiency    Past Surgical History:  Procedure Laterality Date   ABDOMINAL HYSTERECTOMY  [id number]   TAH/BSO   APPLICATION OF WOUND VAC  04/28/2014   Procedure: APPLICATION OF WOUND VAC;  Surgeon: Rolm Bookbinder,  MD;  Location: [facility];  Service: General;;   BLEPHAROPLASTY  2010   BREAST SURGERY  2001   Breast reduction   COLOSTOMY N/A 04/28/2014   Procedure: COLOSTOMY;  Surgeon: Rolm Bookbinder, MD;  Location: [facility];  Service: General;  Laterality: N/A;   COLOSTOMY REVISION N/A 04/28/2014   Procedure: COLON RESECTION SIGMOID;  Surgeon: Rolm Bookbinder, MD;  Location: [facility];  Service: General;  Laterality: N/A;   COLOSTOMY TAKEDOWN N/A 09/02/2014   Procedure: LAPAROSCOPIC COLOSTOMY REVERSAL;  Surgeon: [name], MD;  Location: [facility];  Service: General;  Laterality: N/A;   COSMETIC SURGERY     DEBRIDEMENT TENNIS ELBOW     DILATION AND CURETTAGE OF UTERUS     x3, hysteroscopy   ELBOW SURGERY  2007   EYE SURGERY     Blepharoplasty   LAPAROTOMY N/A 04/28/2014   Procedure: EXPLORATORY LAPAROTOMY,SIGMOID COLECTOMY;  Surgeon: Rolm Bookbinder, MD;  Location: [facility];  Service: General;  Laterality: N/A;   ORIF WRIST FRACTURE Left 02/19/2021   Procedure: Left wrist open reduction internal fixation and repair as indicated;  Surgeon: Iran Planas, MD;  Location: [facility];  Service: Orthopedics;  Laterality: Left;  42mn   REDUCTION MAMMAPLASTY Bilateral    scar and adhesion repair  06/01/15   and liposuction   TOTAL KNEE ARTHROPLASTY Right 06/27/2022   Procedure: RIGHT TOTAL KNEE ARTHROPLASTY;  Surgeon: DMelrose Nakayama MD;  Location: [facility];  Service: Orthopedics;  Laterality: Right;   ULNAR TUNNEL RELEASE Left 2010   Family History  Problem Relation Age of Onset   Hyperlipidemia Father    Hypertension Father    Diabetes Father    Cancer Father        History of bladder cancer   Heart disease Father    Other Father        4 ft intestinal removed-had gangrene in gallbladder, then spread to intestines   Heart attack Father    Hypertension Mother    Diabetes Mother    Heart disease Maternal Grandmother    Stroke Maternal Grandfather    Hyperlipidemia Brother    Cancer Paternal Grandmother         uterine   Cancer Paternal Grandfather        pancreatic cancer   Esophageal cancer Sister    Social History   Socioeconomic History   Marital status: Divorced    Spouse name: Not on file   Number of children: 2   Years of education: Not on file   Highest education level: Not on file  Occupational History   Occupation: retired  Tobacco Use   Smoking status: Former    Packs/day: 0.50    Types: Cigarettes    Quit date: 09/01/2003    Years since quitting: 19.1   Smokeless tobacco: Never   Tobacco comments:  occasional  Vaping Use   Vaping Use: Every day   Substances: Nicotine, Nicotine-salt  Substance and Sexual Activity   Alcohol use: Yes    Alcohol/week: 14.0 standard drinks of alcohol    Types: 14 Cans of beer per week    Comment: occas.   Drug use: Not Currently    Types: Marijuana   Sexual activity: Not Currently    Birth control/protection: Surgical    Comment: TAH/BSO  Other Topics Concern   Not on file  Social History Narrative   College Degree   Registered Nurse   Exercises      Social Determinants of Health   Financial Resource Strain: Low Risk  (10/03/2022)   Overall Financial Resource Strain (CARDIA)    Difficulty of Paying Living Expenses: Not hard at all  Food Insecurity: No Food Insecurity (10/03/2022)   Hunger Vital Sign    Worried About Running Out of Food in the Last Year: Never true    Ran Out of Food in the Last Year: Never true  Transportation Needs: No Transportation Needs (10/03/2022)   PRAPARE - Hydrologist (Medical): No    Lack of Transportation (Non-Medical): No  Physical Activity: Sufficiently Active (10/03/2022)   Exercise Vital Sign    Days of Exercise per Week: 6 days    Minutes of Exercise per Session: 60 min  Stress: No Stress Concern Present (10/03/2022)   [place]    Feeling of Stress : Not at all  Social Connections: Not on file     Tobacco Counseling Counseling given: Not Answered Tobacco comments: occasional   Clinical Intake:  Pre-visit preparation completed: Yes  Pain : No/denies pain     Nutritional Status: BMI 25 -29 Overweight Nutritional Risks: None Diabetes: No  How often do you need to have someone help you when you read instructions, pamphlets, or other written materials from your doctor or pharmacy?: 1 - Never  Diabetic? no  Interpreter Needed?: No  Information entered by :: NAllen LPN   Activities of Daily Living    10/03/2022    3:07 PM 06/27/2022    5:00 PM  In your present state of health, do you have any difficulty performing the following activities:  Hearing? 0 0  Vision? 0 0  Difficulty concentrating or making decisions? 1 0  Comment trouble with memory   Walking or climbing stairs? 0 1  Dressing or bathing? 0 0  Doing errands, shopping? 0 0  Preparing Food and eating ? N   Using the Toilet? N   In the past six months, have you accidently leaked urine? N   Do you have problems with loss of bowel control? N   Managing your Medications? N   Managing your Finances? N   Housekeeping or managing your Housekeeping? N     Patient Care Team: Denita Lung, MD as PCP - General (Family Medicine) Skeet Latch, MD as PCP - Cardiology (Cardiology) Megan Salon, MD as Consulting Physician (Gynecology) Roseanne Kaufman, MD as Consulting Physician (Orthopedic Surgery)  Indicate any recent Medical Services you may have received from other than [facility] providers in the past year (date may be approximate).     Assessment:   This is a routine wellness examination for [name].  Hearing/Vision screen Vision Screening - Comments:: Regular eye exams, Dr. Ellie Lunch  Dietary issues and exercise activities discussed: Current Exercise Habits: Home exercise routine, Type of exercise: strength training/weights;calisthenics,  Time (Minutes): 60, Frequency (Times/Week): 6, Weekly  Exercise (Minutes/Week): 360   Goals Addressed             This Visit's Progress    Patient Stated       10/03/2022, no goals       Depression Screen    10/03/2022    3:02 PM 05/08/2022   11:31 AM 04/03/2022    4:14 PM 03/02/2022    2:16 PM 04/21/2019    1:53 PM 08/08/2017    4:41 PM 08/08/2017    3:21 PM  PHQ 2/9 Scores  PHQ - 2 Score 3 0 0 0 0    PHQ- 9 Score 6           Information is confidential and restricted. Go to Review Flowsheets to unlock data.    Fall Risk    10/03/2022    3:01 PM 08/24/2022    1:46 PM 08/08/2017    3:21 PM 04/14/2014    1:21 PM  [place] in the past year? 1 1  No  Comment knee gave out     Number falls in past yr: 1 1    Injury with Fall? 1 1    Comment broke some teeth     Risk for fall due to : Medication side effect History of fall(s)    Follow up Falls prevention discussed;Education provided;Falls evaluation completed Falls evaluation completed;Falls prevention discussed       Information is confidential and restricted. Go to Review Flowsheets to unlock data.    FALL RISK PREVENTION PERTAINING TO THE HOME:  Any stairs in or around the home? Yes  If so, are there any without handrails? No  Home free of loose throw rugs in walkways, pet beds, electrical cords, etc? Yes  Adequate lighting in your home to reduce risk of falls? Yes   ASSISTIVE DEVICES UTILIZED TO PREVENT FALLS:  Life alert? No  Use of a cane, walker or w/c? No  Grab bars in the bathroom? No  Shower chair or bench in shower? No  Elevated toilet seat or a handicapped toilet? No   TIMED UP AND GO:  Was the test performed? No .       Cognitive Function:        10/03/2022    3:09 PM  6CIT Screen  What Year? 0 points  What month? 0 points  What time? 0 points  Count back from 20 0 points  Months in reverse 2 points  Repeat phrase 0 points  Total Score 2 points    Immunizations Immunization History  Administered Date(s) Administered   COVID-19,  mRNA, vaccine(Comirnaty)12 years and older 05/29/2022   Fluad Quad(high Dose 65+) 04/26/2021, 08/24/2022   Influenza,inj,Quad PF,6+ Mos 04/14/2014   MMR 02/06/2008   PFIZER(Purple Top)SARS-COV-2 Vaccination 08/06/2019, 08/27/2019, 05/15/2020   Pfizer Covid-19 Vaccine Bivalent Booster 3yr & up 05/09/2021   Pneumococcal Conjugate-13 08/08/2017   Pneumococcal Polysaccharide-23 04/21/2019   Td 08/08/2007, 07/22/2008   Tdap 01/02/2008, 08/08/2017, 06/02/2018, 10/10/2021   Zoster Recombinat (Shingrix) 01/11/2018, 03/25/2018    TDAP status: Up to date  Flu Vaccine status: Up to date  Pneumococcal vaccine status: Up to date  Covid-19 vaccine status: Completed vaccines  Qualifies for Shingles Vaccine? Yes   Zostavax completed Yes   Shingrix Completed?: Yes  Screening Tests Health Maintenance  Topic Date Due   Medicare Annual Wellness (AWV)  Never done   FOOT EXAM  Never done   Diabetic kidney  evaluation - Urine ACR  Never done   OPHTHALMOLOGY EXAM  10/05/2022 (Originally 07/16/1961)   COVID-19 Vaccine (6 - 2023-24 season) 11/28/2022 (Originally 07/24/2022)   HEMOGLOBIN A1C  03/17/2023   Diabetic kidney evaluation - eGFR measurement  09/28/2023   MAMMOGRAM  11/11/2023   COLONOSCOPY (Pts 45-29yr Insurance coverage will need to be confirmed)  08/18/2024   DTaP/Tdap/Td (7 - Td or Tdap) 10/11/2031   Pneumonia Vaccine 72 Years old  Completed   INFLUENZA VACCINE  Completed   DEXA SCAN  Completed   Hepatitis C Screening  Completed   Zoster Vaccines- Shingrix  Completed   HPV VACCINES  Aged Out    Health Maintenance  Health Maintenance Due  Topic Date Due   Medicare Annual Wellness (AWV)  Never done   FOOT EXAM  Never done   Diabetic kidney evaluation - Urine ACR  Never done    Colorectal cancer screening: Type of screening: Colonoscopy. Completed 08/18/2014. Repeat every 10 years  Mammogram status: Completed 11/10/2021. Repeat every year  Bone Density status: Completed  08/27/2017.   Lung Cancer Screening: (Low Dose CT Chest recommended if Age 71-80years, 30 pack-year currently smoking OR have quit w/in 15years.) does not qualify.   Lung Cancer Screening Referral: no  Additional Screening:  Hepatitis C Screening: does qualify; Completed 09/29/2016  Vision Screening: Recommended annual ophthalmology exams for early detection of glaucoma and other disorders of the eye. Is the patient up to date with their annual eye exam?  Yes  Who is the provider or what is the name of the office in which the patient attends annual eye exams? Dr. MEllie LunchIf pt is not established with a provider, would they like to be referred to a provider to establish care? No .   Dental Screening: Recommended annual dental exams for proper oral hygiene  Community Resource Referral / Chronic Care Management: CRR required this visit?  No   CCM required this visit?  No      Plan:     I have personally reviewed and noted the following in the patient's chart:   Medical and social history Use of alcohol, tobacco or illicit drugs  Current medications and supplements including opioid prescriptions. Patient is not currently taking opioid prescriptions. Functional ability and status Nutritional status Physical activity Advanced directives List of other physicians Hospitalizations, surgeries, and ER visits in previous 12 months Vitals Screenings to include cognitive, depression, and falls Referrals and appointments  In addition, I have reviewed and discussed with patient certain preventive protocols, quality metrics, and best practice recommendations. A written personalized care plan for preventive services as well as general preventive health recommendations were provided to patient.     NKellie Simmering LPN   [id number]  Nurse Notes: none  Due to this being a virtual visit, the after visit summary with patients personalized plan was offered to patient via mail or my-chart.   Patient would like to access on my-chart

## 2022-10-04 ENCOUNTER — Telehealth: Payer: Self-pay | Admitting: Family Medicine

## 2022-10-04 DIAGNOSIS — F332 Major depressive disorder, recurrent severe without psychotic features: Secondary | ICD-10-CM | POA: Diagnosis not present

## 2022-10-04 LAB — COMPREHENSIVE METABOLIC PANEL
ALT: 19 IU/L (ref 0–32)
AST: 21 IU/L (ref 0–40)
Albumin/Globulin Ratio: 2.4 — ABNORMAL HIGH (ref 1.2–2.2)
Albumin: 4.7 g/dL (ref 3.9–4.9)
Alkaline Phosphatase: 62 IU/L (ref 44–121)
BUN/Creatinine Ratio: 13 (ref 12–28)
BUN: 12 mg/dL (ref 8–27)
Bilirubin Total: 0.3 mg/dL (ref 0.0–1.2)
CO2: 23 mmol/L (ref 20–29)
Calcium: 9.7 mg/dL (ref 8.7–10.3)
Chloride: 85 mmol/L — ABNORMAL LOW (ref 96–106)
Creatinine, Ser: 0.9 mg/dL (ref 0.57–1.00)
Globulin, Total: 2 g/dL (ref 1.5–4.5)
Glucose: 115 mg/dL — ABNORMAL HIGH (ref 70–99)
Potassium: 4.5 mmol/L (ref 3.5–5.2)
Sodium: 123 mmol/L — ABNORMAL LOW (ref 134–144)
Total Protein: 6.7 g/dL (ref 6.0–8.5)
eGFR: 69 mL/min/{1.73_m2} (ref >59)

## 2022-10-04 NOTE — Telephone Encounter (Signed)
I advised patient of Dr. Lanice Shirts message from lab results:  "Your sodium continues to drop and your blood sugar is also elevated.  I left your message on your home phone so we can discuss this.  You to increase your salt try to cut back on your fluids and you guys stop that Topamax. "  Patient agrees with this plan, but states she in not on Topamax. She is taking Lamictal. Patient wants to know you meant for her to stop Lamictal? One week follow up appointment has been scheduled.

## 2022-10-04 NOTE — Telephone Encounter (Signed)
Kim called Dr.Lalonde back but you both were with a patient, I let her know yall would call back when available.

## 2022-10-06 NOTE — Telephone Encounter (Signed)
Pt stopped medication

## 2022-10-08 ENCOUNTER — Other Ambulatory Visit (HOSPITAL_BASED_OUTPATIENT_CLINIC_OR_DEPARTMENT_OTHER): Payer: Self-pay

## 2022-10-09 ENCOUNTER — Other Ambulatory Visit: Payer: Self-pay

## 2022-10-10 ENCOUNTER — Other Ambulatory Visit: Payer: Self-pay | Admitting: Family Medicine

## 2022-10-10 ENCOUNTER — Other Ambulatory Visit: Payer: Self-pay | Admitting: Obstetrics & Gynecology

## 2022-10-10 ENCOUNTER — Other Ambulatory Visit (HOSPITAL_BASED_OUTPATIENT_CLINIC_OR_DEPARTMENT_OTHER): Payer: Self-pay

## 2022-10-10 DIAGNOSIS — Z1231 Encounter for screening mammogram for malignant neoplasm of breast: Secondary | ICD-10-CM

## 2022-10-10 DIAGNOSIS — F332 Major depressive disorder, recurrent severe without psychotic features: Secondary | ICD-10-CM | POA: Diagnosis not present

## 2022-10-10 NOTE — Telephone Encounter (Signed)
Is this okay to refill? 

## 2022-10-11 ENCOUNTER — Other Ambulatory Visit (HOSPITAL_BASED_OUTPATIENT_CLINIC_OR_DEPARTMENT_OTHER): Payer: Self-pay

## 2022-10-11 MED ORDER — ESZOPICLONE 2 MG PO TABS
2.0000 mg | ORAL_TABLET | Freq: Every day | ORAL | 1 refills | Status: DC
  Filled 2022-10-11: qty 10, 10d supply, fill #0
  Filled 2022-10-11: qty 20, 20d supply, fill #0
  Filled 2022-12-08: qty 30, 30d supply, fill #1

## 2022-10-12 ENCOUNTER — Ambulatory Visit (INDEPENDENT_AMBULATORY_CARE_PROVIDER_SITE_OTHER): Payer: PPO | Admitting: Family Medicine

## 2022-10-12 ENCOUNTER — Other Ambulatory Visit (HOSPITAL_BASED_OUTPATIENT_CLINIC_OR_DEPARTMENT_OTHER): Payer: Self-pay

## 2022-10-12 ENCOUNTER — Encounter: Payer: Self-pay | Admitting: Family Medicine

## 2022-10-12 VITALS — BP 138/80 | HR 72 | Temp 98.2°F | Resp 16 | Wt 168.0 lb

## 2022-10-12 DIAGNOSIS — F5104 Psychophysiologic insomnia: Secondary | ICD-10-CM | POA: Diagnosis not present

## 2022-10-12 DIAGNOSIS — F32A Depression, unspecified: Secondary | ICD-10-CM | POA: Diagnosis not present

## 2022-10-12 DIAGNOSIS — E222 Syndrome of inappropriate secretion of antidiuretic hormone: Secondary | ICD-10-CM | POA: Diagnosis not present

## 2022-10-12 NOTE — Progress Notes (Signed)
   Subjective:    Patient ID: [name], female    DOB: June 09, 1951, 72 y.o.   MRN: [id number]  HPI She is here for recheck.  She has been doing a good job with fluid restriction.  She has been off the Lamictal l for over a week now.  She did mention the blood pressure problems and the heart rate as well as headaches that she is having and the relation to the hyponatremia.  I explained that that can certainly be the reasoning behind all this.  I then discussed the [name] that she has been on for quite some time for chronic insomnia and the fact that that plays a role with her depression and treatment.   Review of Systems     Objective:   Physical Exam Alert and in no distress otherwise not examined       Assessment & Plan:  Inappropriate ADH syndrome (HCC) - Plan: Basic Metabolic Panel  Chronic insomnia  Depression, controlled I will follow-up with the studies and if she is still hyponatremic, will need to refer to endocrine for more extensive evaluation.  I then discussed the insomnia and the depression with her.  We have agreed that in the future will be best for [name] to help with that since she is in charge of the other psychotropic medications.  We might also need to do further sleep studies on her at some point in the near future.  She expressed understanding of all this.

## 2022-10-13 ENCOUNTER — Other Ambulatory Visit (HOSPITAL_BASED_OUTPATIENT_CLINIC_OR_DEPARTMENT_OTHER): Payer: Self-pay

## 2022-10-13 LAB — BASIC METABOLIC PANEL
BUN/Creatinine Ratio: 12 (ref 12–28)
BUN: 10 mg/dL (ref 8–27)
CO2: 22 mmol/L (ref 20–29)
Calcium: 9.4 mg/dL (ref 8.7–10.3)
Chloride: 93 mmol/L — ABNORMAL LOW (ref 96–106)
Creatinine, Ser: 0.83 mg/dL (ref 0.57–1.00)
Glucose: 99 mg/dL (ref 70–99)
Potassium: 4.7 mmol/L (ref 3.5–5.2)
Sodium: 131 mmol/L — ABNORMAL LOW (ref 134–144)
eGFR: 76 mL/min/{1.73_m2} (ref >59)

## 2022-10-13 MED ORDER — ESZOPICLONE 2 MG PO TABS
2.0000 mg | ORAL_TABLET | Freq: Every day | ORAL | 3 refills | Status: DC
  Filled 2022-10-13: qty 30, 30d supply, fill #0

## 2022-10-14 ENCOUNTER — Other Ambulatory Visit (HOSPITAL_BASED_OUTPATIENT_CLINIC_OR_DEPARTMENT_OTHER): Payer: Self-pay

## 2022-10-14 MED ORDER — ESZOPICLONE 2 MG PO TABS
2.0000 mg | ORAL_TABLET | Freq: Every evening | ORAL | 3 refills | Status: DC
  Filled 2022-10-27 – 2022-11-08 (×3): qty 30, 30d supply, fill #0
  Filled 2022-12-04: qty 30, 30d supply, fill #1

## 2022-10-18 ENCOUNTER — Encounter: Payer: Self-pay | Admitting: Family Medicine

## 2022-10-18 ENCOUNTER — Other Ambulatory Visit (HOSPITAL_BASED_OUTPATIENT_CLINIC_OR_DEPARTMENT_OTHER): Payer: Self-pay

## 2022-10-18 MED ORDER — METOPROLOL SUCCINATE ER 25 MG PO TB24
25.0000 mg | ORAL_TABLET | Freq: Every day | ORAL | 0 refills | Status: DC
  Filled 2022-10-18: qty 30, 30d supply, fill #0

## 2022-10-18 NOTE — Telephone Encounter (Signed)
Is it ok to refill? 

## 2022-10-19 ENCOUNTER — Other Ambulatory Visit (HOSPITAL_BASED_OUTPATIENT_CLINIC_OR_DEPARTMENT_OTHER): Payer: Self-pay

## 2022-10-27 ENCOUNTER — Encounter (HOSPITAL_BASED_OUTPATIENT_CLINIC_OR_DEPARTMENT_OTHER): Payer: Self-pay | Admitting: Pharmacist

## 2022-10-27 ENCOUNTER — Other Ambulatory Visit (HOSPITAL_BASED_OUTPATIENT_CLINIC_OR_DEPARTMENT_OTHER): Payer: Self-pay

## 2022-10-28 ENCOUNTER — Other Ambulatory Visit (HOSPITAL_BASED_OUTPATIENT_CLINIC_OR_DEPARTMENT_OTHER): Payer: Self-pay

## 2022-10-28 ENCOUNTER — Other Ambulatory Visit: Payer: Self-pay

## 2022-10-31 ENCOUNTER — Other Ambulatory Visit: Payer: Self-pay | Admitting: Family Medicine

## 2022-10-31 ENCOUNTER — Other Ambulatory Visit (HOSPITAL_BASED_OUTPATIENT_CLINIC_OR_DEPARTMENT_OTHER): Payer: Self-pay

## 2022-10-31 ENCOUNTER — Encounter (HOSPITAL_BASED_OUTPATIENT_CLINIC_OR_DEPARTMENT_OTHER): Payer: Self-pay | Admitting: Pharmacist

## 2022-10-31 NOTE — Telephone Encounter (Signed)
This was just refilled on 10/18/22

## 2022-11-01 DIAGNOSIS — F331 Major depressive disorder, recurrent, moderate: Secondary | ICD-10-CM | POA: Diagnosis not present

## 2022-11-07 ENCOUNTER — Other Ambulatory Visit (HOSPITAL_BASED_OUTPATIENT_CLINIC_OR_DEPARTMENT_OTHER): Payer: Self-pay

## 2022-11-07 DIAGNOSIS — I700 Atherosclerosis of aorta: Secondary | ICD-10-CM | POA: Diagnosis not present

## 2022-11-07 DIAGNOSIS — F330 Major depressive disorder, recurrent, mild: Secondary | ICD-10-CM | POA: Diagnosis not present

## 2022-11-07 DIAGNOSIS — E559 Vitamin D deficiency, unspecified: Secondary | ICD-10-CM | POA: Diagnosis not present

## 2022-11-07 DIAGNOSIS — E663 Overweight: Secondary | ICD-10-CM | POA: Diagnosis not present

## 2022-11-07 DIAGNOSIS — K219 Gastro-esophageal reflux disease without esophagitis: Secondary | ICD-10-CM | POA: Diagnosis not present

## 2022-11-07 DIAGNOSIS — E785 Hyperlipidemia, unspecified: Secondary | ICD-10-CM | POA: Diagnosis not present

## 2022-11-07 DIAGNOSIS — M1990 Unspecified osteoarthritis, unspecified site: Secondary | ICD-10-CM | POA: Diagnosis not present

## 2022-11-07 DIAGNOSIS — G8929 Other chronic pain: Secondary | ICD-10-CM | POA: Diagnosis not present

## 2022-11-07 DIAGNOSIS — I10 Essential (primary) hypertension: Secondary | ICD-10-CM | POA: Diagnosis not present

## 2022-11-07 DIAGNOSIS — F17200 Nicotine dependence, unspecified, uncomplicated: Secondary | ICD-10-CM | POA: Diagnosis not present

## 2022-11-07 DIAGNOSIS — K5900 Constipation, unspecified: Secondary | ICD-10-CM | POA: Diagnosis not present

## 2022-11-07 DIAGNOSIS — G4700 Insomnia, unspecified: Secondary | ICD-10-CM | POA: Diagnosis not present

## 2022-11-07 MED ORDER — AMOXICILLIN 500 MG PO CAPS
ORAL_CAPSULE | ORAL | 1 refills | Status: DC
  Filled 2022-11-07: qty 30, 10d supply, fill #0

## 2022-11-08 ENCOUNTER — Other Ambulatory Visit (HOSPITAL_BASED_OUTPATIENT_CLINIC_OR_DEPARTMENT_OTHER): Payer: Self-pay

## 2022-11-08 DIAGNOSIS — F331 Major depressive disorder, recurrent, moderate: Secondary | ICD-10-CM | POA: Diagnosis not present

## 2022-11-14 ENCOUNTER — Ambulatory Visit (HOSPITAL_BASED_OUTPATIENT_CLINIC_OR_DEPARTMENT_OTHER): Payer: Medicare Other | Admitting: Cardiovascular Disease

## 2022-11-15 DIAGNOSIS — F331 Major depressive disorder, recurrent, moderate: Secondary | ICD-10-CM | POA: Diagnosis not present

## 2022-11-21 ENCOUNTER — Ambulatory Visit
Admission: RE | Admit: 2022-11-21 | Discharge: 2022-11-21 | Disposition: A | Discharge status: Home / Self Care | Payer: PPO | Source: Ambulatory Visit | Attending: Obstetrics & Gynecology | Admitting: Obstetrics & Gynecology

## 2022-11-21 ENCOUNTER — Other Ambulatory Visit (HOSPITAL_BASED_OUTPATIENT_CLINIC_OR_DEPARTMENT_OTHER): Payer: Self-pay

## 2022-11-21 ENCOUNTER — Other Ambulatory Visit: Payer: Self-pay | Admitting: Family Medicine

## 2022-11-21 DIAGNOSIS — Z1231 Encounter for screening mammogram for malignant neoplasm of breast: Secondary | ICD-10-CM | POA: Diagnosis not present

## 2022-11-21 MED ORDER — METOPROLOL SUCCINATE ER 25 MG PO TB24
25.0000 mg | ORAL_TABLET | Freq: Every day | ORAL | 0 refills | Status: DC
  Filled 2022-11-21: qty 30, 30d supply, fill #0

## 2022-11-27 ENCOUNTER — Encounter: Payer: Self-pay | Admitting: Family Medicine

## 2022-11-27 ENCOUNTER — Ambulatory Visit (INDEPENDENT_AMBULATORY_CARE_PROVIDER_SITE_OTHER): Payer: PPO | Admitting: Family Medicine

## 2022-11-27 VITALS — BP 114/68 | HR 72 | Temp 97.8°F | Resp 18 | Wt 170.0 lb

## 2022-11-27 DIAGNOSIS — E222 Syndrome of inappropriate secretion of antidiuretic hormone: Secondary | ICD-10-CM | POA: Diagnosis not present

## 2022-11-27 NOTE — Progress Notes (Signed)
   Subjective:    Patient ID: [name], female    DOB: [birth year], 72 y.o.   MRN: [id number]  HPI She is here for recheck.  She is back to eating her regular meals.  For short period of time she did increase her salt intake.  She has definitely stopped taking the Lamictal.  She has no other concerns or complaints.   Review of Systems     Objective:   Physical Exam Alert and in no distress otherwise not examined       Assessment & Plan:  Inappropriate ADH syndrome - Plan: Comprehensive metabolic panel

## 2022-11-28 LAB — COMPREHENSIVE METABOLIC PANEL
ALT: 19 IU/L (ref 0–32)
AST: 28 IU/L (ref 0–40)
Albumin/Globulin Ratio: 2.6 — ABNORMAL HIGH (ref 1.2–2.2)
Albumin: 4.6 g/dL (ref 3.9–4.9)
Alkaline Phosphatase: 61 IU/L (ref 44–121)
BUN/Creatinine Ratio: 17 (ref 12–28)
BUN: 16 mg/dL (ref 8–27)
Bilirubin Total: 0.4 mg/dL (ref 0.0–1.2)
CO2: 23 mmol/L (ref 20–29)
Calcium: 9.5 mg/dL (ref 8.7–10.3)
Chloride: 93 mmol/L — ABNORMAL LOW (ref 96–106)
Creatinine, Ser: 0.96 mg/dL (ref 0.57–1.00)
Globulin, Total: 1.8 g/dL (ref 1.5–4.5)
Glucose: 94 mg/dL (ref 70–99)
Potassium: 4.8 mmol/L (ref 3.5–5.2)
Sodium: 131 mmol/L — ABNORMAL LOW (ref 134–144)
Total Protein: 6.4 g/dL (ref 6.0–8.5)
eGFR: 64 mL/min/{1.73_m2} (ref >59)

## 2022-11-30 ENCOUNTER — Telehealth: Payer: Self-pay

## 2022-11-30 ENCOUNTER — Encounter: Payer: Self-pay | Admitting: Adult Health

## 2022-11-30 ENCOUNTER — Ambulatory Visit: Payer: PPO | Admitting: Adult Health

## 2022-11-30 ENCOUNTER — Other Ambulatory Visit (HOSPITAL_BASED_OUTPATIENT_CLINIC_OR_DEPARTMENT_OTHER): Payer: Self-pay

## 2022-11-30 DIAGNOSIS — F331 Major depressive disorder, recurrent, moderate: Secondary | ICD-10-CM

## 2022-11-30 NOTE — Progress Notes (Signed)
[facility] MD/PA/NP Initial Note  11/30/2022 1:52 PM [name]  MRN:  [id number]  Chief Complaint:   HPI:   Patient seen today for initial psychiatric evaluation.   Referred by therapist  Describes mood today as "not the best". Pleasant. Tearful at times - mostly related to daughter. Mood symptoms - reports depression. Denies anxiety and irritability. Reports worry, rumination, and over thinking. Mood is lower. Stating "I don't get excited about doing things". 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 Typically likes to stay busy and do things, now I'm not busy enough. Feels "bored". Has been volunteering and working on stained glass projects. Stating "I feel empty". Has tried multiple medications over the years with various results. Currently taking Aplenzin 374mg  daily to help manage mood symptoms. Does not feel like current medication is working helpful. Varying interest and motivation. Taking medications as prescribed.  Energy levels stable. Active, has a regular exercise routine - going to the gym.  Enjoys some usual interests and activities. Lives alone - 2 cats. Has 2 children son 66 [place], daughter 87 in [place] and 2 grandchildren. Spending time with a friend. Appetite adequate - "diet not good". Weight 168 pounds. Sleeps well most nights. Averages 7 hours during the night and a 2 hour nap. Focus and concentration stable. Reports getting distracted easily - gets bored with what she's doing. Completing tasks. Managing aspects of household. Retired.  Denies SI or HI.  Denies AH or VH. Denies self harm. Denies substance use.   Previous medication trials:  Zoloft, Lamictal - low sodium, Celexa, Wellbutrin, Lexapro, Effexor, Vraylar  Visit Diagnosis:    ICD-10-CM   1. Major depressive disorder, recurrent episode, moderate  F33.1       Past Psychiatric History: Denies psychiatric hospitalization.   Past Medical History:  Past Medical  History:  Diagnosis Date   Anemia    Anxiety    Arthritis    Complication of anesthesia    past hx. laryngospasm following 2 past surgeries, not recent   Depression    Exertional dyspnea 08/04/2019   GERD (gastroesophageal reflux disease)    Hiatal hernia 10/17/2019   Hyperlipidemia    Insomnia    periodically uses med.   Perforated sigmoid colon 04/28/2014   during colonoscopy, "rupture colon"became septic, has colostomy and open surgical wound   Pneumonia    Pre-diabetes    Prediabetes 08/09/2017   Suicide attempt    Vitamin D deficiency     Past Surgical History:  Procedure Laterality Date   ABDOMINAL HYSTERECTOMY  1999   TAH/BSO   APPLICATION OF WOUND VAC  04/28/2014   Procedure: APPLICATION OF WOUND VAC;  Surgeon: Emelia Loron, MD;  Location: [facility] OR;  Service: General;;   BLEPHAROPLASTY  2010   BREAST SURGERY  2001   Breast reduction   COLOSTOMY N/A 04/28/2014   Procedure: COLOSTOMY;  Surgeon: Emelia Loron, MD;  Location: [facility] OR;  Service: General;  Laterality: N/A;   COLOSTOMY REVISION N/A 04/28/2014   Procedure: COLON RESECTION SIGMOID;  Surgeon: Emelia Loron, MD;  Location: [facility] OR;  Service: General;  Laterality: N/A;   COLOSTOMY TAKEDOWN N/A 09/02/2014   Procedure: LAPAROSCOPIC COLOSTOMY REVERSAL;  Surgeon: Romie Levee, MD;  Location: [facility] ORS;  Service: General;  Laterality: N/A;   COSMETIC SURGERY     DEBRIDEMENT TENNIS ELBOW     DILATION AND CURETTAGE OF UTERUS     x3, hysteroscopy   ELBOW SURGERY  2007   EYE  SURGERY     Blepharoplasty   LAPAROTOMY N/A 04/28/2014   Procedure: EXPLORATORY LAPAROTOMY,SIGMOID COLECTOMY;  Surgeon: Emelia Loron, MD;  Location[facility] OR;  Service: General;  Laterality: N/A;   ORIF WRIST FRACTURE Left 02/19/2021   Procedure: Left wrist open reduction internal fixation and repair as indicated;  Surgeon: Bradly Bienenstock, MD;  Location: [facility] OR;  Service: Orthopedics;  Laterality: Left;    REDUCTION MAMMAPLASTY Bilateral     scar and adhesion repair  06/01/15   and liposuction   TOTAL KNEE ARTHROPLASTY Right 06/27/2022   Procedure: RIGHT TOTAL KNEE ARTHROPLASTY;  Surgeon: Marcene Corning, MD;  Location: [facility] ORS;  Service: Orthopedics;  Laterality: Right;   ULNAR TUNNEL RELEASE Left 2010    Family Psychiatric History: Family history of mental illness - depression and anxiety.   Family History:  Family History  Problem Relation Age of Onset   Hyperlipidemia Father    Hypertension Father    Diabetes Father    Cancer Father        History of bladder cancer   Heart disease Father    Other Father        4 ft intestinal removed-had gangrene in gallbladder, then spread to intestines   Heart attack Father    Hypertension Mother    Diabetes Mother    Heart disease Maternal Grandmother    Stroke Maternal Grandfather    Hyperlipidemia Brother    Cancer Paternal Grandmother        uterine   Cancer Paternal Grandfather        pancreatic cancer   Esophageal cancer Sister     Social History:  Social History   Socioeconomic History   Marital status: Divorced    Spouse name: Not on file   Number of children: 2   Years of education: Not on file   Highest education level: Not on file  Occupational History   Occupation: retired  Tobacco Use   Smoking status: Former    Packs/day: .5    Types: Cigarettes    Quit date: 09/01/2003    Years since quitting: 19.2   Smokeless tobacco: Never   Tobacco comments:    occasional  Vaping Use   Vaping Use: Every day   Substances: Nicotine, Nicotine-salt  Substance and Sexual Activity   Alcohol use: Yes    Alcohol/week: 14.0 standard drinks of alcohol    Types: 14 Cans of beer per week    Comment: occas.   Drug use: Not Currently    Types: Marijuana   Sexual activity: Not Currently    Birth control/protection: Surgical    Comment: TAH/BSO  Other Topics Concern   Not on file  Social History Narrative   College Degree   Registered Nurse   Exercises       Social Determinants of Health   Financial Resource Strain: Low Risk  (10/03/2022)   Overall Financial Resource Strain (CARDIA)    Difficulty of Paying Living Expenses: Not hard at all  Food Insecurity: No Food Insecurity (10/03/2022)   Hunger Vital Sign    Worried About Running Out of Food in the Last Year: Never true    Ran Out of Food in the Last Year: Never true  Transportation Needs: No Transportation Needs (10/03/2022)   PRAPARE - Administrator, Civil Service (Medical): No    Lack of Transportation (Non-Medical): No  Physical Activity: Sufficiently Active (10/03/2022)   Exercise Vital Sign    Days of Exercise per Week:  6 days    Minutes of Exercise per Session: 60 min  Stress: No Stress Concern Present (10/03/2022)   [employer] of Occupational Health - Occupational Stress Questionnaire    Feeling of Stress : Not at all  Social Connections: Not on file    Allergies:  Allergies  Allergen Reactions   Bee Venom Swelling    Yellow jackets   Statins Other (See Comments)    Muscle pains  CRESTOR, LIPITOR    Metabolic Disorder Labs: Lab Results  Component Value Date   HGBA1C 5.5 09/16/2022   MPG 111.15 09/16/2022   MPG 108.28 06/14/2022   No results found for: "PROLACTIN" Lab Results  Component Value Date   CHOL 323 (H) 09/27/2022   TRIG 95 09/27/2022   HDL 119 09/27/2022   CHOLHDL 2.7 09/27/2022   VLDL 44 (H) 12/03/2020   LDLCALC 189 (H) 09/27/2022   LDLCALC 86 10/31/2021   Lab Results  Component Value Date   TSH 4.043 09/16/2022   TSH 4.341 12/03/2020    Therapeutic Level Labs: No results found for: "LITHIUM" No results found for: "VALPROATE" No results found for: "CBMZ"  Current Medications: Current Outpatient Medications  Medication Sig Dispense Refill   amLODipine (NORVASC) 5 MG tablet Take 1 tablet (5 mg total) by mouth daily. 90 tablet 3   esomeprazole (NEXIUM) 40 MG capsule Take 1 capsule (40 mg total) by mouth 2 (two) times  daily before a meal. 60 capsule 11   eszopiclone (LUNESTA) 2 MG TABS tablet Take 1 tablet by mouth daily at bedtime. 30 tablet 1   eszopiclone (LUNESTA) 2 MG TABS tablet Take 1 tablet (2 mg total) by mouth at bedtime. 30 tablet 3   eszopiclone (LUNESTA) 2 MG TABS tablet Take 1 tablet (2 mg total) by mouth at bedtime. 30 tablet 3   Evolocumab (REPATHA SURECLICK) 140 MG/ML SOAJ Inject 140 mg into the skin every 14 (fourteen) days. 6 mL 3   Ferrous Sulfate (SLOW FE PO) Take 1 tablet by mouth daily.     gabapentin (NEURONTIN) 600 MG tablet Take 1 tablet (600 mg total) by mouth every evening. 90 tablet 3   metoprolol succinate (TOPROL-XL) 25 MG 24 hr tablet Take 1 tablet (25 mg total) by mouth daily. 30 tablet 0   ondansetron (ZOFRAN-ODT) 4 MG disintegrating tablet Dissolve 1-2 tablets under the tongue every 6 hours as needed 30 tablet 2   prazosin (MINIPRESS) 2 MG capsule Take 1 capsule (2 mg total) by mouth at bedtime. 30 capsule 3   Vitamin D, Ergocalciferol, (DRISDOL) 1.25 MG (50000 UNIT) CAPS capsule Take 1 capsule by mouth every 7 days. (Patient taking differently: Take 50,000 Units by mouth every Thursday.) 12 capsule 3   No current facility-administered medications for this visit.    Medication Side Effects: none  Orders placed this visit:  No orders of the defined types were placed in this encounter.   Psychiatric Specialty Exam:  Review of Systems  Musculoskeletal:  Negative for gait problem.  Neurological:  Negative for tremors.  Psychiatric/Behavioral:         Please refer to HPI    Last menstrual period 08/07/1998.There is no height or weight on file to calculate BMI.  General Appearance: Casual and Neat  Eye Contact:  Good  Speech:  Clear and Coherent and Normal Rate  Volume:  Normal  Mood:  Euthymic  Affect:  Appropriate and Congruent  Thought Process:  Coherent  Orientation:  Full (Time, Place, and Person)  Thought Content: Logical   Suicidal Thoughts:  No  Homicidal  Thoughts:  No  Memory:  WNL  Judgement:  Good  Insight:  Good  Psychomotor Activity:  Normal  Concentration:  Concentration: Good and Attention Span: Good  Recall:  Good  Fund of Knowledge: Good  Language: Good  Assets:  Communication Skills Desire for Improvement Financial Resources/Insurance Housing Intimacy Leisure Time Physical Health Resilience Social Support Talents/Skills Transportation Vocational/Educational  ADL's:  Intact  Cognition: WNL  Prognosis:  Good   Screenings:  PHQ2-9    Flowsheet Row Clinical Support from 10/03/2022 in [facility] Office Visit from 05/08/2022 in [facility] for [employer] [facility] [employer] Office Visit from 04/03/2022 in [facility] for [employer] [facility] [employer] Office Visit from 03/02/2022 in [facility] for [employer] [facility] [place] Office Visit from 04/21/2019 in [facility]  PHQ-2 Total Score 3 0 0 0 0  PHQ-9 Total Score 6 -- -- -- --      Flowsheet Row ED from 09/16/2022 in [facility] Admission (Discharged) from 06/27/2022 in [facility] Pre-Admission Testing 60 from 06/14/2022 in [facility] TEST[facility]  C-SSRS RISK CATEGORY No Risk No Risk Error: Question 6 not populated       Receiving Psychotherapy: Yes   Treatment Plan/Recommendations:   Plan:  PDMP reviewed  Applenzin  daily plans to taper off.  Consider restarting Wellbutrin XL  Discussed Vitamin D and B levels  Consider Gene Sight  RTC 4 weeks  Patient advised to contact office with any questions, adverse effects, or acute worsening in signs and symptoms.    Dorothyann Gibbs, NP

## 2022-11-30 NOTE — Telephone Encounter (Signed)
Pt called to advise Dr. Kallie Locks is wanting to have labs drawn to check vit b, vit d, and iron before a depression medication is prescribed. The pt is needing to have her labs drawn here if possible. Orders are needed before scheduling.

## 2022-12-01 NOTE — Telephone Encounter (Signed)
Kim called today and I relayed your message. She says the doctor wants blood work to make sure Kim's metabolic system is ok before she prescribes anything.

## 2022-12-04 ENCOUNTER — Other Ambulatory Visit: Payer: Self-pay | Admitting: Family Medicine

## 2022-12-04 ENCOUNTER — Other Ambulatory Visit (HOSPITAL_BASED_OUTPATIENT_CLINIC_OR_DEPARTMENT_OTHER): Payer: Self-pay

## 2022-12-04 ENCOUNTER — Other Ambulatory Visit: Payer: Self-pay

## 2022-12-04 ENCOUNTER — Telehealth: Payer: Self-pay | Admitting: Adult Health

## 2022-12-04 DIAGNOSIS — E559 Vitamin D deficiency, unspecified: Secondary | ICD-10-CM

## 2022-12-04 MED ORDER — METOPROLOL SUCCINATE ER 25 MG PO TB24
25.0000 mg | ORAL_TABLET | Freq: Every day | ORAL | 5 refills | Status: DC
  Filled 2022-12-04 – 2022-12-14 (×5): qty 30, 30d supply, fill #0
  Filled 2023-01-23: qty 30, 30d supply, fill #1
  Filled 2023-02-18: qty 30, 30d supply, fill #2
  Filled 2023-03-19: qty 30, 30d supply, fill #3
  Filled 2023-04-18: qty 30, 30d supply, fill #4
  Filled 2023-05-19: qty 30, 30d supply, fill #5

## 2022-12-04 NOTE — Telephone Encounter (Signed)
Ok to pend. Vitamin D not E.

## 2022-12-04 NOTE — Telephone Encounter (Signed)
[name] call at 9:40 reporting that when she was here last week you told her she needed to have labs done for Vit E, Vit B and iron.  She said her primary care will do the labs but they need to lab order.  PCP is Dr. Susann Givens.  Fax # to send lab order is [phone]

## 2022-12-04 NOTE — Telephone Encounter (Signed)
Pt called this morning about the labs, I let her know what Dr.Lalonde said and provided her our fax number so the other dr can fax over what specific labs they want done.

## 2022-12-04 NOTE — Telephone Encounter (Signed)
Patient is asking for lab order to get labs at her PCP - [facility] provider.

## 2022-12-05 ENCOUNTER — Other Ambulatory Visit (HOSPITAL_BASED_OUTPATIENT_CLINIC_OR_DEPARTMENT_OTHER): Payer: Self-pay

## 2022-12-05 NOTE — Telephone Encounter (Signed)
Lab order entered, faxed, patient notified.

## 2022-12-07 ENCOUNTER — Other Ambulatory Visit (HOSPITAL_BASED_OUTPATIENT_CLINIC_OR_DEPARTMENT_OTHER): Payer: Self-pay

## 2022-12-08 ENCOUNTER — Other Ambulatory Visit: Payer: PPO

## 2022-12-08 ENCOUNTER — Other Ambulatory Visit (HOSPITAL_BASED_OUTPATIENT_CLINIC_OR_DEPARTMENT_OTHER): Payer: Self-pay

## 2022-12-08 ENCOUNTER — Encounter (HOSPITAL_BASED_OUTPATIENT_CLINIC_OR_DEPARTMENT_OTHER): Payer: Self-pay

## 2022-12-08 DIAGNOSIS — E559 Vitamin D deficiency, unspecified: Secondary | ICD-10-CM | POA: Diagnosis not present

## 2022-12-08 DIAGNOSIS — I700 Atherosclerosis of aorta: Secondary | ICD-10-CM

## 2022-12-08 DIAGNOSIS — M791 Myalgia, unspecified site: Secondary | ICD-10-CM

## 2022-12-08 DIAGNOSIS — E785 Hyperlipidemia, unspecified: Secondary | ICD-10-CM

## 2022-12-08 NOTE — Addendum Note (Signed)
Addended by: Herminio Commons A on: 12/08/2022 10:06 AM   Modules accepted: Orders

## 2022-12-09 LAB — VITAMIN D 25 HYDROXY (VIT D DEFICIENCY, FRACTURES): Vit D, 25-Hydroxy: 49.2 ng/mL (ref 30.0–100.0)

## 2022-12-11 ENCOUNTER — Ambulatory Visit (INDEPENDENT_AMBULATORY_CARE_PROVIDER_SITE_OTHER): Payer: PPO | Admitting: Nurse Practitioner

## 2022-12-11 ENCOUNTER — Encounter: Payer: Self-pay | Admitting: Nurse Practitioner

## 2022-12-11 VITALS — BP 128/72 | HR 78 | Wt 170.4 lb

## 2022-12-11 DIAGNOSIS — R519 Headache, unspecified: Secondary | ICD-10-CM | POA: Diagnosis not present

## 2022-12-11 DIAGNOSIS — E871 Hypo-osmolality and hyponatremia: Secondary | ICD-10-CM | POA: Diagnosis not present

## 2022-12-11 DIAGNOSIS — R635 Abnormal weight gain: Secondary | ICD-10-CM | POA: Diagnosis not present

## 2022-12-11 DIAGNOSIS — R630 Anorexia: Secondary | ICD-10-CM | POA: Diagnosis not present

## 2022-12-11 DIAGNOSIS — R000 Tachycardia, unspecified: Secondary | ICD-10-CM | POA: Diagnosis not present

## 2022-12-11 DIAGNOSIS — R61 Generalized hyperhidrosis: Secondary | ICD-10-CM | POA: Diagnosis not present

## 2022-12-11 DIAGNOSIS — R5383 Other fatigue: Secondary | ICD-10-CM

## 2022-12-11 DIAGNOSIS — R140 Abdominal distension (gaseous): Secondary | ICD-10-CM

## 2022-12-11 DIAGNOSIS — R051 Acute cough: Secondary | ICD-10-CM

## 2022-12-11 DIAGNOSIS — R29898 Other symptoms and signs involving the musculoskeletal system: Secondary | ICD-10-CM

## 2022-12-11 DIAGNOSIS — E049 Nontoxic goiter, unspecified: Secondary | ICD-10-CM | POA: Diagnosis not present

## 2022-12-11 NOTE — Progress Notes (Signed)
Tollie Eth, DNP, AGNP-c [facility] [address] [place], [place] [address] [phone]  Subjective:   [name] is a 72 y.o. female presents to day for evaluation of: Multiple concerns. Deep cough 4-5 days Abdominal bloating and pain Increased sweating Weight gain Jo Batten presents with concerns about her recent lab results and reports experiencing "multiple symptoms".  She describes feeling "weird" and has not had a productive cough for the last 4 to 5 days, accompanied by hoarseness and a sore throat. 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  The patient has a slightly enlarged thyroid and the presence of congestion which may be contributing to her cough.  She also reports abdominal swelling, bloating, and a painful, burning sensation with radiation to her back.  The patient denies having symptoms consistent with pyelonephritis but does have a history of the condition. The patient has a history of GERD, hiatal hernia, and bowel issues due to rupture of colon during colonoscopy. She also reports lack of appetite and feeling worse after eating.  She is experienced significant weight gain in the past few months and reports lack of energy, which is affected her ability to exercise.    The patient has a history of low sodium levels which have improved slightly but remain below normal.  She is unsure the cause of her low sodium levels.   She experiences random sweats, tachycardia, and weakness in her arms and legs.  She is currently on cholesterol injections and has a history of anemia for which she is taking iron supplements.    She tells me since surgery in November the symptoms have all seemed to worsen.  She has also had increased blood pressure levels since that time.   PMH, Medications, and Allergies reviewed and updated in chart as appropriate.   ROS negative except  for what is listed in HPI. Objective:  BP 128/72   Pulse 78   Wt 170 lb 6.4 oz (77.3 kg)   LMP 08/07/1998   BMI 28.36 kg/m  Physical Exam Vitals and nursing note reviewed.  Constitutional:      Appearance: Normal appearance.  HENT:     Head: Normocephalic.     Nose: Nose normal.     Mouth/Throat:     Mouth: Mucous membranes are moist.     Pharynx: Oropharynx is clear.  Eyes:     Conjunctiva/sclera: Conjunctivae normal.     Pupils: Pupils are equal, round, and reactive to light.  Neck:     Vascular: No carotid bruit.     Comments: Thyroid enlargement is noted on exam. Cardiovascular:     Rate and Rhythm: Normal rate and regular rhythm.     Pulses: Normal pulses.     Heart sounds: Normal heart sounds.  Pulmonary:     Effort: Pulmonary effort is normal.     Breath sounds: Wheezing and rhonchi present.  Chest:     Chest wall: No tenderness.  Abdominal:     General: Bowel sounds are normal. There is no distension.     Palpations: Abdomen is soft. There is no mass.     Tenderness: There is abdominal tenderness. There is no right CVA tenderness, left CVA tenderness, guarding or rebound.  Musculoskeletal:        General: Normal range of motion.     Cervical back: Normal range of motion. No rigidity or tenderness.  Right lower leg: No edema.     Left lower leg: No edema.  Lymphadenopathy:     Cervical: No cervical adenopathy.  Skin:    General: Skin is warm and dry.     Capillary Refill: Capillary refill takes less than 2 seconds.  Neurological:     General: No focal deficit present.     Mental Status: She is alert and oriented to person, place, and time.     Motor: No weakness.     Gait: Gait normal.  Psychiatric:        Mood and Affect: Mood normal.           Assessment & Plan:   Problem List Items Addressed This Visit     Weakness of both arms - Primary    Patient reports persistent fatigue, weakness, and decreased energy levels over the past several  weeks.  Etiology is unknown at this time.  She does report the weakness is predominant following exercise. Plan: Will evaluate labs today for possible iron deficiency anemia or B12 deficiency.  Will also monitor sodium levels to ensure that these are appropriate at this time.      Relevant Orders   Hemoglobin A1c (Completed)   Vitamin B12 (Completed)   CBC with Differential/Platelet (Completed)   Comprehensive metabolic panel (Completed)   Iron, TIBC and Ferritin Panel (Completed)   TSH (Completed)   Weight gain, abnormal    Weight gain of unknown etiology approximately 5 pounds over the past month, along with decreased appetite. Plan: - Encouraged patient to maintain a balanced diet and regular exercise routine as tolerated. - Will obtain labs today for evaluation of possible underlying cause. - Consider referral to nutritionist or dietitian for further guidance.      Relevant Orders   Hemoglobin A1c (Completed)   Vitamin B12 (Completed)   CBC with Differential/Platelet (Completed)   Comprehensive metabolic panel (Completed)   Iron, TIBC and Ferritin Panel (Completed)   TSH (Completed)   Decreased energy    Patient reports persistent fatigue, weakness, and decreased energy levels over the past several weeks.  Etiology is unclear at this time.  Do not feel that current symptoms are related to her upper respiratory symptoms as these have just recently started. Plan: - Evaluate for possible anemia.  Will order CBC and iron studies today - Monitor response to current iron supplementation - Monitor vitamin B12 levels and consider supplementation if deficient - Encourage gradual resignation of physical activity as tolerated      Relevant Orders   Hemoglobin A1c (Completed)   Vitamin B12 (Completed)   CBC with Differential/Platelet (Completed)   Comprehensive metabolic panel (Completed)   Iron, TIBC and Ferritin Panel (Completed)   TSH (Completed)   Abdominal bloating    Patient  reports ongoing issues with abdominal pain, bloating, and discomfort, particularly after meals.  Symptoms are suggestive of possible GERD exacerbation or hiatal hernia complications.  Epigastric tenderness is present on examination. Plan: Continue current regimen of Nexium 40 mg twice daily. Consider referral for gastroenterology for further evaluation and management of possible hiatal hernia complications.      Relevant Orders   Hemoglobin A1c (Completed)   Vitamin B12 (Completed)   CBC with Differential/Platelet (Completed)   Comprehensive metabolic panel (Completed)   Iron, TIBC and Ferritin Panel (Completed)   TSH (Completed)   Unexplained night sweats    Unexplained night sweats with unclear etiology at this time.  Consider possible thyroid abnormality versus infectious process.  The  patient does have upper respiratory symptoms at this time however these are of new onset which is not consistent with pneumonia process.   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  We discussed the importance of maintaining proper fluid balance and dietary sodium intake.  In the setting of SIADH low sodium levels are a chronic concern that require ongoing management and replacement. Plan: - Continue to monitor sodium levels closely - Patient educated on the importance of maintaining a balanced diet and fluid intake as well as increased sodium intake for management.      Relevant Orders   Hemoglobin A1c (Completed)   Vitamin B12 (Completed)   CBC with Differential/Platelet (Completed)   Comprehensive metabolic panel (Completed)   Iron,  TIBC and Ferritin Panel (Completed)   TSH (Completed)   Tachycardia    Recent episode of tachycardia noted during urgent care visit.  The patient continues to experience episodes of tachycardia despite current medication regimen.   Plan: - Continue current medication management for tachycardia. - Consider cardiology referral for further evaluation and management.  Prolonged monitoring may be beneficial for any underlying symptoms.       Relevant Orders   Hemoglobin A1c (Completed)   Vitamin B12 (Completed)   CBC with Differential/Platelet (Completed)   Comprehensive metabolic panel (Completed)   Iron, TIBC and Ferritin Panel (Completed)   TSH (Completed)   Acute cough    Patient presents with productive cough, nasal congestion, and sinus pressure over the past few days, likely due to an upper respiratory infection most likely of viral etiology given the length of time symptoms have been present.  Also considered acute allergy concerns. Plan: - Recommend symptomatic treatment with over-the-counter medications as needed - Reevaluate if symptoms worsen or persist beyond 7 to 10 days from initiation for antibiotic treatment.      Relevant Orders   CBC with Differential/Platelet (Completed)   Decreased appetite    Decreased appetite in the setting of abdominal pain and bloating very likely related to possible worsening of hiatal hernia.  Strongly recommend GI evaluation given the change in symptoms.      Nonintractable headache    Patient has been experiencing frequent headaches over the past 3 few weeks with varying severity and duration. Plan: - Continued with scheduled neurology appointment for further evaluation and management. - Encourage patient to track headache frequency, severity, and potential triggers and write these down for discussion with neurologist.      Enlarged thyroid    Physical exam reveals palpable thyroid enlargement, possibly indicated of thyroid dysfunction.   In the setting of multiple symptoms this could be a potential clue. Plan: - Order thyroid function test to assess for possible hypothyroidism or other thyroid dysfunction. - Consider referral to endocrinology if thyroid function tests are abnormal or if enlargement progresses.        Time: 50 minutes, >50% spent counseling, care coordination, chart review, and documentation.   Tollie Eth, DNP, AGNP-c 12/24/2022  8:56 PM    History, Medications, Surgery, SDOH, and Family History reviewed and updated as appropriate.

## 2022-12-11 NOTE — Patient Instructions (Addendum)
I want to check some labs and see if I can see anything that may be triggering the symptoms you are experiencing. We will check the B12, Iron, thyroid, and complete blood counts.   I will let you know what the levels are looking like and if we can find any concerns.   I will look back through your history and see if there is anything that I can correlate with your symptoms.

## 2022-12-12 LAB — IRON,TIBC AND FERRITIN PANEL
Ferritin: 104 ng/mL (ref 15–150)
Iron Saturation: 41 % (ref 15–55)
Iron: 129 ug/dL (ref 27–139)
Total Iron Binding Capacity: 314 ug/dL (ref 250–450)
UIBC: 185 ug/dL (ref 118–369)

## 2022-12-12 LAB — CBC WITH DIFFERENTIAL/PLATELET
Basophils Absolute: 0 10*3/uL (ref 0.0–0.2)
Basos: 0 %
EOS (ABSOLUTE): 0.2 10*3/uL (ref 0.0–0.4)
Eos: 4 %
Hematocrit: 36.4 % (ref 34.0–46.6)
Hemoglobin: 12.3 g/dL (ref 11.1–15.9)
Immature Grans (Abs): 0 10*3/uL (ref 0.0–0.1)
Immature Granulocytes: 0 %
Lymphocytes Absolute: 1.5 10*3/uL (ref 0.7–3.1)
Lymphs: 27 %
MCH: 32.5 pg (ref 26.6–33.0)
MCHC: 33.8 g/dL (ref 31.5–35.7)
MCV: 96 fL (ref 79–97)
Monocytes Absolute: 0.4 10*3/uL (ref 0.1–0.9)
Monocytes: 7 %
Neutrophils Absolute: 3.5 10*3/uL (ref 1.4–7.0)
Neutrophils: 62 %
Platelets: 315 10*3/uL (ref 150–450)
RBC: 3.79 x10E6/uL (ref 3.77–5.28)
RDW: 12.7 % (ref 11.7–15.4)
WBC: 5.6 10*3/uL (ref 3.4–10.8)

## 2022-12-12 LAB — VITAMIN B12: Vitamin B-12: 415 pg/mL (ref 232–1245)

## 2022-12-12 LAB — COMPREHENSIVE METABOLIC PANEL
ALT: 19 IU/L (ref 0–32)
AST: 25 IU/L (ref 0–40)
Albumin/Globulin Ratio: 2 (ref 1.2–2.2)
Albumin: 4.5 g/dL (ref 3.9–4.9)
Alkaline Phosphatase: 64 IU/L (ref 44–121)
BUN/Creatinine Ratio: 9 — ABNORMAL LOW (ref 12–28)
BUN: 8 mg/dL (ref 8–27)
Bilirubin Total: <0.2 mg/dL (ref 0.0–1.2)
CO2: 23 mmol/L (ref 20–29)
Calcium: 9.5 mg/dL (ref 8.7–10.3)
Chloride: 95 mmol/L — ABNORMAL LOW (ref 96–106)
Creatinine, Ser: 0.85 mg/dL (ref 0.57–1.00)
Globulin, Total: 2.2 g/dL (ref 1.5–4.5)
Glucose: 100 mg/dL — ABNORMAL HIGH (ref 70–99)
Potassium: 5.1 mmol/L (ref 3.5–5.2)
Sodium: 134 mmol/L (ref 134–144)
Total Protein: 6.7 g/dL (ref 6.0–8.5)
eGFR: 74 mL/min/{1.73_m2} (ref >59)

## 2022-12-12 LAB — HEMOGLOBIN A1C
Est. average glucose Bld gHb Est-mCnc: 117 mg/dL
Hgb A1c MFr Bld: 5.7 % — ABNORMAL HIGH (ref 4.8–5.6)

## 2022-12-12 LAB — TSH: TSH: 3.43 u[IU]/mL (ref 0.450–4.500)

## 2022-12-12 LAB — VITAMIN D 25 HYDROXY (VIT D DEFICIENCY, FRACTURES): Vit D, 25-Hydroxy: 46.6 ng/mL (ref 30.0–100.0)

## 2022-12-13 DIAGNOSIS — F331 Major depressive disorder, recurrent, moderate: Secondary | ICD-10-CM | POA: Diagnosis not present

## 2022-12-14 ENCOUNTER — Other Ambulatory Visit (HOSPITAL_BASED_OUTPATIENT_CLINIC_OR_DEPARTMENT_OTHER): Payer: Self-pay

## 2022-12-17 ENCOUNTER — Other Ambulatory Visit (HOSPITAL_BASED_OUTPATIENT_CLINIC_OR_DEPARTMENT_OTHER): Payer: Self-pay

## 2022-12-18 ENCOUNTER — Other Ambulatory Visit (HOSPITAL_BASED_OUTPATIENT_CLINIC_OR_DEPARTMENT_OTHER): Payer: Self-pay

## 2022-12-20 DIAGNOSIS — F331 Major depressive disorder, recurrent, moderate: Secondary | ICD-10-CM | POA: Diagnosis not present

## 2022-12-24 DIAGNOSIS — R051 Acute cough: Secondary | ICD-10-CM | POA: Insufficient documentation

## 2022-12-24 DIAGNOSIS — R630 Anorexia: Secondary | ICD-10-CM | POA: Insufficient documentation

## 2022-12-24 DIAGNOSIS — R519 Headache, unspecified: Secondary | ICD-10-CM | POA: Insufficient documentation

## 2022-12-24 DIAGNOSIS — E049 Nontoxic goiter, unspecified: Secondary | ICD-10-CM | POA: Insufficient documentation

## 2022-12-24 DIAGNOSIS — R635 Abnormal weight gain: Secondary | ICD-10-CM | POA: Insufficient documentation

## 2022-12-24 DIAGNOSIS — R000 Tachycardia, unspecified: Secondary | ICD-10-CM | POA: Insufficient documentation

## 2022-12-24 DIAGNOSIS — R5383 Other fatigue: Secondary | ICD-10-CM | POA: Insufficient documentation

## 2022-12-24 DIAGNOSIS — R29898 Other symptoms and signs involving the musculoskeletal system: Secondary | ICD-10-CM | POA: Insufficient documentation

## 2022-12-24 DIAGNOSIS — E871 Hypo-osmolality and hyponatremia: Secondary | ICD-10-CM | POA: Insufficient documentation

## 2022-12-24 DIAGNOSIS — R140 Abdominal distension (gaseous): Secondary | ICD-10-CM | POA: Insufficient documentation

## 2022-12-24 DIAGNOSIS — R61 Generalized hyperhidrosis: Secondary | ICD-10-CM | POA: Insufficient documentation

## 2022-12-24 NOTE — Assessment & Plan Note (Signed)
Patient reports persistent fatigue, weakness, and decreased energy levels over the past several weeks.  Etiology is unclear at this time.  Do not feel that current symptoms are related to her upper respiratory symptoms as these have just recently started. Plan: - Evaluate for possible anemia.  Will order CBC and iron studies today - Monitor response to current iron supplementation - Monitor vitamin B12 levels and consider supplementation if deficient - Encourage gradual resignation of physical activity as tolerated

## 2022-12-24 NOTE — Assessment & Plan Note (Signed)
Recent episode of tachycardia noted during urgent care visit.  The patient continues to experience episodes of tachycardia despite current medication regimen.   Plan: - Continue current medication management for tachycardia. - Consider cardiology referral for further evaluation and management.  Prolonged monitoring may be beneficial for any underlying symptoms.

## 2022-12-24 NOTE — Assessment & Plan Note (Signed)
Chronic hyponatremia noted with review of previous labs showing diagnosis of inappropriate antidiuretic hormone.  We discussed the importance of maintaining proper fluid balance and dietary sodium intake.  In the setting of SIADH low sodium levels are a chronic concern that require ongoing management and replacement. Plan: - Continue to monitor sodium levels closely - Patient educated on the importance of maintaining a balanced diet and fluid intake as well as increased sodium intake for management.

## 2022-12-24 NOTE — Assessment & Plan Note (Signed)
Patient presents with productive cough, nasal congestion, and sinus pressure over the past few days, likely due to an upper respiratory infection most likely of viral etiology given the length of time symptoms have been present.  Also considered acute allergy concerns. Plan: - Recommend symptomatic treatment with over-the-counter medications as needed - Reevaluate if symptoms worsen or persist beyond 7 to 10 days from initiation for antibiotic treatment.

## 2022-12-24 NOTE — Assessment & Plan Note (Signed)
Patient reports persistent fatigue, weakness, and decreased energy levels over the past several weeks.  Etiology is unknown at this time.  She does report the weakness is predominant following exercise. Plan: Will evaluate labs today for possible iron deficiency anemia or B12 deficiency.  Will also monitor sodium levels to ensure that these are appropriate at this time.

## 2022-12-24 NOTE — Assessment & Plan Note (Signed)
Unexplained night sweats with unclear etiology at this time.  Consider possible thyroid abnormality versus infectious process.  The patient does have upper respiratory symptoms at this time however these are of new onset which is not consistent with pneumonia process.   Plan: - Monitor labs today for further evaluation

## 2022-12-24 NOTE — Assessment & Plan Note (Signed)
Decreased appetite in the setting of abdominal pain and bloating very likely related to possible worsening of hiatal hernia.  Strongly recommend GI evaluation given the change in symptoms.

## 2022-12-24 NOTE — Assessment & Plan Note (Signed)
Weight gain of unknown etiology approximately 5 pounds over the past month, along with decreased appetite. Plan: - Encouraged patient to maintain a balanced diet and regular exercise routine as tolerated. - Will obtain labs today for evaluation of possible underlying cause. - Consider referral to nutritionist or dietitian for further guidance.

## 2022-12-24 NOTE — Assessment & Plan Note (Signed)
Patient has been experiencing frequent headaches over the past 3 few weeks with varying severity and duration. Plan: - Continued with scheduled neurology appointment for further evaluation and management. - Encourage patient to track headache frequency, severity, and potential triggers and write these down for discussion with neurologist.

## 2022-12-24 NOTE — Assessment & Plan Note (Signed)
Patient reports ongoing issues with abdominal pain, bloating, and discomfort, particularly after meals.  Symptoms are suggestive of possible GERD exacerbation or hiatal hernia complications.  Epigastric tenderness is present on examination. Plan: Continue current regimen of Nexium 40 mg twice daily. Consider referral for gastroenterology for further evaluation and management of possible hiatal hernia complications.

## 2022-12-24 NOTE — Assessment & Plan Note (Signed)
Physical exam reveals palpable thyroid enlargement, possibly indicated of thyroid dysfunction.  In the setting of multiple symptoms this could be a potential clue. Plan: - Order thyroid function test to assess for possible hypothyroidism or other thyroid dysfunction. - Consider referral to endocrinology if thyroid function tests are abnormal or if enlargement progresses.

## 2022-12-27 DIAGNOSIS — M2042 Other hammer toe(s) (acquired), left foot: Secondary | ICD-10-CM | POA: Diagnosis not present

## 2022-12-28 ENCOUNTER — Encounter: Payer: Self-pay | Admitting: Adult Health

## 2022-12-28 ENCOUNTER — Ambulatory Visit (INDEPENDENT_AMBULATORY_CARE_PROVIDER_SITE_OTHER): Payer: PPO | Admitting: Adult Health

## 2022-12-28 ENCOUNTER — Other Ambulatory Visit (HOSPITAL_BASED_OUTPATIENT_CLINIC_OR_DEPARTMENT_OTHER): Payer: Self-pay

## 2022-12-28 DIAGNOSIS — G4700 Insomnia, unspecified: Secondary | ICD-10-CM | POA: Diagnosis not present

## 2022-12-28 DIAGNOSIS — F331 Major depressive disorder, recurrent, moderate: Secondary | ICD-10-CM

## 2022-12-28 MED ORDER — BUPROPION HCL ER (XL) 300 MG PO TB24
300.0000 mg | ORAL_TABLET | Freq: Every day | ORAL | 2 refills | Status: DC
  Filled 2022-12-28: qty 30, 30d supply, fill #0
  Filled 2023-01-23: qty 30, 30d supply, fill #1

## 2022-12-28 MED ORDER — GABAPENTIN 600 MG PO TABS
600.0000 mg | ORAL_TABLET | Freq: Every evening | ORAL | 2 refills | Status: DC
  Filled 2022-12-28: qty 30, 30d supply, fill #0
  Filled 2023-03-09: qty 30, 30d supply, fill #1
  Filled 2023-04-06: qty 30, 30d supply, fill #2

## 2022-12-28 MED ORDER — TOPIRAMATE 25 MG PO TABS
25.0000 mg | ORAL_TABLET | Freq: Two times a day (BID) | ORAL | 2 refills | Status: DC
  Filled 2022-12-28: qty 60, 30d supply, fill #0

## 2022-12-28 MED ORDER — PRAZOSIN HCL 2 MG PO CAPS
2.0000 mg | ORAL_CAPSULE | Freq: Every day | ORAL | 2 refills | Status: DC
  Filled 2022-12-28: qty 30, 30d supply, fill #0
  Filled 2023-02-09: qty 30, 30d supply, fill #1

## 2022-12-28 MED ORDER — ESZOPICLONE 2 MG PO TABS
2.0000 mg | ORAL_TABLET | Freq: Every evening | ORAL | 2 refills | Status: DC
  Filled 2022-12-28 – 2023-01-08 (×2): qty 30, 30d supply, fill #0
  Filled 2023-02-09: qty 30, 30d supply, fill #1

## 2022-12-28 NOTE — Progress Notes (Signed)
[name] [id number] 12-07-1950 72 y.o.  Subjective:   Patient ID:  [name] is a 72 y.o. (DOB 1951/01/25) female.  Chief Complaint: No chief complaint on file.   HPI [name] presents to the office today for follow-up of MDD and insomnia.   Describes mood today as "about the same". Pleasant. Tearful at times. Mood symptoms - reports depression. Denies anxiety and irritability. Reports worry, rumination, and over thinking. Mood is lower. Stating "I feel depressed". Continues to take Aplenzin 374mg  daily and would like to consider other options. Varying interest and motivation. Taking medications as prescribed. Working with therapist regularly [place].  Energy levels stable. Active, has a regular exercise routine - going to the gym.  Enjoys some usual interests and activities. Lives alone - 2 cats. Has 2 children son 64 [name], daughter 41 in [place] and 2 grandchildren. Spending time with a friend. Appetite adequate. Weight loss - 165 pounds. Sleeps well most nights. Averages 7 hours during the night. Reports some daytime napping.  Focus and concentration stable - less distracted. Completing tasks. Managing aspects of household. Previously worked as a Medical laboratory scientific officer. Retired x 2 years ago. Denies SI or HI.  Denies AH or VH. Denies self harm. Denies substance use.   Previous medication trials:  Zoloft, Lamictal - low sodium, Celexa, Wellbutrin, Lexapro, Effexor, Vraylar  PHQ2-9    Flowsheet Row Clinical Support from 10/03/2022 in [facility] Office Visit from 05/08/2022 in [facility] for [facility] at [facility] Office Visit from 04/03/2022 in [facility] for [facility] at [facility] Office Visit from 03/02/2022 in [facility] for [facility] [place] Office Visit from 04/21/2019 in [facility]  PHQ-2 Total Score 3 0 0 0 0  PHQ-9 Total Score 6 -- -- -- --       Flowsheet Row ED from 09/16/2022 in [facility] at [facility] Admission (Discharged) from 06/27/2022 in [facility] Pre-Admission Testing 60 from 06/14/2022 in [facility]  C-SSRS RISK CATEGORY No Risk No Risk Error: Question 6 not populated        Review of Systems:  Review of Systems  Musculoskeletal:  Negative for gait problem.  Neurological:  Negative for tremors.  Psychiatric/Behavioral:         Please refer to HPI    Medications: I have reviewed the patient's current medications.  Current Outpatient Medications  Medication Sig Dispense Refill   amLODipine (NORVASC) 5 MG tablet Take 1 tablet (5 mg total) by mouth daily. 90 tablet 3   esomeprazole (NEXIUM) 40 MG capsule Take 1 capsule (40 mg total) by mouth 2 (two) times daily before a meal. 60 capsule 11   eszopiclone (LUNESTA) 2 MG TABS tablet Take 1 tablet by mouth daily at bedtime. 30 tablet 1   eszopiclone (LUNESTA) 2 MG TABS tablet Take 1 tablet (2 mg total) by mouth at bedtime. 30 tablet 3   eszopiclone (LUNESTA) 2 MG TABS tablet Take 1 tablet (2 mg total) by mouth at bedtime. 30 tablet 3   Evolocumab (REPATHA SURECLICK) 140 MG/ML SOAJ Inject 140 mg into the skin every 14 (fourteen) days. 6 mL 3   Ferrous Sulfate (SLOW FE PO) Take 1 tablet by mouth daily.     gabapentin (NEURONTIN) 600 MG tablet Take 1 tablet (600 mg total) by mouth every evening. 90 tablet 3   metoprolol succinate (TOPROL-XL) 25 MG 24 hr tablet  Take 1 tablet (25 mg total) by mouth daily. 30 tablet 5   ondansetron (ZOFRAN-ODT) 4 MG disintegrating tablet Dissolve 1-2 tablets under the tongue every 6 hours as needed 30 tablet 2   prazosin (MINIPRESS) 2 MG capsule Take 1 capsule (2 mg total) by mouth at bedtime. 30 capsule 3   Vitamin D, Ergocalciferol, (DRISDOL) 1.25 MG (50000 UNIT) CAPS capsule Take 1 capsule by mouth every 7 days. (Patient taking differently: Take 50,000 Units by  mouth every Thursday.) 12 capsule 3   No current facility-administered medications for this visit.    Medication Side Effects: None  Allergies:  Allergies  Allergen Reactions   Bee Venom Swelling    Yellow jackets   Statins Other (See Comments)    Muscle pains  CRESTOR, LIPITOR    Past Medical History:  Diagnosis Date   Anemia    Anxiety    Arthritis    Complication of anesthesia    past hx. laryngospasm following 2 past surgeries, not recent   Depression    Exertional dyspnea 08/04/2019   GERD (gastroesophageal reflux disease)    Hiatal hernia 10/17/2019   Hyperlipidemia    Insomnia    periodically uses med.   Perforated sigmoid colon (HCC) 04/28/2014   during colonoscopy, "rupture colon"became septic, has colostomy and open surgical wound   Pneumonia    Pre-diabetes    Prediabetes 08/09/2017   Suicide attempt (HCC)    Vitamin D deficiency     Past Medical History, Surgical history, Social history, and Family history were reviewed and updated as appropriate.   Please see review of systems for further details on the patient's review from today.   Objective:   Physical Exam:  LMP 08/07/1998   Physical Exam Constitutional:      General: She is not in acute distress. Musculoskeletal:        General: No deformity.  Neurological:     Mental Status: She is alert and oriented to person, place, and time.     Coordination: Coordination normal.  Psychiatric:        Attention and Perception: Attention and perception normal. She does not perceive auditory or visual hallucinations.        Mood and Affect: Mood normal. Mood is not anxious or depressed. Affect is not labile, blunt, angry or inappropriate.        Speech: Speech normal.        Behavior: Behavior normal.        Thought Content: Thought content normal. Thought content is not paranoid or delusional. Thought content does not include homicidal or suicidal ideation. Thought content does not include homicidal  or suicidal plan.        Cognition and Memory: Cognition and memory normal.        Judgment: Judgment normal.     Comments: Insight intact     Lab Review:     Component Value Date/Time   NA 134 12/11/2022 1030   K 5.1 12/11/2022 1030   CL 95 (L) 12/11/2022 1030   CO2 23 12/11/2022 1030   GLUCOSE 100 (H) 12/11/2022 1030   GLUCOSE 133 (H) 09/16/2022 1415   BUN 8 12/11/2022 1030   CREATININE 0.85 12/11/2022 1030   CREATININE 0.99 08/08/2017 1629   CALCIUM 9.5 12/11/2022 1030   PROT 6.7 12/11/2022 1030   ALBUMIN 4.5 12/11/2022 1030   AST 25 12/11/2022 1030   ALT 19 12/11/2022 1030   ALKPHOS 64 12/11/2022 1030   BILITOT <0.2 12/11/2022 1030  GFRNONAA 43 (L) 09/16/2022 1415   GFRNONAA 76 04/14/2014 1427   GFRAA 72 08/27/2019 1112   GFRAA 88 04/14/2014 1427       Component Value Date/Time   WBC 5.6 12/11/2022 1030   WBC 8.0 09/16/2022 1415   RBC 3.79 12/11/2022 1030   RBC 4.18 09/16/2022 1415   HGB 12.3 12/11/2022 1030   HGB 11.9 (L) 06/14/2015 1528   HCT 36.4 12/11/2022 1030   PLT 315 12/11/2022 1030   MCV 96 12/11/2022 1030   MCH 32.5 12/11/2022 1030   MCH 31.8 09/16/2022 1415   MCHC 33.8 12/11/2022 1030   MCHC 35.2 09/16/2022 1415   RDW 12.7 12/11/2022 1030   LYMPHSABS 1.5 12/11/2022 1030   MONOABS 0.4 12/03/2020 0841   EOSABS 0.2 12/11/2022 1030   BASOSABS 0.0 12/11/2022 1030    No results found for: "POCLITH", "LITHIUM"   No results found for: "PHENYTOIN", "PHENOBARB", "VALPROATE", "CBMZ"   .res Assessment: Plan:    Treatment Plan/Recommendations:   Plan:  PDMP reviewed  Discontinue Aplenzin 374mg  daily - cost Add Wellbutrin XL 300mg  daily   Add Topamax 25mg  at hs x 7 days, then increase to 50mg  at hs  Will continue: Gabapentin 600mg  at hs Minipress 2mg  at hs Lunesta 2mg  at hs  RTC 4 weeks  Patient advised to contact office with any questions, adverse effects, or acute worsening in signs and symptoms.  Time spent with patient was 20  minutes. Greater than 50% of face to face time with patient was spent on counseling and coordination of care.     There are no diagnoses linked to this encounter.   Please see After Visit Summary for patient specific instructions.  Future Appointments  Date Time Provider Department Center  01/22/2023  2:50 PM Drema [place], DO [facility] None  02/07/2023 11:00 AM Unk Lightning, PA [facility]  03/02/2023  8:40 AM Chilton Si, MD [facility]  10/09/2023  3:00 PM PFM-ANNUAL WELLNESS VISIT [facility]    No orders of the defined types were placed in this encounter.   -------------------------------

## 2023-01-03 NOTE — Telephone Encounter (Signed)
PA

## 2023-01-05 ENCOUNTER — Ambulatory Visit (HOSPITAL_BASED_OUTPATIENT_CLINIC_OR_DEPARTMENT_OTHER): Payer: PPO | Admitting: Cardiovascular Disease

## 2023-01-05 ENCOUNTER — Encounter (HOSPITAL_BASED_OUTPATIENT_CLINIC_OR_DEPARTMENT_OTHER): Payer: Self-pay | Admitting: Cardiovascular Disease

## 2023-01-05 VITALS — BP 120/79 | HR 83 | Ht 65.0 in | Wt 168.4 lb

## 2023-01-05 DIAGNOSIS — I700 Atherosclerosis of aorta: Secondary | ICD-10-CM

## 2023-01-05 DIAGNOSIS — I10 Essential (primary) hypertension: Secondary | ICD-10-CM

## 2023-01-05 DIAGNOSIS — E785 Hyperlipidemia, unspecified: Secondary | ICD-10-CM | POA: Diagnosis not present

## 2023-01-05 HISTORY — DX: Essential (primary) hypertension: I10

## 2023-01-05 LAB — COMPREHENSIVE METABOLIC PANEL
ALT: 23 IU/L (ref 0–32)
AST: 26 IU/L (ref 0–40)
Albumin/Globulin Ratio: 2.4 — ABNORMAL HIGH (ref 1.2–2.2)
Albumin: 4.7 g/dL (ref 3.9–4.9)
Alkaline Phosphatase: 70 IU/L (ref 44–121)
BUN/Creatinine Ratio: 9 — ABNORMAL LOW (ref 12–28)
BUN: 8 mg/dL (ref 8–27)
Bilirubin Total: 0.4 mg/dL (ref 0.0–1.2)
CO2: 23 mmol/L (ref 20–29)
Calcium: 10.1 mg/dL (ref 8.7–10.3)
Chloride: 97 mmol/L (ref 96–106)
Creatinine, Ser: 0.91 mg/dL (ref 0.57–1.00)
Globulin, Total: 2 g/dL (ref 1.5–4.5)
Glucose: 93 mg/dL (ref 70–99)
Potassium: 4.7 mmol/L (ref 3.5–5.2)
Sodium: 135 mmol/L (ref 134–144)
Total Protein: 6.7 g/dL (ref 6.0–8.5)
eGFR: 68 mL/min/{1.73_m2} (ref >59)

## 2023-01-05 LAB — LIPID PANEL
Chol/HDL Ratio: 1.9 ratio (ref 0.0–4.4)
Cholesterol, Total: 194 mg/dL (ref 100–199)
HDL: 104 mg/dL (ref >39)
LDL Chol Calc (NIH): 69 mg/dL (ref 0–99)
Triglycerides: 129 mg/dL (ref 0–149)
VLDL Cholesterol Cal: 21 mg/dL (ref 5–40)

## 2023-01-05 NOTE — Progress Notes (Signed)
Cardiology Office Note   Date:  01/05/2023   ID:  Zena, Weerts Sep 30, 1950, MRN [id number]  PCP:  Ronnald Nian, MD  Cardiologist:   Chilton Si, MD   No chief complaint on file.    History of Present Illness: ASPIN TIMLIN is a 72 y.o. female with familial hyperlipidemia, aortic atherosclerosis, prediabetes, and GERD who presents for follow up.  She was initially seen 07/2019 for management of hyperlipidemia.  Ms. Clouthier reports a history of hyperlipidemia but has been unable to tolerate atorvastatin or rosuvastatin due to myalgias.  She reported exertional dyspnea so she was referred for coronary CT-A 08/2019 that revealed normal coronaries and a calcium score of 0.  However she did have a moderate hiatal hernia.  She had a CT 06/2022 that revealed aortic atherosclerosis.  She was started on Repatha.    History of Present Illness   Today she presents with ongoing struggles related to multiple stressors and health issues. She reports a significant increase in blood pressure, which she attributes to a series of stressful events in 02-11-2021, including the death of her father, a broken wrist that led to six months off work, and subsequent job loss. She also mentions the death of her pet, a fall resulting in dental injuries, and a knee replacement surgery.  The patient's depression worsened after discontinuing Lamictal due to a drop in sodium levels to 121. She reports that her sodium levels have since normalized to 134 after stopping the medication. She is currently on a new medication, Topamax, and is in her second week of treatment.  Regarding physical activity, the patient was regularly exercising four to five days a week post-knee replacement until her depression worsened. Currently, she is managing to exercise one to two days a week but reports a lack of energy and motivation.  The patient has been on Repatha for approximately three and a half months for cholesterol  management and reports no side effects. She has not had her cholesterol checked since starting the medication.  The patient also reports feeling isolated and has attempted to volunteer at several organizations without success. 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       Past Medical History:  Diagnosis Date   Anemia    Anxiety    Arthritis    Complication of anesthesia    past hx. laryngospasm following 2 past surgeries, not recent   Depression    Exertional dyspnea 08/04/2019   GERD (gastroesophageal reflux disease)    Hiatal hernia 10/17/2019   Hyperlipidemia    Insomnia    periodically uses med.   Perforated sigmoid colon (HCC) 04/28/2014   during colonoscopy, "rupture colon"became septic, has colostomy and open surgical wound   Pneumonia    Pre-diabetes    Prediabetes 08/09/2017   Primary hypertension 01/05/2023   Suicide attempt (HCC)    Vitamin D deficiency     Past Surgical History:  Procedure Laterality Date   ABDOMINAL HYSTERECTOMY  1999   TAH/BSO   APPLICATION OF WOUND VAC  04/28/2014   Procedure: APPLICATION OF WOUND VAC;  Surgeon: Emelia Loron, MD;  Location: [facility] OR;  Service: General;;   BLEPHAROPLASTY  February 11, 2009   BREAST SURGERY  Feb 12, 2000   Breast reduction   COLOSTOMY N/A 04/28/2014   Procedure: COLOSTOMY;  Surgeon: Emelia Loron, MD;  Location: [facility] OR;  Service: General;  Laterality: N/A;   COLOSTOMY REVISION N/A 04/28/2014   Procedure: COLON RESECTION SIGMOID;  Surgeon: Emelia Loron, MD;  Location: [facility] OR;  Service: General;  Laterality: N/A;   COLOSTOMY TAKEDOWN N/A 09/02/2014   Procedure: LAPAROSCOPIC COLOSTOMY REVERSAL;  Surgeon: Romie Levee, MD;  Location: [facility] ORS;  Service: General;  Laterality: N/A;   COSMETIC SURGERY     DEBRIDEMENT TENNIS ELBOW     DILATION AND CURETTAGE OF UTERUS     x3, hysteroscopy   ELBOW SURGERY  2007   EYE SURGERY     Blepharoplasty   LAPAROTOMY N/A 04/28/2014   Procedure: EXPLORATORY  LAPAROTOMY,SIGMOID COLECTOMY;  Surgeon: Emelia Loron, MD;  Location: [facility] OR;  Service: General;  Laterality: N/A;   ORIF WRIST FRACTURE Left 02/19/2021   Procedure: Left wrist open reduction internal fixation and repair as indicated;  Surgeon: Bradly Bienenstock, MD;  Location: [facility] OR;  Service: Orthopedics;  Laterality: Left;    REDUCTION MAMMAPLASTY Bilateral    scar and adhesion repair  06/01/15   and liposuction   TOTAL KNEE ARTHROPLASTY Right 06/27/2022   Procedure: RIGHT TOTAL KNEE ARTHROPLASTY;  Surgeon: Marcene Corning, MD;  Location: [facility] ORS;  Service: Orthopedics;  Laterality: Right;   ULNAR TUNNEL RELEASE Left 2010     Current Outpatient Medications  Medication Sig Dispense Refill   amLODipine (NORVASC) 5 MG tablet Take 1 tablet (5 mg total) by mouth daily. 90 tablet 3   buPROPion (WELLBUTRIN XL) 300 MG 24 hr tablet Take 1 tablet (300 mg total) by mouth daily. 30 tablet 2   esomeprazole (NEXIUM) 40 MG capsule Take 1 capsule (40 mg total) by mouth 2 (two) times daily before a meal. 60 capsule 11   eszopiclone (LUNESTA) 2 MG TABS tablet Take 1 tablet (2 mg total) by mouth at bedtime. 30 tablet 2   Evolocumab (REPATHA SURECLICK) 140 MG/ML SOAJ Inject 140 mg into the skin every 14 (fourteen) days. 6 mL 3   Ferrous Sulfate (SLOW FE PO) Take 1 tablet by mouth daily.     gabapentin (NEURONTIN) 600 MG tablet Take 1 tablet (600 mg total) by mouth every evening. 30 tablet 2   metoprolol succinate (TOPROL-XL) 25 MG 24 hr tablet Take 1 tablet (25 mg total) by mouth daily. 30 tablet 5   ondansetron (ZOFRAN-ODT) 4 MG disintegrating tablet Dissolve 1-2 tablets under the tongue every 6 hours as needed 30 tablet 2   prazosin (MINIPRESS) 2 MG capsule Take 1 capsule (2 mg total) by mouth at bedtime. 30 capsule 2   topiramate (TOPAMAX) 25 MG tablet Take 1 tablet (25 mg total) by mouth 2 (two) times daily. 60 tablet 2   Vitamin D, Ergocalciferol, (DRISDOL) 1.25 MG (50000 UNIT) CAPS capsule Take 1  capsule by mouth every 7 days. (Patient taking differently: Take 50,000 Units by mouth every Thursday.) 12 capsule 3   No current facility-administered medications for this visit.    Allergies:   Bee venom and Statins    Social History:  The patient  reports that she quit smoking about 19 years ago. Her smoking use included cigarettes. She smoked an average of .5 packs per day. She has never used smokeless tobacco. She reports current alcohol use of about 14.0 standard drinks of alcohol per week. She reports that she does not currently use drugs after having used the following drugs: Marijuana.   Family History:  The patient's family history includes Cancer in her father, paternal grandfather, and paternal grandmother; Diabetes in her father and mother; Esophageal cancer in her sister; Heart attack in her father; Heart disease in  her father and maternal grandmother; Hyperlipidemia in her brother and father; Hypertension in her father and mother; Other in her father; Stroke in her maternal grandfather.    ROS:  Please see the history of present illness.   Otherwise, review of systems are positive for none.   All other systems are reviewed and negative.    PHYSICAL EXAM: VS:  BP 120/79 (BP Location: Left Arm, Patient Position: Sitting, Cuff Size: Normal)   Pulse 83   Ht 5\' 5"  (1.651 m)   Wt 168 lb 6.4 oz (76.4 kg)   LMP 08/07/1998   SpO2 96%   BMI 28.02 kg/m  , BMI Body mass index is 28.02 kg/m. GENERAL:  Well appearing HEENT:  Pupils equal round and reactive, fundi not visualized, oral mucosa unremarkable NECK:  No jugular venous distention, waveform within normal limits, carotid upstroke brisk and symmetric, no bruits LUNGS:  Clear to auscultation bilaterally HEART:  RRR.  PMI not displaced or sustained,S1 and S2 within normal limits, no S3, no S4, no clicks, no rubs, no murmurs ABD:  Flat, positive bowel sounds normal in frequency in pitch, no bruits, no rebound, no guarding, no  midline pulsatile mass, no hepatomegaly, no splenomegaly EXT:  2 plus pulses throughout, no edema, no cyanosis no clubbing SKIN:  No rashes no nodules NEURO:  Cranial nerves II through XII grossly intact, motor grossly intact throughout PSYCH:  Cognitively intact, oriented to person place and time   EKG:  EKG is not ordered today. The ekg ordered 04/21/19 demonstrates sinus rhythm.  Rate 88 bpm.  Coronary CT-A 09/04/2019: No coronary artery disease.  Coronary calcium score 0.  Moderate hiatal hernia.  Recent Labs: 12/11/2022: ALT 19; BUN 8; Creatinine, Ser 0.85; Hemoglobin 12.3; Platelets 315; Potassium 5.1; Sodium 134; TSH 3.430    Lipid Panel    Component Value Date/Time   CHOL 323 (H) 09/27/2022 1325   TRIG 95 09/27/2022 1325   HDL 119 09/27/2022 1325   CHOLHDL 2.7 09/27/2022 1325   CHOLHDL 3.9 12/03/2020 0841   VLDL 44 (H) 12/03/2020 0841   LDLCALC 189 (H) 09/27/2022 1325   LDLCALC 183 (H) 08/08/2017 1629   LDLDIRECT 186.4 08/04/2010 0827      Wt Readings from Last 3 Encounters:  01/05/23 168 lb 6.4 oz (76.4 kg)  12/11/22 170 lb 6.4 oz (77.3 kg)  11/27/22 170 lb (77.1 kg)      ASSESSMENT AND PLAN:  History of Present Illness   Today she presents with ongoing struggles related to multiple stressors and health issues. She reports a significant increase in blood pressure, which she attributes to a series of stressful events in 2021/01/28, including the death of her father, a broken wrist that led to six months off work, and subsequent job loss. She also mentions the death of her pet, a fall resulting in dental injuries, and a knee replacement surgery.  The patient's depression worsened after discontinuing Lamictal due to a drop in sodium levels to 121. She reports that her sodium levels have since normalized to 134 after stopping the medication. She is currently on a new medication, Topamax, and is in her second week of treatment.  Regarding physical activity, the patient was  regularly exercising four to five days a week post-knee replacement until her depression worsened. Currently, she is managing to exercise one to two days a week but reports a lack of energy and motivation.  The patient has been on Repatha for approximately three and a half months for  cholesterol management and reports no side effects. She has not had her cholesterol checked since starting the medication.  The patient also reports feeling isolated and has attempted to volunteer at several organizations without success. 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       Current medicines are reviewed at length with the patient today.  The patient does not have concerns regarding medicines.  The following changes have been made:  no change  Labs/ tests ordered today include:  Orders Placed This Encounter  Procedures   Comprehensive metabolic panel   Lipid panel   Amb Referral To Provider Referral Exercise Program (P.R.E.P)     Disposition:   FU with Onnie Hatchel C. Duke Salvia, MD, [facility] in 1 year    Signed, Vada Swift C. Duke Salvia, MD, [facility]  01/05/2023 10:11 AM    [facility]

## 2023-01-05 NOTE — Patient Instructions (Signed)
Medication Instructions:  Your physician recommends that you continue on your current medications as directed. Please refer to the Current Medication list given to you today.  *If you need a refill on your cardiac medications before your next appointment, please call your pharmacy*   Lab Work: Your physician recommends that you return for lab work today- Lipid Panel and CMP  If you have labs (blood work) drawn today and your tests are completely normal, you will receive your results only by: MyChart Message (if you have MyChart) OR A paper copy in the mail If you have any lab test that is abnormal or we need to change your treatment, we will call you to review the results.   Follow-Up: At [facility], you and your health needs are our priority.  As part of our continuing mission to provide you with exceptional heart care, we have created designated Provider Care Teams.  These Care Teams include your primary Cardiologist (physician) and Advanced Practice Providers (APPs -  Physician Assistants and Nurse Practitioners) who all work together to provide you with the care you need, when you need it.  We recommend signing up for the patient portal called "MyChart".  Sign up information is provided on this After Visit Summary.  MyChart is used to connect with patients for Virtual Visits (Telemedicine).  Patients are able to view lab/test results, encounter notes, upcoming appointments, etc.  Non-urgent messages can be sent to your provider as well.   To learn more about what you can do with MyChart, go to [email or web address].    Your next appointment:   1 year(s)  Provider:   Chilton Si, MD    Other Instructions

## 2023-01-08 ENCOUNTER — Other Ambulatory Visit (HOSPITAL_BASED_OUTPATIENT_CLINIC_OR_DEPARTMENT_OTHER): Payer: Self-pay

## 2023-01-10 DIAGNOSIS — F331 Major depressive disorder, recurrent, moderate: Secondary | ICD-10-CM | POA: Diagnosis not present

## 2023-01-12 ENCOUNTER — Telehealth: Payer: Self-pay

## 2023-01-12 NOTE — Telephone Encounter (Signed)
Called AO:ZHYQ program referral, explained PREP, she would like to attend next class at [facility] on July 1, every M/W 12:30-1:45; will contact mid June to set up assessment visit

## 2023-01-15 DIAGNOSIS — F331 Major depressive disorder, recurrent, moderate: Secondary | ICD-10-CM | POA: Diagnosis not present

## 2023-01-18 NOTE — Progress Notes (Signed)
NEUROLOGY CONSULTATION NOTE  KHADIJATOU WINTERS MRN: [id number] DOB: 01-22-1951  Referring provider: Sharlot Gowda, MD Primary care provider: Sharlot Gowda, MD  Reason for consult:  headache  Assessment/Plan:   Migraine without aura, without status migrainosus, not intractable  Migraine prevention:   increase topiramate to 75mg  at bedtime (confirmed approval with psychiatry). Migraine rescue:  naproxen 500mg  Limit use of pain relievers to no more than 2 days out of week to prevent risk of rebound or medication-overuse headache. Keep headache diary Follow up 6 months.    Subjective:  [name] is a 72 year old left-handed female with HTN, HLD, aortic atherosclerosis, prediabetes, GERD, and depression who presents for headaches.  History supplemented by referring provider's note.  She had a fall in August 2023 in which she hit her head and broke teeth.  She developed a daily persistent headache after that event.  It has since decreased to 3 days a week.  She started topiramate by psychiatry for her mood, but she maintains headache frequency improved prior to starting topiramate.  They last all day.  She describes a moderate-severe bifrontal/occipital pressure headache associated with nausea, photophobia, phonophobia but no visual disturbance or other aura.  She has tried multiple over the counter analgesics and diclofenac which were ineffective.  However, naproxen seems effective.     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  She does not recall any prior history of significant head trauma.    She has also been experiencing multiple somatic symptoms.  She endorses generalized fatigue, abdominal bloating/swelling, excessive sweating, tachycardia.  These symptoms have been ongoing for awhile and seemed to get worse after right total knee  arthroplasty in November.  CBC was normal with normal iron panel, TSH 3.430, vit D 46.6, B12 415, Hgb A1c 5.7  Past NSAIDS/analgesics:  diclofenac 75mg , Motrin/ibuprofen, Aleve/naproxen Past abortive triptans:  none Past abortive ergotamine:  none Past muscle relaxants:  tizanidine Past anti-emetic:  Zofran Past antihypertensive medications:  prazosin Past antidepressant medications:  venlafaxine Past anticonvulsant medications:  lamotrigine Other past therapies:  none  Current NSAIDS/analgesics:  none Current triptans:  none Current ergotamine:  none Current anti-emetic:  none Current muscle relaxants:  none Current Antihypertensive medications:  metoprolol succinate 25mg  daily, amlodipine Current Antidepressant medications:  Wellbutrin XL 300mg  daily Current Anticonvulsant medications:  topiramate 50mg  at bedtime, gabapentin 600mg  QHS Current anti-CGRP:  none Current Vitamins/Herbal/Supplements:  ferrous sulfate Current Antihistamines/Decongestants:  none Other therapy:  none Birth control:  none Other medications:  Lunesta   Caffeine:  Unsweet iced tea daily.  Diet Pepsi 3 times a week; coffee once every 2-3 weeks. Diet:  32 oz water in evening.  May drink 16-32 oz during day sometimes.  Skips meals Exercise:  gym variably  Depression:  yes; Anxiety:  no Other pain:  right knee pain Sleep hygiene:  Chronic insomnia.  On Lunesta, gabapentin and prozosin which help.  Family history of headache:  no      PAST MEDICAL HISTORY: Past Medical History:  Diagnosis Date   Anemia    Anxiety    Arthritis    Complication of anesthesia    past hx. laryngospasm following 2 past surgeries, not recent   Depression    Exertional dyspnea 08/04/2019   GERD (gastroesophageal reflux disease)    Hiatal hernia 10/17/2019   Hyperlipidemia    Insomnia  periodically uses med.   Perforated sigmoid colon (HCC) 04/28/2014   during colonoscopy, "rupture colon"became septic, has colostomy  and open surgical wound   Pneumonia    Pre-diabetes    Prediabetes 08/09/2017   Primary hypertension 01/05/2023   Suicide attempt (HCC)    Vitamin D deficiency     PAST SURGICAL HISTORY: Past Surgical History:  Procedure Laterality Date   ABDOMINAL HYSTERECTOMY  1999   TAH/BSO   APPLICATION OF WOUND VAC  04/28/2014   Procedure: APPLICATION OF WOUND VAC;  Surgeon: Emelia Loron, MD;  Location: [facility] OR;  Service: General;;   BLEPHAROPLASTY  2010   BREAST SURGERY  2001   Breast reduction   COLOSTOMY N/A 04/28/2014   Procedure: COLOSTOMY;  Surgeon: Emelia Loron, MD;  Location[facility] OR;  Service: General;  Laterality: N/A;   COLOSTOMY REVISION N/A 04/28/2014   Procedure: COLON RESECTION SIGMOID;  Surgeon: Emelia Loron, MD;  Location: [facility] OR;  Service: General;  Laterality: N/A;   COLOSTOMY TAKEDOWN N/A 09/02/2014   Procedure: LAPAROSCOPIC COLOSTOMY REVERSAL;  Surgeon: Romie Levee, MD;  Location: [facility] ORS;  Service: General;  Laterality: N/A;   COSMETIC SURGERY     DEBRIDEMENT TENNIS ELBOW     DILATION AND CURETTAGE OF UTERUS     x3, hysteroscopy   ELBOW SURGERY  2007   EYE SURGERY     Blepharoplasty   LAPAROTOMY N/A 04/28/2014   Procedure: EXPLORATORY LAPAROTOMY,SIGMOID COLECTOMY;  Surgeon: Emelia Loron, MD;  Location: [facility] OR;  Service: General;  Laterality: N/A;   ORIF WRIST FRACTURE Left 02/19/2021   Procedure: Left wrist open reduction internal fixation and repair as indicated;  Surgeon: Bradly Bienenstock, MD;  Location: [facility] OR;  Service: Orthopedics;  Laterality: Left;    REDUCTION MAMMAPLASTY Bilateral    scar and adhesion repair  06/01/15   and liposuction   TOTAL KNEE ARTHROPLASTY Right 06/27/2022   Procedure: RIGHT TOTAL KNEE ARTHROPLASTY;  Surgeon: Marcene Corning, MD;  Location: [facility] ORS;  Service: Orthopedics;  Laterality: Right;   ULNAR TUNNEL RELEASE Left 2010    MEDICATIONS: Current Outpatient Medications on File Prior to Visit  Medication Sig Dispense  Refill   amLODipine (NORVASC) 5 MG tablet Take 1 tablet (5 mg total) by mouth daily. 90 tablet 3   buPROPion (WELLBUTRIN XL) 300 MG 24 hr tablet Take 1 tablet (300 mg total) by mouth daily. 30 tablet 2   esomeprazole (NEXIUM) 40 MG capsule Take 1 capsule (40 mg total) by mouth 2 (two) times daily before a meal. 60 capsule 11   eszopiclone (LUNESTA) 2 MG TABS tablet Take 1 tablet (2 mg total) by mouth at bedtime. 30 tablet 2   Evolocumab (REPATHA SURECLICK) 140 MG/ML SOAJ Inject 140 mg into the skin every 14 (fourteen) days. 6 mL 3   Ferrous Sulfate (SLOW FE PO) Take 1 tablet by mouth daily.     gabapentin (NEURONTIN) 600 MG tablet Take 1 tablet (600 mg total) by mouth every evening. 30 tablet 2   metoprolol succinate (TOPROL-XL) 25 MG 24 hr tablet Take 1 tablet (25 mg total) by mouth daily. 30 tablet 5   ondansetron (ZOFRAN-ODT) 4 MG disintegrating tablet Dissolve 1-2 tablets under the tongue every 6 hours as needed 30 tablet 2   prazosin (MINIPRESS) 2 MG capsule Take 1 capsule (2 mg total) by mouth at bedtime. 30 capsule 2   topiramate (TOPAMAX) 25 MG tablet Take 1 tablet (25 mg total) by mouth 2 (two) times daily. 60 tablet 2  Vitamin D, Ergocalciferol, (DRISDOL) 1.25 MG (50000 UNIT) CAPS capsule Take 1 capsule by mouth every 7 days. (Patient taking differently: Take 50,000 Units by mouth every Thursday.) 12 capsule 3   No current facility-administered medications on file prior to visit.    ALLERGIES: Allergies  Allergen Reactions   Bee Venom Swelling    Yellow jackets   Statins Other (See Comments)    Muscle pains  CRESTOR, LIPITOR    FAMILY HISTORY: Family History  Problem Relation Age of Onset   Hyperlipidemia Father    Hypertension Father    Diabetes Father    Cancer Father        History of bladder cancer   Heart disease Father    Other Father        4 ft intestinal removed-had gangrene in gallbladder, then spread to intestines   Heart attack Father    Hypertension  Mother    Diabetes Mother    Heart disease Maternal Grandmother    Stroke Maternal Grandfather    Hyperlipidemia Brother    Cancer Paternal Grandmother        uterine   Cancer Paternal Grandfather        pancreatic cancer   Esophageal cancer Sister     Objective:  Blood pressure 116/75, pulse 75, height 5\' 8"  (1.727 m), weight 170 lb (77.1 kg), last menstrual period 08/07/1998, SpO2 95 %. General: No acute distress.  Patient appears well-groomed.   Head:  Normocephalic/atraumatic Eyes:  fundi examined but not visualized Neck: supple, no paraspinal tenderness, full range of motion Back: No paraspinal tenderness Heart: regular rate and rhythm Lungs: Clear to auscultation bilaterally. Vascular: No carotid bruits. Neurological Exam: Mental status: alert and oriented to person, place, and time, speech fluent and not dysarthric, language intact. Cranial nerves: CN I: not tested CN II: pupils equal, round and reactive to light, visual fields intact CN III, IV, VI:  full range of motion, no nystagmus, no ptosis CN V: facial sensation intact. CN VII: upper and lower face symmetric CN VIII: hearing intact CN IX, X: gag intact, uvula midline CN XI: sternocleidomastoid and trapezius muscles intact CN XII: tongue midline Bulk & Tone: normal, no fasciculations. Motor:  muscle strength 5/5 throughout Sensation:  Pinprick, temperature and vibratory sensation intact. Deep Tendon Reflexes:  2+ throughout,  toes downgoing.   Finger to nose testing:  Without dysmetria.   Heel to shin:  Without dysmetria.   Gait:  Normal station and stride.  Romberg negative.    Thank you for allowing me to take part in the care of this patient.  Shon Millet, DO  CC: Sharlot Gowda, MD

## 2023-01-22 ENCOUNTER — Encounter: Payer: Self-pay | Admitting: Neurology

## 2023-01-22 ENCOUNTER — Ambulatory Visit: Payer: PPO | Admitting: Neurology

## 2023-01-22 ENCOUNTER — Other Ambulatory Visit (HOSPITAL_BASED_OUTPATIENT_CLINIC_OR_DEPARTMENT_OTHER): Payer: Self-pay

## 2023-01-22 VITALS — BP 116/75 | HR 75 | Ht 68.0 in | Wt 170.0 lb

## 2023-01-22 DIAGNOSIS — G43009 Migraine without aura, not intractable, without status migrainosus: Secondary | ICD-10-CM | POA: Diagnosis not present

## 2023-01-22 MED ORDER — TOPIRAMATE 25 MG PO TABS
75.0000 mg | ORAL_TABLET | Freq: Every day | ORAL | 5 refills | Status: DC
  Filled 2023-01-22: qty 90, 30d supply, fill #0
  Filled 2023-02-18: qty 90, 30d supply, fill #1

## 2023-01-22 MED ORDER — NAPROXEN 500 MG PO TABS
500.0000 mg | ORAL_TABLET | Freq: Two times a day (BID) | ORAL | 5 refills | Status: DC | PRN
  Filled 2023-01-22: qty 15, 8d supply, fill #0

## 2023-01-22 NOTE — Patient Instructions (Addendum)
Will contact psychiatry to find out if we can increase topiramate Take naproxen as needed for headaches but limit to no more than 2 days out of week to prevent rebound headache Keep headache diary Consider taking magnesium citrate 100mg  daily, CoQ10 300mg  daily and riboflavin 400mg  daily Follow up 6 months.

## 2023-01-23 ENCOUNTER — Other Ambulatory Visit (HOSPITAL_BASED_OUTPATIENT_CLINIC_OR_DEPARTMENT_OTHER): Payer: Self-pay

## 2023-01-24 ENCOUNTER — Telehealth: Payer: Self-pay

## 2023-01-24 DIAGNOSIS — F331 Major depressive disorder, recurrent, moderate: Secondary | ICD-10-CM | POA: Diagnosis not present

## 2023-01-24 NOTE — Telephone Encounter (Signed)
Called to confirm participation in next PREP class on July 1 at [facility]; she plans to attend will call me back later today to set up assessment visit appt.

## 2023-01-24 NOTE — Telephone Encounter (Signed)
PREP Assessment Visit scheduled for June 23 at 1:30 at [facility].

## 2023-01-26 ENCOUNTER — Encounter: Payer: Self-pay | Admitting: Adult Health

## 2023-01-26 ENCOUNTER — Other Ambulatory Visit (HOSPITAL_BASED_OUTPATIENT_CLINIC_OR_DEPARTMENT_OTHER): Payer: Self-pay

## 2023-01-26 ENCOUNTER — Ambulatory Visit (INDEPENDENT_AMBULATORY_CARE_PROVIDER_SITE_OTHER): Payer: PPO | Admitting: Adult Health

## 2023-01-26 DIAGNOSIS — F331 Major depressive disorder, recurrent, moderate: Secondary | ICD-10-CM

## 2023-01-26 DIAGNOSIS — G4700 Insomnia, unspecified: Secondary | ICD-10-CM

## 2023-01-26 MED ORDER — BUPROPION HCL ER (XL) 150 MG PO TB24
150.0000 mg | ORAL_TABLET | Freq: Every day | ORAL | 0 refills | Status: DC
Start: 2023-01-26 — End: 2023-04-20
  Filled 2023-01-26: qty 90, 90d supply, fill #0

## 2023-01-26 MED ORDER — BUPROPION HCL ER (XL) 300 MG PO TB24
300.0000 mg | ORAL_TABLET | Freq: Every day | ORAL | 3 refills | Status: DC
Start: 2023-01-26 — End: 2023-07-16
  Filled 2023-01-26 – 2023-02-22 (×2): qty 90, 90d supply, fill #0
  Filled 2023-05-31: qty 90, 90d supply, fill #1

## 2023-01-26 NOTE — Progress Notes (Addendum)
[name] [id number] [birth year] 72 y.o.  Subjective:   Patient ID:  [name] is a 72 y.o. (DOB Aug 15, 1950) female.  Chief Complaint: No chief complaint on file.   HPI CAMI DELAWDER presents to the office today for follow-up of MDD and insomnia.   Describes mood today as "about the same". Pleasant. Reports tearful at times. Mood symptoms - reports depression. Denies anxiety and irritability. Reports worry, rumination, and over thinking - mostly surrounding daughter. Mood is lower. Stating "I still feel sad". Has switched from Aplenzin 374mg  daily to Wellbutrin XL 300mg  has tolerated the change. Varying interest and motivation. Taking medications as prescribed. Working with therapist regularly [place].  Energy levels stable. Active, working in a regular exercise routine. Enjoys some usual interests and activities. Lives alone - 2 cats. Has 2 children son 74 [place], daughter 43 in [place] and 2 grandchildren.  Appetite adequate - eating 3 small meals a day. Weight gain - 170 pounds. Sleeps well most nights. Averages 8 hours during the night. Reports some daytime napping.  Focus and concentration stable. Completing tasks. Managing aspects of household. Previously worked as a Medical laboratory scientific officer. Retired x 2 years ago. Denies SI or HI.  Denies AH or VH. Denies self harm. Denies substance use.   Previous medication trials:  Zoloft, Lamictal - low sodium, Celexa, Wellbutrin, Lexapro, Effexor, Vraylar   PHQ2-9    Flowsheet Row Clinical Support from 10/03/2022 in [facility] Office Visit from 05/08/2022 in [facility] for [facility] at [facility] Office Visit from 04/03/2022 in [facility] for [facility] at [facility] Office Visit from 03/02/2022 in [facility] for [facility] [place] Office Visit from 04/21/2019 in [facility]  PHQ-2 Total Score 3 0 0 0 0  PHQ-9 Total  Score 6 -- -- -- --      Flowsheet Row ED from 09/16/2022 in [facility] at [facility] Admission (Discharged) from 06/27/2022 in [facility] Pre-Admission Testing 60 from 06/14/2022 in [facility]  C-SSRS RISK CATEGORY No Risk No Risk Error: Question 6 not populated        Review of Systems:  Review of Systems  Musculoskeletal:  Negative for gait problem.  Neurological:  Negative for tremors.  Psychiatric/Behavioral:         Please refer to HPI    Medications: I have reviewed the patient's current medications.  Current Outpatient Medications  Medication Sig Dispense Refill   buPROPion (WELLBUTRIN XL) 150 MG 24 hr tablet Take 1 tablet (150 mg total) by mouth daily. 90 tablet 0   amLODipine (NORVASC) 5 MG tablet Take 1 tablet (5 mg total) by mouth daily. 90 tablet 3   buPROPion (WELLBUTRIN XL) 300 MG 24 hr tablet Take 1 tablet (300 mg total) by mouth daily. 90 tablet 3   esomeprazole (NEXIUM) 40 MG capsule Take 1 capsule (40 mg total) by mouth 2 (two) times daily before a meal. 60 capsule 11   eszopiclone (LUNESTA) 2 MG TABS tablet Take 1 tablet (2 mg total) by mouth at bedtime. 30 tablet 2   Evolocumab (REPATHA SURECLICK) 140 MG/ML SOAJ Inject 140 mg into the skin every 14 (fourteen) days. 6 mL 3   Ferrous Sulfate (SLOW FE PO) Take 1 tablet by mouth daily.     gabapentin (NEURONTIN) 600 MG tablet Take 1 tablet (600 mg total) by mouth every evening. 30 tablet 2   metoprolol  succinate (TOPROL-XL) 25 MG 24 hr tablet Take 1 tablet (25 mg total) by mouth daily. 30 tablet 5   naproxen (NAPROSYN) 500 MG tablet Take 1 tablet (500 mg total) by mouth every 12 (twelve) hours as needed. 15 tablet 5   ondansetron (ZOFRAN-ODT) 4 MG disintegrating tablet Dissolve 1-2 tablets under the tongue every 6 hours as needed 30 tablet 2   prazosin (MINIPRESS) 2 MG capsule Take 1 capsule (2 mg total) by mouth at bedtime. 30 capsule 2    topiramate (TOPAMAX) 25 MG tablet Take 3 tablets (75 mg total) by mouth at bedtime. 90 tablet 5   Vitamin D, Ergocalciferol, (DRISDOL) 1.25 MG (50000 UNIT) CAPS capsule Take 1 capsule by mouth every 7 days. (Patient taking differently: Take 50,000 Units by mouth every Thursday.) 12 capsule 3   No current facility-administered medications for this visit.    Medication Side Effects: None  Allergies:  Allergies  Allergen Reactions   Bee Venom Swelling    Yellow jackets   Statins Other (See Comments)    Muscle pains  CRESTOR, LIPITOR    Past Medical History:  Diagnosis Date   Anemia    Anxiety    Arthritis    Complication of anesthesia    past hx. laryngospasm following 2 past surgeries, not recent   Depression    Exertional dyspnea 08/04/2019   GERD (gastroesophageal reflux disease)    Hiatal hernia 10/17/2019   Hyperlipidemia    Insomnia    periodically uses med.   Perforated sigmoid colon (HCC) 04/28/2014   during colonoscopy, "rupture colon"became septic, has colostomy and open surgical wound   Pneumonia    Pre-diabetes    Prediabetes 08/09/2017   Primary hypertension 01/05/2023   Suicide attempt (HCC)    Vitamin D deficiency     Past Medical History, Surgical history, Social history, and Family history were reviewed and updated as appropriate.   Please see review of systems for further details on the patient's review from today.   Objective:   Physical Exam:  LMP 08/07/1998   Physical Exam Constitutional:      General: She is not in acute distress. Musculoskeletal:        General: No deformity.  Neurological:     Mental Status: She is alert and oriented to person, place, and time.     Coordination: Coordination normal.  Psychiatric:        Attention and Perception: Attention and perception normal. She does not perceive auditory or visual hallucinations.        Mood and Affect: Mood normal. Mood is not anxious or depressed. Affect is not labile, blunt,  angry or inappropriate.        Speech: Speech normal.        Behavior: Behavior normal.        Thought Content: Thought content normal. Thought content is not paranoid or delusional. Thought content does not include homicidal or suicidal ideation. Thought content does not include homicidal or suicidal plan.        Cognition and Memory: Cognition and memory normal.        Judgment: Judgment normal.     Comments: Insight intact     Lab Review:     Component Value Date/Time   NA 135 01/05/2023 0940   K 4.7 01/05/2023 0940   CL 97 01/05/2023 0940   CO2 23 01/05/2023 0940   GLUCOSE 93 01/05/2023 0940   GLUCOSE 133 (H) 09/16/2022 1415   BUN 8 01/05/2023 0940  CREATININE 0.91 01/05/2023 0940   CREATININE 0.99 08/08/2017 1629   CALCIUM 10.1 01/05/2023 0940   PROT 6.7 01/05/2023 0940   ALBUMIN 4.7 01/05/2023 0940   AST 26 01/05/2023 0940   ALT 23 01/05/2023 0940   ALKPHOS 70 01/05/2023 0940   BILITOT 0.4 01/05/2023 0940   GFRNONAA 43 (L) 09/16/2022 1415   GFRNONAA 76 04/14/2014 1427   GFRAA 72 08/27/2019 1112   GFRAA 88 04/14/2014 1427       Component Value Date/Time   WBC 5.6 12/11/2022 1030   WBC 8.0 09/16/2022 1415   RBC 3.79 12/11/2022 1030   RBC 4.18 09/16/2022 1415   HGB 12.3 12/11/2022 1030   HGB 11.9 (L) 06/14/2015 1528   HCT 36.4 12/11/2022 1030   PLT 315 12/11/2022 1030   MCV 96 12/11/2022 1030   MCH 32.5 12/11/2022 1030   MCH 31.8 09/16/2022 1415   MCHC 33.8 12/11/2022 1030   MCHC 35.2 09/16/2022 1415   RDW 12.7 12/11/2022 1030   LYMPHSABS 1.5 12/11/2022 1030   MONOABS 0.4 12/03/2020 0841   EOSABS 0.2 12/11/2022 1030   BASOSABS 0.0 12/11/2022 1030    No results found for: "POCLITH", "LITHIUM"   No results found for: "PHENYTOIN", "PHENOBARB", "VALPROATE", "CBMZ"   .res Assessment: Plan:    Treatment Plan/Recommendations:   Plan:  PDMP reviewed  Wellbutrin XL 300mg  daily to 450mg  daily - denies seizure history.  Topamax 75mg  at hs  Will  continue: Gabapentin 600mg  at hs Minipress 2mg  at hs Lunesta 2mg  at hs  Consider Vraylar  Consider Spravato  RTC 4 weeks  Patient advised to contact office with any questions, adverse effects, or acute worsening in signs and symptoms.  Time spent with patient was 20 minutes. Greater than 50% of face to face time with patient was spent on counseling and coordination of care.    Diagnoses and all orders for this visit:  Major depressive disorder, recurrent episode, moderate (HCC) -     buPROPion (WELLBUTRIN XL) 300 MG 24 hr tablet; Take 1 tablet (300 mg total) by mouth daily. -     buPROPion (WELLBUTRIN XL) 150 MG 24 hr tablet; Take 1 tablet (150 mg total) by mouth daily.  Insomnia, unspecified type     Please see After Visit Summary for patient specific instructions.  Future Appointments  Date Time Provider Department Center  02/07/2023 11:00 AM Unk Lightning, [place] [facility]  08/03/2023  2:10 PM Shon Millet R, DO [facility] None  10/09/2023  3:00 PM [facility]-ANNUAL WELLNESS VISIT [facility]    No orders of the defined types were placed in this encounter.   -------------------------------

## 2023-01-30 DIAGNOSIS — F331 Major depressive disorder, recurrent, moderate: Secondary | ICD-10-CM | POA: Diagnosis not present

## 2023-01-31 NOTE — Progress Notes (Signed)
[facility] PREP Evaluation  Patient Details  Name: [name] MRN: [id number] Date of Birth: Aug 22, 1950 Age: 72 y.o. PCP: Ronnald Nian, MD  Vitals:   01/31/23 1400  BP: 118/68  Pulse: 84  SpO2: 99%  Weight: 168 lb 12.8 oz (76.6 kg)     [facility] Eval - 01/31/23 1400       [facility] "PREP" Location   [facility] "PREP" Location [name]      Referral    Referring Provider Duke Salvia    Reason for referral Hypertension;Obesitity/Overweight    Program Start Date 02/05/23      Measurement   Waist Circumference 38.5 inches    Hip Circumference 42.5 inches    Body fat 37 percent      Information for Trainer   Goals --   Lose 10 pounds by end of program; Better nutrition, eating 3 meals/day   Current Exercise --   cardio/strength machines at [facility]   Orthopedic Concerns --   R TKA 11/23   Pertinent Medical History --   HTN   Current Barriers --   possible new job   Medications that affect exercise Beta blocker      Timed Up and Go (TUGS)   Timed Up and Go Low risk <9 seconds      Mobility and Daily Activities   I find it easy to walk up or down two or more flights of stairs. 3    I have no trouble taking out the trash. 3    I do housework such as vacuuming and dusting on my own without difficulty. 4    I can easily lift a gallon of milk (8lbs). 4    I can easily walk a mile. 1    I have no trouble reaching into high cupboards or reaching down to pick up something from the floor. 2    I do not have trouble doing out-door work such as Loss adjuster, chartered, raking leaves, or gardening. 1      Mobility and Daily Activities   I feel younger than my age. 4    I feel independent. 4    I feel energetic. 1    I live an active life.  1    I feel strong. 1    I feel healthy. 1    I feel active as other people my age. 4      How fit and strong are you.   Fit and Strong Total Score 34            Past Medical History:  Diagnosis Date   Anemia    Anxiety    Arthritis     Complication of anesthesia    past hx. laryngospasm following 2 past surgeries, not recent   Depression    Exertional dyspnea 08/04/2019   GERD (gastroesophageal reflux disease)    Hiatal hernia 10/17/2019   Hyperlipidemia    Insomnia    periodically uses med.   Perforated sigmoid colon (HCC) 04/28/2014   during colonoscopy, "rupture colon"became septic, has colostomy and open surgical wound   Pneumonia    Pre-diabetes    Prediabetes 08/09/2017   Primary hypertension 01/05/2023   Suicide attempt (HCC)    Vitamin D deficiency    Past Surgical History:  Procedure Laterality Date   ABDOMINAL HYSTERECTOMY  1999   TAH/BSO   APPLICATION OF WOUND VAC  04/28/2014   Procedure: APPLICATION OF WOUND VAC;  Surgeon: Emelia Loron, MD;  Location: [facility]  OR;  Service: General;;   BLEPHAROPLASTY  2010   BREAST SURGERY  2001   Breast reduction   COLOSTOMY N/A 04/28/2014   Procedure: COLOSTOMY;  Surgeon: Emelia Loron, MD;  [facility] OR;  Service: General;  Laterality: N/A;   COLOSTOMY REVISION N/A 04/28/2014   Procedure: COLON RESECTION SIGMOID;  Surgeon: Emelia Loron, MD;  Location: [facility] OR;  Service: General;  Laterality: N/A;   COLOSTOMY TAKEDOWN N/A 09/02/2014   Procedure: LAPAROSCOPIC COLOSTOMY REVERSAL;  Surgeon: Romie Levee, MD;  Location: [facility] ORS;  Service: General;  Laterality: N/A;   COSMETIC SURGERY     DEBRIDEMENT TENNIS ELBOW     DILATION AND CURETTAGE OF UTERUS     x3, hysteroscopy   ELBOW SURGERY  2007   EYE SURGERY     Blepharoplasty   LAPAROTOMY N/A 04/28/2014   Procedure: EXPLORATORY LAPAROTOMY,SIGMOID COLECTOMY;  Surgeon: Emelia Loron, MD;  Location: [facility] OR;  Service: General;  Laterality: N/A;   ORIF WRIST FRACTURE Left 02/19/2021   Procedure: Left wrist open reduction internal fixation and repair as indicated;  Surgeon: Bradly Bienenstock, MD;  Location: [facility] OR;  Service: Orthopedics;  Laterality: Left;    REDUCTION MAMMAPLASTY Bilateral    scar and adhesion  repair  06/01/15   and liposuction   TOTAL KNEE ARTHROPLASTY Right 06/27/2022   Procedure: RIGHT TOTAL KNEE ARTHROPLASTY;  Surgeon: Marcene Corning, MD;  Location: [facility] ORS;  Service: Orthopedics;  Laterality: Right;   ULNAR TUNNEL RELEASE Left 2010   Social History   Tobacco Use  Smoking Status Former   Packs/day: .5   Types: Cigarettes   Quit date: 09/01/2003   Years since quitting: 19.4  Smokeless Tobacco Never  Tobacco Comments   occasional  To begin PREP class at [facility] on July 1, every M/W 12:30-1:45  Mailynn Everly B Shanetra Blumenstock 01/31/2023, 2:05 PM

## 2023-02-05 NOTE — Progress Notes (Signed)
[facility] PREP Weekly Session  Patient Details  Name: SHAAKIRA MOHRMAN MRN: [id number] Date of Birth: 1951/06/17 Age: 72 y.o. PCP: Ronnald Nian, MD  There were no vitals filed for this visit.   [facility] Weekly seesion - 02/05/23 1400       [facility] "PREP" Location   [facility] "PREP" Location [name]      Weekly Session   Topic Discussed Goal setting and welcome to the program   Introductions, review of notebook, tour of facility   Classes attended to date 1             Yamile Roedl B Laressa Bolinger 02/05/2023, 2:41 PM

## 2023-02-07 ENCOUNTER — Ambulatory Visit: Payer: PPO | Admitting: Physician Assistant

## 2023-02-07 ENCOUNTER — Encounter: Payer: Self-pay | Admitting: Physician Assistant

## 2023-02-07 ENCOUNTER — Other Ambulatory Visit (HOSPITAL_BASED_OUTPATIENT_CLINIC_OR_DEPARTMENT_OTHER): Payer: Self-pay

## 2023-02-07 VITALS — BP 110/68 | HR 87 | Ht 66.0 in | Wt 171.0 lb

## 2023-02-07 DIAGNOSIS — K219 Gastro-esophageal reflux disease without esophagitis: Secondary | ICD-10-CM | POA: Diagnosis not present

## 2023-02-07 DIAGNOSIS — R110 Nausea: Secondary | ICD-10-CM | POA: Diagnosis not present

## 2023-02-07 DIAGNOSIS — R140 Abdominal distension (gaseous): Secondary | ICD-10-CM | POA: Diagnosis not present

## 2023-02-07 DIAGNOSIS — R635 Abnormal weight gain: Secondary | ICD-10-CM | POA: Diagnosis not present

## 2023-02-07 MED ORDER — ONDANSETRON 4 MG PO TBDP
ORAL_TABLET | ORAL | 2 refills | Status: DC
  Filled 2023-02-07: qty 30, 5d supply, fill #0
  Filled 2023-04-06: qty 30, 5d supply, fill #1
  Filled 2023-05-19: qty 30, 5d supply, fill #2

## 2023-02-07 MED ORDER — ESOMEPRAZOLE MAGNESIUM 40 MG PO CPDR
40.0000 mg | DELAYED_RELEASE_CAPSULE | Freq: Two times a day (BID) | ORAL | 11 refills | Status: DC
Start: 2023-02-07 — End: 2023-07-18
  Filled 2023-02-07: qty 60, 30d supply, fill #0
  Filled 2023-03-09: qty 60, 30d supply, fill #1
  Filled 2023-04-06: qty 60, 30d supply, fill #2
  Filled 2023-05-12: qty 60, 30d supply, fill #3

## 2023-02-07 NOTE — Patient Instructions (Addendum)
We have sent the following medications to your pharmacy for you to pick up at your convenience: Nexium, Zofran  Start Miralax 1/2 dose daily and Benefiber 1 dose daily  Follow up as needed   If your blood pressure at your visit was 140/90 or greater, please contact your primary care physician to follow up on this.  _______________________________________________________  If you are age 71 or older, your body mass index should be between 23-30. Your Body mass index is 27.6 kg/m. If this is out of the aforementioned range listed, please consider follow up with your Primary Care Provider.  If you are age 94 or younger, your body mass index should be between 19-25. Your Body mass index is 27.6 kg/m. If this is out of the aformentioned range listed, please consider follow up with your Primary Care Provider.   ________________________________________________________  The [facility] providers would like to encourage you to use [facility] to communicate with providers for non-urgent requests or questions.  Due to long hold times on the telephone, sending your provider a message by [facility] may be a faster and more efficient way to get a response.  Please allow 48 business hours for a response.  Please remember that this is for non-urgent requests.  _______________________________________________________    Thank you for entrusting me with your care and choosing [facility].  Hyacinth Meeker PA-C

## 2023-02-07 NOTE — Progress Notes (Signed)
Chief Complaint: Bloating  Review of pertinent gastrointestinal problems: 1. Routine risk for colon cancer: colonoscopy in [place] in 2006 which was normal with no polyps. She had a surveillance colonoscopy by Dr. Lottie Mussel in 2015 which was complicated by a colonic perforation. colonoscopy by Dr. Christella Hartigan on 08/18/2014 the anus the Hartman's pouch was evaluated and was completely normal-appearing. Via ostomy the remaining colon examined was normal. 2 polyps were found removed and sent to pathology (hyperplastic); recommended repeat screening exam 08/2024. 2. Colon perforated by Dr. Kinnie Scales during colonoscopy 2015: Following the perforation she had a segmental resection, colostomy, and required a wound VAC while in the hospital. colostomy takedown Dr. Romie Levee 08/2015 3. Reflux esophagitis  EGD DR. Christella Hartigan on 08/18/2014. There was a typical-appearing reflux related short segment ulcerative esophagitis and a 3 cm hiatal hernia. BID PPI recommended.  HPI:    [name] is a 72 year old female with a past medical history as listed below including anxiety, depression, GERD and multiple others, previously known to Dr. Orvan Falconer, who presents to clinic today with a complaint of bloating.    06/13/2022 office visit with me.  At that time she was following up for chronic constipation.  She had been recommended to try fiber supplement as well as titrate her MiraLAX.  Refilled Zofran and Nexium 40 twice daily.  Also refilled Famotidine increased to 40 mg nightly.  She was doing much better.  Her constipation was doing well with MiraLAX every 3 days.  At that time continued on Nexium 40 twice daily and Famotidine 40 nightly.  Discussed titrating MiraLAX, half a dose a day or quarter of a dose a day.    01/05/2023 CMP, TSH, vitamin D, iron panel all normal.    01/26/2023 patient followed up with behavioral health in regards to her major depressive disorder.  On Wellbutrin XL 300 mg daily which increased to 450 mg daily  and Topamax 75 mg at bedtime.  Also continued on Gabapentin 600 mg at bedtime, Minipress 2 mg at bedtime and Lunesta 2 mg at bedtime.    Today, patient's primary concern is over weight gain and bloating.  Patient describes that she feels bloated and sometimes at night she feels so bloated that it hurts and she will take Alka-Seltzer's and Gas-X.  She has not been managing her constipation well.  Still with a bowel movement once every 3 to 4 days and only after MiraLAX.  She never really tried dietary titration of this, never tried a fiber supplement.  Continues with occasional nausea for which she uses Zofran.    [facility], but she is not sure it is quite her population of people.    Denies fever, chills, vomiting or symptoms that awaken her from sleep.  Past Medical History:  Diagnosis Date   Anemia    Anxiety    Arthritis    Complication of anesthesia    past hx. laryngospasm following 2 past surgeries, not recent   Depression    Exertional dyspnea 08/04/2019   GERD (gastroesophageal reflux disease)    Hiatal hernia 10/17/2019   Hyperlipidemia    Insomnia    periodically uses med.   Perforated sigmoid colon (HCC) 04/28/2014   during colonoscopy, "rupture colon"became septic, has colostomy and open surgical wound   Pneumonia    Pre-diabetes    Prediabetes 08/09/2017   Primary hypertension 01/05/2023   Suicide attempt (HCC)    Vitamin D deficiency  Past Surgical History:  Procedure Laterality Date   ABDOMINAL HYSTERECTOMY  1999   TAH/BSO   APPLICATION OF WOUND VAC  04/28/2014   Procedure: APPLICATION OF WOUND VAC;  Surgeon: Emelia Loron, MD;  [facility] OR;  Service: General;;   BLEPHAROPLASTY  2010   BREAST SURGERY  2001   Breast reduction   COLOSTOMY N/A 04/28/2014   Procedure: COLOSTOMY;  Surgeon: Emelia Loron, MD;  Location: [facility] OR;  Service: General;  Laterality: N/A;   COLOSTOMY REVISION N/A 04/28/2014    Procedure: COLON RESECTION SIGMOID;  Surgeon: Emelia Loron, MD;  Location[facility] OR;  Service: General;  Laterality: N/A;   COLOSTOMY TAKEDOWN N/A 09/02/2014   Procedure: LAPAROSCOPIC COLOSTOMY REVERSAL;  Surgeon: Romie Levee, MD;  [facility]S;  Service: General;  Laterality: N/A;   COSMETIC SURGERY     DEBRIDEMENT TENNIS ELBOW     DILATION AND CURETTAGE OF UTERUS     x3, hysteroscopy   ELBOW SURGERY  2007   EYE SURGERY     Blepharoplasty   LAPAROTOMY N/A 04/28/2014   Procedure: EXPLORATORY LAPAROTOMY,SIGMOID COLECTOMY;  Surgeon: Emelia Loron, MD;  Location: [facility] OR;  Service: General;  Laterality: N/A;   ORIF WRIST FRACTURE Left 02/19/2021   Procedure: Left wrist open reduction internal fixation and repair as indicated;  Surgeon: Bradly Bienenstock, MD;  Location: [facility] OR;  Service: Orthopedics;  Laterality: Left;    REDUCTION MAMMAPLASTY Bilateral    scar and adhesion repair  06/01/15   and liposuction   TOTAL KNEE ARTHROPLASTY Right 06/27/2022   Procedure: RIGHT TOTAL KNEE ARTHROPLASTY;  Surgeon: Marcene Corning, MD;  Location: [facility] ORS;  Service: Orthopedics;  Laterality: Right;   ULNAR TUNNEL RELEASE Left 2010    Current Outpatient Medications  Medication Sig Dispense Refill   amLODipine (NORVASC) 5 MG tablet Take 1 tablet (5 mg total) by mouth daily. 90 tablet 3   buPROPion (WELLBUTRIN XL) 150 MG 24 hr tablet Take 1 tablet (150 mg total) by mouth daily. 90 tablet 0   buPROPion (WELLBUTRIN XL) 300 MG 24 hr tablet Take 1 tablet (300 mg total) by mouth daily. 90 tablet 3   esomeprazole (NEXIUM) 40 MG capsule Take 1 capsule (40 mg total) by mouth 2 (two) times daily before a meal. 60 capsule 11   eszopiclone (LUNESTA) 2 MG TABS tablet Take 1 tablet (2 mg total) by mouth at bedtime. 30 tablet 2   Evolocumab (REPATHA SURECLICK) 140 MG/ML SOAJ Inject 140 mg into the skin every 14 (fourteen) days. 6 mL 3   Ferrous Sulfate (SLOW FE PO) Take 1 tablet by mouth daily.     gabapentin  (NEURONTIN) 600 MG tablet Take 1 tablet (600 mg total) by mouth every evening. 30 tablet 2   metoprolol succinate (TOPROL-XL) 25 MG 24 hr tablet Take 1 tablet (25 mg total) by mouth daily. 30 tablet 5   naproxen (NAPROSYN) 500 MG tablet Take 1 tablet (500 mg total) by mouth every 12 (twelve) hours as needed. 15 tablet 5   ondansetron (ZOFRAN-ODT) 4 MG disintegrating tablet Dissolve 1-2 tablets under the tongue every 6 hours as needed 30 tablet 2   prazosin (MINIPRESS) 2 MG capsule Take 1 capsule (2 mg total) by mouth at bedtime. 30 capsule 2   topiramate (TOPAMAX) 25 MG tablet Take 3 tablets (75 mg total) by mouth at bedtime. 90 tablet 5   Vitamin D, Ergocalciferol, (DRISDOL) 1.25 MG (50000 UNIT) CAPS capsule Take 1 capsule by mouth every 7 days. (  Patient taking differently: Take 50,000 Units by mouth every Thursday.) 12 capsule 3   No current facility-administered medications for this visit.    Allergies as of 02/07/2023 - Review Complete 02/07/2023  Allergen Reaction Noted   Bee venom Swelling 05/06/2013   Statins Other (See Comments) 10/18/2012    Family History  Problem Relation Age of Onset   Hypertension Mother    Diabetes Mother    Dementia Father    Hyperlipidemia Father    Hypertension Father    Diabetes Father    Cancer Father        History of bladder cancer   Heart disease Father    Other Father        4 ft intestinal removed-had gangrene in gallbladder, then spread to intestines   Heart attack Father    Esophageal cancer Sister    Hyperlipidemia Brother    Heart disease Maternal Grandmother    Stroke Maternal Grandfather    Cancer Paternal Grandmother        uterine   Cancer Paternal Grandfather        pancreatic cancer   Parkinsonism Maternal Uncle    Colon cancer Neg Hx     Social History   Socioeconomic History   Marital status: Divorced    Spouse name: Not on file   Number of children: 2   Years of education: Not on file   Highest education level:  Not on file  Occupational History   Occupation: retired  Tobacco Use   Smoking status: Former    Packs/day: .5    Types: Cigarettes    Quit date: 09/01/2003    Years since quitting: 19.4   Smokeless tobacco: Never   Tobacco comments:    occasional  Vaping Use   Vaping Use: Every day   Substances: Nicotine, Nicotine-salt  Substance and Sexual Activity   Alcohol use: Yes    Alcohol/week: 14.0 standard drinks of alcohol    Types: 14 Cans of beer per week    Comment: occas.   Drug use: Not Currently    Types: Marijuana   Sexual activity: Not Currently    Birth control/protection: Surgical    Comment: TAH/BSO  Other Topics Concern   Not on file  Social History Narrative   College Degree   Registered Nurse   Exercises   Left handed                                                               Social Determinants of Health   Financial Resource Strain: Low Risk  (10/03/2022)   Overall Financial Resource Strain (CARDIA)    Difficulty of Paying Living Expenses: Not hard at all  Food Insecurity: No Food Insecurity (10/03/2022)   Hunger Vital Sign    Worried About Running Out of Food in the Last Year: Never true    Ran Out of Food in the Last Year: Never true  Transportation Needs: No Transportation Needs (10/03/2022)   PRAPARE - Administrator, Civil Service (Medical): No    Lack of Transportation (Non-Medical): No  Physical Activity: Sufficiently Active (10/03/2022)   Exercise Vital Sign    Days of Exercise per Week: 6 days    Minutes of Exercise per Session: 60 min  Stress: No  Stress Concern Present (10/03/2022)   [employer] of Occupational Health - Occupational Stress Questionnaire    Feeling of Stress : Not at all  Social Connections: Not on file  Intimate Partner Violence: Not At Risk (06/27/2022)   Humiliation, Afraid, Rape, and Kick questionnaire    Fear of Current or Ex-Partner: No    Emotionally Abused: No    Physically Abused:  No    Sexually Abused: No    Review of Systems:    Constitutional: No weight loss, fever or chills Cardiovascular: No chest pain Respiratory: No SOB  Gastrointestinal: See HPI and otherwise negative   Physical Exam:  Vital signs: BP 110/68   Pulse 87   Ht 5\' 6"  (1.676 m)   Wt 171 lb (77.6 kg)   LMP 08/07/1998   SpO2 96%   BMI 27.60 kg/m    Constitutional:   Pleasant Caucasian female appears to be in NAD, Well developed, Well nourished, alert and cooperative Respiratory: Respirations even and unlabored. Lungs clear to auscultation bilaterally.   No wheezes, crackles, or rhonchi.  Cardiovascular: Normal S1, S2. No MRG. Regular rate and rhythm. No peripheral edema, cyanosis or pallor.  Gastrointestinal:  Soft, nondistended, nontender. No rebound or guarding. Normal bowel sounds. No appreciable masses or hepatomegaly. Psychiatric:  Demonstrates good judgement and reason without abnormal affect or behaviors.  RELEVANT LABS AND IMAGING: CBC    Component Value Date/Time   WBC 5.6 12/11/2022 1030   WBC 8.0 09/16/2022 1415   RBC 3.79 12/11/2022 1030   RBC 4.18 09/16/2022 1415   HGB 12.3 12/11/2022 1030   HGB 11.9 (L) 06/14/2015 1528   HCT 36.4 12/11/2022 1030   PLT 315 12/11/2022 1030   MCV 96 12/11/2022 1030   MCH 32.5 12/11/2022 1030   MCH 31.8 09/16/2022 1415   MCHC 33.8 12/11/2022 1030   MCHC 35.2 09/16/2022 1415   RDW 12.7 12/11/2022 1030   LYMPHSABS 1.5 12/11/2022 1030   MONOABS 0.4 12/03/2020 0841   EOSABS 0.2 12/11/2022 1030   BASOSABS 0.0 12/11/2022 1030    CMP     Component Value Date/Time   NA 135 01/05/2023 0940   K 4.7 01/05/2023 0940   CL 97 01/05/2023 0940   CO2 23 01/05/2023 0940   GLUCOSE 93 01/05/2023 0940   GLUCOSE 133 (H) 09/16/2022 1415   BUN 8 01/05/2023 0940   CREATININE 0.91 01/05/2023 0940   CREATININE 0.99 08/08/2017 1629   CALCIUM 10.1 01/05/2023 0940   PROT 6.7 01/05/2023 0940   ALBUMIN 4.7 01/05/2023 0940   AST 26 01/05/2023 0940    ALT 23 01/05/2023 0940   ALKPHOS 70 01/05/2023 0940   BILITOT 0.4 01/05/2023 0940   GFRNONAA 43 (L) 09/16/2022 1415   GFRNONAA 76 04/14/2014 1427   GFRAA 72 08/27/2019 1112   GFRAA 88 04/14/2014 1427    Assessment: 1.  Bloating: Likely related to known constipation 2.  Weight gain: Multifactorial, discussed this with the patient 3.  Nausea: Chronic and managed with as needed Zofran 4.  GERD: Maintained on Nexium  Plan: 1.  Refilled Nexium and Zofran-both good for a year 2.  Rediscussed MiraLAX and titration of this.  Recommend she start half a dose a day with the Benefiber supplement 1-2 times daily. 3.  Discussed weight loss, discussed possible high-protein 30 to 40 g per meal versus other options.  Encouraged exercise and weight lifting. 4.  Patient to follow in clinic as needed.  Assigned to Dr. Chales Abrahams today.  Hyacinth Meeker,  PA-C [facility] 02/07/2023, 10:58 AM  Cc: Ronnald Nian, MD

## 2023-02-09 ENCOUNTER — Other Ambulatory Visit: Payer: Self-pay

## 2023-02-12 NOTE — Progress Notes (Signed)
[facility] PREP Weekly Session  Patient Details  Name: [name] MRN: [id number] Date of Birth: Dec 25, 1950 Age: 72 y.o. PCP: Ronnald Nian, MD  Vitals:   02/12/23 1353  Weight: 170 lb 9.6 oz (77.4 kg)     [facility] Weekly seesion - 02/12/23 1300       [facility] "PREP" Location   [facility] "PREP" Location [name]      Weekly Session   Topic Discussed Importance of resistance training;Other ways to be active   Goals: work up to 150 minutes/wk of cardio; strength training 2-3 times/wk for 20-40 minutes; sitting no more than 30 minutes   Minutes exercised this week 70 minutes    Classes attended to date 3             [name] B [name] 02/12/2023, 1:54 PM

## 2023-02-15 DIAGNOSIS — F331 Major depressive disorder, recurrent, moderate: Secondary | ICD-10-CM | POA: Diagnosis not present

## 2023-02-19 ENCOUNTER — Other Ambulatory Visit (HOSPITAL_BASED_OUTPATIENT_CLINIC_OR_DEPARTMENT_OTHER): Payer: Self-pay

## 2023-02-19 NOTE — Progress Notes (Signed)
[facility] PREP Weekly Session  Patient Details  Name: [name] MRN: [id number] Date of Birth: 05-24-1951 Age: 72 y.o. PCP: Ronnald Nian, MD  Vitals:   02/19/23 1349  Weight: 168 lb 9.6 oz (76.5 kg)     [facility] Weekly seesion - 02/19/23 1300       [facility] "PREP" Location   [facility] "PREP" Location [name]      Weekly Session   Minutes exercised this week 150 minutes    Classes attended to date 5             [name] 02/19/2023, 1:49 PM

## 2023-02-21 DIAGNOSIS — F331 Major depressive disorder, recurrent, moderate: Secondary | ICD-10-CM | POA: Diagnosis not present

## 2023-02-22 ENCOUNTER — Other Ambulatory Visit (HOSPITAL_BASED_OUTPATIENT_CLINIC_OR_DEPARTMENT_OTHER): Payer: Self-pay

## 2023-02-22 ENCOUNTER — Other Ambulatory Visit: Payer: Self-pay | Admitting: Adult Health

## 2023-02-22 ENCOUNTER — Other Ambulatory Visit: Payer: Self-pay

## 2023-02-22 DIAGNOSIS — F331 Major depressive disorder, recurrent, moderate: Secondary | ICD-10-CM

## 2023-02-22 NOTE — Telephone Encounter (Signed)
Has appt. tomorrow

## 2023-02-23 ENCOUNTER — Ambulatory Visit (INDEPENDENT_AMBULATORY_CARE_PROVIDER_SITE_OTHER): Payer: PPO | Admitting: Adult Health

## 2023-02-23 ENCOUNTER — Encounter: Payer: Self-pay | Admitting: Adult Health

## 2023-02-23 ENCOUNTER — Other Ambulatory Visit (HOSPITAL_BASED_OUTPATIENT_CLINIC_OR_DEPARTMENT_OTHER): Payer: Self-pay

## 2023-02-23 DIAGNOSIS — F331 Major depressive disorder, recurrent, moderate: Secondary | ICD-10-CM

## 2023-02-23 DIAGNOSIS — G4700 Insomnia, unspecified: Secondary | ICD-10-CM

## 2023-02-23 MED ORDER — TOPIRAMATE 25 MG PO TABS
75.0000 mg | ORAL_TABLET | Freq: Every day | ORAL | 3 refills | Status: DC
  Filled 2023-02-23: qty 270, fill #0
  Filled 2023-03-09 – 2023-03-19 (×3): qty 270, 90d supply, fill #0
  Filled 2023-06-17: qty 270, 90d supply, fill #1

## 2023-02-23 MED ORDER — PRAZOSIN HCL 2 MG PO CAPS
2.0000 mg | ORAL_CAPSULE | Freq: Every day | ORAL | 3 refills | Status: DC
Start: 2023-02-23 — End: 2024-01-24
  Filled 2023-02-23 – 2023-03-10 (×3): qty 90, 90d supply, fill #0
  Filled 2023-06-07: qty 90, 90d supply, fill #1
  Filled 2023-09-05: qty 90, 90d supply, fill #2
  Filled 2023-12-04: qty 90, 90d supply, fill #3

## 2023-02-23 MED ORDER — ESZOPICLONE 2 MG PO TABS
2.0000 mg | ORAL_TABLET | Freq: Every evening | ORAL | 2 refills | Status: DC
Start: 2023-02-23 — End: 2023-04-26
  Filled 2023-02-23 – 2023-03-10 (×2): qty 30, 30d supply, fill #0
  Filled 2023-04-13: qty 30, 30d supply, fill #1

## 2023-02-23 NOTE — Progress Notes (Signed)
[name] [id number] 11/02/1950 72 y.o.  Subjective:   Patient ID:  [name] is a 72 y.o. (DOB 05-28-1951) female.  Chief Complaint: No chief complaint on file.   HPI [name] presents to the office today for follow-up of MDD and insomnia.   Describes mood today as "about the same". Pleasant. Reports tearful at times. Mood symptoms - denies depression - feels unmotivated. Denies anxiety and irritability. Reports worry, rumination, and over thinking - surrounding daughter. Mood is medium. Stating "I feel like I'm generally ok". Feels like medications are helpful. Taking medications as prescribed. Working with therapist regularly [place].  Energy levels lower. Active, working on a regular exercise routine - 2 days a week. Enjoys some usual interests and activities. Lives alone - 2 cats. Has 2 children son 27 [place], daughter 84 in [place] and 2 grandchildren.  Appetite adequate. Weight gain - 168 to 170 pounds. Sleeps well most nights. Averages 8 hours during the night.  Focus and concentration stable. Completing tasks. Managing aspects of household. Previously worked as a Medical laboratory scientific officer. Retired x 2 years ago. Denies SI or HI.  Denies AH or VH. Denies self harm. Denies substance use.  Reports alcohol use.  Previous medication trials:  Zoloft, Lamictal - low sodium, Celexa, Wellbutrin, Lexapro, Effexor, Vraylar   PHQ2-9    Flowsheet Row Clinical Support from 10/03/2022 in [facility] Office Visit from 05/08/2022 in [facility] for [facility] Office Visit from 04/03/2022 in [facility] for [facility] Office Visit from 03/02/2022 in [facility] for [facility] [place] Office Visit from 04/21/2019 in [facility]  PHQ-2 Total Score 3 0 0 0 0  PHQ-9 Total Score 6 -- -- -- --      Flowsheet Row ED from 09/16/2022 in [facility] at [facility] Admission (Discharged) from 06/27/2022 in [facility] Pre-Admission Testing 60 from 06/14/2022 in [facility]  C-SSRS RISK CATEGORY No Risk No Risk Error: Question 6 not populated        Review of Systems:  Review of Systems  Musculoskeletal:  Negative for gait problem.  Neurological:  Negative for tremors.  Psychiatric/Behavioral:         Please refer to HPI    Medications: I have reviewed the patient's current medications.  Current Outpatient Medications  Medication Sig Dispense Refill   amLODipine (NORVASC) 5 MG tablet Take 1 tablet (5 mg total) by mouth daily. 90 tablet 3   buPROPion (WELLBUTRIN XL) 150 MG 24 hr tablet Take 1 tablet (150 mg total) by mouth daily. 90 tablet 0   buPROPion (WELLBUTRIN XL) 300 MG 24 hr tablet Take 1 tablet (300 mg total) by mouth daily. 90 tablet 3   esomeprazole (NEXIUM) 40 MG capsule Take 1 capsule (40 mg total) by mouth 2 (two) times daily before a meal. 60 capsule 11   eszopiclone (LUNESTA) 2 MG TABS tablet Take 1 tablet (2 mg total) by mouth at bedtime. 30 tablet 2   Evolocumab (REPATHA SURECLICK) 140 MG/ML SOAJ Inject 140 mg into the skin every 14 (fourteen) days. 6 mL 3   Ferrous Sulfate (SLOW FE PO) Take 1 tablet by mouth daily.     gabapentin (NEURONTIN) 600 MG tablet Take 1 tablet (600 mg total) by mouth every evening. 30 tablet 2   metoprolol succinate (TOPROL-XL) 25 MG 24 hr tablet Take 1 tablet (  25 mg total) by mouth daily. 30 tablet 5   naproxen (NAPROSYN) 500 MG tablet Take 1 tablet (500 mg total) by mouth every 12 (twelve) hours as needed. 15 tablet 5   ondansetron (ZOFRAN-ODT) 4 MG disintegrating tablet Dissolve 1-2 tablets under the tongue every 6 hours as needed 30 tablet 2   prazosin (MINIPRESS) 2 MG capsule Take 1 capsule (2 mg total) by mouth at bedtime. 30 capsule 2   topiramate (TOPAMAX) 25 MG tablet Take 3 tablets (75 mg total) by mouth at  bedtime. 90 tablet 5   Vitamin D, Ergocalciferol, (DRISDOL) 1.25 MG (50000 UNIT) CAPS capsule Take 1 capsule by mouth every 7 days. (Patient taking differently: Take 50,000 Units by mouth every Thursday.) 12 capsule 3   No current facility-administered medications for this visit.    Medication Side Effects: None  Allergies:  Allergies  Allergen Reactions   Bee Venom Swelling    Yellow jackets   Statins Other (See Comments)    Muscle pains  CRESTOR, LIPITOR    Past Medical History:  Diagnosis Date   Anemia    Anxiety    Arthritis    Complication of anesthesia    past hx. laryngospasm following 2 past surgeries, not recent   Depression    Exertional dyspnea 08/04/2019   GERD (gastroesophageal reflux disease)    Hiatal hernia 10/17/2019   Hyperlipidemia    Insomnia    periodically uses med.   Perforated sigmoid colon (HCC) 04/28/2014   during colonoscopy, "rupture colon"became septic, has colostomy and open surgical wound   Pneumonia    Pre-diabetes    Prediabetes 08/09/2017   Primary hypertension 01/05/2023   Suicide attempt (HCC)    Vitamin D deficiency     Past Medical History, Surgical history, Social history, and Family history were reviewed and updated as appropriate.   Please see review of systems for further details on the patient's review from today.   Objective:   Physical Exam:  LMP 08/07/1998   Physical Exam Constitutional:      General: She is not in acute distress. Musculoskeletal:        General: No deformity.  Neurological:     Mental Status: She is alert and oriented to person, place, and time.     Coordination: Coordination normal.  Psychiatric:        Attention and Perception: Attention and perception normal. She does not perceive auditory or visual hallucinations.        Mood and Affect: Mood normal. Mood is not anxious or depressed. Affect is not labile, blunt, angry or inappropriate.        Speech: Speech normal.        Behavior:  Behavior normal.        Thought Content: Thought content normal. Thought content is not paranoid or delusional. Thought content does not include homicidal or suicidal ideation. Thought content does not include homicidal or suicidal plan.        Cognition and Memory: Cognition and memory normal.        Judgment: Judgment normal.     Comments: Insight intact     Lab Review:     Component Value Date/Time   NA 135 01/05/2023 0940   K 4.7 01/05/2023 0940   CL 97 01/05/2023 0940   CO2 23 01/05/2023 0940   GLUCOSE 93 01/05/2023 0940   GLUCOSE 133 (H) 09/16/2022 1415   BUN 8 01/05/2023 0940   CREATININE 0.91 01/05/2023 0940   CREATININE 0.99  08/08/2017 1629   CALCIUM 10.1 01/05/2023 0940   PROT 6.7 01/05/2023 0940   ALBUMIN 4.7 01/05/2023 0940   AST 26 01/05/2023 0940   ALT 23 01/05/2023 0940   ALKPHOS 70 01/05/2023 0940   BILITOT 0.4 01/05/2023 0940   GFRNONAA 43 (L) 09/16/2022 1415   GFRNONAA 76 04/14/2014 1427   GFRAA 72 08/27/2019 1112   GFRAA 88 04/14/2014 1427       Component Value Date/Time   WBC 5.6 12/11/2022 1030   WBC 8.0 09/16/2022 1415   RBC 3.79 12/11/2022 1030   RBC 4.18 09/16/2022 1415   HGB 12.3 12/11/2022 1030   HGB 11.9 (L) 06/14/2015 1528   HCT 36.4 12/11/2022 1030   PLT 315 12/11/2022 1030   MCV 96 12/11/2022 1030   MCH 32.5 12/11/2022 1030   MCH 31.8 09/16/2022 1415   MCHC 33.8 12/11/2022 1030   MCHC 35.2 09/16/2022 1415   RDW 12.7 12/11/2022 1030   LYMPHSABS 1.5 12/11/2022 1030   MONOABS 0.4 12/03/2020 0841   EOSABS 0.2 12/11/2022 1030   BASOSABS 0.0 12/11/2022 1030    No results found for: "POCLITH", "LITHIUM"   No results found for: "PHENYTOIN", "PHENOBARB", "VALPROATE", "CBMZ"   .res Assessment: Plan:    Treatment Plan/Recommendations:   Plan:  PDMP reviewed  Wellbutrin XL 450mg  daily - denies seizure history.  Topamax 25mg  - 3 at hs  Will continue: Gabapentin 600mg  at hs Minipress 2mg  at hs Lunesta 2mg  at hs  Consider  Vraylar  Consider Spravato  RTC 4 weeks  Patient advised to contact office with any questions, adverse effects, or acute worsening in signs and symptoms.  Time spent with patient was 20 minutes. Greater than 50% of face to face time with patient was spent on counseling and coordination of care.    There are no diagnoses linked to this encounter.   Please see After Visit Summary for patient specific instructions.  Future Appointments  Date Time Provider Department Center  08/03/2023  2:10 PM Shon Millet R, DO [facility] None  10/09/2023  3:00 PM [facility]-ANNUAL WELLNESS VISIT [facility]    No orders of the defined types were placed in this encounter.   -------------------------------

## 2023-02-25 ENCOUNTER — Other Ambulatory Visit (HOSPITAL_BASED_OUTPATIENT_CLINIC_OR_DEPARTMENT_OTHER): Payer: Self-pay

## 2023-02-26 NOTE — Progress Notes (Signed)
[name] PREP Weekly Session  Patient Details  Name: [name] MRN: [id number] Date of Birth: Dec 22, 1950 Age: 72 y.o. PCP: Ronnald Nian, MD  Vitals:   02/26/23 1356  Weight: 170 lb (77.1 kg)     [name] Weekly seesion - 02/26/23 1300       [name] "PREP" Location   [name] "PREP" Location [name]      Weekly Session   Topic Discussed Health habits   Sugar demo; water: 1/2 body wt in oz   Minutes exercised this week 60 minutes    Classes attended to date 7             [name] 02/26/2023, 1:56 PM

## 2023-02-27 DIAGNOSIS — F331 Major depressive disorder, recurrent, moderate: Secondary | ICD-10-CM | POA: Diagnosis not present

## 2023-03-01 NOTE — Progress Notes (Signed)
Agree with assessment/plan.  Raj Gupta, MD [facility] [phone]  

## 2023-03-02 ENCOUNTER — Ambulatory Visit (HOSPITAL_BASED_OUTPATIENT_CLINIC_OR_DEPARTMENT_OTHER): Payer: PPO | Admitting: Cardiovascular Disease

## 2023-03-05 NOTE — Progress Notes (Signed)
[facility] PREP Weekly Session  Patient Details  Name: [name] MRN: [id number] Date of Birth: [birth year] Age: 72 y.o. PCP: Ronnald Nian, MD  Vitals:   03/05/23 1342  Weight: 170 lb (77.1 kg)     [facility] Weekly seesion - 03/05/23 1300       [facility] "PREP" Location   [facility] "PREP" Location [name]      Weekly Session   Topic Discussed Restaurant Eating   limit NA intake to 1500-2300 mg/day; salt demo   Minutes exercised this week 275 minutes    Classes attended to date 77             [name] 03/05/2023, 1:43 PM

## 2023-03-07 DIAGNOSIS — F331 Major depressive disorder, recurrent, moderate: Secondary | ICD-10-CM | POA: Diagnosis not present

## 2023-03-09 ENCOUNTER — Other Ambulatory Visit (HOSPITAL_BASED_OUTPATIENT_CLINIC_OR_DEPARTMENT_OTHER): Payer: Self-pay

## 2023-03-09 ENCOUNTER — Other Ambulatory Visit: Payer: Self-pay

## 2023-03-10 ENCOUNTER — Other Ambulatory Visit (HOSPITAL_BASED_OUTPATIENT_CLINIC_OR_DEPARTMENT_OTHER): Payer: Self-pay

## 2023-03-12 ENCOUNTER — Other Ambulatory Visit: Payer: Self-pay

## 2023-03-12 ENCOUNTER — Other Ambulatory Visit (HOSPITAL_BASED_OUTPATIENT_CLINIC_OR_DEPARTMENT_OTHER): Payer: Self-pay

## 2023-03-12 NOTE — Progress Notes (Signed)
[facility] PREP Weekly Session  Patient Details  Name: [name] MRN: [id number] Date of Birth: Dec 09, 1950 Age: 72 y.o. PCP: Ronnald Nian, MD  Vitals:   03/12/23 1348  Weight: 168 lb 3.2 oz (76.3 kg)     [facility] Weekly seesion - 03/12/23 1300       [facility] "PREP" Location   [facility] "PREP" Location [name]      Weekly Session   Topic Discussed Stress management and problem solving   Importance of sleep; finger tip mudra practice to manage stress   Minutes exercised this week 240 minutes    Classes attended to date 53             [name] Karema Tocci 03/12/2023, 1:49 PM

## 2023-03-19 ENCOUNTER — Other Ambulatory Visit (HOSPITAL_BASED_OUTPATIENT_CLINIC_OR_DEPARTMENT_OTHER): Payer: Self-pay

## 2023-03-19 ENCOUNTER — Other Ambulatory Visit: Payer: Self-pay

## 2023-03-30 DIAGNOSIS — F331 Major depressive disorder, recurrent, moderate: Secondary | ICD-10-CM | POA: Diagnosis not present

## 2023-04-02 NOTE — Progress Notes (Signed)
[facility] PREP Weekly Session  Patient Details  Name: [name] MRN: [id number] Date of Birth: 1951/01/01 Age: 72 y.o. PCP: Ronnald Nian, MD  Vitals:   04/02/23 1344  Weight: 167 lb (75.8 kg)     [facility] Weekly seesion - 04/02/23 1300       [facility] "PREP" Location   [facility] "PREP" Location [name]      Weekly Session   Topic Discussed Other   Portion control; visualize your portion size demo; review of [employer] food label.   Minutes exercised this week 150 minutes    Classes attended to date 65             [name] 04/02/2023, 1:45 PM

## 2023-04-05 DIAGNOSIS — F331 Major depressive disorder, recurrent, moderate: Secondary | ICD-10-CM | POA: Diagnosis not present

## 2023-04-06 ENCOUNTER — Other Ambulatory Visit: Payer: Self-pay

## 2023-04-06 ENCOUNTER — Other Ambulatory Visit (HOSPITAL_BASED_OUTPATIENT_CLINIC_OR_DEPARTMENT_OTHER): Payer: Self-pay | Admitting: Obstetrics & Gynecology

## 2023-04-06 ENCOUNTER — Other Ambulatory Visit (HOSPITAL_BASED_OUTPATIENT_CLINIC_OR_DEPARTMENT_OTHER): Payer: Self-pay

## 2023-04-06 DIAGNOSIS — E559 Vitamin D deficiency, unspecified: Secondary | ICD-10-CM

## 2023-04-10 ENCOUNTER — Telehealth: Payer: Self-pay | Admitting: Family Medicine

## 2023-04-10 DIAGNOSIS — R197 Diarrhea, unspecified: Secondary | ICD-10-CM

## 2023-04-10 NOTE — Telephone Encounter (Signed)
Pt states has had diarrhea at least for 2 weeks straight, & now it's getting worse, now straight water, no appetite, has lost 8 lbs. 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  She would like stool testing to see what's going on.  There are no available appointments today. What do you recommend?

## 2023-04-10 NOTE — Telephone Encounter (Signed)
Pt called back & states she can pick up the testing kit if you want to put in orders, Imodium not helping at all, after eats within an hour has diarrhea.  She is supposed to start a new job tomorrow.

## 2023-04-10 NOTE — Telephone Encounter (Signed)
Called pt & informed her, she will go to [facility] & pick up kit & return, ok per [facility] to pick up from different [facility]

## 2023-04-12 DIAGNOSIS — F331 Major depressive disorder, recurrent, moderate: Secondary | ICD-10-CM | POA: Diagnosis not present

## 2023-04-13 ENCOUNTER — Other Ambulatory Visit: Payer: Self-pay

## 2023-04-17 ENCOUNTER — Telehealth: Payer: Self-pay | Admitting: Physician Assistant

## 2023-04-17 ENCOUNTER — Other Ambulatory Visit: Payer: PPO

## 2023-04-17 DIAGNOSIS — R197 Diarrhea, unspecified: Secondary | ICD-10-CM

## 2023-04-17 DIAGNOSIS — F331 Major depressive disorder, recurrent, moderate: Secondary | ICD-10-CM | POA: Diagnosis not present

## 2023-04-17 NOTE — Telephone Encounter (Signed)
Inbound call from patient requesting to speak with a nurse in regards to her having chronic diarrhea for a month. Patient states she has lost 8-10 pounds and has tried other alternatives such as a imodium. Patient is inquiring if a medication can be prescribed and [facility]. Please advise.

## 2023-04-17 NOTE — Telephone Encounter (Signed)
Unk Lightning, [place]  You3 minutes ago (3:30 PM)   We can have her submit stool studies- gi path panel and then schedule appt to follow up.  Thanks-JLL   Called and informed pt of Jennifer's recommendation for stool test. Pt will pick up kit today and submit sample at her earliest convenience. Patient has been scheduled for a follow up appt with Dr. Chales Abrahams on 05/23/23 at 1:30 pm. Pt verbalized understanding and had no concerns at the end of the call.   Stool study order in epic.

## 2023-04-17 NOTE — Addendum Note (Signed)
Addended by: Missy Sabins on: 04/17/2023 03:50 PM   Modules accepted: Orders

## 2023-04-17 NOTE — Telephone Encounter (Addendum)
Called and spoke with patient. Pt reports that she has had diarrhea for about 1 month. Pt denies any new medications, recent antibiotic use, or being exposed to anyone with similar symptoms. The only thing pt changed was using Stevia instead of Splenda, she stopped this and there was still no improvement in her diarrhea. Patient reports abdominal pain right before having to use the bathroom, denies any nausea or vomiting. Pt reports that the stools are not formed at all, but not quite liquid. Pt reports that one day she had about 4 stools every hour, that day she took Imodium as directed on the box and it slowed things down. Pt reports that she has hardly been eating and noticed about an 8lb weight loss in the last 2 weeks. Pt states that she reached out to her PCP last week and they ordered stool studies. Pt was not able to fill all specimen containers and by the point she could it had been a few days so she threw the samples away. Pt mentioned a hx of C. Diff but she does not think it is C. Diff this time. Pt is willing to come to our office to submit stool studies if needed. Please advise, thanks.

## 2023-04-19 MED ORDER — VITAMIN D (ERGOCALCIFEROL) 1.25 MG (50000 UNIT) PO CAPS
50000.0000 [IU] | ORAL_CAPSULE | ORAL | 3 refills | Status: DC
Start: 2023-04-19 — End: 2024-05-12
  Filled 2023-04-19: qty 12, 84d supply, fill #0
  Filled 2023-07-20: qty 12, 84d supply, fill #1
  Filled 2023-10-18: qty 12, 84d supply, fill #2
  Filled 2024-02-10: qty 12, 84d supply, fill #3

## 2023-04-20 ENCOUNTER — Other Ambulatory Visit: Payer: Self-pay | Admitting: Adult Health

## 2023-04-20 ENCOUNTER — Other Ambulatory Visit (HOSPITAL_BASED_OUTPATIENT_CLINIC_OR_DEPARTMENT_OTHER): Payer: Self-pay

## 2023-04-20 ENCOUNTER — Telehealth: Payer: Self-pay | Admitting: Physician Assistant

## 2023-04-20 DIAGNOSIS — F331 Major depressive disorder, recurrent, moderate: Secondary | ICD-10-CM

## 2023-04-20 NOTE — Telephone Encounter (Signed)
Inbound call from patient requesting a call to discuss stool sample results. Advised that have not yet been reviewed. Patient states she is feeling very sick and it is affecting everything. Patient has not been eating. Please advise, thank you.

## 2023-04-20 NOTE — Telephone Encounter (Signed)
Returned call to patient. Informed pt that stool study results are not back yet. I asked pt if she had tried Imodium at all, pt has only been taking 4 a day, I did tell pt that the max Imodium she can take in a day is 8 tablets. Pt states that she will go purchase new Imodium today as the one she has been taking is about a year old. I advised pt to try this to see if it will help slow her stools down. Advised pt to follow BRAT diet and increase fluid intake. Pt is lactose intolerant so she is not able to tolerate protein drinks. Pt will pick up Gatorade and Pedialyte. Pt knows that we will be in touch with additional recommendations once results have been reviewed. Pt verbalized understanding and had no concerns at the end of the call.

## 2023-04-22 ENCOUNTER — Other Ambulatory Visit (HOSPITAL_BASED_OUTPATIENT_CLINIC_OR_DEPARTMENT_OTHER): Payer: Self-pay

## 2023-04-22 MED ORDER — BUPROPION HCL ER (XL) 150 MG PO TB24
150.0000 mg | ORAL_TABLET | Freq: Every day | ORAL | 0 refills | Status: DC
Start: 2023-04-22 — End: 2023-07-16
  Filled 2023-04-22: qty 90, 90d supply, fill #0

## 2023-04-23 ENCOUNTER — Telehealth: Payer: Self-pay | Admitting: Physician Assistant

## 2023-04-23 ENCOUNTER — Other Ambulatory Visit (HOSPITAL_BASED_OUTPATIENT_CLINIC_OR_DEPARTMENT_OTHER): Payer: Self-pay

## 2023-04-23 ENCOUNTER — Other Ambulatory Visit: Payer: PPO

## 2023-04-23 DIAGNOSIS — R197 Diarrhea, unspecified: Secondary | ICD-10-CM

## 2023-04-23 LAB — SPECIMEN STATUS REPORT

## 2023-04-23 NOTE — Telephone Encounter (Signed)
Patent called regarding a stool sample test she recently dropped off however she states the results say no stool sample received. Please advise.

## 2023-04-23 NOTE — Addendum Note (Signed)
Addended by: Missy Sabins on: 04/23/2023 10:01 AM   Modules accepted: Orders

## 2023-04-23 NOTE — Telephone Encounter (Signed)
Received a return call from [place] in the [facility] lab. They have it documented in their notes that the specimen was sent to [facility], I was informed that they do not follow up on specimen since it was documented that it was sent out on their end. I called [facility] and spoke with Berna Spare. GI profile order was cancelled because the incorrect specimen container was provided to the patient. Berna Spare states that the specimen should have been sent in a [place] orange specimen, but they received a refrigerated stool.  The phlebotomist that provided the patient with the incorrect container is not here today. I was told that Noreene Larsson is the only phlebotomist at this time and she would try to get around to calling patient sometime today. I told Noreene Larsson that I will call the patient myself since her symptoms have been going on for a while.   Called and spoke with patient. I apologized for the inconvenience but she is aware that she was provided the incorrect specimen container for collection. Pt will stop by at her earliest convenience to pick up a new collection kit and will submit sample ASAP. Pt verbalized understanding.   New GI profile order in epic.

## 2023-04-24 ENCOUNTER — Other Ambulatory Visit: Payer: PPO

## 2023-04-24 DIAGNOSIS — R197 Diarrhea, unspecified: Secondary | ICD-10-CM | POA: Diagnosis not present

## 2023-04-24 LAB — GI PROFILE, STOOL, PCR

## 2023-04-25 DIAGNOSIS — F331 Major depressive disorder, recurrent, moderate: Secondary | ICD-10-CM | POA: Diagnosis not present

## 2023-04-26 ENCOUNTER — Ambulatory Visit (INDEPENDENT_AMBULATORY_CARE_PROVIDER_SITE_OTHER): Payer: PPO | Admitting: Adult Health

## 2023-04-26 ENCOUNTER — Encounter: Payer: Self-pay | Admitting: Adult Health

## 2023-04-26 ENCOUNTER — Other Ambulatory Visit (HOSPITAL_BASED_OUTPATIENT_CLINIC_OR_DEPARTMENT_OTHER): Payer: Self-pay

## 2023-04-26 DIAGNOSIS — F331 Major depressive disorder, recurrent, moderate: Secondary | ICD-10-CM | POA: Diagnosis not present

## 2023-04-26 DIAGNOSIS — G4700 Insomnia, unspecified: Secondary | ICD-10-CM | POA: Diagnosis not present

## 2023-04-26 LAB — GI PROFILE, STOOL, PCR

## 2023-04-26 MED ORDER — ESZOPICLONE 2 MG PO TABS
2.0000 mg | ORAL_TABLET | Freq: Every evening | ORAL | 2 refills | Status: DC
Start: 2023-04-26 — End: 2023-11-11
  Filled 2023-04-26 – 2023-05-12 (×2): qty 90, 90d supply, fill #0
  Filled 2023-07-15 – 2023-08-12 (×2): qty 90, 90d supply, fill #1

## 2023-04-26 NOTE — Progress Notes (Signed)
[name] [id number] [birth year] 72 y.o.  Subjective:   Patient ID:  [name] is a 72 y.o. (DOB 02/25/1951) female.  Chief Complaint: No chief complaint on file.   HPI [name] presents to the office today for follow-up of MDD and insomnia.   Describes mood today as "alright". Pleasant. Reports tearfulness at times. Mood symptoms - reports decreased depression. Feels like mood has improved with starting a new job. Denies anxiety and irritability. Reports situational worry, rumination, and over thinking. Mood is "good, but is in pain". Stating "I feel like I'm ok overall". Feels like medications are helpful. Taking medications as prescribed. Working with therapist regularly [place].  Energy levels lower. Active, working on a regular exercise routine - 2 days a week. Enjoys some usual interests and activities. Lives alone - 2 cats. Has 2 children son 23 [place], daughter 72 in [place] and 2 grandchildren.  Appetite adequate. Weight loss 10 pounds - 158 to 170 pounds. Sleeps well most nights. Averages 8 hours during the night.  Focus and concentration stable. Completing tasks. Managing aspects of household. 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  Denies SI or HI.  Denies AH or VH. Denies self harm. Denies substance use.  Reports alcohol use.  Previous medication trials:  Zoloft, Lamictal - low sodium, Celexa, Wellbutrin, Lexapro, Effexor, Vraylar   PHQ2-9    Flowsheet Row Clinical Support from 10/03/2022 in [facility] Office Visit from 05/08/2022 in [facility] for [facility] Office Visit from 04/03/2022 in [facility] for [facility] at [facility] Office Visit from 03/02/2022 in [facility] [place] Office Visit from 04/21/2019 in [facility]  PHQ-2 Total Score 3 0 0 0 0  PHQ-9 Total Score 6 -- -- -- --       Flowsheet Row ED from 09/16/2022 in [facility] at [facility] Admission (Discharged) from 06/27/2022 in [facility] Pre-Admission Testing 60 from 06/14/2022 in [facility]  C-SSRS RISK CATEGORY No Risk No Risk Error: Question 6 not populated        Review of Systems:  Review of Systems  Musculoskeletal:  Negative for gait problem.  Neurological:  Negative for tremors.  Psychiatric/Behavioral:         Please refer to HPI    Medications: I have reviewed the patient's current medications.  Current Outpatient Medications  Medication Sig Dispense Refill   amLODipine (NORVASC) 5 MG tablet Take 1 tablet (5 mg total) by mouth daily. 90 tablet 3   buPROPion (WELLBUTRIN XL) 150 MG 24 hr tablet Take 1 tablet (150 mg total) by mouth daily. 90 tablet 0   buPROPion (WELLBUTRIN XL) 300 MG 24 hr tablet Take 1 tablet (300 mg total) by mouth daily. 90 tablet 3   esomeprazole (NEXIUM) 40 MG capsule Take 1 capsule (40 mg total) by mouth 2 (two) times daily before a meal. 60 capsule 11   eszopiclone (LUNESTA) 2 MG TABS tablet Take 1 tablet (2 mg total) by mouth at bedtime. 90 tablet 2   Evolocumab (REPATHA SURECLICK) 140 MG/ML SOAJ Inject 140 mg into the skin every 14 (fourteen) days. 6 mL 3   Ferrous Sulfate (SLOW FE PO) Take 1 tablet by mouth daily.     gabapentin (NEURONTIN) 600 MG tablet Take 1 tablet (600 mg total) by mouth every evening. 30 tablet 2   metoprolol succinate (  TOPROL-XL) 25 MG 24 hr tablet Take 1 tablet (25 mg total) by mouth daily. 30 tablet 5   naproxen (NAPROSYN) 500 MG tablet Take 1 tablet (500 mg total) by mouth every 12 (twelve) hours as needed. 15 tablet 5   ondansetron (ZOFRAN-ODT) 4 MG disintegrating tablet Dissolve 1-2 tablets under the tongue every 6 hours as needed 30 tablet 2   prazosin (MINIPRESS) 2 MG capsule Take 1 capsule (2 mg total) by mouth at bedtime. 90 capsule 3   topiramate (TOPAMAX) 25 MG  tablet Take 3 tablets (75 mg total) by mouth at bedtime. 270 tablet 3   Vitamin D, Ergocalciferol, (DRISDOL) 1.25 MG (50000 UNIT) CAPS capsule Take 1 capsule by mouth every 7 days. 12 capsule 3   No current facility-administered medications for this visit.    Medication Side Effects: None  Allergies:  Allergies  Allergen Reactions   Bee Venom Swelling    Yellow jackets   Statins Other (See Comments)    Muscle pains  CRESTOR, LIPITOR    Past Medical History:  Diagnosis Date   Anemia    Anxiety    Arthritis    Complication of anesthesia    past hx. laryngospasm following 2 past surgeries, not recent   Depression    Exertional dyspnea 08/04/2019   GERD (gastroesophageal reflux disease)    Hiatal hernia 10/17/2019   Hyperlipidemia    Insomnia    periodically uses med.   Perforated sigmoid colon (HCC) 04/28/2014   during colonoscopy, "rupture colon"became septic, has colostomy and open surgical wound   Pneumonia    Pre-diabetes    Prediabetes 08/09/2017   Primary hypertension 01/05/2023   Suicide attempt (HCC)    Vitamin D deficiency     Past Medical History, Surgical history, Social history, and Family history were reviewed and updated as appropriate.   Please see review of systems for further details on the patient's review from today.   Objective:   Physical Exam:  LMP 08/07/1998   Physical Exam Constitutional:      General: She is not in acute distress. Musculoskeletal:        General: No deformity.  Neurological:     Mental Status: She is alert and oriented to person, place, and time.     Coordination: Coordination normal.  Psychiatric:        Attention and Perception: Attention and perception normal. She does not perceive auditory or visual hallucinations.        Mood and Affect: Mood normal. Mood is not anxious or depressed. Affect is not labile, blunt, angry or inappropriate.        Speech: Speech normal.        Behavior: Behavior normal.         Thought Content: Thought content normal. Thought content is not paranoid or delusional. Thought content does not include homicidal or suicidal ideation. Thought content does not include homicidal or suicidal plan.        Cognition and Memory: Cognition and memory normal.        Judgment: Judgment normal.     Comments: Insight intact     Lab Review:     Component Value Date/Time   NA 135 01/05/2023 0940   K 4.7 01/05/2023 0940   CL 97 01/05/2023 0940   CO2 23 01/05/2023 0940   GLUCOSE 93 01/05/2023 0940   GLUCOSE 133 (H) 09/16/2022 1415   BUN 8 01/05/2023 0940   CREATININE 0.91 01/05/2023 0940   CREATININE 0.99 08/08/2017  1629   CALCIUM 10.1 01/05/2023 0940   PROT 6.7 01/05/2023 0940   ALBUMIN 4.7 01/05/2023 0940   AST 26 01/05/2023 0940   ALT 23 01/05/2023 0940   ALKPHOS 70 01/05/2023 0940   BILITOT 0.4 01/05/2023 0940   GFRNONAA 43 (L) 09/16/2022 1415   GFRNONAA 76 04/14/2014 1427   GFRAA 72 08/27/2019 1112   GFRAA 88 04/14/2014 1427       Component Value Date/Time   WBC 5.6 12/11/2022 1030   WBC 8.0 09/16/2022 1415   RBC 3.79 12/11/2022 1030   RBC 4.18 09/16/2022 1415   HGB 12.3 12/11/2022 1030   HGB 11.9 (L) 06/14/2015 1528   HCT 36.4 12/11/2022 1030   PLT 315 12/11/2022 1030   MCV 96 12/11/2022 1030   MCH 32.5 12/11/2022 1030   MCH 31.8 09/16/2022 1415   MCHC 33.8 12/11/2022 1030   MCHC 35.2 09/16/2022 1415   RDW 12.7 12/11/2022 1030   LYMPHSABS 1.5 12/11/2022 1030   MONOABS 0.4 12/03/2020 0841   EOSABS 0.2 12/11/2022 1030   BASOSABS 0.0 12/11/2022 1030    No results found for: "POCLITH", "LITHIUM"   No results found for: "PHENYTOIN", "PHENOBARB", "VALPROATE", "CBMZ"   .res Assessment: Plan:     Treatment Plan/Recommendations:   Plan:  PDMP reviewed  Wellbutrin XL 450mg  daily - denies seizure history.  Topamax 25mg  - 3 at hs  Will continue: Gabapentin 600mg  at hs Minipress 2mg  at hs Lunesta 2mg  at hs  Consider Vraylar  Consider  Spravato  RTC 4 weeks  Patient advised to contact office with any questions, adverse effects, or acute worsening in signs and symptoms.  Time spent with patient was 20 minutes. Greater than 50% of face to face time with patient was spent on counseling and coordination of care.    Diagnoses and all orders for this visit:  Major depressive disorder, recurrent episode, moderate (HCC)  Insomnia, unspecified type -     eszopiclone (LUNESTA) 2 MG TABS tablet; Take 1 tablet (2 mg total) by mouth at bedtime.     Please see After Visit Summary for patient specific instructions.  Future Appointments  Date Time Provider Department Center  05/23/2023  1:30 PM Lynann Bologna, MD [facility]  08/03/2023  2:10 PM Drema Dallas, DO [facility] None  10/09/2023  3:00 PM P[facility]-ANNUAL WELLNESS VISIT [facility]    No orders of the defined types were placed in this encounter.   -------------------------------

## 2023-04-26 NOTE — Telephone Encounter (Signed)
Inbound call from patient in regards to previous note. Please advise.

## 2023-04-27 MED ORDER — HYOSCYAMINE SULFATE 0.125 MG SL SUBL: 0.1250 mg | SUBLINGUAL_TABLET | Freq: Four times a day (QID) | SUBLINGUAL | 2 refills | Status: DC | PRN

## 2023-04-27 NOTE — Addendum Note (Signed)
Addended by: Missy Sabins on: 04/27/2023 10:03 AM   Modules accepted: Orders

## 2023-04-27 NOTE — Telephone Encounter (Signed)
MyChart message sent to patient with [facility] coupon. [facility] RX sent to [facility] per pt request.

## 2023-04-27 NOTE — Telephone Encounter (Signed)
Unk Lightning, [place] to Me 04/27/23  9:44 AM Happy to give her this if she has found it helpful Hyoscyamine 0.125 mg sublingual tabs 1 tab every 6 hours as needed #120 with 2 refills.  She still needs follow-up in the clinic to discuss.  Thanks, JL L

## 2023-04-27 NOTE — Telephone Encounter (Signed)
Returned call to patient. She reviewed her results via MyChart, but she was not pleased with recommendations after having diarrhea for 7 weeks. Pt states that she was discussing her symptoms with a friend, and her friend has IBS. Pt's friend is prescribed Hyoscyamine PRN for IBS symptoms, pt tried this medication for a few days and had significant relief in addition to Kaopectate since that works better for her than Imodium. Pt has requested a prescription for Hyoscyamine sublingual tablet - she will have to use [facility] coupon at a [facility] since the medication is not on her formulary. Please advise, thanks.

## 2023-05-01 DIAGNOSIS — F331 Major depressive disorder, recurrent, moderate: Secondary | ICD-10-CM | POA: Diagnosis not present

## 2023-05-12 ENCOUNTER — Other Ambulatory Visit: Payer: Self-pay | Admitting: Adult Health

## 2023-05-12 ENCOUNTER — Other Ambulatory Visit (HOSPITAL_BASED_OUTPATIENT_CLINIC_OR_DEPARTMENT_OTHER): Payer: Self-pay

## 2023-05-12 DIAGNOSIS — G4700 Insomnia, unspecified: Secondary | ICD-10-CM

## 2023-05-13 ENCOUNTER — Other Ambulatory Visit (HOSPITAL_BASED_OUTPATIENT_CLINIC_OR_DEPARTMENT_OTHER): Payer: Self-pay

## 2023-05-13 MED ORDER — GABAPENTIN 600 MG PO TABS
600.0000 mg | ORAL_TABLET | Freq: Every evening | ORAL | 0 refills | Status: DC
Start: 2023-05-13 — End: 2023-06-07
  Filled 2023-05-13: qty 30, 30d supply, fill #0

## 2023-05-16 DIAGNOSIS — F331 Major depressive disorder, recurrent, moderate: Secondary | ICD-10-CM | POA: Diagnosis not present

## 2023-05-20 ENCOUNTER — Other Ambulatory Visit (HOSPITAL_BASED_OUTPATIENT_CLINIC_OR_DEPARTMENT_OTHER): Payer: Self-pay

## 2023-05-22 DIAGNOSIS — F331 Major depressive disorder, recurrent, moderate: Secondary | ICD-10-CM | POA: Diagnosis not present

## 2023-05-23 ENCOUNTER — Other Ambulatory Visit (INDEPENDENT_AMBULATORY_CARE_PROVIDER_SITE_OTHER): Payer: PPO

## 2023-05-23 ENCOUNTER — Ambulatory Visit: Payer: PPO | Admitting: Gastroenterology

## 2023-05-23 ENCOUNTER — Encounter: Payer: Self-pay | Admitting: Gastroenterology

## 2023-05-23 ENCOUNTER — Other Ambulatory Visit (HOSPITAL_BASED_OUTPATIENT_CLINIC_OR_DEPARTMENT_OTHER): Payer: Self-pay

## 2023-05-23 VITALS — BP 130/80 | HR 96 | Ht 65.0 in | Wt 161.4 lb

## 2023-05-23 DIAGNOSIS — R197 Diarrhea, unspecified: Secondary | ICD-10-CM

## 2023-05-23 DIAGNOSIS — R109 Unspecified abdominal pain: Secondary | ICD-10-CM

## 2023-05-23 LAB — CBC WITH DIFFERENTIAL/PLATELET
Basophils Absolute: 0 10*3/uL (ref 0.0–0.1)
Basophils Relative: 0.6 % (ref 0.0–3.0)
Eosinophils Absolute: 0.1 10*3/uL (ref 0.0–0.7)
Eosinophils Relative: 1.5 % (ref 0.0–5.0)
HCT: 43.9 % (ref 36.0–46.0)
Hemoglobin: 14.5 g/dL (ref 12.0–15.0)
Lymphocytes Relative: 22.4 % (ref 12.0–46.0)
Lymphs Abs: 1.8 10*3/uL (ref 0.7–4.0)
MCHC: 33 g/dL (ref 30.0–36.0)
MCV: 97.7 fL (ref 78.0–100.0)
Monocytes Absolute: 0.5 10*3/uL (ref 0.1–1.0)
Monocytes Relative: 6.6 % (ref 3.0–12.0)
Neutro Abs: 5.4 10*3/uL (ref 1.4–7.7)
Neutrophils Relative %: 68.9 % (ref 43.0–77.0)
Platelets: 328 10*3/uL (ref 150.0–400.0)
RBC: 4.5 Mil/uL (ref 3.87–5.11)
RDW: 13.4 % (ref 11.5–15.5)
WBC: 7.9 10*3/uL (ref 4.0–10.5)

## 2023-05-23 LAB — COMPREHENSIVE METABOLIC PANEL
ALT: 24 U/L (ref 0–35)
AST: 27 U/L (ref 0–37)
Albumin: 4.5 g/dL (ref 3.5–5.2)
Alkaline Phosphatase: 63 U/L (ref 39–117)
BUN: 7 mg/dL (ref 6–23)
CO2: 23 meq/L (ref 19–32)
Calcium: 9.7 mg/dL (ref 8.4–10.5)
Chloride: 102 meq/L (ref 96–112)
Creatinine, Ser: 0.86 mg/dL (ref 0.40–1.20)
GFR: 68.24 mL/min (ref >60.00)
Glucose, Bld: 108 mg/dL — ABNORMAL HIGH (ref 70–99)
Potassium: 3.7 meq/L (ref 3.5–5.1)
Sodium: 136 meq/L (ref 135–145)
Total Bilirubin: 0.5 mg/dL (ref 0.2–1.2)
Total Protein: 7.4 g/dL (ref 6.0–8.3)

## 2023-05-23 LAB — LIPASE: Lipase: 52 U/L (ref 11.0–59.0)

## 2023-05-23 LAB — HIGH SENSITIVITY CRP: CRP, High Sensitivity: 3.39 mg/L (ref 0.000–5.000)

## 2023-05-23 MED ORDER — DICYCLOMINE HCL 10 MG PO CAPS
10.0000 mg | ORAL_CAPSULE | Freq: Two times a day (BID) | ORAL | 4 refills | Status: DC
  Filled 2023-05-23: qty 60, 30d supply, fill #0
  Filled 2023-06-17: qty 60, 30d supply, fill #1
  Filled 2023-07-20: qty 60, 30d supply, fill #2
  Filled ????-??-??: fill #2

## 2023-05-23 MED ORDER — PANTOPRAZOLE SODIUM 40 MG PO TBEC
40.0000 mg | DELAYED_RELEASE_TABLET | Freq: Every day | ORAL | 3 refills | Status: DC
  Filled 2023-05-23: qty 90, 90d supply, fill #0
  Filled 2023-06-17 – 2023-07-26 (×5): qty 90, 90d supply, fill #1
  Filled ????-??-??: fill #1

## 2023-05-23 NOTE — Patient Instructions (Signed)
Your provider has requested that you go to the basement level for lab work before leaving today. Press "B" on the elevator. The lab is located at the first door on the left as you exit the elevator.  We have sent the following medications to your pharmacy for you to pick up at your convenience:  Protonix, Bentyl  You will be contacted by [facility] Scheduling in the next 2 days to arrange a CT abdomen/pelvis.  The number on your caller ID will be [phone], please answer when they call.  If you have not heard from them in 2 days please call [phone] to schedule.    _______________________________________________________  If your blood pressure at your visit was 140/90 or greater, please contact your primary care physician to follow up on this.  _______________________________________________________  If you are age 86 or older, your body mass index should be between 23-30. Your Body mass index is 26.85 kg/m. If this is out of the aforementioned range listed, please consider follow up with your Primary Care Provider.  If you are age 79 or younger, your body mass index should be between 19-25. Your Body mass index is 26.85 kg/m. If this is out of the aformentioned range listed, please consider follow up with your Primary Care Provider.   ________________________________________________________  The [facility] providers would like to encourage you to use [facility] to communicate with providers for non-urgent requests or questions.  Due to long hold times on the telephone, sending your provider a message by [facility] may be a faster and more efficient way to get a response.  Please allow 48 business hours for a response.  Please remember that this is for non-urgent requests.  _______________________________________________________

## 2023-05-23 NOTE — Progress Notes (Signed)
Chief Complaint: GI problems  Referring Provider:  Ronnald Nian, MD      ASSESSMENT AND PLAN;   #1. Diarrhea. Neg stool for GI pathogens. Prev H/O IBS-C.  #2. Abdo pain with 12lb wt loss over 3 months  Plan: -Change nexium to protonix 40mg  po every day #90, 4RF -CBC, CMP, celiac serology, CRP, lipase. -CT AP with PO/IV contrast -Bentyl 10mg  po BID 1/2hr before meal and bed time #60, 4RF -FU in 3 months.  If still with problems, further workup (at that time, would also check stool for calprotectin, fecal elastase).   HPI:    [name] is a 72 y.o. female  FU from appointment with Hyacinth Meeker 02/07/2023-please see her excellent note.  Previous patient of Dr. Orvan Falconer  Review of pertinent gastrointestinal problems: 1. Routine risk for colon cancer: colonoscopy in [place] in 2006 which was normal with no polyps. She had a surveillance colonoscopy by Dr. Kinnie Scales in 2015 which was complicated by a colonic perforation. colonoscopy by Dr. Christella Hartigan on 08/18/2014 the anus the Hartman's pouch was evaluated and was completely normal-appearing. Via ostomy the remaining colon examined was normal. 2 polyps were found removed and sent to pathology (hyperplastic); recommended repeat screening exam 08/2024. 2. Colon perforated by Dr. Kinnie Scales during colonoscopy 2015: Following the perforation she had a segmental resection, colostomy, and required a wound VAC while in the hospital. colostomy takedown Dr. Romie Levee 08/2015 3. Reflux esophagitis  EGD DR. Christella Hartigan on 08/18/2014. There was a typical-appearing reflux related short segment ulcerative esophagitis and a 3 cm hiatal hernia. BID PPI recommended.   Used to constipation x yrs (Dx with IBS-C) However, over the last 3 months, having diarrhea every day.  Initially she was having 8-10 watery BMs per day, now 1 to 2/day especially after eating Neg stool for GI pathogens Mostly after eating.  She could tolerate only bland food like boiled  chicken.  She has done better with sublingual hyoscyamine. She denies having any significant nocturnal symptoms Only change in the last 3 months is that her Nexium has been changed to generic and she has been started on Topamax for headaches. She does not use any probiotics, magnesium supplements, chewing gums.  No history of travel or recent antibiotics.  Has been having abdominal pain-mostly generalized associated with abdominal bloating which only partially gets relieved with BMs.  Has lost weight-12 pounds over the last 3 months  Absolutely does not want another colonoscopy since she had bad experience in the past as above.  Very anxious about symptoms     Wt Readings from Last 3 Encounters:  05/23/23 161 lb 6 oz (73.2 kg)  04/02/23 167 lb (75.8 kg)  03/12/23 168 lb 3.2 oz (76.3 kg)     Past GI WU: CT Chest 06/2022 1. No evidence of interstitial lung disease. No lung masses or significant pulmonary nodules. 2. Moderate hiatal hernia. Mildly patulous and otherwise normal thoracic esophagus, suggesting chronic esophageal dysmotility. 3.  Aortic Atherosclerosis (ICD10-I70.0).  Past Medical History:  Diagnosis Date   Anemia    Anxiety    Arthritis    Complication of anesthesia    past hx. laryngospasm following 2 past surgeries, not recent   Depression    Exertional dyspnea 08/04/2019   GERD (gastroesophageal reflux disease)    Hiatal hernia 10/17/2019   Hyperlipidemia    Insomnia    periodically uses med.   Perforated sigmoid colon (HCC) 04/28/2014   during colonoscopy, "rupture colon"became septic, has  colostomy and open surgical wound   Pneumonia    Pre-diabetes    Prediabetes 08/09/2017   Primary hypertension 01/05/2023   Suicide attempt (HCC)    Vitamin D deficiency     Past Surgical History:  Procedure Laterality Date   ABDOMINAL HYSTERECTOMY  1999   TAH/BSO   APPLICATION OF WOUND VAC  04/28/2014   Procedure: APPLICATION OF WOUND VAC;  Surgeon: Emelia Loron, MD;  Location: [facility] OR;  Service: General;;   BLEPHAROPLASTY  2010   BREAST SURGERY  2001   Breast reduction   COLOSTOMY N/A 04/28/2014   Procedure: COLOSTOMY;  Surgeon: Emelia Loron, MD;  Location: [facility] OR;  Service: General;  Laterality: N/A;   COLOSTOMY REVISION N/A 04/28/2014   Procedure: COLON RESECTION SIGMOID;  Surgeon: Emelia Loron, MD;  Location: [facility] OR;  Service: General;  Laterality: N/A;   COLOSTOMY TAKEDOWN N/A 09/02/2014   Procedure: LAPAROSCOPIC COLOSTOMY REVERSAL;  Surgeon: Romie Levee, MD;  Location: [facility] ORS;  Service: General;  Laterality: N/A;   COSMETIC SURGERY     DEBRIDEMENT TENNIS ELBOW     DILATION AND CURETTAGE OF UTERUS     x3, hysteroscopy   ELBOW SURGERY  2007   EYE SURGERY     Blepharoplasty   LAPAROTOMY N/A 04/28/2014   Procedure: EXPLORATORY LAPAROTOMY,SIGMOID COLECTOMY;  Surgeon: Emelia Loron, MD;  Location: [facility] OR;  Service: General;  Laterality: N/A;   ORIF WRIST FRACTURE Left 02/19/2021   Procedure: Left wrist open reduction internal fixation and repair as indicated;  Surgeon: Bradly Bienenstock, MD;  Location: [facility] OR;  Service: Orthopedics;  Laterality: Left;    REDUCTION MAMMAPLASTY Bilateral    scar and adhesion repair  06/01/15   and liposuction   TOTAL KNEE ARTHROPLASTY Right 06/27/2022   Procedure: RIGHT TOTAL KNEE ARTHROPLASTY;  Surgeon: Marcene Corning, MD;  Location: [facility] ORS;  Service: Orthopedics;  Laterality: Right;   ULNAR TUNNEL RELEASE Left 2010    Family History  Problem Relation Age of Onset   Hypertension Mother    Diabetes Mother    Dementia Father    Hyperlipidemia Father    Hypertension Father    Diabetes Father    Cancer Father        History of bladder cancer   Heart disease Father    Other Father        4 ft intestinal removed-had gangrene in gallbladder, then spread to intestines   Heart attack Father    Esophageal cancer Sister    Hyperlipidemia Brother    Heart disease Maternal Grandmother     Stroke Maternal Grandfather    Cancer Paternal Grandmother        uterine   Cancer Paternal Grandfather        pancreatic cancer   Parkinsonism Maternal Uncle    Colon cancer Neg Hx     Social History   Tobacco Use   Smoking status: Former    Current packs/day: 0.00    Types: Cigarettes    Quit date: 09/01/2003    Years since quitting: 19.7   Smokeless tobacco: Never   Tobacco comments:    occasional  Vaping Use   Vaping status: Every Day   Substances: Nicotine, Nicotine-salt  Substance Use Topics   Alcohol use: Yes    Alcohol/week: 14.0 standard drinks of alcohol    Types: 14 Cans of beer per week    Comment: occas.   Drug use: Not Currently    Types: Marijuana    Current  Outpatient Medications  Medication Sig Dispense Refill   amLODipine (NORVASC) 5 MG tablet Take 1 tablet (5 mg total) by mouth daily. 90 tablet 3   buPROPion (WELLBUTRIN XL) 150 MG 24 hr tablet Take 1 tablet (150 mg total) by mouth daily. 90 tablet 0   buPROPion (WELLBUTRIN XL) 300 MG 24 hr tablet Take 1 tablet (300 mg total) by mouth daily. 90 tablet 3   esomeprazole (NEXIUM) 40 MG capsule Take 1 capsule (40 mg total) by mouth 2 (two) times daily before a meal. 60 capsule 11   eszopiclone (LUNESTA) 2 MG TABS tablet Take 1 tablet (2 mg total) by mouth at bedtime. 90 tablet 2   Evolocumab (REPATHA SURECLICK) 140 MG/ML SOAJ Inject 140 mg into the skin every 14 (fourteen) days. 6 mL 3   Ferrous Sulfate (SLOW FE PO) Take 1 tablet by mouth daily.     gabapentin (NEURONTIN) 600 MG tablet Take 1 tablet (600 mg total) by mouth every evening. 30 tablet 0   hyoscyamine (LEVSIN SL) 0.125 MG SL tablet Place 1 tablet (0.125 mg total) under the tongue every 6 (six) hours as needed for cramping (diarrhea). 120 tablet 2   metoprolol succinate (TOPROL-XL) 25 MG 24 hr tablet Take 1 tablet (25 mg total) by mouth daily. 30 tablet 5   ondansetron (ZOFRAN-ODT) 4 MG disintegrating tablet Dissolve 1-2 tablets under the tongue  every 6 hours as needed 30 tablet 2   prazosin (MINIPRESS) 2 MG capsule Take 1 capsule (2 mg total) by mouth at bedtime. 90 capsule 3   topiramate (TOPAMAX) 25 MG tablet Take 3 tablets (75 mg total) by mouth at bedtime. 270 tablet 3   Vitamin D, Ergocalciferol, (DRISDOL) 1.25 MG (50000 UNIT) CAPS capsule Take 1 capsule by mouth every 7 days. 12 capsule 3   naproxen (NAPROSYN) 500 MG tablet Take 1 tablet (500 mg total) by mouth every 12 (twelve) hours as needed. (Patient not taking: Reported on 05/23/2023) 15 tablet 5   No current facility-administered medications for this visit.    Allergies  Allergen Reactions   Bee Venom Swelling    Yellow jackets   Statins Other (See Comments)    Muscle pains  CRESTOR, LIPITOR    Review of Systems:  neg     Physical Exam:    BP 130/80 (BP Location: Left Arm, Patient Position: Sitting, Cuff Size: Normal)   Pulse 96   Ht 5\' 5"  (1.651 m)   Wt 161 lb 6 oz (73.2 kg)   LMP 08/07/1998   BMI 26.85 kg/m  Wt Readings from Last 3 Encounters:  05/23/23 161 lb 6 oz (73.2 kg)  04/02/23 167 lb (75.8 kg)  03/12/23 168 lb 3.2 oz (76.3 kg)   Constitutional:  Well-developed, in no acute distress. Psychiatric: Normal mood and affect. Behavior is normal. HEENT: Pupils normal.  Conjunctivae are normal. No scleral icterus. Cardiovascular: Normal rate, regular rhythm. No edema Pulmonary/chest: Effort normal and breath sounds normal. No wheezing, rales or rhonchi. Abdominal: Soft, nondistended. Nontender. Bowel sounds active throughout. There are no masses palpable. No hepatomegaly. Rectal: Deferred Neurological: Alert and oriented to person place and time. Skin: Skin is warm and dry. No rashes noted.  Data Reviewed: I have personally reviewed following labs and imaging studies  CBC:    Latest Ref Rng & Units 12/11/2022   10:30 AM 09/16/2022    2:15 PM 06/14/2022    2:39 PM  CBC  WBC 3.4 - 10.8 x10E3/uL 5.6  8.0  6.5  Hemoglobin 11.1 - 15.9 g/dL 16.1   09.6  04.5   Hematocrit 34.0 - 46.6 % 36.4  37.8  36.2   Platelets 150 - 450 x10E3/uL 315  364  348     CMP:    Latest Ref Rng & Units 01/05/2023    9:40 AM 12/11/2022   10:30 AM 11/27/2022    3:00 PM  CMP  Glucose 70 - 99 mg/dL 93  409  94   BUN 8 - 27 mg/dL 8  8  16    Creatinine 0.57 - 1.00 mg/dL 8.11  9.14  7.82   Sodium 134 - 144 mmol/L 135  134  131   Potassium 3.5 - 5.2 mmol/L 4.7  5.1  4.8   Chloride 96 - 106 mmol/L 97  95  93   CO2 20 - 29 mmol/L 23  23  23    Calcium 8.7 - 10.3 mg/dL 95.6  9.5  9.5   Total Protein 6.0 - 8.5 g/dL 6.7  6.7  6.4   Total Bilirubin 0.0 - 1.2 mg/dL 0.4  <2.1  0.4   Alkaline Phos 44 - 121 IU/L 70  64  61   AST 0 - 40 IU/L 26  25  28    ALT 0 - 32 IU/L 23  19  19        Edman Circle, MD 05/23/2023, 1:46 PM  Cc: Ronnald Nian, MD

## 2023-05-24 ENCOUNTER — Ambulatory Visit: Payer: PPO | Admitting: Adult Health

## 2023-05-24 ENCOUNTER — Encounter: Payer: Self-pay | Admitting: Adult Health

## 2023-05-24 DIAGNOSIS — F331 Major depressive disorder, recurrent, moderate: Secondary | ICD-10-CM

## 2023-05-24 DIAGNOSIS — G4700 Insomnia, unspecified: Secondary | ICD-10-CM | POA: Diagnosis not present

## 2023-05-24 LAB — TISSUE TRANSGLUTAMINASE, IGA: (tTG) Ab, IgA: <1 U/mL

## 2023-05-24 LAB — IGA: Immunoglobulin A: 178 mg/dL (ref 70–320)

## 2023-05-24 NOTE — Progress Notes (Signed)
[name] [id number] 03-16-1951 72 y.o.  Subjective:   Patient ID:  [name] is a 72 y.o. (DOB August 06, 1951) female.  Chief Complaint: No chief complaint on file.   HPI [name] presents to the office today for follow-up of MDD and insomnia.   Describes mood today as "alright". Pleasant. Reports tearfulness at times. Mood symptoms - reports some depression - "medical issues". Denies anxiety and irritability. Denies panic attacks. Reports some worry, rumination, and over thinking related to health. Mood is consistent. Stating "I feel like I'm doing about the same". Feels like medications are helpful, but would like to consider other options. Taking medications as prescribed. Working with therapist regularly [place].  Energy levels lower - more fatigued. Active, working on a regular exercise routine - 2 days a week. Enjoys some usual interests and activities. Lives alone - 2 cats. Has 2 children son 26 [place] Kil, daughter 12 in [place] and 2 grandchildren.  Appetite adequate. Weight loss - 158 pounds. Sleeps well most nights. Averages 8 hours. Focus and concentration stable. Completing tasks. Managing aspects of household. 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  Denies SI or HI.  Denies AH or VH. Denies self harm. Denies substance use.  Reports alcohol use.  Previous medication trials: Zoloft, Lamictal - low sodium, Celexa, Wellbutrin, Lexapro, Effexor, Vraylar   PHQ2-9    Flowsheet Row Clinical Support from 10/03/2022 in [facility] Office Visit from 05/08/2022 in [facility] at [facility] Office Visit from 04/03/2022 in [facility] for [facility] Office Visit from 03/02/2022 in [facility] for [facility] [place] Office Visit from 04/21/2019 in [facility]  PHQ-2 Total Score 3 0 0 0 0  PHQ-9 Total Score 6 -- --  -- --      Flowsheet Row ED from 09/16/2022 in [facility] at [facility] Admission (Discharged) from 06/27/2022 in [place] [facility] Pre-Admission Testing 60 from 06/14/2022 in [facility]  C-SSRS RISK CATEGORY No Risk No Risk Error: Question 6 not populated        Review of Systems:  Review of Systems  Musculoskeletal:  Negative for gait problem.  Neurological:  Negative for tremors.  Psychiatric/Behavioral:         Please refer to HPI    Medications: I have reviewed the patient's current medications.  Current Outpatient Medications  Medication Sig Dispense Refill   amLODipine (NORVASC) 5 MG tablet Take 1 tablet (5 mg total) by mouth daily. 90 tablet 3   buPROPion (WELLBUTRIN XL) 150 MG 24 hr tablet Take 1 tablet (150 mg total) by mouth daily. 90 tablet 0   buPROPion (WELLBUTRIN XL) 300 MG 24 hr tablet Take 1 tablet (300 mg total) by mouth daily. 90 tablet 3   dicyclomine (BENTYL) 10 MG capsule Take 1 capsule (10 mg total) by mouth in the morning and at bedtime. 60 capsule 4   esomeprazole (NEXIUM) 40 MG capsule Take 1 capsule (40 mg total) by mouth 2 (two) times daily before a meal. 60 capsule 11   eszopiclone (LUNESTA) 2 MG TABS tablet Take 1 tablet (2 mg total) by mouth at bedtime. 90 tablet 2   Evolocumab (REPATHA SURECLICK) 140 MG/ML SOAJ Inject 140 mg into the skin every 14 (fourteen) days. 6 mL 3   Ferrous Sulfate (SLOW FE PO) Take 1 tablet by mouth daily.  gabapentin (NEURONTIN) 600 MG tablet Take 1 tablet (600 mg total) by mouth every evening. 30 tablet 0   hyoscyamine (LEVSIN SL) 0.125 MG SL tablet Place 1 tablet (0.125 mg total) under the tongue every 6 (six) hours as needed for cramping (diarrhea). 120 tablet 2   metoprolol succinate (TOPROL-XL) 25 MG 24 hr tablet Take 1 tablet (25 mg total) by mouth daily. 30 tablet 5   naproxen (NAPROSYN) 500 MG tablet Take 1 tablet (500 mg total) by mouth every 12  (twelve) hours as needed. (Patient not taking: Reported on 05/23/2023) 15 tablet 5   ondansetron (ZOFRAN-ODT) 4 MG disintegrating tablet Dissolve 1-2 tablets under the tongue every 6 hours as needed 30 tablet 2   pantoprazole (PROTONIX) 40 MG tablet Take 1 tablet (40 mg total) by mouth daily. 90 tablet 3   prazosin (MINIPRESS) 2 MG capsule Take 1 capsule (2 mg total) by mouth at bedtime. 90 capsule 3   topiramate (TOPAMAX) 25 MG tablet Take 3 tablets (75 mg total) by mouth at bedtime. 270 tablet 3   Vitamin D, Ergocalciferol, (DRISDOL) 1.25 MG (50000 UNIT) CAPS capsule Take 1 capsule by mouth every 7 days. 12 capsule 3   No current facility-administered medications for this visit.    Medication Side Effects: None  Allergies:  Allergies  Allergen Reactions   Bee Venom Swelling    Yellow jackets   Statins Other (See Comments)    Muscle pains  CRESTOR, LIPITOR    Past Medical History:  Diagnosis Date   Anemia    Anxiety    Arthritis    Complication of anesthesia    past hx. laryngospasm following 2 past surgeries, not recent   Depression    Exertional dyspnea 08/04/2019   GERD (gastroesophageal reflux disease)    Hiatal hernia 10/17/2019   Hyperlipidemia    Insomnia    periodically uses med.   Perforated sigmoid colon (HCC) 04/28/2014   during colonoscopy, "rupture colon"became septic, has colostomy and open surgical wound   Pneumonia    Pre-diabetes    Prediabetes 08/09/2017   Primary hypertension 01/05/2023   Suicide attempt (HCC)    Vitamin D deficiency     Past Medical History, Surgical history, Social history, and Family history were reviewed and updated as appropriate.   Please see review of systems for further details on the patient's review from today.   Objective:   Physical Exam:  LMP 08/07/1998   Physical Exam Constitutional:      General: She is not in acute distress. Musculoskeletal:        General: No deformity.  Neurological:     Mental  Status: She is alert and oriented to person, place, and time.     Coordination: Coordination normal.  Psychiatric:        Attention and Perception: Attention and perception normal. She does not perceive auditory or visual hallucinations.        Mood and Affect: Affect is not labile, blunt, angry or inappropriate.        Speech: Speech normal.        Behavior: Behavior normal.        Thought Content: Thought content normal. Thought content is not paranoid or delusional. Thought content does not include homicidal or suicidal ideation. Thought content does not include homicidal or suicidal plan.        Cognition and Memory: Cognition and memory normal.        Judgment: Judgment normal.     Comments:  Insight intact     Lab Review:     Component Value Date/Time   NA 136 05/23/2023 1436   NA 135 01/05/2023 0940   K 3.7 05/23/2023 1436   CL 102 05/23/2023 1436   CO2 23 05/23/2023 1436   GLUCOSE 108 (H) 05/23/2023 1436   BUN 7 05/23/2023 1436   BUN 8 01/05/2023 0940   CREATININE 0.86 05/23/2023 1436   CREATININE 0.99 08/08/2017 1629   CALCIUM 9.7 05/23/2023 1436   PROT 7.4 05/23/2023 1436   PROT 6.7 01/05/2023 0940   ALBUMIN 4.5 05/23/2023 1436   ALBUMIN 4.7 01/05/2023 0940   AST 27 05/23/2023 1436   ALT 24 05/23/2023 1436   ALKPHOS 63 05/23/2023 1436   BILITOT 0.5 05/23/2023 1436   BILITOT 0.4 01/05/2023 0940   GFRNONAA 43 (L) 09/16/2022 1415   GFRNONAA 76 04/14/2014 1427   GFRAA 72 08/27/2019 1112   GFRAA 88 04/14/2014 1427       Component Value Date/Time   WBC 7.9 05/23/2023 1436   RBC 4.50 05/23/2023 1436   HGB 14.5 05/23/2023 1436   HGB 12.3 12/11/2022 1030   HGB 11.9 (L) 06/14/2015 1528   HCT 43.9 05/23/2023 1436   HCT 36.4 12/11/2022 1030   PLT 328.0 05/23/2023 1436   PLT 315 12/11/2022 1030   MCV 97.7 05/23/2023 1436   MCV 96 12/11/2022 1030   MCH 32.5 12/11/2022 1030   MCH 31.8 09/16/2022 1415   MCHC 33.0 05/23/2023 1436   RDW 13.4 05/23/2023 1436   RDW  12.7 12/11/2022 1030   LYMPHSABS 1.8 05/23/2023 1436   LYMPHSABS 1.5 12/11/2022 1030   MONOABS 0.5 05/23/2023 1436   EOSABS 0.1 05/23/2023 1436   EOSABS 0.2 12/11/2022 1030   BASOSABS 0.0 05/23/2023 1436   BASOSABS 0.0 12/11/2022 1030    No results found for: "POCLITH", "LITHIUM"   No results found for: "PHENYTOIN", "PHENOBARB", "VALPROATE", "CBMZ"   .res Assessment: Plan:    Treatment Plan/Recommendations:   Plan:  PDMP reviewed  Wellbutrin XL 450mg  daily - denies seizure history.  Topamax 25mg  - 3 at hs - plans to decrease to 2 daily  Will continue: Gabapentin 600mg  at hs Minipress 2mg  at hs Lunesta 2mg  at hs  Consider Vraylar  Consider Spravato  RTC 4 weeks  Patient advised to contact office with any questions, adverse effects, or acute worsening in signs and symptoms.  There are no diagnoses linked to this encounter.   Please see After Visit Summary for patient specific instructions.  Future Appointments  Date Time Provider Department Center  08/03/2023  2:10 PM Drema Dallas, DO [facility] None  08/15/2023  3:40 PM Lynann Bologna, MD [facility]  10/09/2023  3:00 PM [facility]-ANNUAL WELLNESS VISIT [facility]    No orders of the defined types were placed in this encounter.   -------------------------------

## 2023-05-28 ENCOUNTER — Other Ambulatory Visit (HOSPITAL_BASED_OUTPATIENT_CLINIC_OR_DEPARTMENT_OTHER): Payer: Self-pay

## 2023-05-28 ENCOUNTER — Ambulatory Visit (HOSPITAL_BASED_OUTPATIENT_CLINIC_OR_DEPARTMENT_OTHER)
Admission: RE | Admit: 2023-05-28 | Discharge: 2023-05-28 | Disposition: A | Discharge status: Home / Self Care | Payer: PPO | Source: Ambulatory Visit | Attending: Gastroenterology | Admitting: Gastroenterology

## 2023-05-28 DIAGNOSIS — R197 Diarrhea, unspecified: Secondary | ICD-10-CM | POA: Insufficient documentation

## 2023-05-28 DIAGNOSIS — R109 Unspecified abdominal pain: Secondary | ICD-10-CM | POA: Diagnosis not present

## 2023-05-28 DIAGNOSIS — K449 Diaphragmatic hernia without obstruction or gangrene: Secondary | ICD-10-CM | POA: Diagnosis not present

## 2023-05-28 DIAGNOSIS — R1084 Generalized abdominal pain: Secondary | ICD-10-CM | POA: Diagnosis not present

## 2023-05-28 DIAGNOSIS — C189 Malignant neoplasm of colon, unspecified: Secondary | ICD-10-CM | POA: Diagnosis not present

## 2023-05-28 MED ORDER — IOHEXOL 300 MG/ML  SOLN
100.0000 mL | Freq: Once | INTRAMUSCULAR | Status: AC | PRN
  Administered 2023-05-28: 100 mL via INTRAVENOUS

## 2023-05-29 DIAGNOSIS — F331 Major depressive disorder, recurrent, moderate: Secondary | ICD-10-CM | POA: Diagnosis not present

## 2023-05-31 ENCOUNTER — Other Ambulatory Visit (HOSPITAL_BASED_OUTPATIENT_CLINIC_OR_DEPARTMENT_OTHER): Payer: Self-pay

## 2023-05-31 ENCOUNTER — Encounter (HOSPITAL_BASED_OUTPATIENT_CLINIC_OR_DEPARTMENT_OTHER): Payer: Self-pay

## 2023-05-31 ENCOUNTER — Telehealth: Payer: Self-pay | Admitting: Gastroenterology

## 2023-05-31 NOTE — Telephone Encounter (Signed)
Patient called to inquire about her radiology CT results. She would like to possibly get the results before the weekend.

## 2023-06-01 NOTE — Telephone Encounter (Signed)
Inbound call from patient returning phone call. Requesting a call back. Please advise, thank you.  

## 2023-06-01 NOTE — Telephone Encounter (Signed)
Left message for pt to call back  °

## 2023-06-01 NOTE — Telephone Encounter (Signed)
Patient returned your phone call and asked that you call her back.  Thank you.

## 2023-06-01 NOTE — Telephone Encounter (Signed)
Pt requesting the results of her recent imaging. Chart was reviewed. Pt was notified that the results were not yet final.  Pt verbalized understanding with all questions answered.

## 2023-06-03 ENCOUNTER — Other Ambulatory Visit: Payer: Self-pay | Admitting: Family Medicine

## 2023-06-04 ENCOUNTER — Other Ambulatory Visit (HOSPITAL_BASED_OUTPATIENT_CLINIC_OR_DEPARTMENT_OTHER): Payer: Self-pay

## 2023-06-04 ENCOUNTER — Other Ambulatory Visit: Payer: Self-pay

## 2023-06-04 MED ORDER — METOPROLOL SUCCINATE ER 25 MG PO TB24
25.0000 mg | ORAL_TABLET | Freq: Every day | ORAL | 4 refills | Status: DC
  Filled 2023-06-04 – 2023-06-17 (×2): qty 30, 30d supply, fill #0
  Filled 2023-07-20: qty 30, 30d supply, fill #1
  Filled 2023-08-24: qty 30, 30d supply, fill #2
  Filled 2023-09-22: qty 30, 30d supply, fill #3

## 2023-06-04 NOTE — Telephone Encounter (Signed)
Inbound call from patient requesting a follow up call regarding an update for results. States she is worried about results. Please advise, thank you.

## 2023-06-05 NOTE — Telephone Encounter (Signed)
Pt requesting the results of her recent imaging. Chart was reviewed. Pt was notified that the results were not yet final.  Pt verbalized understanding with all questions answered.

## 2023-06-05 NOTE — Telephone Encounter (Signed)
Left message for pt to call back  °

## 2023-06-06 ENCOUNTER — Other Ambulatory Visit (HOSPITAL_BASED_OUTPATIENT_CLINIC_OR_DEPARTMENT_OTHER): Payer: Self-pay

## 2023-06-06 DIAGNOSIS — F331 Major depressive disorder, recurrent, moderate: Secondary | ICD-10-CM | POA: Diagnosis not present

## 2023-06-07 ENCOUNTER — Other Ambulatory Visit (HOSPITAL_BASED_OUTPATIENT_CLINIC_OR_DEPARTMENT_OTHER): Payer: Self-pay

## 2023-06-07 ENCOUNTER — Other Ambulatory Visit: Payer: Self-pay

## 2023-06-07 ENCOUNTER — Other Ambulatory Visit: Payer: Self-pay | Admitting: Adult Health

## 2023-06-07 DIAGNOSIS — G4700 Insomnia, unspecified: Secondary | ICD-10-CM

## 2023-06-07 NOTE — Telephone Encounter (Signed)
Due 1/14

## 2023-06-11 ENCOUNTER — Telehealth (HOSPITAL_BASED_OUTPATIENT_CLINIC_OR_DEPARTMENT_OTHER): Payer: Self-pay | Admitting: Family

## 2023-06-11 ENCOUNTER — Encounter (HOSPITAL_BASED_OUTPATIENT_CLINIC_OR_DEPARTMENT_OTHER): Payer: Self-pay

## 2023-06-11 ENCOUNTER — Other Ambulatory Visit (HOSPITAL_COMMUNITY): Payer: Self-pay

## 2023-06-11 ENCOUNTER — Other Ambulatory Visit (HOSPITAL_BASED_OUTPATIENT_CLINIC_OR_DEPARTMENT_OTHER): Payer: Self-pay

## 2023-06-11 ENCOUNTER — Telehealth (HOSPITAL_BASED_OUTPATIENT_CLINIC_OR_DEPARTMENT_OTHER): Payer: Self-pay | Admitting: Pharmacy Technician

## 2023-06-11 MED ORDER — GABAPENTIN 600 MG PO TABS
600.0000 mg | ORAL_TABLET | Freq: Every evening | ORAL | 0 refills | Status: DC
  Filled 2023-06-11: qty 30, 30d supply, fill #0

## 2023-06-11 NOTE — Telephone Encounter (Signed)
Pharmacy Patient Advocate Encounter   Received notification from Pt Calls Messages that prior authorization for repatha is required/requested.   Insurance verification completed.   The patient is insured through [facility] ADVANTAGE/RX ADVANCE .   Per test claim: PA required; PA submitted to above mentioned insurance via CoverMyMeds Key/confirmation #/EOC [id number] Status is pending

## 2023-06-11 NOTE — Telephone Encounter (Signed)
Pharmacy Patient Advocate Encounter  Received notification from [facility] ADVANTAGE/RX ADVANCE that Prior Authorization for repatha has been APPROVED from 06/11/23 to 06/10/24   PA #/Case ID/Reference #: [id number]

## 2023-06-11 NOTE — Telephone Encounter (Signed)
Received note from [facility] outpatient pharmacy   "Hello! I have sent a PA for [name]'s Repatha. She said she didn't get her dose in yesterday and she's worried about what's going on. I wasn't sure if you might be able to expedite this for her so we can get it filled in short order "  Will route to PA team for assistance.   Alver Sorrow, NP

## 2023-06-14 DIAGNOSIS — F331 Major depressive disorder, recurrent, moderate: Secondary | ICD-10-CM | POA: Diagnosis not present

## 2023-06-17 ENCOUNTER — Other Ambulatory Visit (HOSPITAL_BASED_OUTPATIENT_CLINIC_OR_DEPARTMENT_OTHER): Payer: Self-pay

## 2023-06-18 ENCOUNTER — Ambulatory Visit: Payer: PPO | Admitting: Adult Health

## 2023-06-18 ENCOUNTER — Other Ambulatory Visit: Payer: Self-pay

## 2023-06-18 ENCOUNTER — Other Ambulatory Visit (HOSPITAL_BASED_OUTPATIENT_CLINIC_OR_DEPARTMENT_OTHER): Payer: Self-pay

## 2023-06-18 ENCOUNTER — Encounter: Payer: Self-pay | Admitting: Adult Health

## 2023-06-18 DIAGNOSIS — G4700 Insomnia, unspecified: Secondary | ICD-10-CM | POA: Diagnosis not present

## 2023-06-18 DIAGNOSIS — F331 Major depressive disorder, recurrent, moderate: Secondary | ICD-10-CM | POA: Diagnosis not present

## 2023-06-18 MED ORDER — DULOXETINE HCL 30 MG PO CPEP
30.0000 mg | ORAL_CAPSULE | Freq: Every day | ORAL | 2 refills | Status: DC
  Filled 2023-06-18: qty 30, 30d supply, fill #0
  Filled 2023-07-20: qty 30, 30d supply, fill #1

## 2023-06-18 NOTE — Progress Notes (Signed)
[name] [id number] September 27, 1950 72 y.o.  Subjective:   Patient ID:  [name] is a 72 y.o. (DOB 10-14-1950) female.  Chief Complaint: No chief complaint on file.   HPI [name] presents to the office today for follow-up of MDD and insomnia.   Describes mood today as "ok". Pleasant. Reports tearfulness "at times". Mood symptoms - reports some depression. Denies anxiety and irritability. Denies panic attacks. Reports some worry, rumination, and over thinking - related to daughter. Mood is consistent. Stating "I feel like I'm doing ok". Feels like medications are helpful, but would like to consider other options. Taking medications as prescribed. Working with therapist regularly [place].  Energy levels lower - "not great. Active, working on a regular exercise routine - plans to return to the gym. Enjoys some usual interests and activities. Lives alone - 2 cats. Has 2 children son 33 [name], daughter 11 in [place] and 2 grandchildren.  Appetite adequate. Weight loss - 156 pounds. Sleeps well most nights. Averages 8 hours. Focus and concentration stable. Completing tasks. Managing aspects of household. Previously worked as a Engineer, civil (consulting). Working part time.  Denies SI or HI.  Denies AH or VH. Denies self harm. Denies substance use.  Reports alcohol use.  Previous medication trials: Zoloft, Lamictal - low sodium, Celexa, Wellbutrin, Lexapro, Effexor, Vraylar    PHQ2-9    Flowsheet Row Clinical Support from 10/03/2022 in [facility] Visit from 05/08/2022 in [facility] for [facility] at [facility] Office Visit from 04/03/2022 in [facility] for [facility] at [facility] Office Visit from 03/02/2022 in [facility] for [facility] [place] Office Visit from 04/21/2019 in [facility]  PHQ-2 Total Score 3 0 0 0 0  PHQ-9 Total Score 6 -- -- -- --      Flowsheet Row  ED from 09/16/2022 in [facility] at [facility] Admission (Discharged) from 06/27/2022 in [place] [facility] Pre-Admission Testing 60 from 06/14/2022 in [facility]  C-SSRS RISK CATEGORY No Risk No Risk Error: Question 6 not populated        Review of Systems:  Review of Systems  Musculoskeletal:  Negative for gait problem.  Neurological:  Negative for tremors.  Psychiatric/Behavioral:         Please refer to HPI    Medications: I have reviewed the patient's current medications.  Current Outpatient Medications  Medication Sig Dispense Refill   amLODipine (NORVASC) 5 MG tablet Take 1 tablet (5 mg total) by mouth daily. 90 tablet 3   buPROPion (WELLBUTRIN XL) 150 MG 24 hr tablet Take 1 tablet (150 mg total) by mouth daily. 90 tablet 0   buPROPion (WELLBUTRIN XL) 300 MG 24 hr tablet Take 1 tablet (300 mg total) by mouth daily. 90 tablet 3   dicyclomine (BENTYL) 10 MG capsule Take 1 capsule (10 mg total) by mouth in the morning and at bedtime. 60 capsule 4   esomeprazole (NEXIUM) 40 MG capsule Take 1 capsule (40 mg total) by mouth 2 (two) times daily before a meal. 60 capsule 11   eszopiclone (LUNESTA) 2 MG TABS tablet Take 1 tablet (2 mg total) by mouth at bedtime. 90 tablet 2   Evolocumab (REPATHA SURECLICK) 140 MG/ML SOAJ Inject 140 mg into the skin every 14 (fourteen) days. 6 mL 3   Ferrous Sulfate (SLOW FE PO) Take 1 tablet by mouth daily.     gabapentin (NEURONTIN)  600 MG tablet Take 1 tablet (600 mg total) by mouth every evening. 30 tablet 0   hyoscyamine (LEVSIN SL) 0.125 MG SL tablet Place 1 tablet (0.125 mg total) under the tongue every 6 (six) hours as needed for cramping (diarrhea). 120 tablet 2   metoprolol succinate (TOPROL-XL) 25 MG 24 hr tablet Take 1 tablet (25 mg total) by mouth daily. 30 tablet 4   naproxen (NAPROSYN) 500 MG tablet Take 1 tablet (500 mg total) by mouth every 12 (twelve) hours as needed. (Patient  not taking: Reported on 05/23/2023) 15 tablet 5   ondansetron (ZOFRAN-ODT) 4 MG disintegrating tablet Dissolve 1-2 tablets under the tongue every 6 hours as needed 30 tablet 2   pantoprazole (PROTONIX) 40 MG tablet Take 1 tablet (40 mg total) by mouth daily. 90 tablet 3   prazosin (MINIPRESS) 2 MG capsule Take 1 capsule (2 mg total) by mouth at bedtime. 90 capsule 3   topiramate (TOPAMAX) 25 MG tablet Take 3 tablets (75 mg total) by mouth at bedtime. 270 tablet 3   Vitamin D, Ergocalciferol, (DRISDOL) 1.25 MG (50000 UNIT) CAPS capsule Take 1 capsule by mouth every 7 days. 12 capsule 3   No current facility-administered medications for this visit.    Medication Side Effects: None  Allergies:  Allergies  Allergen Reactions   Bee Venom Swelling    Yellow jackets   Statins Other (See Comments)    Muscle pains  CRESTOR, LIPITOR    Past Medical History:  Diagnosis Date   Anemia    Anxiety    Arthritis    Complication of anesthesia    past hx. laryngospasm following 2 past surgeries, not recent   Depression    Exertional dyspnea 08/04/2019   GERD (gastroesophageal reflux disease)    Hiatal hernia 10/17/2019   Hyperlipidemia    Insomnia    periodically uses med.   Perforated sigmoid colon (HCC) 04/28/2014   during colonoscopy, "rupture colon"became septic, has colostomy and open surgical wound   Pneumonia    Pre-diabetes    Prediabetes 08/09/2017   Primary hypertension 01/05/2023   Suicide attempt (HCC)    Vitamin D deficiency     Past Medical History, Surgical history, Social history, and Family history were reviewed and updated as appropriate.   Please see review of systems for further details on the patient's review from today.   Objective:   Physical Exam:  LMP 08/07/1998   Physical Exam Constitutional:      General: She is not in acute distress. Musculoskeletal:        General: No deformity.  Neurological:     Mental Status: She is alert and oriented to  person, place, and time.     Coordination: Coordination normal.  Psychiatric:        Attention and Perception: Attention and perception normal. She does not perceive auditory or visual hallucinations.        Mood and Affect: Mood normal. Mood is not anxious or depressed. Affect is not labile, blunt, angry or inappropriate.        Speech: Speech normal.        Behavior: Behavior normal.        Thought Content: Thought content normal. Thought content is not paranoid or delusional. Thought content does not include homicidal or suicidal ideation. Thought content does not include homicidal or suicidal plan.        Cognition and Memory: Cognition and memory normal.        Judgment: Judgment  normal.     Comments: Insight intact     Lab Review:     Component Value Date/Time   NA 136 05/23/2023 1436   NA 135 01/05/2023 0940   K 3.7 05/23/2023 1436   CL 102 05/23/2023 1436   CO2 23 05/23/2023 1436   GLUCOSE 108 (H) 05/23/2023 1436   BUN 7 05/23/2023 1436   BUN 8 01/05/2023 0940   CREATININE 0.86 05/23/2023 1436   CREATININE 0.99 08/08/2017 1629   CALCIUM 9.7 05/23/2023 1436   PROT 7.4 05/23/2023 1436   PROT 6.7 01/05/2023 0940   ALBUMIN 4.5 05/23/2023 1436   ALBUMIN 4.7 01/05/2023 0940   AST 27 05/23/2023 1436   ALT 24 05/23/2023 1436   ALKPHOS 63 05/23/2023 1436   BILITOT 0.5 05/23/2023 1436   BILITOT 0.4 01/05/2023 0940   GFRNONAA 43 (L) 09/16/2022 1415   GFRNONAA 76 04/14/2014 1427   GFRAA 72 08/27/2019 1112   GFRAA 88 04/14/2014 1427       Component Value Date/Time   WBC 7.9 05/23/2023 1436   RBC 4.50 05/23/2023 1436   HGB 14.5 05/23/2023 1436   HGB 12.3 12/11/2022 1030   HGB 11.9 (L) 06/14/2015 1528   HCT 43.9 05/23/2023 1436   HCT 36.4 12/11/2022 1030   PLT 328.0 05/23/2023 1436   PLT 315 12/11/2022 1030   MCV 97.7 05/23/2023 1436   MCV 96 12/11/2022 1030   MCH 32.5 12/11/2022 1030   MCH 31.8 09/16/2022 1415   MCHC 33.0 05/23/2023 1436   RDW 13.4 05/23/2023 1436    RDW 12.7 12/11/2022 1030   LYMPHSABS 1.8 05/23/2023 1436   LYMPHSABS 1.5 12/11/2022 1030   MONOABS 0.5 05/23/2023 1436   EOSABS 0.1 05/23/2023 1436   EOSABS 0.2 12/11/2022 1030   BASOSABS 0.0 05/23/2023 1436   BASOSABS 0.0 12/11/2022 1030    No results found for: "POCLITH", "LITHIUM"   No results found for: "PHENYTOIN", "PHENOBARB", "VALPROATE", "CBMZ"   .res Assessment: Plan:    Treatment Plan/Recommendations:   Plan:  PDMP reviewed  Add Cymbalta 30mg  daily  Wellbutrin XL 450mg  daily - denies seizure history.  Topamax 25mg  - 2 at hs - plans to decrease to 2 daily  Will continue: Gabapentin 600mg  at hs Minipress 2mg  at hs Lunesta 2mg  at hs  Consider Spravato  RTC 4 weeks  Patient advised to contact office with any questions, adverse effects, or acute worsening in signs and symptoms. There are no diagnoses linked to this encounter.   Please see After Visit Summary for patient specific instructions.  Future Appointments  Date Time Provider Department Center  06/18/2023  3:00 PM Ramonica Grigg, Thereasa Solo, NP [facility] None  08/03/2023  2:10 PM Drema Dallas, DO [facility] None  08/15/2023  3:40 PM Lynann Bologna, MD [facility]  10/09/2023  3:00 PM PFM-ANNUAL WELLNESS VISIT [facility]  10/16/2023  2:00 PM Ronnald Nian, MD [facility]    No orders of the defined types were placed in this encounter.   -------------------------------

## 2023-06-19 DIAGNOSIS — F331 Major depressive disorder, recurrent, moderate: Secondary | ICD-10-CM | POA: Diagnosis not present

## 2023-06-23 ENCOUNTER — Ambulatory Visit (HOSPITAL_COMMUNITY)
Admission: EM | Admit: 2023-06-23 | Discharge: 2023-06-23 | Disposition: A | Discharge status: Home / Self Care | Payer: PPO | Attending: Internal Medicine | Admitting: Internal Medicine

## 2023-06-23 ENCOUNTER — Encounter (HOSPITAL_COMMUNITY): Payer: Self-pay | Admitting: Emergency Medicine

## 2023-06-23 DIAGNOSIS — N3000 Acute cystitis without hematuria: Secondary | ICD-10-CM | POA: Diagnosis not present

## 2023-06-23 LAB — POCT URINALYSIS DIP (MANUAL ENTRY)
Bilirubin, UA: NEGATIVE
Glucose, UA: NEGATIVE mg/dL
Nitrite, UA: POSITIVE — AB
Protein Ur, POC: NEGATIVE mg/dL
Spec Grav, UA: 1.02 (ref 1.010–1.025)
Urobilinogen, UA: 0.2 U/dL
pH, UA: 7 (ref 5.0–8.0)

## 2023-06-23 MED ORDER — PROCHLORPERAZINE MALEATE 10 MG PO TABS: 10.0000 mg | ORAL_TABLET | Freq: Three times a day (TID) | ORAL | 0 refills | Status: DC | PRN

## 2023-06-23 MED ORDER — NITROFURANTOIN MONOHYD MACRO 100 MG PO CAPS: 100.0000 mg | ORAL_CAPSULE | Freq: Two times a day (BID) | ORAL | 0 refills | Status: DC

## 2023-06-23 NOTE — ED Triage Notes (Signed)
Pt c/o digestive issues for three. She had diarrhea initially and now she is constipated. She has seen PCP and received medication for diarrhea.   About 3 days ago she began to become diaphoretic and have severe abdominal pain. She had a small bowel movement a few days ago.  She tried zofran at home and it has not helped.

## 2023-06-23 NOTE — Discharge Instructions (Addendum)
Urinalysis shows UTI. Will treat with the following: Macrobid 100mg  twice daily for 5 days Compazine 10mg  every 8 hours as needed for nausea as requested. Stay hydrated, drink plenty of water or other fluids but avoid caffeine Return to urgent care or PCP if symptoms worsen or fail to resolve.

## 2023-06-23 NOTE — ED Provider Notes (Signed)
[facility]    CSN: [id number] Arrival date & time: 06/23/23  1003      History   Chief Complaint No chief complaint on file.   HPI [name] is a 72 y.o. female.   73-year-old female who presents to urgent care with complaints of suprapubic abdominal Pain lower abdomen.  This started the day before yesterday in the evening.  All day long yesterday she had the generalized abdominal pain that was more severe in the suprapubic region with sweats two nights ago, still persist today but somewhat less. Nausea and vomiting as well. Feels feverish but didn't have a thermometer. Headache just started yesterday. Mild frequency but not much output due to less PO intake. Denies congestion/ear pain/dysuria/hematuria. No sick contacts.  Last time she felt similar to this she had pyelonephritis.     Past Medical History:  Diagnosis Date   Anemia    Anxiety    Arthritis    Complication of anesthesia    past hx. laryngospasm following 2 past surgeries, not recent   Depression    Exertional dyspnea 08/04/2019   GERD (gastroesophageal reflux disease)    Hiatal hernia 10/17/2019   Hyperlipidemia    Insomnia    periodically uses med.   Perforated sigmoid colon (HCC) 04/28/2014   during colonoscopy, "rupture colon"became septic, has colostomy and open surgical wound   Pneumonia    Pre-diabetes    Prediabetes 08/09/2017   Primary hypertension 01/05/2023   Suicide attempt (HCC)    Vitamin D deficiency     Patient Active Problem List   Diagnosis Date Noted   Primary hypertension 01/05/2023   Weakness of both arms 12/24/2022   Weight gain, abnormal 12/24/2022   Decreased energy 12/24/2022   Abdominal bloating 12/24/2022   Unexplained night sweats 12/24/2022   Hyponatremia 12/24/2022   Tachycardia 12/24/2022   Acute cough 12/24/2022   Decreased appetite 12/24/2022   Nonintractable headache 12/24/2022   Enlarged thyroid 12/24/2022   Myalgia due to statin 09/26/2022    Aortic atherosclerosis (HCC) 09/26/2022   S/P total knee arthroplasty, 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   Breast hematoma 04/04/2022   Vitamin D deficiency 12/02/2020   Postmenopausal 12/02/2020   H/O abdominal hysterectomy 12/02/2020   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   Chronic insomnia 12/02/2020   Hiatal hernia 10/17/2019   Exertional dyspnea 08/04/2019   Iron deficiency anemia 08/08/2017   S/P colectomy 04/28/2014   Hyperlipidemia LDL goal <70 07/21/2010   Depression, controlled 07/21/2010   GERD 07/21/2010   History of respiratory system disease 07/21/2010    Past Surgical History:  Procedure Laterality Date   ABDOMINAL HYSTERECTOMY  1999   TAH/BSO   APPLICATION OF WOUND VAC  04/28/2014   Procedure: APPLICATION OF WOUND VAC;  Surgeon: Emelia Loron, MD;  Location: [facility];  Service: General;;   BLEPHAROPLASTY  2010   BREAST SURGERY  2001   Breast reduction   COLOSTOMY N/A 04/28/2014   Procedure: COLOSTOMY;  Surgeon: Emelia Loron, MD;  Location: [facility] OR;  Service: General;  Laterality: N/A;   COLOSTOMY REVISION N/A 04/28/2014   Procedure: COLON RESECTION SIGMOID;  Surgeon: Emelia Loron, MD;  Location: [facility] OR;  Service: General;  Laterality: N/A;   COLOSTOMY TAKEDOWN N/A 09/02/2014   Procedure: LAPAROSCOPIC COLOSTOMY REVERSAL;  Surgeon: Romie Levee, MD;  Location: [facility]S;  Service: General;  Laterality: N/A;   COSMETIC SURGERY     DEBRIDEMENT TENNIS ELBOW     DILATION AND CURETTAGE OF UTERUS     x3,  hysteroscopy   ELBOW SURGERY  2007   EYE SURGERY     Blepharoplasty   LAPAROTOMY N/A 04/28/2014   Procedure: EXPLORATORY LAPAROTOMY,SIGMOID COLECTOMY;  Surgeon: Emelia Loron, MD;  Location: [facility] OR;  Service: General;  Laterality: N/A;   ORIF WRIST FRACTURE Left 02/19/2021   Procedure: Left wrist open reduction internal fixation and repair as indicated;  Surgeon: Bradly Bienenstock, MD;  Location: [facility] OR;  Service: Orthopedics;  Laterality: Left;    REDUCTION MAMMAPLASTY Bilateral     scar and adhesion repair  06/01/15   and liposuction   TOTAL KNEE ARTHROPLASTY Right 06/27/2022   Procedure: RIGHT TOTAL KNEE ARTHROPLASTY;  Surgeon: Marcene Corning, MD;  Location: [facility];  Service: Orthopedics;  Laterality: Right;   ULNAR TUNNEL RELEASE Left 2010    OB History     Gravida  7   Para  2   Term      Preterm      AB      Living  2      SAB      IAB      Ectopic      Multiple      Live Births               Home Medications    Prior to Admission medications   Medication Sig Start Date End Date Taking? Authorizing Provider  amLODipine (NORVASC) 5 MG tablet Take 1 tablet (5 mg total) by mouth daily. 09/11/22 09/06/23  Alver Sorrow, NP  buPROPion (WELLBUTRIN XL) 150 MG 24 hr tablet Take 1 tablet (150 mg total) by mouth daily. 04/22/23   Mozingo, Thereasa Solo, NP  buPROPion (WELLBUTRIN XL) 300 MG 24 hr tablet Take 1 tablet (300 mg total) by mouth daily. 01/26/23   Mozingo, Thereasa Solo, NP  dicyclomine (BENTYL) 10 MG capsule Take 1 capsule (10 mg total) by mouth in the morning and at bedtime. 05/23/23   Lynann Bologna, MD  DULoxetine (CYMBALTA) 30 MG capsule Take 1 capsule (30 mg total) by mouth daily. 06/18/23   Mozingo, Thereasa Solo, NP  esomeprazole (NEXIUM) 40 MG capsule Take 1 capsule (40 mg total) by mouth 2 (two) times daily before a meal. 02/07/23   Lemmon, Violet Baldy, PA  eszopiclone (LUNESTA) 2 MG TABS tablet Take 1 tablet (2 mg total) by mouth at bedtime. 04/26/23   Mozingo, Thereasa Solo, NP  Evolocumab (REPATHA SURECLICK) 140 MG/ML SOAJ Inject 140 mg into the skin every 14 (fourteen) days. 09/28/22   Alver Sorrow, NP  Ferrous Sulfate (SLOW FE PO) Take 1 tablet by mouth daily.    [provider]  gabapentin (NEURONTIN) 600 MG tablet Take 1 tablet (600 mg total) by mouth every evening. 06/11/23   Mozingo, Thereasa Solo, NP  hyoscyamine (LEVSIN SL) 0.125 MG SL tablet Place 1 tablet (0.125 mg total) under the tongue every  6 (six) hours as needed for cramping (diarrhea). 04/27/23   Unk Lightning, PA  metoprolol succinate (TOPROL-XL) 25 MG 24 hr tablet Take 1 tablet (25 mg total) by mouth daily. 06/04/23   Ronnald Nian, MD  naproxen (NAPROSYN) 500 MG tablet Take 1 tablet (500 mg total) by mouth every 12 (twelve) hours as needed. Patient not taking: Reported on 05/23/2023 01/22/23   Drema Dallas, DO  ondansetron (ZOFRAN-ODT) 4 MG disintegrating tablet Dissolve 1-2 tablets under the tongue every 6 hours as needed 02/07/23   Unk Lightning, PA  pantoprazole (PROTONIX) 40 MG tablet Take  1 tablet (40 mg total) by mouth daily. 05/23/23   Lynann Bologna, MD  prazosin (MINIPRESS) 2 MG capsule Take 1 capsule (2 mg total) by mouth at bedtime. 02/23/23   Mozingo, Thereasa Solo, NP  topiramate (TOPAMAX) 25 MG tablet Take 3 tablets (75 mg total) by mouth at bedtime. 02/23/23   Mozingo, Thereasa Solo, NP  Vitamin D, Ergocalciferol, (DRISDOL) 1.25 MG (50000 UNIT) CAPS capsule Take 1 capsule by mouth every 7 days. 04/19/23   Jerene Bears, MD    Family History Family History  Problem Relation Age of Onset   Hypertension Mother    Diabetes Mother    Dementia Father    Hyperlipidemia Father    Hypertension Father    Diabetes Father    Cancer Father        History of bladder cancer   Heart disease Father    Other Father        4 ft intestinal removed-had gangrene in gallbladder, then spread to intestines   Heart attack Father    Esophageal cancer Sister    Hyperlipidemia Brother    Heart disease Maternal Grandmother    Stroke Maternal Grandfather    Cancer Paternal Grandmother        uterine   Cancer Paternal Grandfather        pancreatic cancer   Parkinsonism Maternal Uncle    Colon cancer Neg Hx     Social History Social History   Tobacco Use   Smoking status: Former    Current packs/day: 0.00    Types: Cigarettes    Quit date: 09/01/2003    Years since quitting: 19.8   Smokeless tobacco:  Never   Tobacco comments:    occasional  Vaping Use   Vaping status: Every Day   Substances: Nicotine, Nicotine-salt  Substance Use Topics   Alcohol use: Yes    Alcohol/week: 14.0 standard drinks of alcohol    Types: 14 Cans of beer per week    Comment: occas.   Drug use: Not Currently    Types: Marijuana     Allergies   Bee venom and Statins   Review of Systems Review of Systems  Constitutional:  Negative for chills and fever.  HENT:  Negative for ear pain and sore throat.   Eyes:  Negative for pain and visual disturbance.  Respiratory:  Negative for cough and shortness of breath.   Cardiovascular:  Negative for chest pain and palpitations.  Gastrointestinal:  Positive for abdominal pain, nausea and vomiting.  Genitourinary:  Positive for pelvic pain. Negative for dysuria and hematuria.  Musculoskeletal:  Negative for arthralgias and back pain.  Skin:  Negative for color change and rash.  Neurological:  Negative for seizures and syncope.  All other systems reviewed and are negative.    Physical Exam Triage Vital Signs ED Triage Vitals [06/23/23 1011]  Encounter Vitals Group     BP 131/81     Systolic BP Percentile      Diastolic BP Percentile      Pulse Rate 90     Resp 16     Temp 97.8 F (36.6 C)     Temp Source Oral     SpO2 96 %     Weight      Height      Head Circumference      Peak Flow      Pain Score      Pain Loc      Pain Education  Exclude from Growth Chart    No data found.  Updated Vital Signs BP 131/81 (BP Location: Right Arm)   Pulse 90   Temp 97.8 F (36.6 C) (Oral)   Resp 16   LMP 08/07/1998   SpO2 96%   Visual Acuity Right Eye Distance:   Left Eye Distance:   Bilateral Distance:    Right Eye Near:   Left Eye Near:    Bilateral Near:     Physical Exam Vitals and nursing note reviewed.  Constitutional:      General: She is not in acute distress.    Appearance: She is well-developed.  HENT:     Head:  Normocephalic and atraumatic.     Nose: No congestion.  Eyes:     Conjunctiva/sclera: Conjunctivae normal.  Cardiovascular:     Rate and Rhythm: Normal rate and regular rhythm.     Heart sounds: No murmur heard. Pulmonary:     Effort: Pulmonary effort is normal. No respiratory distress.     Breath sounds: Normal breath sounds.  Abdominal:     General: Bowel sounds are normal.     Palpations: Abdomen is soft.     Tenderness: There is abdominal tenderness (generalized and mild).  Musculoskeletal:        General: No swelling.     Cervical back: Neck supple.  Skin:    General: Skin is warm and dry.     Capillary Refill: Capillary refill takes less than 2 seconds.  Neurological:     Mental Status: She is alert and oriented to person, place, and time.  Psychiatric:        Mood and Affect: Mood normal.      UC Treatments / Results  Labs (all labs ordered are listed, but only abnormal results are displayed) Labs Reviewed - No data to display  EKG   Radiology No results found.  Procedures Procedures (including critical care time)  Medications Ordered in UC Medications - No data to display  Initial Impression / Assessment and Plan / UC Course  I have reviewed the triage vital signs and the nursing notes.  Pertinent labs & imaging results that were available during my care of the patient were reviewed by me and considered in my medical decision making (see chart for details).     Acute cystitis without hematuria  Urinalysis shows UTI. Will treat with the following: Macrobid 100mg  twice daily for 5 days Compazine 10mg  every 8 hours as needed for nausea as requested. Stay hydrated, drink plenty of water or other fluids but avoid caffeine Return to urgent care or PCP if symptoms worsen or fail to resolve.    Final Clinical Impressions(s) / UC Diagnoses   Final diagnoses:  None   Discharge Instructions   None    ED Prescriptions   None    PDMP not reviewed  this encounter.   Landis Martins, [place] 06/23/23 1040

## 2023-07-02 ENCOUNTER — Other Ambulatory Visit (HOSPITAL_COMMUNITY): Payer: Self-pay

## 2023-07-02 ENCOUNTER — Other Ambulatory Visit (HOSPITAL_BASED_OUTPATIENT_CLINIC_OR_DEPARTMENT_OTHER): Payer: Self-pay

## 2023-07-02 MED ORDER — AMOXICILLIN 500 MG PO CAPS
2000.0000 mg | ORAL_CAPSULE | Freq: Once | ORAL | 0 refills | Status: AC
  Filled 2023-07-02: qty 12, 3d supply, fill #0

## 2023-07-09 ENCOUNTER — Other Ambulatory Visit: Payer: Self-pay | Admitting: Adult Health

## 2023-07-09 DIAGNOSIS — G4700 Insomnia, unspecified: Secondary | ICD-10-CM

## 2023-07-10 ENCOUNTER — Other Ambulatory Visit (HOSPITAL_BASED_OUTPATIENT_CLINIC_OR_DEPARTMENT_OTHER): Payer: Self-pay

## 2023-07-10 MED ORDER — GABAPENTIN 600 MG PO TABS
600.0000 mg | ORAL_TABLET | Freq: Every evening | ORAL | 2 refills | Status: DC
  Filled 2023-07-10: qty 30, 30d supply, fill #0
  Filled 2023-08-12: qty 30, 30d supply, fill #1
  Filled 2023-09-17: qty 30, 30d supply, fill #2

## 2023-07-11 ENCOUNTER — Other Ambulatory Visit (HOSPITAL_BASED_OUTPATIENT_CLINIC_OR_DEPARTMENT_OTHER): Payer: Self-pay

## 2023-07-11 MED ORDER — COVID-19 MRNA VAC-TRIS(PFIZER) 30 MCG/0.3ML IM SUSY
0.3000 mL | PREFILLED_SYRINGE | Freq: Once | INTRAMUSCULAR | 0 refills | Status: AC
  Filled 2023-07-11: qty 0.3, 1d supply, fill #0

## 2023-07-11 MED ORDER — INFLUENZA VAC A&B SURF ANT ADJ 0.5 ML IM SUSY
0.5000 mL | PREFILLED_SYRINGE | Freq: Once | INTRAMUSCULAR | 0 refills | Status: AC
  Filled 2023-07-11: qty 0.5, 1d supply, fill #0

## 2023-07-15 ENCOUNTER — Other Ambulatory Visit (HOSPITAL_BASED_OUTPATIENT_CLINIC_OR_DEPARTMENT_OTHER): Payer: Self-pay

## 2023-07-16 ENCOUNTER — Encounter: Payer: Self-pay | Admitting: Adult Health

## 2023-07-16 ENCOUNTER — Other Ambulatory Visit (HOSPITAL_BASED_OUTPATIENT_CLINIC_OR_DEPARTMENT_OTHER): Payer: Self-pay

## 2023-07-16 ENCOUNTER — Ambulatory Visit: Payer: PPO | Admitting: Adult Health

## 2023-07-16 ENCOUNTER — Other Ambulatory Visit: Payer: Self-pay

## 2023-07-16 DIAGNOSIS — F331 Major depressive disorder, recurrent, moderate: Secondary | ICD-10-CM

## 2023-07-16 DIAGNOSIS — G4700 Insomnia, unspecified: Secondary | ICD-10-CM | POA: Diagnosis not present

## 2023-07-16 MED ORDER — BUPROPION HCL ER (XL) 300 MG PO TB24
300.0000 mg | ORAL_TABLET | Freq: Every day | ORAL | 3 refills | Status: DC
  Filled 2023-07-16 – 2023-08-24 (×2): qty 90, 90d supply, fill #0
  Filled 2023-11-22: qty 90, 90d supply, fill #1
  Filled 2024-02-20: qty 90, 90d supply, fill #2

## 2023-07-16 MED ORDER — BUPROPION HCL ER (XL) 150 MG PO TB24
150.0000 mg | ORAL_TABLET | Freq: Every day | ORAL | 0 refills | Status: DC
  Filled 2023-07-16: qty 90, 90d supply, fill #0

## 2023-07-16 NOTE — Progress Notes (Signed)
[name] [id number] 05/12/1951 72 y.o.  Subjective:   Patient ID:  [name] is a 72 y.o. (DOB November 01, 1950) female.  Chief Complaint: No chief complaint on file.   HPI [name] presents to the office today for follow-up of MDD and insomnia.   Describes mood today as "ok". Pleasant. Reports tearfulness "at times". Mood symptoms - reports some depression. Denies anxiety and irritability. Denies panic attacks. Reports some worry, rumination, and over thinking. Mood is consistent. Stating "I feel like I'm doing about the same". Has not been able to start Cymbalta with recent illness. Feels like medications are helpful, but would like to consider other options. Taking medications as prescribed. Working with therapist regularly [place].  Energy levels lower. Active, working on a regular exercise routine. Enjoys some usual interests and activities. Lives alone - 2 cats. Has 2 children son 80 [place], daughter 31 in [place] and 2 grandchildren.  Appetite adequate. Weight loss - 153 pounds. Sleeps well most nights. Averages 8 hours. Focus and concentration stable. Completing tasks. Managing aspects of household. Retired. Denies SI or HI.  Denies AH or VH. Denies self harm. Denies substance use.  Reports alcohol use.  Previous medication trials: Zoloft, Lamictal - low sodium, Celexa, Wellbutrin, Lexapro, Effexor, Vraylar  PHQ2-9    Flowsheet Row Clinical Support from 10/03/2022 in [facility] Office Visit from 05/08/2022 in [facility] for [facility] [employer] Office Visit from 04/03/2022 in [facility] for [facility] at [facility] Office Visit from 03/02/2022 in [facility] for [facility] [address] Office Visit from 04/21/2019 in [facility]  PHQ-2 Total Score 3 0 0 0 0  PHQ-9 Total Score 6 -- -- -- --      Flowsheet Row ED from 06/23/2023 in [facility] at [facility] ED from 09/16/2022 in [facility] at [facility] Admission (Discharged) from 06/27/2022 in [place] [facility]  C-SSRS RISK CATEGORY No Risk No Risk No Risk        Review of Systems:  Review of Systems  Musculoskeletal:  Negative for gait problem.  Neurological:  Negative for tremors.  Psychiatric/Behavioral:         Please refer to HPI    Medications: I have reviewed the patient's current medications.  Current Outpatient Medications  Medication Sig Dispense Refill   amLODipine (NORVASC) 5 MG tablet Take 1 tablet (5 mg total) by mouth daily. 90 tablet 3   buPROPion (WELLBUTRIN XL) 150 MG 24 hr tablet Take 1 tablet (150 mg total) by mouth daily. 90 tablet 0   buPROPion (WELLBUTRIN XL) 300 MG 24 hr tablet Take 1 tablet (300 mg total) by mouth daily. 90 tablet 3   dicyclomine (BENTYL) 10 MG capsule Take 1 capsule (10 mg total) by mouth in the morning and at bedtime. 60 capsule 4   DULoxetine (CYMBALTA) 30 MG capsule Take 1 capsule (30 mg total) by mouth daily. 30 capsule 2   esomeprazole (NEXIUM) 40 MG capsule Take 1 capsule (40 mg total) by mouth 2 (two) times daily before a meal. 60 capsule 11   eszopiclone (LUNESTA) 2 MG TABS tablet Take 1 tablet (2 mg total) by mouth at bedtime. 90 tablet 2   Evolocumab (REPATHA SURECLICK) 140 MG/ML SOAJ Inject 140 mg into the skin every 14 (fourteen) days. 6 mL 3   Ferrous Sulfate (SLOW FE PO) Take 1 tablet by mouth daily.  gabapentin (NEURONTIN) 600 MG tablet Take 1 tablet (600 mg total) by mouth every evening. 30 tablet 2   hyoscyamine (LEVSIN SL) 0.125 MG SL tablet Place 1 tablet (0.125 mg total) under the tongue every 6 (six) hours as needed for cramping (diarrhea). 120 tablet 2   metoprolol succinate (TOPROL-XL) 25 MG 24 hr tablet Take 1 tablet (25 mg total) by mouth daily. 30 tablet 4   naproxen (NAPROSYN) 500 MG tablet Take 1 tablet (500 mg total) by mouth every 12 (twelve) hours as needed. (Patient  not taking: Reported on 05/23/2023) 15 tablet 5   nitrofurantoin, macrocrystal-monohydrate, (MACROBID) 100 MG capsule Take 1 capsule (100 mg total) by mouth 2 (two) times daily. 10 capsule 0   ondansetron (ZOFRAN-ODT) 4 MG disintegrating tablet Dissolve 1-2 tablets under the tongue every 6 hours as needed 30 tablet 2   pantoprazole (PROTONIX) 40 MG tablet Take 1 tablet (40 mg total) by mouth daily. 90 tablet 3   prazosin (MINIPRESS) 2 MG capsule Take 1 capsule (2 mg total) by mouth at bedtime. 90 capsule 3   prochlorperazine (COMPAZINE) 10 MG tablet Take 1 tablet (10 mg total) by mouth every 8 (eight) hours as needed for nausea or vomiting. 15 tablet 0   topiramate (TOPAMAX) 25 MG tablet Take 3 tablets (75 mg total) by mouth at bedtime. 270 tablet 3   Vitamin D, Ergocalciferol, (DRISDOL) 1.25 MG (50000 UNIT) CAPS capsule Take 1 capsule by mouth every 7 days. 12 capsule 3   No current facility-administered medications for this visit.    Medication Side Effects: None  Allergies:  Allergies  Allergen Reactions   Bee Venom Swelling    Yellow jackets   Statins Other (See Comments)    Muscle pains  CRESTOR, LIPITOR    Past Medical History:  Diagnosis Date   Anemia    Anxiety    Arthritis    Complication of anesthesia    past hx. laryngospasm following 2 past surgeries, not recent   Depression    Exertional dyspnea 08/04/2019   GERD (gastroesophageal reflux disease)    Hiatal hernia 10/17/2019   Hyperlipidemia    Insomnia    periodically uses med.   Perforated sigmoid colon (HCC) 04/28/2014   during colonoscopy, "rupture colon"became septic, has colostomy and open surgical wound   Pneumonia    Pre-diabetes    Prediabetes 08/09/2017   Primary hypertension 01/05/2023   Suicide attempt (HCC)    Vitamin D deficiency     Past Medical History, Surgical history, Social history, and Family history were reviewed and updated as appropriate.   Please see review of systems for further  details on the patient's review from today.   Objective:   Physical Exam:  LMP 08/07/1998   Physical Exam Constitutional:      General: She is not in acute distress. Musculoskeletal:        General: No deformity.  Neurological:     Mental Status: She is alert and oriented to person, place, and time.     Coordination: Coordination normal.  Psychiatric:        Attention and Perception: Attention and perception normal. She does not perceive auditory or visual hallucinations.        Mood and Affect: Affect is not labile, blunt, angry or inappropriate.        Speech: Speech normal.        Behavior: Behavior normal.        Thought Content: Thought content normal. Thought content is  not paranoid or delusional. Thought content does not include homicidal or suicidal ideation. Thought content does not include homicidal or suicidal plan.        Cognition and Memory: Cognition and memory normal.        Judgment: Judgment normal.     Comments: Insight intact     Lab Review:     Component Value Date/Time   NA 136 05/23/2023 1436   NA 135 01/05/2023 0940   K 3.7 05/23/2023 1436   CL 102 05/23/2023 1436   CO2 23 05/23/2023 1436   GLUCOSE 108 (H) 05/23/2023 1436   BUN 7 05/23/2023 1436   BUN 8 01/05/2023 0940   CREATININE 0.86 05/23/2023 1436   CREATININE 0.99 08/08/2017 1629   CALCIUM 9.7 05/23/2023 1436   PROT 7.4 05/23/2023 1436   PROT 6.7 01/05/2023 0940   ALBUMIN 4.5 05/23/2023 1436   ALBUMIN 4.7 01/05/2023 0940   AST 27 05/23/2023 1436   ALT 24 05/23/2023 1436   ALKPHOS 63 05/23/2023 1436   BILITOT 0.5 05/23/2023 1436   BILITOT 0.4 01/05/2023 0940   GFRNONAA 43 (L) 09/16/2022 1415   GFRNONAA 76 04/14/2014 1427   GFRAA 72 08/27/2019 1112   GFRAA 88 04/14/2014 1427       Component Value Date/Time   WBC 7.9 05/23/2023 1436   RBC 4.50 05/23/2023 1436   HGB 14.5 05/23/2023 1436   HGB 12.3 12/11/2022 1030   HGB 11.9 (L) 06/14/2015 1528   HCT 43.9 05/23/2023 1436   HCT  36.4 12/11/2022 1030   PLT 328.0 05/23/2023 1436   PLT 315 12/11/2022 1030   MCV 97.7 05/23/2023 1436   MCV 96 12/11/2022 1030   MCH 32.5 12/11/2022 1030   MCH 31.8 09/16/2022 1415   MCHC 33.0 05/23/2023 1436   RDW 13.4 05/23/2023 1436   RDW 12.7 12/11/2022 1030   LYMPHSABS 1.8 05/23/2023 1436   LYMPHSABS 1.5 12/11/2022 1030   MONOABS 0.5 05/23/2023 1436   EOSABS 0.1 05/23/2023 1436   EOSABS 0.2 12/11/2022 1030   BASOSABS 0.0 05/23/2023 1436   BASOSABS 0.0 12/11/2022 1030    No results found for: "POCLITH", "LITHIUM"   No results found for: "PHENYTOIN", "PHENOBARB", "VALPROATE", "CBMZ"   .res Assessment: Plan:    Treatment Plan/Recommendations:   Plan:  PDMP reviewed  Add Cymbalta 30mg  daily - has not started yet with recent infection.  Wellbutrin XL 450mg  daily - denies seizure history.  Topamax 25mg  - 2 at hs - plans to decrease to 2 daily  Will continue: Gabapentin 600mg  at hs Minipress 2mg  at hs Lunesta 2mg  at hs  Consider Spravato  RTC 4 weeks  Patient advised to contact office with any questions, adverse effects, or acute worsening in signs and symptoms.  Diagnoses and all orders for this visit:  Insomnia, unspecified type  Major depressive disorder, recurrent episode, moderate (HCC) -     buPROPion (WELLBUTRIN XL) 150 MG 24 hr tablet; Take 1 tablet (150 mg total) by mouth daily. -     buPROPion (WELLBUTRIN XL) 300 MG 24 hr tablet; Take 1 tablet (300 mg total) by mouth daily.     Please see After Visit Summary for patient specific instructions.  Future Appointments  Date Time Provider Department Center  08/03/2023  2:10 PM Drema Dallas, DO [facility] None  08/15/2023  3:40 PM Lynann Bologna, MD [facility]  10/09/2023  3:00 PM PFM-ANNUAL WELLNESS VISIT [facility]  10/16/2023  2:00 PM Ronnald Nian, MD [facility]  No orders of the defined types were placed in this encounter.   -------------------------------

## 2023-07-17 ENCOUNTER — Other Ambulatory Visit (HOSPITAL_BASED_OUTPATIENT_CLINIC_OR_DEPARTMENT_OTHER): Payer: Self-pay

## 2023-07-18 ENCOUNTER — Ambulatory Visit: Payer: PPO | Admitting: Medical

## 2023-07-18 VITALS — BP 118/80 | HR 85 | Temp 97.9°F | Wt 155.0 lb

## 2023-07-18 DIAGNOSIS — R300 Dysuria: Secondary | ICD-10-CM

## 2023-07-18 DIAGNOSIS — R10819 Abdominal tenderness, unspecified site: Secondary | ICD-10-CM | POA: Diagnosis not present

## 2023-07-18 DIAGNOSIS — R112 Nausea with vomiting, unspecified: Secondary | ICD-10-CM | POA: Diagnosis not present

## 2023-07-18 DIAGNOSIS — F331 Major depressive disorder, recurrent, moderate: Secondary | ICD-10-CM | POA: Diagnosis not present

## 2023-07-18 LAB — POCT URINALYSIS DIP (PROADVANTAGE DEVICE)
Bilirubin, UA: NEGATIVE
Blood, UA: NEGATIVE
Glucose, UA: NEGATIVE mg/dL
Ketones, POC UA: NEGATIVE mg/dL
Leukocytes, UA: NEGATIVE
Nitrite, UA: NEGATIVE
Protein Ur, POC: NEGATIVE mg/dL
Specific Gravity, Urine: 1.01
Urobilinogen, Ur: NEGATIVE
pH, UA: 7 (ref 5.0–8.0)

## 2023-07-18 MED ORDER — SULFAMETHOXAZOLE-TRIMETHOPRIM 800-160 MG PO TABS: 1.0000 | ORAL_TABLET | Freq: Two times a day (BID) | ORAL | 0 refills | Status: DC

## 2023-07-18 NOTE — Progress Notes (Signed)
Subjective:  [name] is a 72 y.o. female who presents for Chief Complaint  Patient presents with   Urinary Tract Infection    Possible UTI- nausea and vomitting night sweats, chills, headaches, abdominal pain. Had these symptoms 3 weeks ago and was treated for a UTI.      Here for possible UTI.  Symptoms started in the last couple days.  She notes nausea, an episode of vomiting, chills, low back pain, abdominal discomfort, headaches.  In the past when she gets the symptoms she usually gets a urinary tract infection.  She has had pyelonephritis in the past.  She was just treated for UTI 3 weeks ago with Macrobid this seemed to clear things up.  She is worried she is getting another 1.    She has some odor in urine.  No blood in the urine.  She has used Zofran a couple times.  No current bowel issues although she has dealt with chronic diarrhea most of this year until 2 months ago when it finally went away.  She had a bunch of evaluation for that issue.  She notes lately she has had some body odor and she has had this in the past with UTI but hardly ever has perspiration odor otherwise.  No vaginal discharge.  Last sex 4 years ago  No other aggravating or relieving factors.    No other c/o.  Past Medical History:  Diagnosis Date   Anemia    Anxiety    Arthritis    Complication of anesthesia    past hx. laryngospasm following 2 past surgeries, not recent   Depression    Exertional dyspnea 08/04/2019   GERD (gastroesophageal reflux disease)    Hiatal hernia 10/17/2019   Hyperlipidemia    Insomnia    periodically uses med.   Perforated sigmoid colon (HCC) 04/28/2014   during colonoscopy, "rupture colon"became septic, has colostomy and open surgical wound   Pneumonia    Pre-diabetes    Prediabetes 08/09/2017   Primary hypertension 01/05/2023   Suicide attempt (HCC)    Vitamin D deficiency    Current Outpatient Medications on File Prior to Visit  Medication Sig  Dispense Refill   amLODipine (NORVASC) 5 MG tablet Take 1 tablet (5 mg total) by mouth daily. 90 tablet 3   buPROPion (WELLBUTRIN XL) 300 MG 24 hr tablet Take 1 tablet (300 mg total) by mouth daily. 90 tablet 3   dicyclomine (BENTYL) 10 MG capsule Take 1 capsule (10 mg total) by mouth in the morning and at bedtime. 60 capsule 4   eszopiclone (LUNESTA) 2 MG TABS tablet Take 1 tablet (2 mg total) by mouth at bedtime. 90 tablet 2   Evolocumab (REPATHA SURECLICK) 140 MG/ML SOAJ Inject 140 mg into the skin every 14 (fourteen) days. 6 mL 3   gabapentin (NEURONTIN) 600 MG tablet Take 1 tablet (600 mg total) by mouth every evening. 30 tablet 2   metoprolol succinate (TOPROL-XL) 25 MG 24 hr tablet Take 1 tablet (25 mg total) by mouth daily. 30 tablet 4   ondansetron (ZOFRAN-ODT) 4 MG disintegrating tablet Dissolve 1-2 tablets under the tongue every 6 hours as needed 30 tablet 2   pantoprazole (PROTONIX) 40 MG tablet Take 1 tablet (40 mg total) by mouth daily. 90 tablet 3   prazosin (MINIPRESS) 2 MG capsule Take 1 capsule (2 mg total) by mouth at bedtime. 90 capsule 3   prochlorperazine (COMPAZINE) 10 MG tablet Take 1 tablet (10 mg total) by  mouth every 8 (eight) hours as needed for nausea or vomiting. 15 tablet 0   topiramate (TOPAMAX) 25 MG tablet Take 3 tablets (75 mg total) by mouth at bedtime. 270 tablet 3   Vitamin D, Ergocalciferol, (DRISDOL) 1.25 MG (50000 UNIT) CAPS capsule Take 1 capsule by mouth every 7 days. 12 capsule 3   DULoxetine (CYMBALTA) 30 MG capsule Take 1 capsule (30 mg total) by mouth daily. (Patient not taking: Reported on 07/18/2023) 30 capsule 2   hyoscyamine (LEVSIN SL) 0.125 MG SL tablet Place 1 tablet (0.125 mg total) under the tongue every 6 (six) hours as needed for cramping (diarrhea). (Patient not taking: Reported on 07/18/2023) 120 tablet 2   No current facility-administered medications on file prior to visit.     The following portions of the patient's history were  reviewed and updated as appropriate: allergies, current medications, past family history, past medical history, past social history, past surgical history and problem list.  ROS Otherwise as in subjective above     Objective: BP 118/80   Pulse 85   Temp 97.9 F (36.6 C)   Wt 155 lb (70.3 kg)   LMP 08/07/1998   BMI 25.79 kg/m   General appearance: alert, no distress, well developed, well nourished Abdomen: + Increased bs, soft, mild central abdominal tenderness otherwise non tender, non distended, no masses, no hepatomegaly, no splenomegaly Back nontender Pulses: 2+ radial pulses, 2+ pedal pulses, normal cap refill Ext: no edema     Assessment: Encounter Diagnoses  Name Primary?   Nausea and vomiting, unspecified vomiting type Yes   Dysuria    Abdominal tenderness without rebound tenderness, unspecified location      Plan: We discussed symptoms, concerns, possible differential.  She feels like these are her typical symptoms when she gets urinary tract infection.  Begin Bactrim empirically.  Urine culture sent.  Hydrate well.  If worse symptoms in the next few days or new symptoms then call back.  We discussed that if the urine culture is negative we may need to have her come back in and do blood work and other testing   Jo "[name]" was seen today for urinary tract infection.  Diagnoses and all orders for this visit:  Nausea and vomiting, unspecified vomiting type -     POCT Urinalysis DIP (Proadvantage Device) -     Urine Culture  Dysuria -     Urine Culture  Abdominal tenderness without rebound tenderness, unspecified location  Other orders -     sulfamethoxazole-trimethoprim (BACTRIM DS) 800-160 MG tablet; Take 1 tablet by mouth 2 (two) times daily.   Follow up: pending culture

## 2023-07-19 ENCOUNTER — Other Ambulatory Visit (HOSPITAL_BASED_OUTPATIENT_CLINIC_OR_DEPARTMENT_OTHER): Payer: Self-pay

## 2023-07-19 ENCOUNTER — Encounter: Payer: Self-pay | Admitting: Internal Medicine

## 2023-07-20 ENCOUNTER — Other Ambulatory Visit: Payer: PPO

## 2023-07-20 ENCOUNTER — Other Ambulatory Visit: Payer: Self-pay

## 2023-07-20 ENCOUNTER — Other Ambulatory Visit (HOSPITAL_BASED_OUTPATIENT_CLINIC_OR_DEPARTMENT_OTHER): Payer: Self-pay

## 2023-07-20 DIAGNOSIS — R112 Nausea with vomiting, unspecified: Secondary | ICD-10-CM | POA: Diagnosis not present

## 2023-07-20 DIAGNOSIS — R300 Dysuria: Secondary | ICD-10-CM | POA: Diagnosis not present

## 2023-07-22 LAB — URINE CULTURE: Organism ID, Bacteria: NO GROWTH

## 2023-07-23 ENCOUNTER — Other Ambulatory Visit (HOSPITAL_BASED_OUTPATIENT_CLINIC_OR_DEPARTMENT_OTHER): Payer: Self-pay

## 2023-07-23 NOTE — Progress Notes (Signed)
Urine culture show no infection.  What are current symptoms?

## 2023-07-26 ENCOUNTER — Other Ambulatory Visit (HOSPITAL_BASED_OUTPATIENT_CLINIC_OR_DEPARTMENT_OTHER): Payer: Self-pay

## 2023-07-30 ENCOUNTER — Other Ambulatory Visit (HOSPITAL_BASED_OUTPATIENT_CLINIC_OR_DEPARTMENT_OTHER): Payer: Self-pay

## 2023-07-30 ENCOUNTER — Ambulatory Visit (INDEPENDENT_AMBULATORY_CARE_PROVIDER_SITE_OTHER): Payer: PPO | Admitting: Medical

## 2023-07-30 VITALS — BP 126/72 | HR 84 | Wt 157.6 lb

## 2023-07-30 DIAGNOSIS — R2689 Other abnormalities of gait and mobility: Secondary | ICD-10-CM

## 2023-07-30 DIAGNOSIS — W19XXXA Unspecified fall, initial encounter: Secondary | ICD-10-CM

## 2023-07-30 DIAGNOSIS — R109 Unspecified abdominal pain: Secondary | ICD-10-CM | POA: Diagnosis not present

## 2023-07-30 DIAGNOSIS — R110 Nausea: Secondary | ICD-10-CM | POA: Diagnosis not present

## 2023-07-30 MED ORDER — PANTOPRAZOLE SODIUM 40 MG PO TBEC
40.0000 mg | DELAYED_RELEASE_TABLET | Freq: Every day | ORAL | 1 refills | Status: DC
  Filled 2023-07-30: qty 90, 90d supply, fill #0

## 2023-07-30 NOTE — Progress Notes (Signed)
Subjective: Chief Complaint  Patient presents with   Follow-up    Follow-up on nausea and abdominal. Doing better than she was. Has had some energy to get out and ate normal   Here for f/u from recent visit for nausea, pain, suspected UTI but urine culture negative from 07/18/2023 visit.  In the last several days she actually feels pretty good.  No particular complaint other than intermittent nausea.  She has had problems with bowels ever since bowel perforation from colonoscopy in 2016 and had to deal with a colectomy for a period time.  She plans to never get another endoscopy or colonoscopy.  She had issues with diarrhea for months from May until just a few months ago.  She avoids a lot of acidic and spicy foods.  She does not do well with orange juice or any infusion.  She does get some constipation of late.  At times feels like it stuck up in her bowel.  She does use MiraLAX periodically and this helps.  Again this is all been since her prior colectomy in 2016  She has had some falls in the past year.  Sometimes feels little off balance.  Past Medical History:  Diagnosis Date   Anemia    Anxiety    Arthritis    Complication of anesthesia    past hx. laryngospasm following 2 past surgeries, not recent   Depression    Exertional dyspnea 08/04/2019   GERD (gastroesophageal reflux disease)    Hiatal hernia 10/17/2019   Hyperlipidemia    Insomnia    periodically uses med.   Perforated sigmoid colon (HCC) 04/28/2014   during colonoscopy, "rupture colon"became septic, has colostomy and open surgical wound   Pneumonia    Pre-diabetes    Prediabetes 08/09/2017   Primary hypertension 01/05/2023   Suicide attempt (HCC)    Vitamin D deficiency    Current Outpatient Medications on File Prior to Visit  Medication Sig Dispense Refill   amLODipine (NORVASC) 5 MG tablet Take 1 tablet (5 mg total) by mouth daily. 90 tablet 3   buPROPion (WELLBUTRIN XL) 300 MG 24 hr tablet Take 1  tablet (300 mg total) by mouth daily. 90 tablet 3   DULoxetine (CYMBALTA) 30 MG capsule Take 1 capsule (30 mg total) by mouth daily. 30 capsule 2   eszopiclone (LUNESTA) 2 MG TABS tablet Take 1 tablet (2 mg total) by mouth at bedtime. 90 tablet 2   Evolocumab (REPATHA SURECLICK) 140 MG/ML SOAJ Inject 140 mg into the skin every 14 (fourteen) days. 6 mL 3   gabapentin (NEURONTIN) 600 MG tablet Take 1 tablet (600 mg total) by mouth every evening. 30 tablet 2   hyoscyamine (LEVSIN SL) 0.125 MG SL tablet Place 1 tablet (0.125 mg total) under the tongue every 6 (six) hours as needed for cramping (diarrhea). 120 tablet 2   metoprolol succinate (TOPROL-XL) 25 MG 24 hr tablet Take 1 tablet (25 mg total) by mouth daily. 30 tablet 4   ondansetron (ZOFRAN-ODT) 4 MG disintegrating tablet Dissolve 1-2 tablets under the tongue every 6 hours as needed 30 tablet 2   prazosin (MINIPRESS) 2 MG capsule Take 1 capsule (2 mg total) by mouth at bedtime. 90 capsule 3   prochlorperazine (COMPAZINE) 10 MG tablet Take 1 tablet (10 mg total) by mouth every 8 (eight) hours as needed for nausea or vomiting. 15 tablet 0   topiramate (TOPAMAX) 25 MG tablet Take 3 tablets (75 mg total) by mouth at bedtime. 270 tablet  3   Vitamin D, Ergocalciferol, (DRISDOL) 1.25 MG (50000 UNIT) CAPS capsule Take 1 capsule by mouth every 7 days. 12 capsule 3   dicyclomine (BENTYL) 10 MG capsule Take 1 capsule (10 mg total) by mouth in the morning and at bedtime. (Patient not taking: Reported on 07/30/2023) 60 capsule 4   No current facility-administered medications on file prior to visit.   ROS as in subjective    Objective: BP 126/72   Pulse 84   Wt 157 lb 9.6 oz (71.5 kg)   LMP 08/07/1998   SpO2 99%   BMI 26.23 kg/m   Wt Readings from Last 3 Encounters:  07/30/23 157 lb 9.6 oz (71.5 kg)  07/18/23 155 lb (70.3 kg)  05/23/23 161 lb 6 oz (73.2 kg)   Gen: wd, wn, nad Abdomen increased bowel sounds, mild tenderness in general, no  mass, no organomegaly, multiple surgical scars noted Neuro: CN II through XII intact, a little off balance with Romberg heel-to-toe but otherwise Pulses within normal limits    Assessment: Encounter Diagnoses  Name Primary?   Chronic nausea Yes   Abdominal discomfort    Fall, initial encounter    Balance problem      Plan: Abdominal pain, chronic nausea-this has been worse since she has been out of her Protonix.  Refilled Protonix today.  Avoid spicy and acidic foods and foods that seem to trigger your symptoms.  She will monitor her weight at home.  If any ongoing weight loss without trying she will need to call back and come back in for additional evaluation.  Given her symptoms have improved since last visit we will hold off on any other labs today.  I reviewed her CT abdomen pelvis from 06/09/2023.  No obvious tumor or worrisome finding.  I reviewed her labs from May 23, 2023  History of mild balance issues in the past year-referral to physical therapy for fall prevention and balance therapy  Jo "[name]" was seen today for follow-up.  Diagnoses and all orders for this visit:  Chronic nausea  Abdominal discomfort  Fall, initial encounter -     Ambulatory referral to Physical Therapy  Balance problem -     Ambulatory referral to Physical Therapy  Other orders -     pantoprazole (PROTONIX) 40 MG tablet; Take 1 tablet (40 mg total) by mouth daily.    F/u pending PT

## 2023-07-31 NOTE — Progress Notes (Signed)
NEUROLOGY FOLLOW UP OFFICE NOTE  [name] [id number]  Assessment/Plan:   Migraine without aura, without status migrainosus, not intractable Clumsiness, memory deficits - concern it may be side effect to topiramate   Migraine prevention:   Decrease topiramate back down to 50mg  at bedtime  Migraine rescue:  naproxen 500mg   Limit use of pain relievers to no more than 2 days out of week to prevent risk of rebound or medication-overuse headache. Keep headache diary If headaches worsen, she will let me know Follow up 6 months.    Subjective:  [name] is a 72 year old left-handed female with HTN, HLD, aortic atherosclerosis, prediabetes, GERD, and depression who follows up for migraines.  UPDATE: Increased topiramate to 75mg  at bedtime Intensity:  moderate Duration:  couple of hours with naproxen Frequency:  3 to 4 in past 6 months.   For awhile she reports feeling clumsy.  May get off balance.  May spill something.  Sometimes may misplace items.  She had this prior to first seeing me but it has gotten worse.  Current NSAIDS/analgesics:  none Current triptans:  none Current ergotamine:  none Current anti-emetic:  Zofran-ODT 4mg  Current muscle relaxants:  none Current Antihypertensive medications:  metoprolol succinate 25mg  daily, amlodipine Current Antidepressant medications:  Wellbutrin XL 450mg  daily.  Prescribed Cymbalta but hasn't started yet. Current Anticonvulsant medications:  topiramate 75mg  at bedtime, gabapentin 600mg  QHS Current anti-CGRP:  none Current Vitamins/Herbal/Supplements:  ferrous sulfate Current Antihistamines/Decongestants:  none Other therapy:  none Birth control:  none Other medications:  Lunesta   Caffeine:  Unsweet iced tea daily.  Diet Pepsi 3 times a week; coffee once every 2-3 weeks. Diet:  Cut out wine.   Exercise:  gym variably  Depression:  yes; Anxiety:  no Other pain:  right knee pain Sleep hygiene:  Chronic  insomnia.  On Lunesta, gabapentin and prozosin which help.   HISTORY: She had a fall in August 2023 in which she hit her head and broke teeth.  She developed a daily persistent headache after that event.  It has since decreased to 3 days a week.  She started topiramate by psychiatry for her mood, but she maintains headache frequency improved prior to starting topiramate.  They last all day.  She describes a moderate-severe bifrontal/occipital pressure headache associated with nausea, photophobia, phonophobia but no visual disturbance or other aura.  She has tried multiple over the counter analgesics and diclofenac which were ineffective.  However, naproxen seems effective.     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  She does not recall any prior history of significant head trauma.    She has also been experiencing multiple somatic symptoms.  She endorses generalized fatigue, abdominal bloating/swelling, excessive sweating, tachycardia.  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  CBC was normal with normal iron panel, TSH 3.430, vit D 46.6, B12 415, Hgb A1c 5.7  Past NSAIDS/analgesics:  diclofenac 75mg , Motrin/ibuprofen, Aleve/naproxen Past abortive triptans:  none Past abortive ergotamine:  none Past muscle relaxants:  tizanidine Past anti-emetic:  Compazine Past antihypertensive medications:  prazosin Past antidepressant medications:  venlafaxine Past anticonvulsant medications:  lamotrigine Other past therapies:  none   Family history of headache:  no  PAST MEDICAL HISTORY: Past Medical History:  Diagnosis Date   Anemia  Anxiety    Arthritis    Complication of anesthesia    past hx. laryngospasm following 2 past surgeries, not recent   Depression    Exertional  dyspnea 08/04/2019   GERD (gastroesophageal reflux disease)    Hiatal hernia 10/17/2019   Hyperlipidemia    Insomnia    periodically uses med.   Perforated sigmoid colon (HCC) 04/28/2014   during colonoscopy, "rupture colon"became septic, has colostomy and open surgical wound   Pneumonia    Pre-diabetes    Prediabetes 08/09/2017   Primary hypertension 01/05/2023   Suicide attempt (HCC)    Vitamin D deficiency     MEDICATIONS: Current Outpatient Medications on File Prior to Visit  Medication Sig Dispense Refill   amLODipine (NORVASC) 5 MG tablet Take 1 tablet (5 mg total) by mouth daily. 90 tablet 3   buPROPion (WELLBUTRIN XL) 300 MG 24 hr tablet Take 1 tablet (300 mg total) by mouth daily. 90 tablet 3   dicyclomine (BENTYL) 10 MG capsule Take 1 capsule (10 mg total) by mouth in the morning and at bedtime. (Patient not taking: Reported on 07/30/2023) 60 capsule 4   DULoxetine (CYMBALTA) 30 MG capsule Take 1 capsule (30 mg total) by mouth daily. 30 capsule 2   eszopiclone (LUNESTA) 2 MG TABS tablet Take 1 tablet (2 mg total) by mouth at bedtime. 90 tablet 2   Evolocumab (REPATHA SURECLICK) 140 MG/ML SOAJ Inject 140 mg into the skin every 14 (fourteen) days. 6 mL 3   gabapentin (NEURONTIN) 600 MG tablet Take 1 tablet (600 mg total) by mouth every evening. 30 tablet 2   hyoscyamine (LEVSIN SL) 0.125 MG SL tablet Place 1 tablet (0.125 mg total) under the tongue every 6 (six) hours as needed for cramping (diarrhea). 120 tablet 2   metoprolol succinate (TOPROL-XL) 25 MG 24 hr tablet Take 1 tablet (25 mg total) by mouth daily. 30 tablet 4   ondansetron (ZOFRAN-ODT) 4 MG disintegrating tablet Dissolve 1-2 tablets under the tongue every 6 hours as needed 30 tablet 2   pantoprazole (PROTONIX) 40 MG tablet Take 1 tablet (40 mg total) by mouth daily. 90 tablet 1   prazosin (MINIPRESS) 2 MG capsule Take 1 capsule (2 mg total) by mouth at bedtime. 90 capsule 3   prochlorperazine (COMPAZINE) 10 MG  tablet Take 1 tablet (10 mg total) by mouth every 8 (eight) hours as needed for nausea or vomiting. 15 tablet 0   topiramate (TOPAMAX) 25 MG tablet Take 3 tablets (75 mg total) by mouth at bedtime. 270 tablet 3   Vitamin D, Ergocalciferol, (DRISDOL) 1.25 MG (50000 UNIT) CAPS capsule Take 1 capsule by mouth every 7 days. 12 capsule 3   No current facility-administered medications on file prior to visit.    ALLERGIES: Allergies  Allergen Reactions   Bee Venom Swelling    Yellow jackets   Statins Other (See Comments)    Muscle pains  CRESTOR, LIPITOR    FAMILY HISTORY: Family History  Problem Relation Age of Onset   Hypertension Mother    Diabetes Mother    Dementia Father    Hyperlipidemia Father    Hypertension Father    Diabetes Father    Cancer Father        History of bladder cancer   Heart disease Father    Other Father        4 ft intestinal removed-had gangrene in gallbladder, then spread to intestines   Heart attack Father    Esophageal cancer  Sister    Hyperlipidemia Brother    Heart disease Maternal Grandmother    Stroke Maternal Grandfather    Cancer Paternal Grandmother        uterine   Cancer Paternal Grandfather        pancreatic cancer   Parkinsonism Maternal Uncle    Colon cancer Neg Hx       Objective:  Blood pressure (!) 140/80, pulse 82, height 5\' 6"  (1.676 m), weight 155 lb (70.3 kg), last menstrual period 08/07/1998. General: No acute distress.  Patient appears well-groomed.   Head:  Normocephalic/atraumatic Neck:  Supple.  No paraspinal tenderness.  Full range of motion. Heart:  Regular rate and rhythm. Neuro:  Alert and oriented.  Speech fluent and not dysarthric.  Language intact.  CN II-XII intact.  Bulk and tone normal.  Muscle strength 5/5 throughout.  Sensation to pinprick and vibration intact.  Deep tendon reflexes 2+ throughout.  Gait overall steady but once briefly stumbled.  Able to turn.  Mild unsteady tandem walk.  Romberg  negative.    Jo Millet, DO  CC: Sharlot Gowda, MD

## 2023-08-03 ENCOUNTER — Encounter: Payer: Self-pay | Admitting: Neurology

## 2023-08-03 ENCOUNTER — Ambulatory Visit: Payer: PPO | Admitting: Neurology

## 2023-08-03 VITALS — BP 140/80 | HR 82 | Ht 66.0 in | Wt 155.0 lb

## 2023-08-03 DIAGNOSIS — G43009 Migraine without aura, not intractable, without status migrainosus: Secondary | ICD-10-CM | POA: Diagnosis not present

## 2023-08-03 MED ORDER — TOPIRAMATE 25 MG PO TABS: 50.0000 mg | ORAL_TABLET | Freq: Every day | ORAL | 5 refills | Status: DC

## 2023-08-03 NOTE — Patient Instructions (Signed)
Decrease topiramate back to 50mg  at bedtime to see if clumsiness and memory problems improve.  If headaches worsen, let me know

## 2023-08-12 ENCOUNTER — Other Ambulatory Visit: Payer: Self-pay

## 2023-08-12 ENCOUNTER — Other Ambulatory Visit: Payer: Self-pay | Admitting: Family

## 2023-08-12 DIAGNOSIS — E785 Hyperlipidemia, unspecified: Secondary | ICD-10-CM

## 2023-08-12 DIAGNOSIS — I700 Atherosclerosis of aorta: Secondary | ICD-10-CM

## 2023-08-13 ENCOUNTER — Other Ambulatory Visit (HOSPITAL_BASED_OUTPATIENT_CLINIC_OR_DEPARTMENT_OTHER): Payer: Self-pay

## 2023-08-13 MED ORDER — REPATHA SURECLICK 140 MG/ML ~~LOC~~ SOAJ
1.0000 mL | SUBCUTANEOUS | 3 refills | Status: DC
  Filled 2023-08-13: qty 6, 84d supply, fill #0
  Filled 2023-11-28: qty 6, 84d supply, fill #1
  Filled 2024-02-10: qty 6, 84d supply, fill #2
  Filled 2024-05-05: qty 6, 84d supply, fill #3

## 2023-08-15 ENCOUNTER — Encounter: Payer: Self-pay | Admitting: Gastroenterology

## 2023-08-15 ENCOUNTER — Ambulatory Visit: Payer: PPO | Admitting: Gastroenterology

## 2023-08-15 ENCOUNTER — Other Ambulatory Visit (HOSPITAL_BASED_OUTPATIENT_CLINIC_OR_DEPARTMENT_OTHER): Payer: Self-pay

## 2023-08-15 VITALS — BP 110/70 | HR 75 | Ht 66.0 in | Wt 151.0 lb

## 2023-08-15 DIAGNOSIS — R197 Diarrhea, unspecified: Secondary | ICD-10-CM

## 2023-08-15 DIAGNOSIS — R1010 Upper abdominal pain, unspecified: Secondary | ICD-10-CM

## 2023-08-15 MED ORDER — PANTOPRAZOLE SODIUM 40 MG PO TBEC
40.0000 mg | DELAYED_RELEASE_TABLET | Freq: Two times a day (BID) | ORAL | 3 refills | Status: DC
  Filled 2023-08-15 – 2023-09-24 (×11): qty 180, 90d supply, fill #0
  Filled 2023-09-24: qty 60, 30d supply, fill #0
  Filled 2023-10-20 – 2023-10-28 (×3): qty 60, 30d supply, fill #1
  Filled 2023-11-23: qty 60, 30d supply, fill #2
  Filled 2023-12-23: qty 60, 30d supply, fill #3
  Filled 2024-01-22: qty 60, 30d supply, fill #4
  Filled 2024-02-20: qty 60, 30d supply, fill #5
  Filled 2024-03-21: qty 60, 30d supply, fill #6
  Filled 2024-04-21: qty 60, 30d supply, fill #7
  Filled 2024-05-19: qty 60, 30d supply, fill #8
  Filled 2024-06-28: qty 60, 30d supply, fill #9
  Filled 2024-07-26: qty 60, 30d supply, fill #10

## 2023-08-15 NOTE — Progress Notes (Signed)
 Chief Complaint: GI problems  Referring Provider:  Joyce Norleen BROCKS, MD      ASSESSMENT AND PLAN;   #1. Diarrhea. Assoc lactose intol. Neg stool for GI pathogens. Prev H/O IBS-C. Refuses any further colons.  #2. Upper Abdo pain. CT AP 05/2023 with mod HH  Plan: -Continue protonix  40mg  po BID  #180, 4RF -Bentyl  10mg  po BID 1/2hr before meal and bed time #60, 4RF -Small but more frequent meals. -FU in 6 months.   HPI:    [name] is a 73 y.o. female  RN FU from appointment with Delon Failing 02/07/2023-please see her excellent note.  Previous patient of Dr. Eda  FU Upper abdo pain Diarrhea has resolved Now 1/week CT AP neg except for mod HH, s/p L hemi.  Doing well.  Wt Readings from Last 3 Encounters:  08/15/23 151 lb (68.5 kg)  08/03/23 155 lb (70.3 kg)  07/30/23 157 lb 9.6 oz (71.5 kg)     From previous notes Used to constipation x yrs (Dx with IBS-C) However, over the last 3 months, having diarrhea every day.  Initially she was having 8-10 watery BMs per day, now 1 to 2/day especially after eating Neg stool for GI pathogens Mostly after eating.  She could tolerate only bland food like boiled chicken.  She has done better with sublingual hyoscyamine . She denies having any significant nocturnal symptoms Only change in the last 3 months is that her Nexium  has been changed to generic and she has been started on Topamax  for headaches. She does not use any probiotics, magnesium  supplements, chewing gums.  No history of travel or recent antibiotics.  Has been having abdominal pain-mostly generalized associated with abdominal bloating which only partially gets relieved with BMs.  Has lost weight-12 pounds over the last 3 months  Absolutely does not want another colonoscopy since she had bad experience in the past as above.  Very anxious about symptoms     Wt Readings from Last 3 Encounters:  08/15/23 151 lb (68.5 kg)  08/03/23 155 lb (70.3 kg)   07/30/23 157 lb 9.6 oz (71.5 kg)     Past GI WU: CT Chest 06/2022 1. No evidence of interstitial lung disease. No lung masses or significant pulmonary nodules. 2. Moderate hiatal hernia. Mildly patulous and otherwise normal thoracic esophagus, suggesting chronic esophageal dysmotility. 3.  Aortic Atherosclerosis (ICD10-I70.0).  CT AP with contrast11/2024 No acute findings. Small to moderate hiatal hernia. Prior left hemicolectomy. No evidence of recurrent or metastatic carcinoma.  Review of pertinent gastrointestinal problems: 1. Routine risk for colon cancer: colonoscopy in [place]  in 2006 which was normal with no polyps. She had a surveillance colonoscopy by Dr. Luis in 2015 which was complicated by a colonic perforation. colonoscopy by Dr. Teressa on 08/18/2014 the anus the Hartman's pouch was evaluated and was completely normal-appearing. Via ostomy the remaining colon examined was normal. 2 polyps were found removed and sent to pathology (hyperplastic); recommended repeat screening exam 08/2024. 2. Colon perforated by Dr. Luis during colonoscopy 2015: Following the perforation she had a segmental resection, colostomy, and required a wound VAC while in the hospital. colostomy takedown Dr. Bernarda Ned 08/2015 3. Reflux esophagitis  EGD DR. Teressa on 08/18/2014. There was a typical-appearing reflux related short segment ulcerative esophagitis and a 3 cm hiatal hernia. BID PPI recommended.  Past Medical History:  Diagnosis Date   Anemia    Anxiety    Arthritis    Complication of anesthesia  past hx. laryngospasm following 2 past surgeries, not recent   Depression    Exertional dyspnea 08/04/2019   GERD (gastroesophageal reflux disease)    Hiatal hernia 10/17/2019   Hyperlipidemia    Insomnia    periodically uses med.   Perforated sigmoid colon (HCC) 04/28/2014   during colonoscopy, rupture colonbecame septic, has colostomy and open surgical wound   Pneumonia     Pre-diabetes    Prediabetes 08/09/2017   Primary hypertension 01/05/2023   Suicide attempt (HCC)    Vitamin D  deficiency     Past Surgical History:  Procedure Laterality Date   ABDOMINAL HYSTERECTOMY  1999   TAH/BSO   APPLICATION OF WOUND VAC  04/28/2014   Procedure: APPLICATION OF WOUND VAC;  Surgeon: Donnice Bury, MD;  Location: [facility] OR;  Service: General;;   BLEPHAROPLASTY  2010   BREAST SURGERY  2001   Breast reduction   COLOSTOMY N/A 04/28/2014   Procedure: COLOSTOMY;  Surgeon: Donnice Bury, MD;  Location: [facility] OR;  Service: General;  Laterality: N/A;   COLOSTOMY REVISION N/A 04/28/2014   Procedure: COLON RESECTION SIGMOID;  Surgeon: Donnice Bury, MD;  Location: [facility] OR;  Service: General;  Laterality: N/A;   COLOSTOMY TAKEDOWN N/A 09/02/2014   Procedure: LAPAROSCOPIC COLOSTOMY REVERSAL;  Surgeon: Bernarda Ned, MD;  Location: [facility] ORS;  Service: General;  Laterality: N/A;   COSMETIC SURGERY     DEBRIDEMENT TENNIS ELBOW     DILATION AND CURETTAGE OF UTERUS     x3, hysteroscopy   ELBOW SURGERY  2007   EYE SURGERY     Blepharoplasty   LAPAROTOMY N/A 04/28/2014   Procedure: EXPLORATORY LAPAROTOMY,SIGMOID COLECTOMY;  Surgeon: Donnice Bury, MD;  Location: [facility] OR;  Service: General;  Laterality: N/A;   ORIF WRIST FRACTURE Left 02/19/2021   Procedure: Left wrist open reduction internal fixation and repair as indicated;  Surgeon: Shari Easter, MD;  Location: [facility] OR;  Service: Orthopedics;  Laterality: Left;    REDUCTION MAMMAPLASTY Bilateral    scar and adhesion repair  06/01/15   and liposuction   TOTAL KNEE ARTHROPLASTY Right 06/27/2022   Procedure: RIGHT TOTAL KNEE ARTHROPLASTY;  Surgeon: Sheril Coy, MD;  Location: [facility] ORS;  Service: Orthopedics;  Laterality: Right;   ULNAR TUNNEL RELEASE Left 2010    Family History  Problem Relation Age of Onset   Hypertension Mother    Diabetes Mother    Dementia Father    Hyperlipidemia Father    Hypertension Father     Diabetes Father    Cancer Father        History of bladder cancer   Heart disease Father    Other Father        4 ft intestinal removed-had gangrene in gallbladder, then spread to intestines   Heart attack Father    Esophageal cancer Sister    Hyperlipidemia Brother    Heart disease Maternal Grandmother    Stroke Maternal Grandfather    Cancer Paternal Grandmother        uterine   Cancer Paternal Grandfather        pancreatic cancer   Parkinsonism Maternal Uncle    Colon cancer Neg Hx     Social History   Tobacco Use   Smoking status: Former    Current packs/day: 0.00    Types: Cigarettes    Quit date: 09/01/2003    Years since quitting: 19.9   Smokeless tobacco: Never   Tobacco comments:    occasional  Vaping Use  Vaping status: Every Day   Substances: Nicotine, Nicotine-salt  Substance Use Topics   Alcohol use: Yes    Alcohol/week: 14.0 standard drinks of alcohol    Types: 14 Cans of beer per week    Comment: occas.   Drug use: Not Currently    Types: Marijuana    Current Outpatient Medications  Medication Sig Dispense Refill   amLODipine  (NORVASC ) 5 MG tablet Take 1 tablet (5 mg total) by mouth daily. 90 tablet 3   buPROPion  (WELLBUTRIN  XL) 300 MG 24 hr tablet Take 1 tablet (300 mg total) by mouth daily. (Patient taking differently: Take 450 mg by mouth daily.) 90 tablet 3   eszopiclone  (LUNESTA ) 2 MG TABS tablet Take 1 tablet (2 mg total) by mouth at bedtime. 90 tablet 2   Evolocumab  (REPATHA  SURECLICK) 140 MG/ML SOAJ Inject 140 mg into the skin every 14 (fourteen) days. 6 mL 3   gabapentin  (NEURONTIN ) 600 MG tablet Take 1 tablet (600 mg total) by mouth every evening. 30 tablet 2   metoprolol  succinate (TOPROL -XL) 25 MG 24 hr tablet Take 1 tablet (25 mg total) by mouth daily. 30 tablet 4   ondansetron  (ZOFRAN -ODT) 4 MG disintegrating tablet Dissolve 1-2 tablets under the tongue every 6 hours as needed 30 tablet 2   pantoprazole  (PROTONIX ) 40 MG tablet Take 1  tablet (40 mg total) by mouth daily. 90 tablet 1   prazosin  (MINIPRESS ) 2 MG capsule Take 1 capsule (2 mg total) by mouth at bedtime. 90 capsule 3   prochlorperazine  (COMPAZINE ) 10 MG tablet Take 1 tablet (10 mg total) by mouth every 8 (eight) hours as needed for nausea or vomiting. 15 tablet 0   topiramate  (TOPAMAX ) 25 MG tablet Take 2 tablets (50 mg total) by mouth at bedtime. 60 tablet 5   Vitamin D , Ergocalciferol , (DRISDOL ) 1.25 MG (50000 UNIT) CAPS capsule Take 1 capsule by mouth every 7 days. 12 capsule 3   DULoxetine  (CYMBALTA ) 30 MG capsule Take 1 capsule (30 mg total) by mouth daily. (Patient not taking: Reported on 08/15/2023) 30 capsule 2   No current facility-administered medications for this visit.    Allergies  Allergen Reactions   Bee Venom Swelling    Yellow jackets   Statins Other (See Comments)    Muscle pains  CRESTOR , LIPITOR    Review of Systems:  neg     Physical Exam:    BP 110/70   Pulse 75   Ht 5' 6 (1.676 m)   Wt 151 lb (68.5 kg)   LMP 08/07/1998   BMI 24.37 kg/m  Wt Readings from Last 3 Encounters:  08/15/23 151 lb (68.5 kg)  08/03/23 155 lb (70.3 kg)  07/30/23 157 lb 9.6 oz (71.5 kg)   Constitutional:  Well-developed, in no acute distress. Psychiatric: Normal mood and affect. Behavior is normal. HEENT: Pupils normal.  Conjunctivae are normal. No scleral icterus. Cardiovascular: Normal rate, regular rhythm. No edema Pulmonary/chest: Effort normal and breath sounds normal. No wheezing, rales or rhonchi. Abdominal: Soft, nondistended. Nontender. Bowel sounds active throughout. There are no masses palpable. No hepatomegaly. Rectal: Deferred Neurological: Alert and oriented to person place and time. Skin: Skin is warm and dry. No rashes noted.  Data Reviewed: I have personally reviewed following labs and imaging studies  CBC:    Latest Ref Rng & Units 05/23/2023    2:36 PM 12/11/2022   10:30 AM 09/16/2022    2:15 PM  CBC  WBC 4.0 - 10.5  K/uL 7.9  5.6  8.0   Hemoglobin 12.0 - 15.0 g/dL 85.4  87.6  86.6   Hematocrit 36.0 - 46.0 % 43.9  36.4  37.8   Platelets 150.0 - 400.0 K/uL 328.0  315  364     CMP:    Latest Ref Rng & Units 05/23/2023    2:36 PM 01/05/2023    9:40 AM 12/11/2022   10:30 AM  CMP  Glucose 70 - 99 mg/dL 891  93  899   BUN 6 - 23 mg/dL 7  8  8    Creatinine 0.40 - 1.20 mg/dL 9.13  9.08  9.14   Sodium 135 - 145 mEq/L 136  135  134   Potassium 3.5 - 5.1 mEq/L 3.7  4.7  5.1   Chloride 96 - 112 mEq/L 102  97  95   CO2 19 - 32 mEq/L 23  23  23    Calcium  8.4 - 10.5 mg/dL 9.7  89.8  9.5   Total Protein 6.0 - 8.3 g/dL 7.4  6.7  6.7   Total Bilirubin 0.2 - 1.2 mg/dL 0.5  0.4  <9.7   Alkaline Phos 39 - 117 U/L 63  70  64   AST 0 - 37 U/L 27  26  25    ALT 0 - 35 U/L 24  23  19        Anselm Bring, MD 08/15/2023, 4:02 PM  Cc: Joyce Norleen BROCKS, MD

## 2023-08-15 NOTE — Patient Instructions (Addendum)
 _______________________________________________________  If your blood pressure at your visit was 140/90 or greater, please contact your primary care physician to follow up on this.  _______________________________________________________  If you are age 72 or older, your body mass index should be between 23-30. Your Body mass index is 24.37 kg/m. If this is out of the aforementioned range listed, please consider follow up with your Primary Care Provider.  If you are age 46 or younger, your body mass index should be between 19-25. Your Body mass index is 24.37 kg/m. If this is out of the aformentioned range listed, please consider follow up with your Primary Care Provider.   ________________________________________________________  The [place] [facility] providers would like to encourage you to use MYCHART to communicate with providers for non-urgent requests or questions.  Due to long hold times on the telephone, sending your provider a message by [facility] may be a faster and more efficient way to get a response.  Please allow 48 business hours for a response.  Please remember that this is for non-urgent requests.  _______________________________________________________  We have sent the following medications to your pharmacy for you to pick up at your convenience: Protonix   Please follow up in 6 months. Give us  a call at [phone] to schedule an appointment.  Thank you,  Dr. Lynnie Bring

## 2023-08-21 ENCOUNTER — Other Ambulatory Visit: Payer: Self-pay

## 2023-08-21 ENCOUNTER — Ambulatory Visit: Payer: PPO | Attending: Medical | Admitting: Physical Therapy

## 2023-08-21 ENCOUNTER — Encounter: Payer: Self-pay | Admitting: Physical Therapy

## 2023-08-21 DIAGNOSIS — R2689 Other abnormalities of gait and mobility: Secondary | ICD-10-CM | POA: Diagnosis not present

## 2023-08-21 DIAGNOSIS — M6281 Muscle weakness (generalized): Secondary | ICD-10-CM | POA: Insufficient documentation

## 2023-08-21 DIAGNOSIS — W19XXXA Unspecified fall, initial encounter: Secondary | ICD-10-CM | POA: Insufficient documentation

## 2023-08-21 NOTE — Patient Instructions (Signed)
 Access Code: [email or web address] URL: [email or web address] Date: 08/21/2023 Prepared by: Elaine Daring  Exercises - Standing Hip Abduction with Resistance at Ankles and Unilateral Counter Support  - 1 x daily - 3 sets - 10 reps - Marching with Resistance  - 1 x daily - 3 sets - 10 reps - Single Leg Stance  - 1 x daily - 3 sets - 30 seconds hold - Standing Toe Taps  - 1 x daily - 3 sets - 30 seconds hold

## 2023-08-21 NOTE — Therapy (Signed)
 OUTPATIENT PHYSICAL THERAPY LOWER EXTREMITY EVALUATION   Patient Name: [name] MRN: [id number] DOB:20-Sep-1950, 73 y.o., female Today's Date: 08/21/2023  END OF SESSION:  PT End of Session - 08/21/23 1247     Visit Number 1    Number of Visits 9    Date for PT Re-Evaluation 10/16/23    Authorization Type [employer]    PT Start Time 0115    PT Stop Time 0200    PT Time Calculation (min) 45 min    Activity Tolerance Patient tolerated treatment well    Behavior During Therapy [facility] for tasks assessed/performed             Past Medical History:  Diagnosis Date   Anemia    Anxiety    Arthritis    Complication of anesthesia    past hx. laryngospasm following 2 past surgeries, not recent   Depression    Exertional dyspnea 08/04/2019   GERD (gastroesophageal reflux disease)    Hiatal hernia 10/17/2019   Hyperlipidemia    Insomnia    periodically uses med.   Perforated sigmoid colon (HCC) 04/28/2014   during colonoscopy, rupture colonbecame septic, has colostomy and open surgical wound   Pneumonia    Pre-diabetes    Prediabetes 08/09/2017   Primary hypertension 01/05/2023   Suicide attempt (HCC)    Vitamin D  deficiency    Past Surgical History:  Procedure Laterality Date   ABDOMINAL HYSTERECTOMY  1999   TAH/BSO   APPLICATION OF WOUND VAC  04/28/2014   Procedure: APPLICATION OF WOUND VAC;  Surgeon: Donnice Bury, MD;  Location: [facility] OR;  Service: General;;   BLEPHAROPLASTY  2010   BREAST SURGERY  2001   Breast reduction   COLOSTOMY N/A 04/28/2014   Procedure: COLOSTOMY;  Surgeon: Donnice Bury, MD;  Location: [facility] OR;  Service: General;  Laterality: N/A;   COLOSTOMY REVISION N/A 04/28/2014   Procedure: COLON RESECTION SIGMOID;  Surgeon: Donnice Bury, MD;  Location: [facility] OR;  Service: General;  Laterality: N/A;   COLOSTOMY TAKEDOWN N/A 09/02/2014   Procedure: LAPAROSCOPIC COLOSTOMY REVERSAL;  Surgeon: Bernarda Ned, MD;  Location: [facility] OR[facility];   Service: General;  Laterality: N/A;   COSMETIC SURGERY     DEBRIDEMENT TENNIS ELBOW     DILATION AND CURETTAGE OF UTERUS     x3, hysteroscopy   ELBOW SURGERY  2007   EYE SURGERY     Blepharoplasty   LAPAROTOMY N/A 04/28/2014   Procedure: EXPLORATORY LAPAROTOMY,SIGMOID COLECTOMY;  Surgeon: Donnice Bury, MD;  Location: [facility] OR;  Service: General;  Laterality: N/A;   ORIF WRIST FRACTURE Left 02/19/2021   Procedure: Left wrist open reduction internal fixation and repair as indicated;  Surgeon: Shari Easter, MD;  Location: [facility] OR;  Service: Orthopedics;  Laterality: Left;    REDUCTION MAMMAPLASTY Bilateral    scar and adhesion repair  06/01/15   and liposuction   TOTAL KNEE ARTHROPLASTY Right 06/27/2022   Procedure: RIGHT TOTAL KNEE ARTHROPLASTY;  Surgeon: Sheril Coy, MD;  Location: [facility] ORS;  Service: Orthopedics;  Laterality: Right;   ULNAR TUNNEL RELEASE Left 2010   Patient Active Problem List   Diagnosis Date Noted   Primary hypertension 01/05/2023   Weakness of both arms 12/24/2022   Weight gain, abnormal 12/24/2022   Decreased energy 12/24/2022   Abdominal bloating 12/24/2022   Unexplained night sweats 12/24/2022   Hyponatremia 12/24/2022   Tachycardia 12/24/2022   Acute cough 12/24/2022   Decreased appetite 12/24/2022   Nonintractable headache 12/24/2022  Enlarged thyroid  12/24/2022   Myalgia due to statin 09/26/2022   Aortic atherosclerosis (HCC) 09/26/2022   S/P total knee arthroplasty, 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   Breast hematoma 04/04/2022   Vitamin D  deficiency 12/02/2020   Postmenopausal 12/02/2020   H/O abdominal hysterectomy 12/02/2020   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   Chronic insomnia 12/02/2020   Hiatal hernia 10/17/2019   Exertional dyspnea 08/04/2019   Iron deficiency anemia 08/08/2017   S/P colectomy 04/28/2014   Hyperlipidemia LDL goal <70 07/21/2010   Depression, controlled 07/21/2010   GERD 07/21/2010   History of respiratory system disease 07/21/2010     PCP:  Joyce Norleen BROCKS, MD Raford Riggs, MD  REFERRING PROVIDER: Bulah Alm RAMAN, PA-C  REFERRING DIAG:  [phone].CHERENE (ICD-10-CM) - Fall, initial encounter  R26.89 (ICD-10-CM) - Balance problem   THERAPY DIAG:  Muscle weakness (generalized)  Other abnormalities of gait and mobility  Rationale for Evaluation and Treatment: Rehabilitation  ONSET DATE: Chronic, November 2023  SUBJECTIVE:   SUBJECTIVE STATEMENT: Patient is coming in with c/c of balance issues she states have been occurring since her total knee replacement on R side. Patient complaints of dizziness noted could be due to GI problems and rapid weight loss. Pt reports her doctor is aware of the previous issues and is working with the pt to find a solution. Pt complains of losing balance in tasks like getting down on the floor, putting pants on while standing, standing on surfaces, etc. She states she notices balance getting worse and has to focused on balance to stay stable. Patient wants to be proactive in improving balance issues to be able to continue participation and function in daily life.   PERTINENT HISTORY: TKA in November of 2023 PAIN:  Are you having pain? No  PRECAUTIONS: Fall  RED FLAGS: None   WEIGHT BEARING RESTRICTIONS: No  FALLS:  Has patient fallen in last 6 months? Yes. Number of falls 2 Most falls due to dizziness   LIVING ENVIRONMENT: Lives with: lives alone Lives in: House/apartment Stairs: Yes, internal and external but does not have concern about stairs.  Has following equipment at home: None  OCCUPATION: N/A  PLOF: Independent  PATIENT GOALS: Wants to get back to ADLs, wants to get back to the gym.   OBJECTIVE:  Note: Objective measures were completed at Evaluation unless otherwise noted.  PATIENT SURVEYS:  FOTO: 58% functional status  COGNITION: Overall cognitive status: Within functional limits for tasks assessed     SENSATION: Not tested  POSTURE: rounded  shoulders and forward head   LOWER EXTREMITY ROM:  Active ROM Right eval Left eval  Hip flexion    Hip extension    Hip abduction    Hip adduction    Hip internal rotation    Hip external rotation    Knee flexion    Knee extension    Ankle dorsiflexion    Ankle plantarflexion    Ankle inversion    Ankle eversion     (Blank rows = not tested)  LOWER EXTREMITY MMT:  MMT Right eval Left eval  Hip flexion 3+ 3+  Hip extension    Hip abduction 4 4  Hip adduction    Hip internal rotation    Hip external rotation    Knee flexion 5 5  Knee extension 5 5  Ankle dorsiflexion 5 5  Ankle plantarflexion 5 5  Ankle inversion    Ankle eversion     (Blank rows = not tested)   BERG BALANCE TEST Sitting  to Standing: 4.      Stands without using hands and stabilize independently Standing Unsupported: 4.      Stands safely for 2 minutes Sitting Unsupported: 4.     Sits for 2 minutes independently Standing to Sitting: 4.     Sits safely with minimal use of hands Transfers: 4.     Transfers safely with minor use of hands Standing with eyes closed: 4.     Stands safely for 10 seconds  Standing with feet together: 4.     Stands for 1 minute safely Reaching forward with outstretched arm: 4.     Reaches forward 10 inches Retrieving object from the floor: 4.      Able to pick up easily and safely Turning to look behind: 4.     Looks behind from both sides and weight shifts well Turning 360 degrees: 3.     Able to turn on one side in </= 4 seconds - Loss of balance Place alternate foot on stool: 4.     Completes 8 steps in 20 seconds     Standing with one foot in front: 4.     Independent tandem for 30 seconds  Standing on one foot: 4.     Holds >10 seconds  Total Score: 55/56 GAIT: Distance walked: 185 ft Assistive device utilized: None Level of assistance: Complete Independence Comments: Pt walked for 2 minutes with minimal loss of balance. Limited ankle dorsiflexion, knee  flexion, and hip flexion specifically on the right LE limiting ability to ambulate without tripping. Right foot sits in slight eversion.                                                                                                               TREATMENT DATE:  08/21/23 Therapeutic Exercise:  Standing hip abduction RTB at ankles 2x10  Standing hip flexion RTB at feet 2x10 Standing SL balance with opposite arm movement 2x30 sec BL Standing toe taps on 6 in box 1x10 focusing on clearance of toe and heel   PATIENT EDUCATION:  Education details: Exam findings, POC, HEP.  Person educated: Patient Education method: Explanation, Demonstration, Verbal cues, and Handouts Education comprehension: verbalized understanding, returned demonstration, verbal cues required, and needs further education  HOME EXERCISE PROGRAM: Access Code: [email or web address]  ASSESSMENT:  CLINICAL IMPRESSION: Patient is a 73 y.o. female who was seen today for physical therapy evaluation and treatment for balance deficits. Strength deficits specially in the hip likely impacting stability. Gait disturbances potentially due to compensations after right TKA noted. Pt performs stable balance with limited difficulty but struggles when movement of UE or head is involved. Patient requires extreme focus to maintain stable balance. Today, FOTO was done indicating a 58% functional status. Hip strengthening was introduced as well as beginning balance training. Patient would benefit from skilled PT in order to improve balance to be able to participate in daily activities without concerns of balance loss.   OBJECTIVE IMPAIRMENTS: Abnormal gait, decreased balance, decreased strength, and dizziness.   ACTIVITY LIMITATIONS:  bending, standing, stairs, transfers, and locomotion level  PARTICIPATION LIMITATIONS: community activity  PERSONAL FACTORS: Age, Past/current experiences, and Time since onset of injury/illness/exacerbation are also affecting  patient's functional outcome.   REHAB POTENTIAL: Good  CLINICAL DECISION MAKING: Stable/uncomplicated  EVALUATION COMPLEXITY: Low   GOALS: Goals reviewed with patient? Yes  SHORT TERM GOALS: Target date: 09/18/2023 Patient will be I with initial HEP in order to progress with therapy. Baseline: Goal status: INITIAL  2.  Patient will increase hip flexion strength to a 4/5 to improve gait limitations. Baseline: 3+/5 Goal status: INITIAL   LONG TERM GOALS: Target date: 10/16/2023  Patient will be independent with final HEP in order to continue treatment at home for pain tolerance.  Baseline:  Goal status: INITIAL  2.  Patient will score at least 65% on FOTO to signify clinically meaningful improvement in functional abilities.  Baseline: 58% Goal status: INITIAL  3.  Patient will increase strength to a 5/5 in the LE to improve gait disturbances to allow for increased participation in daily activities.  Baseline: 3+/5 and 4/5 Goal status: INITIAL  4. Will assess DGI to assess dynamic gait disturbances and update goal based on results..  Baseline:   Goal Status: INITIAL  5. Patient will be able to transfer from the floor to a seat with no loss of balance to improve functional ability in the home.   Baseline:   Goal Status: INITIAL  PLAN:  PT FREQUENCY: 1x/week  PT DURATION: 8 weeks  PLANNED INTERVENTIONS: 97164- PT Re-evaluation, 97110-Therapeutic exercises, 97530- Therapeutic activity, 97112- Neuromuscular re-education, 97535- Self Care, [id number]- Manual therapy, [phone]- Gait training, Patient/Family education, Balance training, Stair training, Joint mobilization, Joint manipulation, Spinal manipulation, Spinal mobilization, Vestibular training, Cryotherapy, and Moist heat  PLAN FOR NEXT SESSION: Review HEP and revise if needed. Continue to work on lower extremity space. Next session do a dynamic gait index. Work on balance with head movement in particular. Discuss patient  dizziness in more detail next session.    Kent Hummer, Student-PT 08/21/2023, 2:48 PM

## 2023-08-22 DIAGNOSIS — H2513 Age-related nuclear cataract, bilateral: Secondary | ICD-10-CM | POA: Diagnosis not present

## 2023-08-22 DIAGNOSIS — H5203 Hypermetropia, bilateral: Secondary | ICD-10-CM | POA: Diagnosis not present

## 2023-08-24 ENCOUNTER — Ambulatory Visit: Payer: PPO | Admitting: Adult Health

## 2023-08-24 ENCOUNTER — Other Ambulatory Visit: Payer: Self-pay

## 2023-08-24 ENCOUNTER — Other Ambulatory Visit (HOSPITAL_BASED_OUTPATIENT_CLINIC_OR_DEPARTMENT_OTHER): Payer: Self-pay

## 2023-08-30 ENCOUNTER — Other Ambulatory Visit: Payer: Self-pay

## 2023-08-30 ENCOUNTER — Ambulatory Visit: Payer: PPO | Admitting: Physical Therapy

## 2023-08-30 ENCOUNTER — Other Ambulatory Visit (HOSPITAL_BASED_OUTPATIENT_CLINIC_OR_DEPARTMENT_OTHER): Payer: Self-pay | Admitting: Family

## 2023-08-30 ENCOUNTER — Encounter: Payer: Self-pay | Admitting: Physical Therapy

## 2023-08-30 ENCOUNTER — Other Ambulatory Visit: Payer: Self-pay | Admitting: Adult Health

## 2023-08-30 ENCOUNTER — Other Ambulatory Visit (HOSPITAL_COMMUNITY): Payer: Self-pay

## 2023-08-30 ENCOUNTER — Other Ambulatory Visit (HOSPITAL_BASED_OUTPATIENT_CLINIC_OR_DEPARTMENT_OTHER): Payer: Self-pay

## 2023-08-30 DIAGNOSIS — R2689 Other abnormalities of gait and mobility: Secondary | ICD-10-CM | POA: Diagnosis not present

## 2023-08-30 DIAGNOSIS — M6281 Muscle weakness (generalized): Secondary | ICD-10-CM

## 2023-08-30 DIAGNOSIS — F331 Major depressive disorder, recurrent, moderate: Secondary | ICD-10-CM

## 2023-08-30 MED ORDER — BUPROPION HCL ER (XL) 150 MG PO TB24
150.0000 mg | ORAL_TABLET | Freq: Every day | ORAL | 0 refills | Status: DC
  Filled 2023-08-30: qty 90, 90d supply, fill #0

## 2023-08-30 MED ORDER — AMLODIPINE BESYLATE 5 MG PO TABS
5.0000 mg | ORAL_TABLET | Freq: Every day | ORAL | 3 refills | Status: DC
  Filled 2023-08-30: qty 90, 90d supply, fill #0
  Filled 2023-12-04: qty 90, 90d supply, fill #1

## 2023-08-30 NOTE — Therapy (Addendum)
OUTPATIENT PHYSICAL THERAPY LOWER EXTREMITY TREATMENT   Patient Name: [name] MRN: [id number] DOB:09-01-1950, 73 y.o., female Today's Date: 08/30/2023  END OF SESSION:  PT End of Session - 08/30/23 1611     Visit Number 2    Number of Visits 9    Date for PT Re-Evaluation 10/16/23    Authorization Type [employer]    PT Start Time 1615    PT Stop Time 1655    PT Time Calculation (min) 40 min    Activity Tolerance Patient tolerated treatment well    Behavior During Therapy [facility] for tasks assessed/performed              Past Medical History:  Diagnosis Date   Anemia    Anxiety    Arthritis    Complication of anesthesia    past hx. laryngospasm following 2 past surgeries, not recent   Depression    Exertional dyspnea 08/04/2019   GERD (gastroesophageal reflux disease)    Hiatal hernia 10/17/2019   Hyperlipidemia    Insomnia    periodically uses med.   Perforated sigmoid colon (HCC) 04/28/2014   during colonoscopy, "rupture colon"became septic, has colostomy and open surgical wound   Pneumonia    Pre-diabetes    Prediabetes 08/09/2017   Primary hypertension 01/05/2023   Suicide attempt (HCC)    Vitamin D deficiency    Past Surgical History:  Procedure Laterality Date   ABDOMINAL HYSTERECTOMY  1999   TAH/BSO   APPLICATION OF WOUND VAC  04/28/2014   Procedure: APPLICATION OF WOUND VAC;  Surgeon: Emelia Loron, MD;  Location: [facility] OR;  Service: General;;   BLEPHAROPLASTY  2010   BREAST SURGERY  2001   Breast reduction   COLOSTOMY N/A 04/28/2014   Procedure: COLOSTOMY;  Surgeon: Emelia Loron, MD;  Location: [facility] OR;  Service: General;  Laterality: N/A;   COLOSTOMY REVISION N/A 04/28/2014   Procedure: COLON RESECTION SIGMOID;  Surgeon: Emelia Loron, MD;  Location: [facility] OR;  Service: General;  Laterality: N/A;   COLOSTOMY TAKEDOWN N/A 09/02/2014   Procedure: LAPAROSCOPIC COLOSTOMY REVERSAL;  Surgeon: Romie Levee, MD;  Location: [facility] ORS;   Service: General;  Laterality: N/A;   COSMETIC SURGERY     DEBRIDEMENT TENNIS ELBOW     DILATION AND CURETTAGE OF UTERUS     x3, hysteroscopy   ELBOW SURGERY  2007   EYE SURGERY     Blepharoplasty   LAPAROTOMY N/A 04/28/2014   Procedure: EXPLORATORY LAPAROTOMY,SIGMOID COLECTOMY;  Surgeon: Emelia Loron, MD;  Location: [facility] OR;  Service: General;  Laterality: N/A;   ORIF WRIST FRACTURE Left 02/19/2021   Procedure: Left wrist open reduction internal fixation and repair as indicated;  Surgeon: Bradly Bienenstock, MD;  Location: [facility] OR;  Service: Orthopedics;  Laterality: Left;    REDUCTION MAMMAPLASTY Bilateral    scar and adhesion repair  06/01/15   and liposuction   TOTAL KNEE ARTHROPLASTY Right 06/27/2022   Procedure: RIGHT TOTAL KNEE ARTHROPLASTY;  Surgeon: Marcene Corning, MD;  Location: [facility] ORS;  Service: Orthopedics;  Laterality: Right;   ULNAR TUNNEL RELEASE Left 2010   Patient Active Problem List   Diagnosis Date Noted   Primary hypertension 01/05/2023   Weakness of both arms 12/24/2022   Weight gain, abnormal 12/24/2022   Decreased energy 12/24/2022   Abdominal bloating 12/24/2022   Unexplained night sweats 12/24/2022   Hyponatremia 12/24/2022   Tachycardia 12/24/2022   Acute cough 12/24/2022   Decreased appetite 12/24/2022   Nonintractable headache  12/24/2022   Enlarged thyroid 12/24/2022   Myalgia due to statin 09/26/2022   Aortic atherosclerosis (HCC) 09/26/2022   S/P total knee arthroplasty, 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   Breast hematoma 04/04/2022   Vitamin D deficiency 12/02/2020   Postmenopausal 12/02/2020   H/O abdominal hysterectomy 12/02/2020   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   Chronic insomnia 12/02/2020   Hiatal hernia 10/17/2019   Exertional dyspnea 08/04/2019   Iron deficiency anemia 08/08/2017   S/P colectomy 04/28/2014   Hyperlipidemia LDL goal <70 07/21/2010   Depression, controlled 07/21/2010   GERD 07/21/2010   History of respiratory system disease 07/21/2010     PCP:  Ronnald Nian, MD Chilton Si, MD  REFERRING PROVIDER: [name], PA-C  REFERRING DIAG:  [phone].[name] (ICD-10-CM) - Fall, initial encounter  R26.89 (ICD-10-CM) - Balance problem   THERAPY DIAG:  Other abnormalities of gait and mobility  Muscle weakness (generalized)  Rationale for Evaluation and Treatment: Rehabilitation  ONSET DATE: Chronic, November 2023  SUBJECTIVE:   SUBJECTIVE STATEMENT: Patient returned to PT feeling about the same as last session. Says she is still finding herself losing balance during directional changes and dual tasking. Reports being compliant with HEP at home.   EVAL: Patient is coming in with c/c of balance issues she states have been occurring since her total knee replacement on R side. Patient complaints of dizziness noted could be due to GI problems and rapid weight loss. Pt reports her doctor is aware of the previous issues and is working with the pt to find a solution. Pt complains of losing balance in tasks like getting down on the floor, putting pants on while standing, standing on surfaces, etc. She states she notices balance getting worse and has to focused on balance to stay stable. Patient wants to be proactive in improving balance issues to be able to continue participation and function in daily life.   PERTINENT HISTORY: TKA in November of 2023 PAIN:  Are you having pain? No  PRECAUTIONS: Fall  RED FLAGS: None   WEIGHT BEARING RESTRICTIONS: No  FALLS:  Has patient fallen in last 6 months? Yes. Number of falls 2 Most falls due to dizziness   LIVING ENVIRONMENT: Lives with: lives alone Lives in: House/apartment Stairs: Yes, internal and external but does not have concern about stairs.  Has following equipment at home: None  OCCUPATION: N/A  PLOF: Independent  PATIENT GOALS: Wants to get back to ADLs, wants to get back to the gym.   OBJECTIVE:  Note: Objective measures were completed at Evaluation  unless otherwise noted.  PATIENT SURVEYS:  FOTO: 58% functional status  COGNITION: Overall cognitive status: Within functional limits for tasks assessed     SENSATION: Not tested  POSTURE: rounded shoulders and forward head   LOWER EXTREMITY ROM:  Active ROM Right eval Left eval  Hip flexion    Hip extension    Hip abduction    Hip adduction    Hip internal rotation    Hip external rotation    Knee flexion    Knee extension    Ankle dorsiflexion    Ankle plantarflexion    Ankle inversion    Ankle eversion     (Blank rows = not tested)  LOWER EXTREMITY MMT:  MMT Right eval Left eval  Hip flexion 3+ 3+  Hip extension    Hip abduction 4 4  Hip adduction    Hip internal rotation    Hip external rotation    Knee flexion 5 5  Knee extension 5 5  Ankle dorsiflexion 5 5  Ankle plantarflexion 5 5  Ankle inversion    Ankle eversion     (Blank rows = not tested)   BERG BALANCE TEST Sitting to Standing: 4.      Stands without using hands and stabilize independently Standing Unsupported: 4.      Stands safely for 2 minutes Sitting Unsupported: 4.     Sits for 2 minutes independently Standing to Sitting: 4.     Sits safely with minimal use of hands Transfers: 4.     Transfers safely with minor use of hands Standing with eyes closed: 4.     Stands safely for 10 seconds  Standing with feet together: 4.     Stands for 1 minute safely Reaching forward with outstretched arm: 4.     Reaches forward 10 inches Retrieving object from the floor: 4.      Able to pick up easily and safely Turning to look behind: 4.     Looks behind from both sides and weight shifts well Turning 360 degrees: 3.     Able to turn on one side in </= 4 seconds - Loss of balance Place alternate foot on stool: 4.     Completes 8 steps in 20 seconds     Standing with one foot in front: 4.     Independent tandem for 30 seconds  Standing on one foot: 4.     Holds >10 seconds  Total Score:  55/56  08/30/2023: Functional Gait Assessment: 26/30  GAIT: Distance walked: 185 ft Assistive device utilized: None Level of assistance: Complete Independence Comments: Pt walked for 2 minutes with minimal loss of balance. Limited ankle dorsiflexion, knee flexion, and hip flexion specifically on the right LE limiting ability to ambulate without tripping. Right foot sits in slight eversion.                                                                                                               TREATMENT DATE:  08/30/2023  Therapeutic Exercise:  Standing tandeum balance small stance 2x30"  Standeum tandeum balance wide stance with head turns 2x30"  Supine hip abduction with pilates ring 2x15 Step up and step down obstacle clearance on 4 in, 6 in, and 8 in boxes repetitively  LAQ #5 2x15  Dynamic gait over hurdles with change of direction x5 min Therapeutic Activity: Functional gait assessment done to find specific gait disturbances  08/21/2023 Therapeutic Exercise:  Standing hip abduction RTB at ankles 2x10  Standing hip flexion RTB at feet 2x10 Standing SL balance with opposite arm movement 2x30 sec BL Standing toe taps on 6 in box 1x10 focusing on clearance of toe and heel   PATIENT EDUCATION:  Education details: Exam findings, POC, HEP.  Person educated: Patient Education method: Explanation, Demonstration, Verbal cues, and Handouts Education comprehension: verbalized understanding, returned demonstration, verbal cues required, and needs further education  HOME EXERCISE PROGRAM: Access Code: [email or web address]  ASSESSMENT:  CLINICAL IMPRESSION: Patient presented to clinic today  with no adverse effects to last session. Responded well to PT today. Continued to show gait disturbances when dual tasking, in a narrow stance, and with change of direction. Dynamic balance control continues to be below expected level. She scored a 26/30 on the functional gait assessment and required gait  belt assistance when losing balance. Continues to need UE support during tandem stance balance and SL stance. No changes made to HEP today. Patient would benefit from continued skilled PT in order to improve balance to increase functional ability without balance loss.   EVAL: Patient is a 73 y.o. female who was seen today for physical therapy evaluation and treatment for balance deficits. Strength deficits specially in the hip likely impacting stability. Gait disturbances potentially due to compensations after right TKA noted. Pt performs stable balance with limited difficulty but struggles when movement of UE or head is involved. Patient requires extreme focus to maintain stable balance. Today, FOTO was done indicating a 58% functional status. Hip strengthening was introduced as well as beginning balance training. Patient would benefit from skilled PT in order to improve balance to be able to participate in daily activities without concerns of balance loss.   OBJECTIVE IMPAIRMENTS: Abnormal gait, decreased balance, decreased strength, and dizziness.   ACTIVITY LIMITATIONS: bending, standing, stairs, transfers, and locomotion level  PARTICIPATION LIMITATIONS: community activity  PERSONAL FACTORS: Age, Past/current experiences, and Time since onset of injury/illness/exacerbation are also affecting patient's functional outcome.   REHAB POTENTIAL: Good  CLINICAL DECISION MAKING: Stable/uncomplicated  EVALUATION COMPLEXITY: Low   GOALS: Goals reviewed with patient? Yes  SHORT TERM GOALS: Target date: 09/18/2023 Patient will be I with initial HEP in order to progress with therapy. Baseline: Goal status: INITIAL  2.  Patient will increase hip flexion strength to a 4/5 to improve gait limitations. Baseline: 3+/5 Goal status: INITIAL   LONG TERM GOALS: Target date: 10/16/2023  Patient will be independent with final HEP in order to continue treatment at home for pain tolerance.  Baseline:   Goal status: INITIAL  2.  Patient will score at least 65% on FOTO to signify clinically meaningful improvement in functional abilities.  Baseline: 58% Goal status: INITIAL  3.  Patient will increase strength to a 5/5 in the LE to improve gait disturbances to allow for increased participation in daily activities.  Baseline: 3+/5 and 4/5 Goal status: INITIAL  4. Will assess DGI to assess dynamic gait disturbances and update goal based on results..  Baseline:   Goal Status: INITIAL  5. Patient will be able to transfer from the floor to a seat with no loss of balance to improve functional ability in the home.   Baseline:   Goal Status: INITIAL  PLAN:  PT FREQUENCY: 1x/week  PT DURATION: 8 weeks  PLANNED INTERVENTIONS: 97164- PT Re-evaluation, 97110-Therapeutic exercises, 97530- Therapeutic activity, 97112- Neuromuscular re-education, 97535- Self Care, [id number]- Manual therapy, [phone]- Gait training, Patient/Family education, Balance training, Stair training, Joint mobilization, Joint manipulation, Spinal manipulation, Spinal mobilization, Vestibular training, Cryotherapy, and Moist heat  PLAN FOR NEXT SESSION: Review HEP and revise if needed. Continue to work on lower extremity strength (quad). Work on dual tasking, narrow stance, and directional changes during gait next session.    Erin Hearing, Student-PT 08/30/2023, 5:13 PM

## 2023-09-05 ENCOUNTER — Encounter: Payer: Self-pay | Admitting: Physical Therapy

## 2023-09-05 ENCOUNTER — Other Ambulatory Visit: Payer: Self-pay

## 2023-09-05 ENCOUNTER — Other Ambulatory Visit (HOSPITAL_BASED_OUTPATIENT_CLINIC_OR_DEPARTMENT_OTHER): Payer: Self-pay

## 2023-09-05 ENCOUNTER — Ambulatory Visit: Payer: PPO | Admitting: Physical Therapy

## 2023-09-05 DIAGNOSIS — M6281 Muscle weakness (generalized): Secondary | ICD-10-CM

## 2023-09-05 DIAGNOSIS — R2689 Other abnormalities of gait and mobility: Secondary | ICD-10-CM

## 2023-09-05 NOTE — Therapy (Incomplete)
OUTPATIENT PHYSICAL THERAPY LOWER EXTREMITY TREATMENT   Patient Name: [name] MRN: [id number] DOB:22-Oct-1950, 73 y.o., female Today's Date: 09/05/2023  END OF SESSION:  PT End of Session - 09/05/23 1539     Visit Number 3    Number of Visits 9    Date for PT Re-Evaluation 10/16/23    Authorization Type [employer]    PT Start Time 1400    PT Stop Time 1440    PT Time Calculation (min) 40 min    Activity Tolerance Patient tolerated treatment well    Behavior During Therapy [facility] for tasks assessed/performed             Past Medical History:  Diagnosis Date   Anemia    Anxiety    Arthritis    Complication of anesthesia    past hx. laryngospasm following 2 past surgeries, not recent   Depression    Exertional dyspnea 08/04/2019   GERD (gastroesophageal reflux disease)    Hiatal hernia 10/17/2019   Hyperlipidemia    Insomnia    periodically uses med.   Perforated sigmoid colon (HCC) 04/28/2014   during colonoscopy, "rupture colon"became septic, has colostomy and open surgical wound   Pneumonia    Pre-diabetes    Prediabetes 08/09/2017   Primary hypertension 01/05/2023   Suicide attempt (HCC)    Vitamin D deficiency    Past Surgical History:  Procedure Laterality Date   ABDOMINAL HYSTERECTOMY  1999   TAH/BSO   APPLICATION OF WOUND VAC  04/28/2014   Procedure: APPLICATION OF WOUND VAC;  Surgeon: Emelia Loron, MD;  Location: [facility] OR;  Service: General;;   BLEPHAROPLASTY  2010   BREAST SURGERY  2001   Breast reduction   COLOSTOMY N/A 04/28/2014   Procedure: COLOSTOMY;  Surgeon: Emelia Loron, MD;  Location: [facility] OR;  Service: General;  Laterality: N/A;   COLOSTOMY REVISION N/A 04/28/2014   Procedure: COLON RESECTION SIGMOID;  Surgeon: Emelia Loron, MD;  Location: [facility] OR;  Service: General;  Laterality: N/A;   COLOSTOMY TAKEDOWN N/A 09/02/2014   Procedure: LAPAROSCOPIC COLOSTOMY REVERSAL;  Surgeon: Romie Levee, MD;  Location: [facility] ORS;   Service: General;  Laterality: N/A;   COSMETIC SURGERY     DEBRIDEMENT TENNIS ELBOW     DILATION AND CURETTAGE OF UTERUS     x3, hysteroscopy   ELBOW SURGERY  2007   EYE SURGERY     Blepharoplasty   LAPAROTOMY N/A 04/28/2014   Procedure: EXPLORATORY LAPAROTOMY,SIGMOID COLECTOMY;  Surgeon: Emelia Loron, MD;  Location: [facility] OR;  Service: General;  Laterality: N/A;   ORIF WRIST FRACTURE Left 02/19/2021   Procedure: Left wrist open reduction internal fixation and repair as indicated;  Surgeon: Bradly Bienenstock, MD;  Location: [facility] OR;  Service: Orthopedics;  Laterality: Left;    REDUCTION MAMMAPLASTY Bilateral    scar and adhesion repair  06/01/15   and liposuction   TOTAL KNEE ARTHROPLASTY Right 06/27/2022   Procedure: RIGHT TOTAL KNEE ARTHROPLASTY;  Surgeon: Marcene Corning, MD;  Location: [facility] ORS;  Service: Orthopedics;  Laterality: Right;   ULNAR TUNNEL RELEASE Left 2010   Patient Active Problem List   Diagnosis Date Noted   Primary hypertension 01/05/2023   Weakness of both arms 12/24/2022   Weight gain, abnormal 12/24/2022   Decreased energy 12/24/2022   Abdominal bloating 12/24/2022   Unexplained night sweats 12/24/2022   Hyponatremia 12/24/2022   Tachycardia 12/24/2022   Acute cough 12/24/2022   Decreased appetite 12/24/2022   Nonintractable headache 12/24/2022  Enlarged thyroid 12/24/2022   Myalgia due to statin 09/26/2022   Aortic atherosclerosis (HCC) 09/26/2022   S/P total knee arthroplasty, 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   Breast hematoma 04/04/2022   Vitamin D deficiency 12/02/2020   Postmenopausal 12/02/2020   H/O abdominal hysterectomy 12/02/2020   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   Chronic insomnia 12/02/2020   Hiatal hernia 10/17/2019   Exertional dyspnea 08/04/2019   Iron deficiency anemia 08/08/2017   S/P colectomy 04/28/2014   Hyperlipidemia LDL goal <70 07/21/2010   Depression, controlled 07/21/2010   GERD 07/21/2010   History of respiratory system disease 07/21/2010     PCP:  Ronnald Nian, MD Chilton Si, MD  REFERRING PROVIDER: [name], PA-C  REFERRING DIAG:  [phone].[name] (ICD-10-CM) - Fall, initial encounter  R26.89 (ICD-10-CM) - Balance problem   THERAPY DIAG:  Other abnormalities of gait and mobility  Muscle weakness (generalized)  Rationale for Evaluation and Treatment: Rehabilitation  ONSET DATE: Chronic, November 2023  SUBJECTIVE:   SUBJECTIVE STATEMENT: Patient returned to PT feeling the same as last session. Says she is still finding herself losing balance during walking when distracted or when changing directions. Reports being compliant with HEP at home.   EVAL: Patient is coming in with c/c of balance issues she states have been occurring since her total knee replacement on R side. Patient complaints of dizziness noted could be due to GI problems and rapid weight loss. Pt reports her doctor is aware of the previous issues and is working with the pt to find a solution. Pt complains of losing balance in tasks like getting down on the floor, putting pants on while standing, standing on surfaces, etc. She states she notices balance getting worse and has to focused on balance to stay stable. Patient wants to be proactive in improving balance issues to be able to continue participation and function in daily life.   PERTINENT HISTORY: TKA in November of 2023 PAIN:  Are you having pain? No  PRECAUTIONS: Fall  RED FLAGS: None   WEIGHT BEARING RESTRICTIONS: No  FALLS:  Has patient fallen in last 6 months? Yes. Number of falls 2 Most falls due to dizziness   LIVING ENVIRONMENT: Lives with: lives alone Lives in: House/apartment Stairs: Yes, internal and external but does not have concern about stairs.  Has following equipment at home: None  OCCUPATION: N/A  PLOF: Independent  PATIENT GOALS: Wants to get back to ADLs, wants to get back to the gym.   OBJECTIVE:  Note: Objective measures were completed at  Evaluation unless otherwise noted.  PATIENT SURVEYS:  FOTO: 58% functional status  COGNITION: Overall cognitive status: Within functional limits for tasks assessed     SENSATION: Not tested  POSTURE: rounded shoulders and forward head   LOWER EXTREMITY ROM:  Active ROM Right eval Left eval  Hip flexion    Hip extension    Hip abduction    Hip adduction    Hip internal rotation    Hip external rotation    Knee flexion    Knee extension    Ankle dorsiflexion    Ankle plantarflexion    Ankle inversion    Ankle eversion     (Blank rows = not tested)  LOWER EXTREMITY MMT:  MMT Right eval Left eval  Hip flexion 3+ 3+  Hip extension    Hip abduction 4 4  Hip adduction    Hip internal rotation    Hip external rotation    Knee flexion 5 5  Knee  extension 5 5  Ankle dorsiflexion 5 5  Ankle plantarflexion 5 5  Ankle inversion    Ankle eversion     (Blank rows = not tested)   BERG BALANCE TEST Sitting to Standing: 4.      Stands without using hands and stabilize independently Standing Unsupported: 4.      Stands safely for 2 minutes Sitting Unsupported: 4.     Sits for 2 minutes independently Standing to Sitting: 4.     Sits safely with minimal use of hands Transfers: 4.     Transfers safely with minor use of hands Standing with eyes closed: 4.     Stands safely for 10 seconds  Standing with feet together: 4.     Stands for 1 minute safely Reaching forward with outstretched arm: 4.     Reaches forward 10 inches Retrieving object from the floor: 4.      Able to pick up easily and safely Turning to look behind: 4.     Looks behind from both sides and weight shifts well Turning 360 degrees: 3.     Able to turn on one side in </= 4 seconds - Loss of balance Place alternate foot on stool: 4.     Completes 8 steps in 20 seconds     Standing with one foot in front: 4.     Independent tandem for 30 seconds  Standing on one foot: 4.     Holds >10 seconds  Total Score:  55/56  08/30/2023: Functional Gait Assessment: 26/30  GAIT: Distance walked: 185 ft Assistive device utilized: None Level of assistance: Complete Independence Comments: Pt walked for 2 minutes with minimal loss of balance. Limited ankle dorsiflexion, knee flexion, and hip flexion specifically on the right LE limiting ability to ambulate without tripping. Right foot sits in slight eversion.                                                                                                               TREATMENT DATE:  09/05/2023 Therapeutic Exercise:  Elliptical x3 min  Sit to stand 3x10, 1 BW and 2 10#  Bridges 1x10 BW  Supine clam shells 2x15 GTB  Hip flexor arc over dumbbell 2x15 Pelvic tilt 2x10  Dual tasking gait with ball toss  SLS 2x30"  Tandeum on airex 2x30" both legs in front   08/30/2023  Therapeutic Exercise:  Standing tandeum balance small stance 2x30"  Standeum tandeum balance wide stance with head turns 2x30"  Supine hip abduction with pilates ring 2x15 Step up and step down obstacle clearance on 4 in, 6 in, and 8 in boxes repetitively  LAQ #5 2x15  Dynamic gait over hurdles with change of direction x5 min Therapeutic Activity: Functional gait assessment done to find specific gait disturbances  08/21/2023 Therapeutic Exercise:  Standing hip abduction RTB at ankles 2x10  Standing hip flexion RTB at feet 2x10 Standing SL balance with opposite arm movement 2x30 sec BL Standing toe taps on 6 in box 1x10 focusing on clearance  of toe and heel   PATIENT EDUCATION:  Education details: HEP.  Person educated: Patient Education method: Explanation, Demonstration, Verbal cues Education comprehension: verbalized understanding, returned demonstration, verbal cues required, and needs further education  HOME EXERCISE PROGRAM: Access Code: [email or web address]  ASSESSMENT:  CLINICAL IMPRESSION Patient presented to clinic today with no adverse effects to last session. Responded well to  PT today. Continued to progress with LE strength this session. Once fatigued, PT focused on dual tasking and balance both SL and DL. Has made progress in balance, holding tandem stance on airex pad for 30 seconds on both legs. Continues to have limitations in dual tasking, directional changes, and SL balance. Patient would benefit from continued skilled PT in order to improve balance to increase functional ability.   EVAL: Patient is a 73 y.o. female who was seen today for physical therapy evaluation and treatment for balance deficits. Strength deficits specially in the hip likely impacting stability. Gait disturbances potentially due to compensations after right TKA noted. Pt performs stable balance with limited difficulty but struggles when movement of UE or head is involved. Patient requires extreme focus to maintain stable balance. Today, FOTO was done indicating a 58% functional status. Hip strengthening was introduced as well as beginning balance training. Patient would benefit from skilled PT in order to improve balance to be able to participate in daily activities without concerns of balance loss.   OBJECTIVE IMPAIRMENTS: Abnormal gait, decreased balance, decreased strength, and dizziness.   ACTIVITY LIMITATIONS: bending, standing, stairs, transfers, and locomotion level  PARTICIPATION LIMITATIONS: community activity  PERSONAL FACTORS: Age, Past/current experiences, and Time since onset of injury/illness/exacerbation are also affecting patient's functional outcome.   REHAB POTENTIAL: Good  CLINICAL DECISION MAKING: Stable/uncomplicated  EVALUATION COMPLEXITY: Low   GOALS: Goals reviewed with patient? Yes  SHORT TERM GOALS: Target date: 09/18/2023 Patient will be I with initial HEP in order to progress with therapy. Baseline: Goal status: INITIAL  2.  Patient will increase hip flexion strength to a 4/5 to improve gait limitations. Baseline: 3+/5 Goal status: INITIAL   LONG TERM  GOALS: Target date: 10/16/2023  Patient will be independent with final HEP in order to continue treatment at home for pain tolerance.  Baseline:  Goal status: INITIAL  2.  Patient will score at least 65% on FOTO to signify clinically meaningful improvement in functional abilities.  Baseline: 58% Goal status: INITIAL  3.  Patient will increase strength to a 5/5 in the LE to improve gait disturbances to allow for increased participation in daily activities.  Baseline: 3+/5 and 4/5 Goal status: INITIAL  4. Will assess DGI to assess dynamic gait disturbances and update goal based on results..  Baseline:   Goal Status: INITIAL  5. Patient will be able to transfer from the floor to a seat with no loss of balance to improve functional ability in the home.   Baseline:   Goal Status: INITIAL  PLAN:  PT FREQUENCY: 1x/week  PT DURATION: 8 weeks  PLANNED INTERVENTIONS: [id number]- PT Re-evaluation, 97110-Therapeutic exercises, 97530- Therapeutic activity, 97112- Neuromuscular re-education, 97535- Self Care, [id number]- Manual therapy, [phone]- Gait training, Patient/Family education, Balance training, Stair training, Joint mobilization, Joint manipulation, Spinal manipulation, Spinal mobilization, Vestibular training, Cryotherapy, and Moist heat  PLAN FOR NEXT SESSION: Review HEP and revise if needed. Continue to work on lower extremity strength (quad). Work on dual tasking, narrow stance, and directional changes during gait next session.    Erin Hearing, Student-PT 09/05/2023, 3:41 PM

## 2023-09-06 ENCOUNTER — Other Ambulatory Visit: Payer: Self-pay

## 2023-09-06 ENCOUNTER — Other Ambulatory Visit (HOSPITAL_BASED_OUTPATIENT_CLINIC_OR_DEPARTMENT_OTHER): Payer: Self-pay

## 2023-09-08 ENCOUNTER — Other Ambulatory Visit (HOSPITAL_BASED_OUTPATIENT_CLINIC_OR_DEPARTMENT_OTHER): Payer: Self-pay

## 2023-09-12 ENCOUNTER — Other Ambulatory Visit: Payer: Self-pay

## 2023-09-12 ENCOUNTER — Encounter: Payer: Self-pay | Admitting: Physical Therapy

## 2023-09-12 ENCOUNTER — Ambulatory Visit: Payer: PPO | Attending: Medical | Admitting: Physical Therapy

## 2023-09-12 DIAGNOSIS — M6281 Muscle weakness (generalized): Secondary | ICD-10-CM | POA: Diagnosis not present

## 2023-09-12 DIAGNOSIS — R2689 Other abnormalities of gait and mobility: Secondary | ICD-10-CM | POA: Insufficient documentation

## 2023-09-12 NOTE — Therapy (Addendum)
 OUTPATIENT PHYSICAL THERAPY LOWER EXTREMITY TREATMENT   Patient Name: [name] MRN: [id number] DOB:09/25/1950, 73 y.o., female Today's Date: 09/12/2023  END OF SESSION:  PT End of Session - 09/12/23 1538     Visit Number 4    Number of Visits 9    Date for PT Re-Evaluation 10/16/23    Authorization Type [employer]    PT Start Time 1445    PT Stop Time 1530    PT Time Calculation (min) 45 min    Activity Tolerance Patient tolerated treatment well    Behavior During Therapy [facility] for tasks assessed/performed              Past Medical History:  Diagnosis Date   Anemia    Anxiety    Arthritis    Complication of anesthesia    past hx. laryngospasm following 2 past surgeries, not recent   Depression    Exertional dyspnea 08/04/2019   GERD (gastroesophageal reflux disease)    Hiatal hernia 10/17/2019   Hyperlipidemia    Insomnia    periodically uses med.   Perforated sigmoid colon (HCC) 04/28/2014   during colonoscopy, rupture colonbecame septic, has colostomy and open surgical wound   Pneumonia    Pre-diabetes    Prediabetes 08/09/2017   Primary hypertension 01/05/2023   Suicide attempt (HCC)    Vitamin D  deficiency    Past Surgical History:  Procedure Laterality Date   ABDOMINAL HYSTERECTOMY  1999   TAH/BSO   APPLICATION OF WOUND VAC  04/28/2014   Procedure: APPLICATION OF WOUND VAC;  Surgeon: Donnice Bury, MD;  Location: [facility] OR;  Service: General;;   BLEPHAROPLASTY  2010   BREAST SURGERY  2001   Breast reduction   COLOSTOMY N/A 04/28/2014   Procedure: COLOSTOMY;  Surgeon: Donnice Bury, MD;  Location: [facility] OR;  Service: General;  Laterality: N/A;   COLOSTOMY REVISION N/A 04/28/2014   Procedure: COLON RESECTION SIGMOID;  Surgeon: Donnice Bury, MD;  Location: [facility] OR;  Service: General;  Laterality: N/A;   COLOSTOMY TAKEDOWN N/A 09/02/2014   Procedure: LAPAROSCOPIC COLOSTOMY REVERSAL;  Surgeon: Bernarda Ned, MD;  Location: [facility] ORS;   Service: General;  Laterality: N/A;   COSMETIC SURGERY     DEBRIDEMENT TENNIS ELBOW     DILATION AND CURETTAGE OF UTERUS     x3, hysteroscopy   ELBOW SURGERY  2007   EYE SURGERY     Blepharoplasty   LAPAROTOMY N/A 04/28/2014   Procedure: EXPLORATORY LAPAROTOMY,SIGMOID COLECTOMY;  Surgeon: Donnice Bury, MD;  Location: [facility] OR;  Service: General;  Laterality: N/A;   ORIF WRIST FRACTURE Left 02/19/2021   Procedure: Left wrist open reduction internal fixation and repair as indicated;  Surgeon: Shari Easter, MD;  Location: [facility] OR;  Service: Orthopedics;  Laterality: Left;    REDUCTION MAMMAPLASTY Bilateral    scar and adhesion repair  06/01/15   and liposuction   TOTAL KNEE ARTHROPLASTY Right 06/27/2022   Procedure: RIGHT TOTAL KNEE ARTHROPLASTY;  Surgeon: Sheril Coy, MD;  Location: [facility] ORS;  Service: Orthopedics;  Laterality: Right;   ULNAR TUNNEL RELEASE Left 2010   Patient Active Problem List   Diagnosis Date Noted   Primary hypertension 01/05/2023   Weakness of both arms 12/24/2022   Weight gain, abnormal 12/24/2022   Decreased energy 12/24/2022   Abdominal bloating 12/24/2022   Unexplained night sweats 12/24/2022   Hyponatremia 12/24/2022   Tachycardia 12/24/2022   Acute cough 12/24/2022   Decreased appetite 12/24/2022   Nonintractable headache  12/24/2022   Enlarged thyroid  12/24/2022   Myalgia due to statin 09/26/2022   Aortic atherosclerosis (HCC) 09/26/2022   S/P total knee arthroplasty, 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   Breast hematoma 04/04/2022   Vitamin D  deficiency 12/02/2020   Postmenopausal 12/02/2020   H/O abdominal hysterectomy 12/02/2020   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   Chronic insomnia 12/02/2020   Hiatal hernia 10/17/2019   Exertional dyspnea 08/04/2019   Iron deficiency anemia 08/08/2017   S/P colectomy 04/28/2014   Hyperlipidemia LDL goal <70 07/21/2010   Depression, controlled 07/21/2010   GERD 07/21/2010   History of respiratory system disease 07/21/2010     PCP:  Joyce Norleen BROCKS, MD Raford Riggs, MD  REFERRING PROVIDER: Bulah Alm RAMAN, PA-C  REFERRING DIAG:  [phone].[email or web address] (ICD-10-CM) - Fall, initial encounter  R26.89 (ICD-10-CM) - Balance problem   THERAPY DIAG:  Other abnormalities of gait and mobility  Muscle weakness (generalized)  Rationale for Evaluation and Treatment: Rehabilitation  ONSET DATE: Chronic, November 2023  SUBJECTIVE:   SUBJECTIVE STATEMENT: Patient returned to PT feeling stiff in the right knee. Overall, she is seeing an improvement in her function. She is compliant with HEP and has started working out again at the gym (core and stretching).   EVAL: Patient is coming in with c/c of balance issues she states have been occurring since her total knee replacement on R side. Patient complaints of dizziness noted could be due to GI problems and rapid weight loss. Pt reports her doctor is aware of the previous issues and is working with the pt to find a solution. Pt complains of losing balance in tasks like getting down on the floor, putting pants on while standing, standing on surfaces, etc. She states she notices balance getting worse and has to focused on balance to stay stable. Patient wants to be proactive in improving balance issues to be able to continue participation and function in daily life.   PERTINENT HISTORY: TKA in November of 2023 PAIN:  Are you having pain? No  PRECAUTIONS: Fall  RED FLAGS: None   WEIGHT BEARING RESTRICTIONS: No  FALLS:  Has patient fallen in last 6 months? Yes. Number of falls 2 Most falls due to dizziness   LIVING ENVIRONMENT: Lives with: lives alone Lives in: House/apartment Stairs: Yes, internal and external but does not have concern about stairs.  Has following equipment at home: None  OCCUPATION: N/A  PLOF: Independent  PATIENT GOALS: Wants to get back to ADLs, wants to get back to the gym.   OBJECTIVE:  Note: Objective measures were completed at  Evaluation unless otherwise noted.  PATIENT SURVEYS:  FOTO: 58% functional status  COGNITION: Overall cognitive status: Within functional limits for tasks assessed     SENSATION: Not tested  POSTURE: rounded shoulders and forward head   LOWER EXTREMITY ROM:  Active ROM Right eval Left eval  Hip flexion    Hip extension    Hip abduction    Hip adduction    Hip internal rotation    Hip external rotation    Knee flexion    Knee extension    Ankle dorsiflexion    Ankle plantarflexion    Ankle inversion    Ankle eversion     (Blank rows = not tested)  LOWER EXTREMITY MMT:  MMT Right eval Left eval  Hip flexion 3+ 3+  Hip extension    Hip abduction 4 4  Hip adduction    Hip internal rotation    Hip external rotation  Knee flexion 5 5  Knee extension 5 5  Ankle dorsiflexion 5 5  Ankle plantarflexion 5 5  Ankle inversion    Ankle eversion     (Blank rows = not tested)   BERG BALANCE TEST Sitting to Standing: 4.      Stands without using hands and stabilize independently Standing Unsupported: 4.      Stands safely for 2 minutes Sitting Unsupported: 4.     Sits for 2 minutes independently Standing to Sitting: 4.     Sits safely with minimal use of hands Transfers: 4.     Transfers safely with minor use of hands Standing with eyes closed: 4.     Stands safely for 10 seconds  Standing with feet together: 4.     Stands for 1 minute safely Reaching forward with outstretched arm: 4.     Reaches forward 10 inches Retrieving object from the floor: 4.      Able to pick up easily and safely Turning to look behind: 4.     Looks behind from both sides and weight shifts well Turning 360 degrees: 3.     Able to turn on one side in </= 4 seconds - Loss of balance Place alternate foot on stool: 4.     Completes 8 steps in 20 seconds     Standing with one foot in front: 4.     Independent tandem for 30 seconds  Standing on one foot: 4.     Holds >10 seconds  Total Score:  55/56  08/30/2023: Functional Gait Assessment: 26/30  GAIT: Dstance walked: 185 ft Assistive device utilized: None Level of assistance: Complete Independence Comments: Pt walked for 2 minutes with minimal loss of balance. Limited ankle dorsiflexion, knee flexion, and hip flexion specifically on the right LE limiting ability to ambulate without tripping. Right foot sits in slight eversion.                                                                                                               TREATMENT DATE:  09/12/2023  NuStep x3 min lvl5  Seated hamstring stretch 2x30  Sit to stand 2x10 #10  Supine Bridges 1x15  Monster Walks 2 laps GTB at [employer] lateral walks x5 laps  Airex Bean tandeum walks x 3 laps  SLS balance 2x30 BL  Manual Therapy:  STM to hamstring and calf  09/05/2023 Therapeutic Exercise:  Elliptical x3 min  Sit to stand 3x10, 1 BW and 2 10#  Bridges 1x10 BW  Supine clam shells 2x15 GTB  Hip flexor arc over dumbbell 2x15 Pelvic tilt 2x10  Dual tasking gait with ball toss  SLS 2x30  Tandeum on airex 2x30 both legs in front   08/30/2023  Therapeutic Exercise:  Standing tandeum balance small stance 2x30  Standeum tandeum balance wide stance with head turns 2x30  Supine hip abduction with pilates ring 2x15 Step up and step down obstacle clearance on 4 in, 6 in, and 8 in boxes repetitively  LAQ #  5 2x15  Dynamic gait over hurdles with change of direction x5 min Therapeutic Activity: Functional gait assessment done to find specific gait disturbances  08/21/2023 Therapeutic Exercise:  Standing hip abduction RTB at ankles 2x10  Standing hip flexion RTB at feet 2x10 Standing SL balance with opposite arm movement 2x30 sec BL Standing toe taps on 6 in box 1x10 focusing on clearance of toe and heel   PATIENT EDUCATION:  Education details: HEP.  Person educated: Patient Education method: Explanation, Demonstration, Verbal cues Education  comprehension: verbalized understanding, returned demonstration, verbal cues required, and needs further education  HOME EXERCISE PROGRAM: Access Code: [email or web address]  ASSESSMENT:  CLINICAL IMPRESSION Patient presented to clinic today with no adverse effects to last session. Came in with knee stiffness due to hamstring and calf tightness that manual therapy was done for. Responded well to PT today. Patient continues to have balance deficits specifically in dual tasking, narrow stance, and with SL balance. PT is progressing SL balance ability, able to hold a SL stance for about 20 seconds before falling. Progressed hip abduction to standing monster walks. Continues to progress in her balance abilities and LE strength. Discussed POC and will schedule out for another month. Will review and revise HEP next session. Continued skilled PT will be beneficial in order to increase patient LE strength and balance to perform ADLs as wanted.  EVAL: Patient is a 73 y.o. female who was seen today for physical therapy evaluation and treatment for balance deficits. Strength deficits specially in the hip likely impacting stability. Gait disturbances potentially due to compensations after right TKA noted. Pt performs stable balance with limited difficulty but struggles when movement of UE or head is involved. Patient requires extreme focus to maintain stable balance. Today, FOTO was done indicating a 58% functional status. Hip strengthening was introduced as well as beginning balance training. Patient would benefit from skilled PT in order to improve balance to be able to participate in daily activities without concerns of balance loss.   OBJECTIVE IMPAIRMENTS: Abnormal gait, decreased balance, decreased strength, and dizziness.   ACTIVITY LIMITATIONS: bending, standing, stairs, transfers, and locomotion level  PARTICIPATION LIMITATIONS: community activity  PERSONAL FACTORS: Age, Past/current experiences, and Time since  onset of injury/illness/exacerbation are also affecting patient's functional outcome.   REHAB POTENTIAL: Good  CLINICAL DECISION MAKING: Stable/uncomplicated  EVALUATION COMPLEXITY: Low   GOALS: Goals reviewed with patient? Yes  SHORT TERM GOALS: Target date: 09/18/2023 Patient will be I with initial HEP in order to progress with therapy. Baseline: Goal status: INITIAL  2.  Patient will increase hip flexion strength to a 4/5 to improve gait limitations. Baseline: 3+/5 Goal status: INITIAL   LONG TERM GOALS: Target date: 10/16/2023  Patient will be independent with final HEP in order to continue treatment at home for pain tolerance.  Baseline:  Goal status: INITIAL  2.  Patient will score at least 65% on FOTO to signify clinically meaningful improvement in functional abilities.  Baseline: 58% Goal status: INITIAL  3.  Patient will increase strength to a 5/5 in the LE to improve gait disturbances to allow for increased participation in daily activities.  Baseline: 3+/5 and 4/5 Goal status: INITIAL  4. Will assess DGI to assess dynamic gait disturbances and update goal based on results..  Baseline:   Goal Status: INITIAL  5. Patient will be able to transfer from the floor to a seat with no loss of balance to improve functional ability in the home.   Baseline:  Goal Status: INITIAL  PLAN:  PT FREQUENCY: 1x/week  PT DURATION: 8 weeks  PLANNED INTERVENTIONS: [id number]- PT Re-evaluation, 97110-Therapeutic exercises, 97530- Therapeutic activity, 97112- Neuromuscular re-education, 97535- Self Care, [id number]- Manual therapy, [phone]- Gait training, Patient/Family education, Balance training, Stair training, Joint mobilization, Joint manipulation, Spinal manipulation, Spinal mobilization, Vestibular training, Cryotherapy, and Moist heat  PLAN FOR NEXT SESSION: Review HEP and increase functional activity next session.. Continue to work on lower extremity strength (quad). Work on dual  tasking, narrow stance, and directional changes during gait next session. Review goals next session. Begin training floor to chair transfers.   Kent Hummer, Student-PT 09/12/2023, 3:56 PM

## 2023-09-17 ENCOUNTER — Other Ambulatory Visit: Payer: Self-pay

## 2023-09-17 ENCOUNTER — Other Ambulatory Visit (HOSPITAL_BASED_OUTPATIENT_CLINIC_OR_DEPARTMENT_OTHER): Payer: Self-pay

## 2023-09-19 ENCOUNTER — Ambulatory Visit: Payer: PPO | Admitting: Physical Therapy

## 2023-09-19 ENCOUNTER — Encounter: Payer: Self-pay | Admitting: Physical Therapy

## 2023-09-19 ENCOUNTER — Other Ambulatory Visit: Payer: Self-pay

## 2023-09-19 ENCOUNTER — Other Ambulatory Visit (HOSPITAL_BASED_OUTPATIENT_CLINIC_OR_DEPARTMENT_OTHER): Payer: Self-pay

## 2023-09-19 DIAGNOSIS — M6281 Muscle weakness (generalized): Secondary | ICD-10-CM

## 2023-09-19 DIAGNOSIS — R2689 Other abnormalities of gait and mobility: Secondary | ICD-10-CM | POA: Diagnosis not present

## 2023-09-19 NOTE — Therapy (Addendum)
OUTPATIENT PHYSICAL THERAPY LOWER EXTREMITY TREATMENT   Patient Name: [name] MRN: [id number] DOB:April 19, 1951, 73 y.o., female Today's Date: 09/19/2023   END OF SESSION:  PT End of Session - 09/19/23 1444     Visit Number 5    Number of Visits 9    Date for PT Re-Evaluation 10/16/23    Authorization Type [employer]    PT Start Time 1400    PT Stop Time 1445    PT Time Calculation (min) 45 min    Activity Tolerance Patient tolerated treatment well    Behavior During Therapy [facility] for tasks assessed/performed            Past Medical History:  Diagnosis Date   Anemia    Anxiety    Arthritis    Complication of anesthesia    past hx. laryngospasm following 2 past surgeries, not recent   Depression    Exertional dyspnea 08/04/2019   GERD (gastroesophageal reflux disease)    Hiatal hernia 10/17/2019   Hyperlipidemia    Insomnia    periodically uses med.   Perforated sigmoid colon (HCC) 04/28/2014   during colonoscopy, "rupture colon"became septic, has colostomy and open surgical wound   Pneumonia    Pre-diabetes    Prediabetes 08/09/2017   Primary hypertension 01/05/2023   Suicide attempt (HCC)    Vitamin D deficiency    Past Surgical History:  Procedure Laterality Date   ABDOMINAL HYSTERECTOMY  1999   TAH/BSO   APPLICATION OF WOUND VAC  04/28/2014   Procedure: APPLICATION OF WOUND VAC;  Surgeon: Emelia Loron, MD;  Location: [facility] OR;  Service: General;;   BLEPHAROPLASTY  2010   BREAST SURGERY  2001   Breast reduction   COLOSTOMY N/A 04/28/2014   Procedure: COLOSTOMY;  Surgeon: Emelia Loron, MD;  Location: [facility] OR;  Service: General;  Laterality: N/A;   COLOSTOMY REVISION N/A 04/28/2014   Procedure: COLON RESECTION SIGMOID;  Surgeon: Emelia Loron, MD;  Location: [facility] OR;  Service: General;  Laterality: N/A;   COLOSTOMY TAKEDOWN N/A 09/02/2014   Procedure: LAPAROSCOPIC COLOSTOMY REVERSAL;  Surgeon: Romie Levee, MD;  Location: [facility] ORS;   Service: General;  Laterality: N/A;   COSMETIC SURGERY     DEBRIDEMENT TENNIS ELBOW     DILATION AND CURETTAGE OF UTERUS     x3, hysteroscopy   ELBOW SURGERY  2007   EYE SURGERY     Blepharoplasty   LAPAROTOMY N/A 04/28/2014   Procedure: EXPLORATORY LAPAROTOMY,SIGMOID COLECTOMY;  Surgeon: Emelia Loron, MD;  Location: [facility] OR;  Service: General;  Laterality: N/A;   ORIF WRIST FRACTURE Left 02/19/2021   Procedure: Left wrist open reduction internal fixation and repair as indicated;  Surgeon: Bradly Bienenstock, MD;  Location: [facility] OR;  Service: Orthopedics;  Laterality: Left;    REDUCTION MAMMAPLASTY Bilateral    scar and adhesion repair  06/01/15   and liposuction   TOTAL KNEE ARTHROPLASTY Right 06/27/2022   Procedure: RIGHT TOTAL KNEE ARTHROPLASTY;  Surgeon: Marcene Corning, MD;  Location: [facility] ORS;  Service: Orthopedics;  Laterality: Right;   ULNAR TUNNEL RELEASE Left 2010   Patient Active Problem List   Diagnosis Date Noted   Primary hypertension 01/05/2023   Weakness of both arms 12/24/2022   Weight gain, abnormal 12/24/2022   Decreased energy 12/24/2022   Abdominal bloating 12/24/2022   Unexplained night sweats 12/24/2022   Hyponatremia 12/24/2022   Tachycardia 12/24/2022   Acute cough 12/24/2022   Decreased appetite 12/24/2022   Nonintractable headache 12/24/2022  Enlarged thyroid 12/24/2022   Myalgia due to statin 09/26/2022   Aortic atherosclerosis (HCC) 09/26/2022   S/P total knee arthroplasty, 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   Breast hematoma 04/04/2022   Vitamin D deficiency 12/02/2020   Postmenopausal 12/02/2020   H/O abdominal hysterectomy 12/02/2020   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   Chronic insomnia 12/02/2020   Hiatal hernia 10/17/2019   Exertional dyspnea 08/04/2019   Iron deficiency anemia 08/08/2017   S/P colectomy 04/28/2014   Hyperlipidemia LDL goal <70 07/21/2010   Depression, controlled 07/21/2010   GERD 07/21/2010   History of respiratory system disease 07/21/2010     PCP:  Ronnald Nian, MD Chilton Si, MD  REFERRING PROVIDER: [name], PA-C  REFERRING DIAG:  [phone].[name] (ICD-10-CM) - Fall, initial encounter  R26.89 (ICD-10-CM) - Balance problem   THERAPY DIAG:  Other abnormalities of gait and mobility  Muscle weakness (generalized)  Rationale for Evaluation and Treatment: Rehabilitation  ONSET DATE: Chronic, November 2023  SUBJECTIVE:   SUBJECTIVE STATEMENT: Patient came to PT today feeling good after the weekend. She went to the gym earlier and she did not feel any pain but only did UE. She is compliant with HEP when she does not go to the gym. She is still noticing positive changes.    EVAL: Patient is coming in with c/c of balance issues she states have been occurring since her total knee replacement on R side. Patient complaints of dizziness noted could be due to GI problems and rapid weight loss. Pt reports her doctor is aware of the previous issues and is working with the pt to find a solution. Pt complains of losing balance in tasks like getting down on the floor, putting pants on while standing, standing on surfaces, etc. She states she notices balance getting worse and has to focused on balance to stay stable. Patient wants to be proactive in improving balance issues to be able to continue participation and function in daily life.   PERTINENT HISTORY: TKA in November of 2023 PAIN:  Are you having pain? No  PRECAUTIONS: Fall  RED FLAGS: None   WEIGHT BEARING RESTRICTIONS: No  FALLS:  Has patient fallen in last 6 months? Yes. Number of falls 2 Most falls due to dizziness   LIVING ENVIRONMENT: Lives with: lives alone Lives in: House/apartment Stairs: Yes, internal and external but does not have concern about stairs.  Has following equipment at home: None  OCCUPATION: N/A  PLOF: Independent  PATIENT GOALS: Wants to get back to ADLs, wants to get back to the gym.    OBJECTIVE:  Note: Objective  measures were completed at Evaluation unless otherwise noted.  PATIENT SURVEYS:  FOTO: 58% functional status  COGNITION: Overall cognitive status: Within functional limits for tasks assessed   SENSATION: Not tested  POSTURE: rounded shoulders and forward head  LOWER EXTREMITY ROM:  Active ROM Right eval Left eval  Hip flexion    Hip extension    Hip abduction    Hip adduction    Hip internal rotation    Hip external rotation    Knee flexion    Knee extension    Ankle dorsiflexion    Ankle plantarflexion    Ankle inversion    Ankle eversion     (Blank rows = not tested)  LOWER EXTREMITY MMT:  MMT Right eval Left eval  Hip flexion 3+ 3+  Hip extension    Hip abduction 4 4  Hip adduction    Hip internal rotation  Hip external rotation    Knee flexion 5 5  Knee extension 5 5  Ankle dorsiflexion 5 5  Ankle plantarflexion 5 5  Ankle inversion    Ankle eversion     (Blank rows = not tested)   BERG BALANCE TEST Sitting to Standing: 4.      Stands without using hands and stabilize independently Standing Unsupported: 4.      Stands safely for 2 minutes Sitting Unsupported: 4.     Sits for 2 minutes independently Standing to Sitting: 4.     Sits safely with minimal use of hands Transfers: 4.     Transfers safely with minor use of hands Standing with eyes closed: 4.     Stands safely for 10 seconds  Standing with feet together: 4.     Stands for 1 minute safely Reaching forward with outstretched arm: 4.     Reaches forward 10 inches Retrieving object from the floor: 4.      Able to pick up easily and safely Turning to look behind: 4.     Looks behind from both sides and weight shifts well Turning 360 degrees: 3.     Able to turn on one side in </= 4 seconds - Loss of balance Place alternate foot on stool: 4.     Completes 8 steps in 20 seconds     Standing with one foot in front: 4.     Independent tandem for 30 seconds  Standing on one foot: 4.     Holds >10  seconds  Total Score: 55/56  08/30/2023: Functional Gait Assessment: 26/30   GAIT: Dstance walked: 185 ft Assistive device utilized: None Level of assistance: Complete Independence Comments: Pt walked for 2 minutes with minimal loss of balance. Limited ankle dorsiflexion, knee flexion, and hip flexion specifically on the right LE limiting ability to ambulate without tripping. Right foot sits in slight eversion.                                                                                                                TREATMENT TODAY:  09/19/2023  Elliptical L1 R1 x 3 min Sit to stand 2x15 #10 Supine hip flexor rainbows 2x10 both  Monster Walks 2 laps GTB at [employer] clearance with cones x6 laps Side stepping on bosu ball  Airex Beam lateral walks x5 laps  Airex Beam tandeum walks x4  SLS balance on bosu ball 1x15" each  PATIENT EDUCATION:  Education details: HEP.  Person educated: Patient Education method: Explanation, Demonstration, Verbal cues Education comprehension: verbalized understanding, returned demonstration, verbal cues required, and needs further education  HOME EXERCISE PROGRAM: Access Code: [email or web address]  ASSESSMENT:  CLINICAL IMPRESSION Patient presented to clinic today with no adverse effects to last session. Came in with a little fatigue today but reports she did not sleep well. Responded well to PT. We continued to progress LE strength and then complete balance exercises once fatigued. Added bosu ball exercises today to implement another surface. PT continues to  progress balance deficits; continues to need to improve change of direction, dual tasking, and narrow stance. Continued skilled PT will be beneficial in order to increase patient LE strength and balance to perform ADLs as wanted.   EVAL: Patient is a 73 y.o. female who was seen today for physical therapy evaluation and treatment for balance deficits. Strength deficits specially in the hip likely  impacting stability. Gait disturbances potentially due to compensations after right TKA noted. Pt performs stable balance with limited difficulty but struggles when movement of UE or head is involved. Patient requires extreme focus to maintain stable balance. Today, FOTO was done indicating a 58% functional status. Hip strengthening was introduced as well as beginning balance training. Patient would benefit from skilled PT in order to improve balance to be able to participate in daily activities without concerns of balance loss.   OBJECTIVE IMPAIRMENTS: Abnormal gait, decreased balance, decreased strength, and dizziness.   ACTIVITY LIMITATIONS: bending, standing, stairs, transfers, and locomotion level  PARTICIPATION LIMITATIONS: community activity  PERSONAL FACTORS: Age, Past/current experiences, and Time since onset of injury/illness/exacerbation are also affecting patient's functional outcome.   REHAB POTENTIAL: Good  CLINICAL DECISION MAKING: Stable/uncomplicated  EVALUATION COMPLEXITY: Low   GOALS: Goals reviewed with patient? Yes  SHORT TERM GOALS: Target date: 09/18/2023  Patient will be I with initial HEP in order to progress with therapy. Baseline: Goal status: MET  2.  Patient will increase hip flexion strength to a 4/5 to improve gait limitations. Baseline: 3+/5 Goal status: IN PROGRESS  LONG TERM GOALS: Target date: 10/16/2023  Patient will be independent with final HEP in order to continue treatment at home for pain tolerance.  Baseline:  Goal status: INITIAL  2.  Patient will score at least 65% on FOTO to signify clinically meaningful improvement in functional abilities.  Baseline: 58% Goal status: INITIAL  3.  Patient will increase strength to a 5/5 in the LE to improve gait disturbances to allow for increased participation in daily activities.  Baseline: 3+/5 and 4/5 Goal status: INITIAL  4. Will assess DGI to assess dynamic gait disturbances and update goal  based on results..  Baseline:   Goal Status: INITIAL  5. Patient will be able to transfer from the floor to a seat with no loss of balance to improve functional ability in the home.   Baseline:   Goal Status: INITIAL   PLAN:  PT FREQUENCY: 1x/week  PT DURATION: 8 weeks  PLANNED INTERVENTIONS: 97164- PT Re-evaluation, 97110-Therapeutic exercises, 97530- Therapeutic activity, 97112- Neuromuscular re-education, 97535- Self Care, 16109- Manual therapy, [phone]- Gait training, Patient/Family education, Balance training, Stair training, Joint mobilization, Joint manipulation, Spinal manipulation, Spinal mobilization, Vestibular training, Cryotherapy, and Moist heat  PLAN FOR NEXT SESSION: Review/Revise HEP. Continue to work on lower extremity strength (quad). Work on dual tasking, narrow stance, and directional changes during gait next session. Review goals next session. Floor to chair transfers.  Erin Hearing, Student-PT 09/19/2023, 3:51 PM

## 2023-09-22 ENCOUNTER — Other Ambulatory Visit (HOSPITAL_BASED_OUTPATIENT_CLINIC_OR_DEPARTMENT_OTHER): Payer: Self-pay

## 2023-09-24 ENCOUNTER — Other Ambulatory Visit (HOSPITAL_BASED_OUTPATIENT_CLINIC_OR_DEPARTMENT_OTHER): Payer: Self-pay

## 2023-09-26 ENCOUNTER — Ambulatory Visit: Payer: PPO | Admitting: Physical Therapy

## 2023-10-02 ENCOUNTER — Encounter: Payer: Self-pay | Admitting: Internal Medicine

## 2023-10-03 ENCOUNTER — Other Ambulatory Visit: Payer: Self-pay

## 2023-10-03 ENCOUNTER — Ambulatory Visit: Payer: PPO | Admitting: Physical Therapy

## 2023-10-03 DIAGNOSIS — R2689 Other abnormalities of gait and mobility: Secondary | ICD-10-CM

## 2023-10-03 DIAGNOSIS — M6281 Muscle weakness (generalized): Secondary | ICD-10-CM

## 2023-10-03 NOTE — Therapy (Cosign Needed)
 OUTPATIENT PHYSICAL THERAPY TREATMENT   Patient Name: [name] MRN: [id number] DOB:02/26/1951, 73 y.o., female Today's Date: 10/03/2023   END OF SESSION:  PT End of Session - 10/03/23 1439     Visit Number 6    Number of Visits 9    Date for PT Re-Evaluation 10/16/23    Authorization Type [employer]    PT Start Time 1400    PT Stop Time 1442    PT Time Calculation (min) 42 min    Activity Tolerance Patient tolerated treatment well    Behavior During Therapy [facility] for tasks assessed/performed             Past Medical History:  Diagnosis Date   Anemia    Anxiety    Arthritis    Complication of anesthesia    past hx. laryngospasm following 2 past surgeries, not recent   Depression    Exertional dyspnea 08/04/2019   GERD (gastroesophageal reflux disease)    Hiatal hernia 10/17/2019   Hyperlipidemia    Insomnia    periodically uses med.   Perforated sigmoid colon (HCC) 04/28/2014   during colonoscopy, "rupture colon"became septic, has colostomy and open surgical wound   Pneumonia    Pre-diabetes    Prediabetes 08/09/2017   Primary hypertension 01/05/2023   Suicide attempt (HCC)    Vitamin D deficiency    Past Surgical History:  Procedure Laterality Date   ABDOMINAL HYSTERECTOMY  1999   TAH/BSO   APPLICATION OF WOUND VAC  04/28/2014   Procedure: APPLICATION OF WOUND VAC;  Surgeon: Emelia Loron, MD;  Location: [facility] OR;  Service: General;;   BLEPHAROPLASTY  2010   BREAST SURGERY  2001   Breast reduction   COLOSTOMY N/A 04/28/2014   Procedure: COLOSTOMY;  Surgeon: Emelia Loron, MD;  Location: [facility] OR;  Service: General;  Laterality: N/A;   COLOSTOMY REVISION N/A 04/28/2014   Procedure: COLON RESECTION SIGMOID;  Surgeon: Emelia Loron, MD;  Location: [facility] OR;  Service: General;  Laterality: N/A;   COLOSTOMY TAKEDOWN N/A 09/02/2014   Procedure: LAPAROSCOPIC COLOSTOMY REVERSAL;  Surgeon: Romie Levee, MD;  Location: [facility] ORS;  Service: General;   Laterality: N/A;   COSMETIC SURGERY     DEBRIDEMENT TENNIS ELBOW     DILATION AND CURETTAGE OF UTERUS     x3, hysteroscopy   ELBOW SURGERY  2007   EYE SURGERY     Blepharoplasty   LAPAROTOMY N/A 04/28/2014   Procedure: EXPLORATORY LAPAROTOMY,SIGMOID COLECTOMY;  Surgeon: Emelia Loron, MD;  Location: [facility] OR;  Service: General;  Laterality: N/A;   ORIF WRIST FRACTURE Left 02/19/2021   Procedure: Left wrist open reduction internal fixation and repair as indicated;  Surgeon: Bradly Bienenstock, MD;  Location: [facility] OR;  Service: Orthopedics;  Laterality: Left;    REDUCTION MAMMAPLASTY Bilateral    scar and adhesion repair  06/01/15   and liposuction   TOTAL KNEE ARTHROPLASTY Right 06/27/2022   Procedure: RIGHT TOTAL KNEE ARTHROPLASTY;  Surgeon: Marcene Corning, MD;  Location: [facility] ORS;  Service: Orthopedics;  Laterality: Right;   ULNAR TUNNEL RELEASE Left 2010   Patient Active Problem List   Diagnosis Date Noted   Primary hypertension 01/05/2023   Weakness of both arms 12/24/2022   Weight gain, abnormal 12/24/2022   Decreased energy 12/24/2022   Abdominal bloating 12/24/2022   Unexplained night sweats 12/24/2022   Hyponatremia 12/24/2022   Tachycardia 12/24/2022   Acute cough 12/24/2022   Decreased appetite 12/24/2022   Nonintractable headache 12/24/2022  Enlarged thyroid 12/24/2022   Myalgia due to statin 09/26/2022   Aortic atherosclerosis (HCC) 09/26/2022   S/P total knee arthroplasty, 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   Breast hematoma 04/04/2022   Vitamin D deficiency 12/02/2020   Postmenopausal 12/02/2020   H/O abdominal hysterectomy 12/02/2020   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   Chronic insomnia 12/02/2020   Hiatal hernia 10/17/2019   Exertional dyspnea 08/04/2019   Iron deficiency anemia 08/08/2017   S/P colectomy 04/28/2014   Hyperlipidemia LDL goal <70 07/21/2010   Depression, controlled 07/21/2010   GERD 07/21/2010   History of respiratory system disease 07/21/2010    PCP:   Ronnald Nian, MD Chilton Si, MD  REFERRING PROVIDER: [name], PA-C  REFERRING DIAG:  [id number].[name] (ICD-10-CM) - Fall, initial encounter  R26.89 (ICD-10-CM) - Balance problem   THERAPY DIAG:  Other abnormalities of gait and mobility  Muscle weakness (generalized)  Rationale for Evaluation and Treatment: Rehabilitation  ONSET DATE: Chronic, November 2023   SUBJECTIVE:  SUBJECTIVE STATEMENT: Patient came to PT complaining of knee pain after walking two miles outside today. She feels like she is not feeling great today. She reports being low on energy and feels fatigued.  EVAL: Patient is coming in with c/c of balance issues she states have been occurring since her total knee replacement on R side. Patient complaints of dizziness noted could be due to GI problems and rapid weight loss. Pt reports her doctor is aware of the previous issues and is working with the pt to find a solution. Pt complains of losing balance in tasks like getting down on the floor, putting pants on while standing, standing on surfaces, etc. She states she notices balance getting worse and has to focused on balance to stay stable. Patient wants to be proactive in improving balance issues to be able to continue participation and function in daily life.   PERTINENT HISTORY: TKA in November of 2023 PAIN:  Are you having pain? No  PRECAUTIONS: Fall  RED FLAGS: None   WEIGHT BEARING RESTRICTIONS: No  FALLS:  Has patient fallen in last 6 months? Yes. Number of falls 2 Most falls due to dizziness   LIVING ENVIRONMENT: Lives with: lives alone Lives in: House/apartment Stairs: Yes, internal and external but does not have concern about stairs.  Has following equipment at home: None  OCCUPATION: N/A  PLOF: Independent  PATIENT GOALS: Wants to get back to ADLs, wants to get back to the gym.    OBJECTIVE:  Note: Objective measures were completed at Evaluation unless otherwise  noted PATIENT SURVEYS:  FOTO: 58% functional status 10/03/2023: 58%  COGNITION: Overall cognitive status: Within functional limits for tasks assessed   SENSATION: Not tested  POSTURE: rounded shoulders and forward head  LOWER EXTREMITY ROM:  Active ROM Right eval Left eval  Hip flexion    Hip extension    Hip abduction    Hip adduction    Hip internal rotation    Hip external rotation    Knee flexion    Knee extension    Ankle dorsiflexion    Ankle plantarflexion    Ankle inversion    Ankle eversion     (Blank rows = not tested)  LOWER EXTREMITY MMT:  MMT Right eval Left eval R/L eval  Hip flexion 3+ 3+ 4-/4-  Hip extension     Hip abduction 4 4 4/4  Hip adduction     Hip internal rotation     Hip external rotation     Knee  flexion 5 5   Knee extension 5 5   Ankle dorsiflexion 5 5   Ankle plantarflexion 5 5   Ankle inversion     Ankle eversion      (Blank rows = not tested)   BERG BALANCE TEST Sitting to Standing: 4.      Stands without using hands and stabilize independently Standing Unsupported: 4.      Stands safely for 2 minutes Sitting Unsupported: 4.     Sits for 2 minutes independently Standing to Sitting: 4.     Sits safely with minimal use of hands Transfers: 4.     Transfers safely with minor use of hands Standing with eyes closed: 4.     Stands safely for 10 seconds  Standing with feet together: 4.     Stands for 1 minute safely Reaching forward with outstretched arm: 4.     Reaches forward 10 inches Retrieving object from the floor: 4.      Able to pick up easily and safely Turning to look behind: 4.     Looks behind from both sides and weight shifts well Turning 360 degrees: 3.     Able to turn on one side in </= 4 seconds - Loss of balance Place alternate foot on stool: 4.     Completes 8 steps in 20 seconds     Standing with one foot in front: 4.     Independent tandem for 30 seconds  Standing on one foot: 4.     Holds >10  seconds  Total Score: 55/56  08/30/2023: Functional Gait Assessment: 26/30   GAIT: Dstance walked: 185 ft Assistive device utilized: None Level of assistance: Complete Independence Comments: Pt walked for 2 minutes with minimal loss of balance. Limited ankle dorsiflexion, knee flexion, and hip flexion specifically on the right LE limiting ability to ambulate without tripping. Right foot sits in slight eversion.                                                                                                                TREATMENT TODAY:  10/03/2023  Elliptical L1 R5 x 3 min Floor to chair transfers x10 Sit to stand 2x15 #10 Standing hip flexion 2x10 #5 Obstacle clearance with 4 hurdles x5 laps Airex Beam lateral walks x5 laps  Airex Beam tandeum walks x4  SLS balance in tree pose x2 BL  PATIENT EDUCATION:  Education details: HEP.  Person educated: Patient Education method: Explanation, Demonstration, Verbal cues Education comprehension: verbalized understanding, returned demonstration, verbal cues required, and needs further education  HOME EXERCISE PROGRAM: Access Code: [email or web address]   ASSESSMENT: CLINICAL IMPRESSION Patient presented to clinic today with no adverse effects to therapy. She came into the clinic reporting fatigue and knee pain after walking outside for two miles. We reassessed her goals today and she is improving in her LE strength. Her balance has also improved being able to hold SL tree stance for 8 seconds. Continued to progress balance exercises with obstacle clearance of hurdles added  today. Pt is showing improvements from PT but can continue to attend skilled PT to work on dual tasking, narrow stance, and change of direction for functional activity and community ambulation. Increasing LE strength will additionally help balance deficits.   EVAL: Patient is a 73 y.o. female who was seen today for physical therapy evaluation and treatment for balance deficits.  Strength deficits specially in the hip likely impacting stability. Gait disturbances potentially due to compensations after right TKA noted. Pt performs stable balance with limited difficulty but struggles when movement of UE or head is involved. Patient requires extreme focus to maintain stable balance. Today, FOTO was done indicating a 58% functional status. Hip strengthening was introduced as well as beginning balance training. Patient would benefit from skilled PT in order to improve balance to be able to participate in daily activities without concerns of balance loss.   OBJECTIVE IMPAIRMENTS: Abnormal gait, decreased balance, decreased strength, and dizziness.   ACTIVITY LIMITATIONS: bending, standing, stairs, transfers, and locomotion level  PARTICIPATION LIMITATIONS: community activity  PERSONAL FACTORS: Age, Past/current experiences, and Time since onset of injury/illness/exacerbation are also affecting patient's functional outcome.    GOALS: Goals reviewed with patient? Yes  SHORT TERM GOALS: Target date: 09/18/2023  Patient will be I with initial HEP in order to progress with therapy. Baseline: Goal status: MET  2.  Patient will increase hip flexion strength to a 4/5 to improve gait limitations. Baseline: 3+/5 10/02/2023: 4-/5 Goal status: IN PROGRESS  LONG TERM GOALS: Target date: 10/16/2023  Patient will be independent with final HEP in order to continue treatment at home for pain tolerance.  Baseline:  Goal status: INITIAL  2.  Patient will score at least 65% on FOTO to signify clinically meaningful improvement in functional abilities.  Baseline: 58% 10/03/2023: 58% Goal status: INITIAL  3.  Patient will increase strength to a 5/5 in the LE to improve gait disturbances to allow for increased participation in daily activities.  Baseline: 3+/5 and 4/5 Goal status: INITIAL  4. Will assess DGI to assess dynamic gait disturbances and update goal based on  results..  Baseline: completed DGI and scored a 55/56.   Goal Status: MET  5. Patient will be able to transfer from the floor to a seat with no loss of balance to improve functional ability in the home.   Baseline: not assessed  10/03/2023: able to perform transfer to/from floor  Goal Status: MET   PLAN: PT FREQUENCY: 1x/week  PT DURATION: 8 weeks  PLANNED INTERVENTIONS: 97164- PT Re-evaluation, 97110-Therapeutic exercises, 97530- Therapeutic activity, 97112- Neuromuscular re-education, 97535- Self Care, 16109- Manual therapy, [phone]- Gait training, Patient/Family education, Balance training, Stair training, Joint mobilization, Joint manipulation, Spinal manipulation, Spinal mobilization, Vestibular training, Cryotherapy, and Moist heat  PLAN FOR NEXT SESSION: Review/Revise HEP. Continue to work on lower extremity strength (quad). Work on dual tasking, narrow stance, and directional changes during gait next session. Review goals next session. Floor to chair transfers.   Erin Hearing, Student-PT 10/03/2023, 3:04 PM

## 2023-10-09 ENCOUNTER — Ambulatory Visit: Payer: PPO

## 2023-10-09 DIAGNOSIS — Z0000 Encounter for general adult medical examination without abnormal findings: Secondary | ICD-10-CM

## 2023-10-09 NOTE — Patient Instructions (Signed)
 [name] , Thank you for taking time to come for your Medicare Wellness Visit. I appreciate your ongoing commitment to your health goals. Please review the following plan we discussed and let me know if I can assist you in the future.   Referrals/Orders/Follow-Ups/Clinician Recommendations: none  This is a list of the screening recommended for you and due dates:  Health Maintenance  Topic Date Due   Complete foot exam   Never done   Eye exam for diabetics  Never done   Yearly kidney health urinalysis for diabetes  Never done   Hemoglobin A1C  06/13/2023   COVID-19 Vaccine (7 - 2024-25 season) 09/05/2023   Yearly kidney function blood test for diabetes  05/22/2024   Colon Cancer Screening  08/18/2024   Medicare Annual Wellness Visit  10/08/2024   Mammogram  11/20/2024   DTaP/Tdap/Td vaccine (7 - Td or Tdap) 10/11/2031   Pneumonia Vaccine  Completed   Flu Shot  Completed   DEXA scan (bone density measurement)  Completed   Hepatitis C Screening  Completed   Zoster (Shingles) Vaccine  Completed   HPV Vaccine  Aged Out    Advanced directives: (Copy Requested) Please bring a copy of your health care power of attorney and living will to the office to be added to your chart at your convenience.  Next Medicare Annual Wellness Visit scheduled for next year: Yes  insert Preventive Care attachment Insert FALL PREVENTION attachment if needed

## 2023-10-09 NOTE — Progress Notes (Signed)
 Subjective:   [name] is a 73 y.o. who presents for a Medicare Wellness preventive visit.  Visit Complete: Virtual I connected with  [name] on 10/09/23 by a audio enabled telemedicine application and verified that I am speaking with the correct person using two identifiers.  Patient Location: Home  Provider Location: Office/Clinic  I discussed the limitations of evaluation and management by telemedicine. The patient expressed understanding and agreed to proceed.  Vital Signs: Because this visit was a virtual/telehealth visit, some criteria may be missing or patient reported. Any vitals not documented were not able to be obtained and vitals that have been documented are patient reported.  VideoError- Librarian, academic were attempted between this provider and patient, however failed, due to patient having technical difficulties OR patient did not have access to video capability.  We continued and completed visit with audio only.   AWV Questionnaire: Yes: Patient Medicare AWV questionnaire was completed by the patient on 10/07/2023; I have confirmed that all information answered by patient is correct and no changes since this date.  Cardiac Risk Factors include: advanced age (>37men, >18 women)     Objective:    Today's Vitals   There is no height or weight on file to calculate BMI.     10/09/2023    3:38 PM 08/03/2023    1:54 PM 10/03/2022    3:00 PM 06/27/2022    5:00 PM 06/27/2022    5:47 AM 06/14/2022    2:11 PM 02/14/2021   12:01 PM  Advanced Directives  Does Patient Have a Medical Advance Directive? Yes Yes Yes No Yes Yes Yes  Type of Estate agent of [place];Living will Healthcare Power of [place];Living will Healthcare Power of [place];Living will  Healthcare Power of [place];Living will Living will;Healthcare Power of [employer] Power of [place];Living will  Does patient want to make changes  to medical advance directive?    No - Guardian declined No - Patient declined  No - Guardian declined  Copy of Healthcare Power of Attorney in Chart? No - copy requested  No - copy requested  No - copy requested  No - copy requested  Would patient like information on creating a medical advance directive?    No - Patient declined       Current Medications (verified) Outpatient Encounter Medications as of 10/09/2023  Medication Sig   amLODipine (NORVASC) 5 MG tablet Take 1 tablet (5 mg total) by mouth daily.   buPROPion (WELLBUTRIN XL) 150 MG 24 hr tablet Take 1 tablet (150 mg total) by mouth daily.   buPROPion (WELLBUTRIN XL) 300 MG 24 hr tablet Take 1 tablet (300 mg total) by mouth daily. (Patient taking differently: Take 450 mg by mouth daily.)   eszopiclone (LUNESTA) 2 MG TABS tablet Take 1 tablet (2 mg total) by mouth at bedtime.   Evolocumab (REPATHA SURECLICK) 140 MG/ML SOAJ Inject 140 mg into the skin every 14 (fourteen) days.   gabapentin (NEURONTIN) 600 MG tablet Take 1 tablet (600 mg total) by mouth every evening.   metoprolol succinate (TOPROL-XL) 25 MG 24 hr tablet Take 1 tablet (25 mg total) by mouth daily.   ondansetron (ZOFRAN-ODT) 4 MG disintegrating tablet Dissolve 1-2 tablets under the tongue every 6 hours as needed   pantoprazole (PROTONIX) 40 MG tablet Take 1 tablet (40 mg total) by mouth daily.   pantoprazole (PROTONIX) 40 MG tablet Take 1 tablet (40 mg total) by mouth 2 (two) times  daily.   prazosin (MINIPRESS) 2 MG capsule Take 1 capsule (2 mg total) by mouth at bedtime.   topiramate (TOPAMAX) 25 MG tablet Take 2 tablets (50 mg total) by mouth at bedtime.   Vitamin D, Ergocalciferol, (DRISDOL) 1.25 MG (50000 UNIT) CAPS capsule Take 1 capsule by mouth every 7 days.   DULoxetine (CYMBALTA) 30 MG capsule Take 1 capsule (30 mg total) by mouth daily. (Patient not taking: Reported on 08/15/2023)   prochlorperazine (COMPAZINE) 10 MG tablet Take 1 tablet (10 mg total) by mouth every 8  (eight) hours as needed for nausea or vomiting. (Patient not taking: Reported on 10/09/2023)   No facility-administered encounter medications on file as of 10/09/2023.    Allergies (verified) Bee venom and Statins   History: Past Medical History:  Diagnosis Date   Anemia    Anxiety    Arthritis    Complication of anesthesia    past hx. laryngospasm following 2 past surgeries, not recent   Depression    Exertional dyspnea 08/04/2019   GERD (gastroesophageal reflux disease)    Hiatal hernia 10/17/2019   Hyperlipidemia    Insomnia    periodically uses med.   Perforated sigmoid colon (HCC) 04/28/2014   during colonoscopy, "rupture colon"became septic, has colostomy and open surgical wound   Pneumonia    Pre-diabetes    Prediabetes 08/09/2017   Primary hypertension 01/05/2023   Suicide attempt (HCC)    Vitamin D deficiency    Past Surgical History:  Procedure Laterality Date   ABDOMINAL HYSTERECTOMY  1999   TAH/BSO   APPLICATION OF WOUND VAC  04/28/2014   Procedure: APPLICATION OF WOUND VAC;  Surgeon: Emelia Loron, MD;  Location: [facility] OR;  Service: General;;   BLEPHAROPLASTY  2010   BREAST SURGERY  2001   Breast reduction   COLOSTOMY N/A 04/28/2014   Procedure: COLOSTOMY;  Surgeon: Emelia Loron, MD;  Location: [facility] OR;  Service: General;  Laterality: N/A;   COLOSTOMY REVISION N/A 04/28/2014   Procedure: COLON RESECTION SIGMOID;  Surgeon: Emelia Loron, MD;  Location: [facility] OR;  Service: General;  Laterality: N/A;   COLOSTOMY TAKEDOWN N/A 09/02/2014   Procedure: LAPAROSCOPIC COLOSTOMY REVERSAL;  Surgeon: Romie Levee, MD;  Location: [facility] ORS;  Service: General;  Laterality: N/A;   COSMETIC SURGERY     DEBRIDEMENT TENNIS ELBOW     DILATION AND CURETTAGE OF UTERUS     x3, hysteroscopy   ELBOW SURGERY  2007   EYE SURGERY     Blepharoplasty   LAPAROTOMY N/A 04/28/2014   Procedure: EXPLORATORY LAPAROTOMY,SIGMOID COLECTOMY;  Surgeon: Emelia Loron, MD;  Location: [facility] OR;   Service: General;  Laterality: N/A;   ORIF WRIST FRACTURE Left 02/19/2021   Procedure: Left wrist open reduction internal fixation and repair as indicated;  Surgeon: Bradly Bienenstock, MD;  Location: [facility] OR;  Service: Orthopedics;  Laterality: Left;    REDUCTION MAMMAPLASTY Bilateral    scar and adhesion repair  06/01/15   and liposuction   TOTAL KNEE ARTHROPLASTY Right 06/27/2022   Procedure: RIGHT TOTAL KNEE ARTHROPLASTY;  Surgeon: Marcene Corning, MD;  Location: [facility] ORS;  Service: Orthopedics;  Laterality: Right;   ULNAR TUNNEL RELEASE Left 2010   Family History  Problem Relation Age of Onset   Hypertension Mother    Diabetes Mother    Dementia Father    Hyperlipidemia Father    Hypertension Father    Diabetes Father    Cancer Father        History of  bladder cancer   Heart disease Father    Other Father        4 ft intestinal removed-had gangrene in gallbladder, then spread to intestines   Heart attack Father    Esophageal cancer Sister    Hyperlipidemia Brother    Heart disease Maternal Grandmother    Stroke Maternal Grandfather    Cancer Paternal Grandmother        uterine   Cancer Paternal Grandfather        pancreatic cancer   Parkinsonism Maternal Uncle    Colon cancer Neg Hx    Social History   Socioeconomic History   Marital status: Divorced    Spouse name: Not on file   Number of children: 2   Years of education: Not on file   Highest education level: Not on file  Occupational History   Occupation: retired  Tobacco Use   Smoking status: Former    Current packs/day: 0.00    Types: Cigarettes    Quit date: 09/01/2003    Years since quitting: 20.1   Smokeless tobacco: Never   Tobacco comments:    occasional  Vaping Use   Vaping status: Every Day   Substances: Nicotine, Nicotine-salt  Substance and Sexual Activity   Alcohol use: Yes    Alcohol/week: 14.0 standard drinks of alcohol    Types: 14 Cans of beer per week    Comment: occas.   Drug use: Not  Currently    Types: Marijuana   Sexual activity: Not Currently    Birth control/protection: Surgical    Comment: TAH/BSO  Other Topics Concern   Not on file  Social History Narrative   College Degree   Registered Nurse   Exercises   Left handed                                                               Social Drivers of Health   Financial Resource Strain: Low Risk  (10/09/2023)   Overall Financial Resource Strain (CARDIA)    Difficulty of Paying Living Expenses: Not hard at all  Food Insecurity: No Food Insecurity (10/09/2023)   Hunger Vital Sign    Worried About Running Out of Food in the Last Year: Never true    Ran Out of Food in the Last Year: Never true  Transportation Needs: No Transportation Needs (10/09/2023)   PRAPARE - Administrator, Civil Service (Medical): No    Lack of Transportation (Non-Medical): No  Physical Activity: Sufficiently Active (10/09/2023)   Exercise Vital Sign    Days of Exercise per Week: 3 days    Minutes of Exercise per Session: 50 min  Stress: Stress Concern Present (10/09/2023)   [employer] of Occupational Health - Occupational Stress Questionnaire    Feeling of Stress : To some extent  Social Connections: Moderately Isolated (10/09/2023)   Social Connection and Isolation Panel [NHANES]    Frequency of Communication with Friends and Family: More than three times a week    Frequency of Social Gatherings with Friends and Family: Once a week    Attends Religious Services: Never    Database administrator or Organizations: No    Attends Banker Meetings: Never    Marital Status: Married    Tobacco  Counseling Counseling given: Not Answered Tobacco comments: occasional    Clinical Intake:  Pre-visit preparation completed: Yes  Pain : No/denies pain     Nutritional Risks: None Diabetes: No  How often do you need to have someone help you when you read instructions, pamphlets, or other  written materials from your doctor or pharmacy?: 1 - Never  Interpreter Needed?: No  Information entered by :: NAllen LPN   Activities of Daily Living     10/07/2023   11:01 AM  In your present state of health, do you have any difficulty performing the following activities:  Hearing? 0  Vision? 0  Difficulty concentrating or making decisions? 0  Walking or climbing stairs? 0  Dressing or bathing? 0  Doing errands, shopping? 0  Preparing Food and eating ? N  Using the Toilet? N  In the past six months, have you accidently leaked urine? N  Do you have problems with loss of bowel control? N  Managing your Medications? N  Managing your Finances? N  Housekeeping or managing your Housekeeping? N    Patient Care Team: Ronnald Nian, MD as PCP - General (Family Medicine) Chilton Si, MD as PCP - Cardiology (Cardiology) Jerene Bears, MD as Consulting Physician (Gynecology) Dominica Severin, MD as Consulting Physician (Orthopedic Surgery) Drema [place], DO as Consulting Physician (Neurology)  Indicate any recent Medical Services you may have received from other than [facility] providers in the past year (date may be approximate).     Assessment:   This is a routine wellness examination for [place].  Hearing/Vision screen Hearing Screening - Comments:: Denies hearing issues Vision Screening - Comments:: Regular eye exams, Dr. Charlotte Sanes   Goals Addressed             This Visit's Progress    Patient Stated       10/09/2023, working on balance       Depression Screen     10/09/2023    3:41 PM 10/03/2022    3:02 PM 05/08/2022   11:31 AM 04/03/2022    4:14 PM 03/02/2022    2:16 PM 04/21/2019    1:53 PM 08/08/2017    4:41 PM  PHQ 2/9 Scores  PHQ - 2 Score 6 3 0 0 0 0   PHQ- 9 Score 12 6          Information is confidential and restricted. Go to Review Flowsheets to unlock data.    Fall Risk     10/07/2023   11:01 AM 08/03/2023    1:54 PM 10/03/2022    3:01 PM 08/24/2022     1:46 PM 08/08/2017    3:21 PM  Fall Risk   Falls in the past year? 0 0 1 1   Comment   knee gave out    Number falls in past yr: 0 0 1 1   Injury with Fall? 0 0 1 1   Comment   broke some teeth    Risk for fall due to : Medication side effect;Impaired balance/gait  Medication side effect History of fall(s)   Follow up Falls prevention discussed;Falls evaluation completed Falls evaluation completed Falls prevention discussed;Education provided;Falls evaluation completed Falls evaluation completed;Falls prevention discussed      Information is confidential and restricted. Go to Review Flowsheets to unlock data.    MEDICARE RISK AT HOME:  Medicare Risk at Home Any stairs in or around the home?: (Patient-Rptd) Yes If so, are there any without handrails?: (Patient-Rptd) No Home  free of loose throw rugs in walkways, pet beds, electrical cords, etc?: (Patient-Rptd) Yes Adequate lighting in your home to reduce risk of falls?: (Patient-Rptd) Yes Life alert?: (Patient-Rptd) No Use of a cane, walker or w/c?: (Patient-Rptd) No Grab bars in the bathroom?: (Patient-Rptd) No Shower chair or bench in shower?: (Patient-Rptd) Yes Elevated toilet seat or a handicapped toilet?: (Patient-Rptd) Yes  TIMED UP AND GO:  Was the test performed?  No  Cognitive Function: 6CIT completed        10/09/2023    3:51 PM 10/03/2022    3:09 PM  6CIT Screen  What Year? 0 points 0 points  What month? 0 points 0 points  What time? 0 points 0 points  Count back from 20 0 points 0 points  Months in reverse 0 points 2 points  Repeat phrase 0 points 0 points  Total Score 0 points 2 points    Immunizations Immunization History  Administered Date(s) Administered   Fluad Quad(high Dose 65+) 04/26/2021, 08/24/2022   Fluad Trivalent(High Dose 65+) 07/11/2023   Influenza,inj,Quad PF,6+ Mos 04/14/2014   MMR 02/06/2008   PFIZER(Purple Top)SARS-COV-2 Vaccination 08/06/2019, 08/27/2019, 05/15/2020   Pfizer Covid-19  Vaccine Bivalent Booster 63yrs & up 05/09/2021   Pfizer(Comirnaty)Fall Seasonal Vaccine 12 years and older 05/29/2022, 07/11/2023   Pneumococcal Conjugate-13 08/08/2017   Pneumococcal Polysaccharide-23 04/21/2019   Td 08/08/2007, 07/22/2008   Tdap 01/02/2008, 08/08/2017, 06/02/2018, 10/10/2021   Zoster Recombinant(Shingrix) 01/11/2018, 03/25/2018    Screening Tests Health Maintenance  Topic Date Due   FOOT EXAM  Never done   OPHTHALMOLOGY EXAM  Never done   Diabetic kidney evaluation - Urine ACR  Never done   HEMOGLOBIN A1C  06/13/2023   COVID-19 Vaccine (7 - 2024-25 season) 09/05/2023   Diabetic kidney evaluation - eGFR measurement  05/22/2024   Colonoscopy  08/18/2024   Medicare Annual Wellness (AWV)  10/08/2024   MAMMOGRAM  11/20/2024   DTaP/Tdap/Td (7 - Td or Tdap) 10/11/2031   Pneumonia Vaccine 54+ Years old  Completed   INFLUENZA VACCINE  Completed   DEXA SCAN  Completed   Hepatitis C Screening  Completed   Zoster Vaccines- Shingrix  Completed   HPV VACCINES  Aged Out    Health Maintenance  Health Maintenance Due  Topic Date Due   FOOT EXAM  Never done   OPHTHALMOLOGY EXAM  Never done   Diabetic kidney evaluation - Urine ACR  Never done   HEMOGLOBIN A1C  06/13/2023   COVID-19 Vaccine (7 - 2024-25 season) 09/05/2023   Health Maintenance Items Addressed: Patient does not have a diagnosis of diabetes.  Additional Screening:  Vision Screening: Recommended annual ophthalmology exams for early detection of glaucoma and other disorders of the eye.  Dental Screening: Recommended annual dental exams for proper oral hygiene  Community Resource Referral / Chronic Care Management: CRR required this visit?  No   CCM required this visit?  No     Plan:     I have personally reviewed and noted the following in the patient's chart:   Medical and social history Use of alcohol, tobacco or illicit drugs  Current medications and supplements including opioid  prescriptions. Patient is not currently taking opioid prescriptions. Functional ability and status Nutritional status Physical activity Advanced directives List of other physicians Hospitalizations, surgeries, and ER visits in previous 12 months Vitals Screenings to include cognitive, depression, and falls Referrals and appointments  In addition, I have reviewed and discussed with patient certain preventive protocols, quality metrics, and best  practice recommendations. A written personalized care plan for preventive services as well as general preventive health recommendations were provided to patient.     Barb Merino, LPN   08/12/1094   After Visit Summary: (MyChart) Due to this being a telephonic visit, the after visit summary with patients personalized plan was offered to patient via MyChart   Notes: Nothing significant to report at this time.

## 2023-10-09 NOTE — Therapy (Signed)
 OUTPATIENT PHYSICAL THERAPY TREATMENT   Patient Name: [name] MRN: [id number] DOB:[birth year], 73 y.o., female Today's Date: 10/10/2023   END OF SESSION:  PT End of Session - 10/10/23 1315     Visit Number 7    Number of Visits 9    Date for PT Re-Evaluation 10/16/23    Authorization Type [employer]    PT Start Time 1315    PT Stop Time 1356    PT Time Calculation (min) 41 min    Activity Tolerance Patient tolerated treatment well              Past Medical History:  Diagnosis Date   Anemia    Anxiety    Arthritis    Complication of anesthesia    past hx. laryngospasm following 2 past surgeries, not recent   Depression    Exertional dyspnea 08/04/2019   GERD (gastroesophageal reflux disease)    Hiatal hernia 10/17/2019   Hyperlipidemia    Insomnia    periodically uses med.   Perforated sigmoid colon (HCC) 04/28/2014   during colonoscopy, "rupture colon"became septic, has colostomy and open surgical wound   Pneumonia    Pre-diabetes    Prediabetes 08/09/2017   Primary hypertension 01/05/2023   Suicide attempt (HCC)    Vitamin D deficiency    Past Surgical History:  Procedure Laterality Date   ABDOMINAL HYSTERECTOMY  1999   TAH/BSO   APPLICATION OF WOUND VAC  04/28/2014   Procedure: APPLICATION OF WOUND VAC;  Surgeon: Emelia Loron, MD;  Location: [facility] OR;  Service: General;;   BLEPHAROPLASTY  2010   BREAST SURGERY  2001   Breast reduction   COLOSTOMY N/A 04/28/2014   Procedure: COLOSTOMY;  Surgeon: Emelia Loron, MD;  Location: [facility] OR;  Service: General;  Laterality: N/A;   COLOSTOMY REVISION N/A 04/28/2014   Procedure: COLON RESECTION SIGMOID;  Surgeon: Emelia Loron, MD;  Location: [facility] OR;  Service: General;  Laterality: N/A;   COLOSTOMY TAKEDOWN N/A 09/02/2014   Procedure: LAPAROSCOPIC COLOSTOMY REVERSAL;  Surgeon: Romie Levee, MD;  Location: [facility] ORS;  Service: General;  Laterality: N/A;   COSMETIC SURGERY     DEBRIDEMENT  TENNIS ELBOW     DILATION AND CURETTAGE OF UTERUS     x3, hysteroscopy   ELBOW SURGERY  2007   EYE SURGERY     Blepharoplasty   LAPAROTOMY N/A 04/28/2014   Procedure: EXPLORATORY LAPAROTOMY,SIGMOID COLECTOMY;  Surgeon: Emelia Loron, MD;  Location: [facility] OR;  Service: General;  Laterality: N/A;   ORIF WRIST FRACTURE Left 02/19/2021   Procedure: Left wrist open reduction internal fixation and repair as indicated;  Surgeon: Bradly Bienenstock, MD;  Location: [facility] OR;  Service: Orthopedics;  Laterality: Left;    REDUCTION MAMMAPLASTY Bilateral    scar and adhesion repair  06/01/15   and liposuction   TOTAL KNEE ARTHROPLASTY Right 06/27/2022   Procedure: RIGHT TOTAL KNEE ARTHROPLASTY;  Surgeon: Marcene Corning, MD;  Location: [facility] ORS;  Service: Orthopedics;  Laterality: Right;   ULNAR TUNNEL RELEASE Left 2010   Patient Active Problem List   Diagnosis Date Noted   Primary hypertension 01/05/2023   Weakness of both arms 12/24/2022   Weight gain, abnormal 12/24/2022   Decreased energy 12/24/2022   Abdominal bloating 12/24/2022   Unexplained night sweats 12/24/2022   Hyponatremia 12/24/2022   Tachycardia 12/24/2022   Acute cough 12/24/2022   Decreased appetite 12/24/2022   Nonintractable headache 12/24/2022   Enlarged thyroid 12/24/2022   Myalgia due to  statin 09/26/2022   Aortic atherosclerosis (HCC) 09/26/2022   S/P total knee arthroplasty, 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   Breast hematoma 04/04/2022   Vitamin D deficiency 12/02/2020   Postmenopausal 12/02/2020   H/O abdominal hysterectomy 12/02/2020   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   Chronic insomnia 12/02/2020   Hiatal hernia 10/17/2019   Exertional dyspnea 08/04/2019   Iron deficiency anemia 08/08/2017   S/P colectomy 04/28/2014   Hyperlipidemia LDL goal <70 07/21/2010   Depression, controlled 07/21/2010   GERD 07/21/2010   History of respiratory system disease 07/21/2010    PCP:  Ronnald Nian, MD Chilton Si, MD  REFERRING  PROVIDER: [name], PA-C  REFERRING DIAG:  [phone].[name] (ICD-10-CM) - Fall, initial encounter  R26.89 (ICD-10-CM) - Balance problem   THERAPY DIAG:  Other abnormalities of gait and mobility  Muscle weakness (generalized)  Rationale for Evaluation and Treatment: Rehabilitation  ONSET DATE: Chronic, November 2023   SUBJECTIVE:  SUBJECTIVE STATEMENT: 10/10/2023 Pt states she is feeling "fine" today. Felt a bit fatigued after last session but also notes she had done a lot of walking and had gone to gym.   EVAL: Patient is coming in with c/c of balance issues she states have been occurring since her total knee replacement on R side. Patient complaints of dizziness noted could be due to GI problems and rapid weight loss. Pt reports her doctor is aware of the previous issues and is working with the pt to find a solution. Pt complains of losing balance in tasks like getting down on the floor, putting pants on while standing, standing on surfaces, etc. She states she notices balance getting worse and has to focused on balance to stay stable. Patient wants to be proactive in improving balance issues to be able to continue participation and function in daily life.   PERTINENT HISTORY: TKA in November of 2023 PAIN:  Are you having pain? No  PRECAUTIONS: Fall  RED FLAGS: None   WEIGHT BEARING RESTRICTIONS: No  FALLS:  Has patient fallen in last 6 months? Yes. Number of falls 2 Most falls due to dizziness   LIVING ENVIRONMENT: Lives with: lives alone Lives in: House/apartment Stairs: Yes, internal and external but does not have concern about stairs.  Has following equipment at home: None  OCCUPATION: N/A  PLOF: Independent  PATIENT GOALS: Wants to get back to ADLs, wants to get back to the gym.    OBJECTIVE:  Note: Objective measures were completed at Evaluation unless otherwise noted PATIENT SURVEYS:  FOTO: 58% functional status 10/03/2023: 58%  COGNITION: Overall  cognitive status: Within functional limits for tasks assessed   SENSATION: Not tested  POSTURE: rounded shoulders and forward head  LOWER EXTREMITY ROM:  Active ROM Right eval Left eval  Hip flexion    Hip extension    Hip abduction    Hip adduction    Hip internal rotation    Hip external rotation    Knee flexion    Knee extension    Ankle dorsiflexion    Ankle plantarflexion    Ankle inversion    Ankle eversion     (Blank rows = not tested)  LOWER EXTREMITY MMT:  MMT Right eval Left eval R/L eval  Hip flexion 3+ 3+ 4-/4-  Hip extension     Hip abduction 4 4 4/4  Hip adduction     Hip internal rotation     Hip external rotation     Knee flexion 5 5   Knee extension 5 5  Ankle dorsiflexion 5 5   Ankle plantarflexion 5 5   Ankle inversion     Ankle eversion      (Blank rows = not tested)   BERG BALANCE TEST Sitting to Standing: 4.      Stands without using hands and stabilize independently Standing Unsupported: 4.      Stands safely for 2 minutes Sitting Unsupported: 4.     Sits for 2 minutes independently Standing to Sitting: 4.     Sits safely with minimal use of hands Transfers: 4.     Transfers safely with minor use of hands Standing with eyes closed: 4.     Stands safely for 10 seconds  Standing with feet together: 4.     Stands for 1 minute safely Reaching forward with outstretched arm: 4.     Reaches forward 10 inches Retrieving object from the floor: 4.      Able to pick up easily and safely Turning to look behind: 4.     Looks behind from both sides and weight shifts well Turning 360 degrees: 3.     Able to turn on one side in </= 4 seconds - Loss of balance Place alternate foot on stool: 4.     Completes 8 steps in 20 seconds     Standing with one foot in front: 4.     Independent tandem for 30 seconds  Standing on one foot: 4.     Holds >10 seconds  Total Score: 55/56  08/30/2023: Functional Gait Assessment: 26/30   GAIT: Dstance walked:  185 ft Assistive device utilized: None Level of assistance: Complete Independence Comments: Pt walked for 2 minutes with minimal loss of balance. Limited ankle dorsiflexion, knee flexion, and hip flexion specifically on the right LE limiting ability to ambulate without tripping. Right foot sits in slight eversion.                                                                                                                TREATMENT TODAY:  [facility] Adult PT Treatment:                                                DATE: 10/10/23 Therapeutic Exercise: Elliptical 4 min during subjective  Mini squat 2x12 for LE endurance Fwd lunge x5 BIL   Neuromuscular re-ed: Tandem walk on airex beam 3 laps CGA-minA Tandem walk barefoot 2 laps barefoot CGA Karaoke 5 laps at counter, gradually increasing velocity  Runners step up x10 BIL 6 inch step CGA  Posterior trunk weight shifts x8 PT assisted to work on righting reactions    10/03/2023  Elliptical L1 R5 x 3 min Floor to chair transfers x10 Sit to stand 2x15 #10 Standing hip flexion 2x10 #5 Obstacle clearance with 4 hurdles x5 laps Airex Beam lateral walks x5 laps  Airex Beam tandeum walks x4  SLS balance  in tree pose x2 BL  PATIENT EDUCATION:  Education details: HEP.  Person educated: Patient Education method: Explanation, Demonstration, Verbal cues Education comprehension: verbalized understanding, returned demonstration, verbal cues required, and needs further education  HOME EXERCISE PROGRAM: Access Code: [email or web address]   ASSESSMENT: CLINICAL IMPRESSION Pt arrives w/ report of no issues after last session. Today continuing to progress LE endurance activity, multiplanar postural stability and balance on compliant surfaces. Does require CGA-minA with more challenging activities but tendency to improve with repetition and cues for visual fixation. No adverse events, tolerates well with mild muscular fatigue as expected. Recommend continuing along  current POC in order to address relevant deficits and improve functional tolerance. Pt departs today's session in no acute distress, all voiced questions/concerns addressed appropriately from PT perspective.     EVAL: Patient is a 73 y.o. female who was seen today for physical therapy evaluation and treatment for balance deficits. Strength deficits specially in the hip likely impacting stability. Gait disturbances potentially due to compensations after right TKA noted. Pt performs stable balance with limited difficulty but struggles when movement of UE or head is involved. Patient requires extreme focus to maintain stable balance. Today, FOTO was done indicating a 58% functional status. Hip strengthening was introduced as well as beginning balance training. Patient would benefit from skilled PT in order to improve balance to be able to participate in daily activities without concerns of balance loss.   OBJECTIVE IMPAIRMENTS: Abnormal gait, decreased balance, decreased strength, and dizziness.   ACTIVITY LIMITATIONS: bending, standing, stairs, transfers, and locomotion level  PARTICIPATION LIMITATIONS: community activity  PERSONAL FACTORS: Age, Past/current experiences, and Time since onset of injury/illness/exacerbation are also affecting patient's functional outcome.    GOALS: Goals reviewed with patient? Yes  SHORT TERM GOALS: Target date: 09/18/2023  Patient will be I with initial HEP in order to progress with therapy. Baseline: Goal status: MET  2.  Patient will increase hip flexion strength to a 4/5 to improve gait limitations. Baseline: 3+/5 10/02/2023: 4-/5 Goal status: IN PROGRESS  LONG TERM GOALS: Target date: 10/16/2023  Patient will be independent with final HEP in order to continue treatment at home for pain tolerance.  Baseline:  Goal status: INITIAL  2.  Patient will score at least 65% on FOTO to signify clinically meaningful improvement in functional abilities.   Baseline: 58% 10/03/2023: 58% Goal status: INITIAL  3.  Patient will increase strength to a 5/5 in the LE to improve gait disturbances to allow for increased participation in daily activities.  Baseline: 3+/5 and 4/5 Goal status: INITIAL  4. Will assess DGI to assess dynamic gait disturbances and update goal based on results..  Baseline: completed DGI and scored a 55/56.   Goal Status: MET  5. Patient will be able to transfer from the floor to a seat with no loss of balance to improve functional ability in the home.   Baseline: not assessed  10/03/2023: able to perform transfer to/from floor  Goal Status: MET   PLAN: PT FREQUENCY: 1x/week  PT DURATION: 8 weeks  PLANNED INTERVENTIONS: 97164- PT Re-evaluation, 97110-Therapeutic exercises, 97530- Therapeutic activity, 97112- Neuromuscular re-education, 97535- Self Care, 54098- Manual therapy, [phone]- Gait training, Patient/Family education, Balance training, Stair training, Joint mobilization, Joint manipulation, Spinal manipulation, Spinal mobilization, Vestibular training, Cryotherapy, and Moist heat  PLAN FOR NEXT SESSION: Review/Revise HEP. Continue to work on lower extremity strength (quad). Work on dual tasking, narrow stance, and directional changes during gait next session. Review goals next session. Floor to  chair transfers.   Ashley Murrain PT, DPT 10/10/2023 2:00 PM

## 2023-10-10 ENCOUNTER — Encounter: Payer: Self-pay | Admitting: Physical Therapy

## 2023-10-10 ENCOUNTER — Ambulatory Visit: Payer: PPO | Attending: Medical | Admitting: Physical Therapy

## 2023-10-10 ENCOUNTER — Ambulatory Visit: Payer: PPO | Admitting: Physical Therapy

## 2023-10-10 DIAGNOSIS — R2689 Other abnormalities of gait and mobility: Secondary | ICD-10-CM | POA: Insufficient documentation

## 2023-10-10 DIAGNOSIS — M6281 Muscle weakness (generalized): Secondary | ICD-10-CM | POA: Insufficient documentation

## 2023-10-16 ENCOUNTER — Encounter: Payer: Self-pay | Admitting: Family Medicine

## 2023-10-16 ENCOUNTER — Ambulatory Visit (INDEPENDENT_AMBULATORY_CARE_PROVIDER_SITE_OTHER): Payer: PPO | Admitting: Family Medicine

## 2023-10-16 ENCOUNTER — Other Ambulatory Visit (HOSPITAL_BASED_OUTPATIENT_CLINIC_OR_DEPARTMENT_OTHER): Payer: Self-pay

## 2023-10-16 VITALS — BP 118/62 | HR 78 | Ht 65.5 in | Wt 145.0 lb

## 2023-10-16 DIAGNOSIS — K219 Gastro-esophageal reflux disease without esophagitis: Secondary | ICD-10-CM

## 2023-10-16 DIAGNOSIS — E739 Lactose intolerance, unspecified: Secondary | ICD-10-CM | POA: Diagnosis not present

## 2023-10-16 DIAGNOSIS — G478 Other sleep disorders: Secondary | ICD-10-CM

## 2023-10-16 DIAGNOSIS — F32A Depression, unspecified: Secondary | ICD-10-CM | POA: Diagnosis not present

## 2023-10-16 DIAGNOSIS — M791 Myalgia, unspecified site: Secondary | ICD-10-CM

## 2023-10-16 DIAGNOSIS — I10 Essential (primary) hypertension: Secondary | ICD-10-CM | POA: Diagnosis not present

## 2023-10-16 DIAGNOSIS — E785 Hyperlipidemia, unspecified: Secondary | ICD-10-CM | POA: Diagnosis not present

## 2023-10-16 DIAGNOSIS — Z0000 Encounter for general adult medical examination without abnormal findings: Secondary | ICD-10-CM

## 2023-10-16 DIAGNOSIS — Z96651 Presence of right artificial knee joint: Secondary | ICD-10-CM

## 2023-10-16 DIAGNOSIS — G43009 Migraine without aura, not intractable, without status migrainosus: Secondary | ICD-10-CM

## 2023-10-16 DIAGNOSIS — I700 Atherosclerosis of aorta: Secondary | ICD-10-CM | POA: Diagnosis not present

## 2023-10-16 DIAGNOSIS — Z9049 Acquired absence of other specified parts of digestive tract: Secondary | ICD-10-CM

## 2023-10-16 DIAGNOSIS — T466X5A Adverse effect of antihyperlipidemic and antiarteriosclerotic drugs, initial encounter: Secondary | ICD-10-CM

## 2023-10-16 DIAGNOSIS — E049 Nontoxic goiter, unspecified: Secondary | ICD-10-CM

## 2023-10-16 MED ORDER — METOPROLOL SUCCINATE ER 25 MG PO TB24
25.0000 mg | ORAL_TABLET | Freq: Every day | ORAL | 3 refills | Status: DC
  Filled 2023-10-16: qty 90, 90d supply, fill #0

## 2023-10-16 NOTE — Progress Notes (Signed)
 Complete physical exam  Patient: [name]   DOB: 04-07-1951   73 y.o. Female  MRN: [id number]  Subjective:    Chief Complaint  Patient presents with   Annual Exam    Annual exam, had AWV last week with nurse. Would like to know if she can stop her bp medication. It was from a one time event, UC started her on these.     [name] is a 73 y.o. female who presents today for a complete physical exam.  She reports consuming a general diet.  Exercises 3-4 times a week.  She generally feels fairly well but then admits to being depressed.. She reports sleeping well.  She is using Lunesta.  She is being followed by Yvette Rack.  She also has seen Dr. Jennelle Human in the past.  She is taking Neurontin given to her by Dr. Everlena Cooper for her migraines.  Continues on Protonix for her reflux type symptoms.  She does have a history of lactose intolerance.  Continues on amlodipine.  She also is taking metoprolol .  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  She continues on Repatha.  Most recent fall risk assessment:    10/07/2023   11:01 AM  Fall Risk   Falls in the past year? 0  Number falls in past yr: 0  Injury with Fall? 0  Risk for fall due to : Medication side effect;Impaired balance/gait  Follow up Falls prevention discussed;Falls evaluation completed     Most recent depression screenings:    10/09/2023    3:41 PM 10/03/2022    3:02 PM  PHQ 2/9 Scores  PHQ - 2 Score 6 3  PHQ- 9 Score 12 6    Vision:    Immunization History  Administered Date(s) Administered   Fluad Quad(high Dose 65+) 04/26/2021, 08/24/2022   Fluad Trivalent(High Dose 65+) 07/11/2023   Influenza,inj,Quad PF,6+ Mos 04/14/2014   MMR 02/06/2008   PFIZER(Purple Top)SARS-COV-2 Vaccination 08/06/2019, 08/27/2019, 05/15/2020   Pfizer Covid-19 Vaccine Bivalent Booster 50yrs & up 05/09/2021   Pfizer(Comirnaty)Fall Seasonal Vaccine 12 years and older 05/29/2022,  07/11/2023   Pneumococcal Conjugate-13 08/08/2017   Pneumococcal Polysaccharide-23 04/21/2019   Td 08/08/2007, 07/22/2008   Tdap 01/02/2008, 08/08/2017, 06/02/2018, 10/10/2021   Zoster Recombinant(Shingrix) 01/11/2018, 03/25/2018    Health Maintenance  Topic Date Due   FOOT EXAM  Never done   OPHTHALMOLOGY EXAM  Never done   Diabetic kidney evaluation - Urine ACR  Never done   HEMOGLOBIN A1C  06/13/2023   COVID-19 Vaccine (7 - 2024-25 season) 11/01/2023 (Originally 09/05/2023)   Diabetic kidney evaluation - eGFR measurement  05/22/2024   Colonoscopy  08/18/2024   Medicare Annual Wellness (AWV)  10/08/2024   MAMMOGRAM  11/20/2024   DTaP/Tdap/Td (7 - Td or Tdap) 10/11/2031   Pneumonia Vaccine 28+ Years old  Completed   INFLUENZA VACCINE  Completed   DEXA SCAN  Completed   Hepatitis C Screening  Completed   Zoster Vaccines- Shingrix  Completed   HPV VACCINES  Aged Out    Patient Care Team: Ronnald Nian, MD as PCP - General (Family Medicine) Chilton Si, MD as PCP - Cardiology (Cardiology) Jerene Bears, MD as Consulting Physician (Gynecology) Dominica Severin, MD as Consulting Physician (Orthopedic Surgery) Drema Dallas, DO as Consulting Physician (Neurology)   Outpatient Medications Prior to Visit  Medication Sig Note   amLODipine (NORVASC) 5 MG tablet Take 1 tablet (5 mg  total) by mouth daily.    buPROPion (WELLBUTRIN XL) 150 MG 24 hr tablet Take 1 tablet (150 mg total) by mouth daily.    buPROPion (WELLBUTRIN XL) 300 MG 24 hr tablet Take 1 tablet (300 mg total) by mouth daily. (Patient taking differently: Take 450 mg by mouth daily.)    eszopiclone (LUNESTA) 2 MG TABS tablet Take 1 tablet (2 mg total) by mouth at bedtime.    Evolocumab (REPATHA SURECLICK) 140 MG/ML SOAJ Inject 140 mg into the skin every 14 (fourteen) days.    gabapentin (NEURONTIN) 600 MG tablet Take 1 tablet (600 mg total) by mouth every evening.    metoprolol succinate (TOPROL-XL) 25 MG 24 hr  tablet Take 1 tablet (25 mg total) by mouth daily.    pantoprazole (PROTONIX) 40 MG tablet Take 1 tablet (40 mg total) by mouth 2 (two) times daily.    prazosin (MINIPRESS) 2 MG capsule Take 1 capsule (2 mg total) by mouth at bedtime.    topiramate (TOPAMAX) 25 MG tablet Take 2 tablets (50 mg total) by mouth at bedtime.    Vitamin D, Ergocalciferol, (DRISDOL) 1.25 MG (50000 UNIT) CAPS capsule Take 1 capsule by mouth every 7 days.    [DISCONTINUED] pantoprazole (PROTONIX) 40 MG tablet Take 1 tablet (40 mg total) by mouth daily.    ondansetron (ZOFRAN-ODT) 4 MG disintegrating tablet Dissolve 1-2 tablets under the tongue every 6 hours as needed (Patient not taking: Reported on 10/16/2023) 10/16/2023: As needed   prochlorperazine (COMPAZINE) 10 MG tablet Take 1 tablet (10 mg total) by mouth every 8 (eight) hours as needed for nausea or vomiting. (Patient not taking: Reported on 10/16/2023) 10/16/2023: Ran out   [DISCONTINUED] DULoxetine (CYMBALTA) 30 MG capsule Take 1 capsule (30 mg total) by mouth daily. (Patient not taking: Reported on 08/15/2023)    No facility-administered medications prior to visit.    Review of Systems  All other systems reviewed and are negative.   Family and social history as well as health maintenance and immunizations was reviewed.     Objective:    BP 118/62   Pulse 78   Ht 5' 5.5" (1.664 m)   Wt 145 lb (65.8 kg)   LMP 08/07/1998   BMI 23.76 kg/m    Physical Exam   Alert and in no distress. Tympanic membranes and canals are normal. Pharyngeal area is normal. Neck is supple without adenopathy or thyromegaly. Cardiac exam shows a regular sinus rhythm without murmurs or gallops. Lungs are clear to auscultation.     Assessment & Plan:     Routine general medical examination at a health care facility  Depression, controlled  Hyperlipidemia LDL goal <70 - Plan: Lipid panel  Myalgia due to statin  Primary hypertension  S/P colectomy  S/P total knee  arthroplasty, right  Aortic atherosclerosis (HCC)  Gastroesophageal reflux disease without esophagitis  Sleep paralysis  Lactose intolerance  She is a very complex patient.  She sees various specialist to help with her issues.  She knows to avoid milk and milk products.  She will continue to be followed by psychiatry as well as neurology, GI.  She does plan to talk to her therapist concerning the use of Lunesta and prazosin.  Did recommend she get the RSV vaccine. Return in about 1 year (around 10/15/2024).      Sharlot Gowda, MD

## 2023-10-17 ENCOUNTER — Ambulatory Visit: Payer: PPO | Admitting: Physical Therapy

## 2023-10-17 ENCOUNTER — Encounter: Payer: Self-pay | Admitting: Physical Therapy

## 2023-10-17 ENCOUNTER — Other Ambulatory Visit: Payer: Self-pay

## 2023-10-17 DIAGNOSIS — R2689 Other abnormalities of gait and mobility: Secondary | ICD-10-CM | POA: Diagnosis not present

## 2023-10-17 DIAGNOSIS — M6281 Muscle weakness (generalized): Secondary | ICD-10-CM

## 2023-10-17 LAB — LIPID PANEL
Chol/HDL Ratio: 1.5 ratio (ref 0.0–4.4)
Cholesterol, Total: 197 mg/dL (ref 100–199)
HDL: 135 mg/dL (ref >39)
LDL Chol Calc (NIH): 48 mg/dL (ref 0–99)
Triglycerides: 77 mg/dL (ref 0–149)
VLDL Cholesterol Cal: 14 mg/dL (ref 5–40)

## 2023-10-17 NOTE — Therapy (Signed)
 OUTPATIENT PHYSICAL THERAPY TREATMENT   Patient Name: [name] MRN: [id number] DOB:10/18/1950, 73 y.o., female Today's Date: 10/17/2023   END OF SESSION:  PT End of Session - 10/17/23 1405     Visit Number 8    Number of Visits 16    Date for PT Re-Evaluation 12/12/23    Authorization Type [employer]    PT Start Time 1400    PT Stop Time 1445    PT Time Calculation (min) 45 min    Activity Tolerance Patient tolerated treatment well    Behavior During Therapy [facility] for tasks assessed/performed              Past Medical History:  Diagnosis Date   Anemia    Anxiety    Arthritis    Complication of anesthesia    past hx. laryngospasm following 2 past surgeries, not recent   Depression    Exertional dyspnea 08/04/2019   GERD (gastroesophageal reflux disease)    Hiatal hernia 10/17/2019   Hyperlipidemia    Insomnia    periodically uses med.   Perforated sigmoid colon (HCC) 04/28/2014   during colonoscopy, "rupture colon"became septic, has colostomy and open surgical wound   Pneumonia    Pre-diabetes    Prediabetes 08/09/2017   Primary hypertension 01/05/2023   Suicide attempt (HCC)    Vitamin D deficiency    Past Surgical History:  Procedure Laterality Date   ABDOMINAL HYSTERECTOMY  1999   TAH/BSO   APPLICATION OF WOUND VAC  04/28/2014   Procedure: APPLICATION OF WOUND VAC;  Surgeon: Emelia Loron, MD;  Location: [facility] OR;  Service: General;;   BLEPHAROPLASTY  2010   BREAST SURGERY  2001   Breast reduction   COLOSTOMY N/A 04/28/2014   Procedure: COLOSTOMY;  Surgeon: Emelia Loron, MD;  Location: [facility] OR;  Service: General;  Laterality: N/A;   COLOSTOMY REVISION N/A 04/28/2014   Procedure: COLON RESECTION SIGMOID;  Surgeon: Emelia Loron, MD;  Location: [facility] OR;  Service: General;  Laterality: N/A;   COLOSTOMY TAKEDOWN N/A 09/02/2014   Procedure: LAPAROSCOPIC COLOSTOMY REVERSAL;  Surgeon: Romie Levee, MD;  Location: [facility] ORS;  Service:  General;  Laterality: N/A;   COSMETIC SURGERY     DEBRIDEMENT TENNIS ELBOW     DILATION AND CURETTAGE OF UTERUS     x3, hysteroscopy   ELBOW SURGERY  2007   EYE SURGERY     Blepharoplasty   LAPAROTOMY N/A 04/28/2014   Procedure: EXPLORATORY LAPAROTOMY,SIGMOID COLECTOMY;  Surgeon: Emelia Loron, MD;  Location: [facility] OR;  Service: General;  Laterality: N/A;   ORIF WRIST FRACTURE Left 02/19/2021   Procedure: Left wrist open reduction internal fixation and repair as indicated;  Surgeon: Bradly Bienenstock, MD;  Location: [facility] OR;  Service: Orthopedics;  Laterality: Left;    REDUCTION MAMMAPLASTY Bilateral    scar and adhesion repair  06/01/15   and liposuction   TOTAL KNEE ARTHROPLASTY Right 06/27/2022   Procedure: RIGHT TOTAL KNEE ARTHROPLASTY;  Surgeon: Marcene Corning, MD;  Location: [facility] ORS;  Service: Orthopedics;  Laterality: Right;   ULNAR TUNNEL RELEASE Left 2010   Patient Active Problem List   Diagnosis Date Noted   Migraine without aura and without status migrainosus, not intractable 10/16/2023   Lactose intolerance 10/16/2023   Primary hypertension 01/05/2023   Enlarged thyroid 12/24/2022   Myalgia due to statin 09/26/2022   Aortic atherosclerosis (HCC) 09/26/2022   S/P total knee arthroplasty, 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   Vitamin D deficiency 12/02/2020   Postmenopausal  12/02/2020   H/O abdominal hysterectomy 12/02/2020   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   Hiatal hernia 10/17/2019   Exertional dyspnea 08/04/2019   S/P colectomy 04/28/2014   Hyperlipidemia LDL goal <70 07/21/2010   Depression, controlled 07/21/2010   GERD 07/21/2010    PCP:  Ronnald Nian, MD Chilton Si, MD  REFERRING PROVIDER: [name], PA-C  REFERRING DIAG:  [id number].[name] (ICD-10-CM) - Fall, initial encounter  R26.89 (ICD-10-CM) - Balance problem   THERAPY DIAG:  Other abnormalities of gait and mobility  Muscle weakness (generalized)  Rationale for Evaluation and Treatment:  Rehabilitation  ONSET DATE: Chronic, November 2023   SUBJECTIVE:  SUBJECTIVE STATEMENT: Patient reports she is doing well. No new issues. Still feels off balance when on one foot and has to concentrate to avoid losing her balance.  EVAL: Patient is coming in with c/c of balance issues she states have been occurring since her total knee replacement on R side. Patient complaints of dizziness noted could be due to GI problems and rapid weight loss. Pt reports her doctor is aware of the previous issues and is working with the pt to find a solution. Pt complains of losing balance in tasks like getting down on the floor, putting pants on while standing, standing on surfaces, etc. She states she notices balance getting worse and has to focused on balance to stay stable. Patient wants to be proactive in improving balance issues to be able to continue participation and function in daily life.   PERTINENT HISTORY: TKA in November of 2023  PAIN:  Are you having pain? No  PRECAUTIONS: Fall  RED FLAGS: None   WEIGHT BEARING RESTRICTIONS: No  FALLS:  Has patient fallen in last 6 months? Yes. Number of falls 2 Most falls due to dizziness   LIVING ENVIRONMENT: Lives with: lives alone Lives in: House/apartment Stairs: Yes, internal and external but does not have concern about stairs.  Has following equipment at home: None  OCCUPATION: N/A  PLOF: Independent  PATIENT GOALS: Wants to get back to ADLs, wants to get back to the gym.    OBJECTIVE:  Note: Objective measures were completed at Evaluation unless otherwise noted PATIENT SURVEYS:  FOTO: 58% functional status 10/03/2023: 58% 10/17/2023: 70%    POSTURE: rounded shoulders and forward head  LOWER EXTREMITY ROM:  Active ROM Right eval Left eval  Hip flexion    Hip extension    Hip abduction    Hip adduction    Hip internal rotation    Hip external rotation    Knee flexion    Knee extension    Ankle dorsiflexion    Ankle  plantarflexion    Ankle inversion    Ankle eversion     (Blank rows = not tested)  LOWER EXTREMITY MMT:  MMT Right eval Left eval R/L eval Rt / Lt 10/17/2023  Hip flexion 3+ 3+ 4-/4- 4 / 4  Hip extension      Hip abduction 4 4 4/4 4 / 4  Hip adduction      Hip internal rotation      Hip external rotation      Knee flexion 5 5    Knee extension 5 5    Ankle dorsiflexion 5 5    Ankle plantarflexion 5 5    Ankle inversion      Ankle eversion       (Blank rows = not tested)   BERG BALANCE TEST - 10/17/2023 Sitting to Standing: 4.  Stands without using hands and stabilize independently Standing Unsupported: 4.      Stands safely for 2 minutes Sitting Unsupported: 4.     Sits for 2 minutes independently Standing to Sitting: 4.     Sits safely with minimal use of hands Transfers: 4.     Transfers safely with minor use of hands Standing with eyes closed: 4.     Stands safely for 10 seconds  Standing with feet together: 4.     Stands for 1 minute safely Reaching forward with outstretched arm: 4.     Reaches forward 10 inches Retrieving object from the floor: 4.      Able to pick up easily and safely Turning to look behind: 4.     Looks behind from both sides and weight shifts well Turning 360 degrees: 4.     Able to turn in </=4 seconds  - Loss of balance Place alternate foot on stool: 4.     Completes 8 steps in 20 seconds     Standing with one foot in front: 4.     Independent tandem for 30 seconds  Standing on one foot: 4.     Holds >10 seconds Total Score: 55/56  08/30/2023: Functional Gait Assessment: 26/30   GAIT: Dstance walked: 185 ft Assistive device utilized: None Level of assistance: Complete Independence Comments: Pt walked for 2 minutes with minimal loss of balance. Limited ankle dorsiflexion, knee flexion, and hip flexion specifically on the right LE limiting ability to ambulate without tripping. Right foot sits in slight eversion.                                                                                                                 TREATMENT TODAY:  [facility] Adult PT Treatment:                                                DATE: 10/17/2023 Elliptical L1 R1 x 5 min to improve endurance Goblet squat to table 15# 3 x 10 Runner step-up 2 x 10 each SLS with forward cone tap using opposite foot 2 x 10 each Stationary march with unilateral farmer's carry 2 x 20 each Standing hip diagonal abduction/extension with green at ankles 3 x 15 SL bridge 2 x 10 each   PATIENT EDUCATION:  Education details: POC extension, HEP Person educated: Patient Education method: Programmer, multimedia, Demonstration, Verbal cues Education comprehension: verbalized understanding, returned demonstration, verbal cues required, and needs further education  HOME EXERCISE PROGRAM: Access Code: [email or web address]   ASSESSMENT: CLINICAL IMPRESSION Patient tolerated therapy well with no adverse effects. Overall she is progressing well with her balance and strengthening exercises. She demonstrates improvement in her hip strength and reports functional status that has exceeded expected outcome on FOTO. She does continue to exhibit impairments with her dynamic balance and control that impacts her ability to perform daily tasks and  walking. She also reports fatigue with walking and exercise that limits her activity level. Patient also notes right hip flexor pain that seems to be more TFL related so could consider TPDN in future session. Therapy continued to progress hip and LE strength this visit, and incorporated work on pelvic trunk control with SLS movements. No changes made to her HEP this visit. Patient would benefit from continued skilled PT to progress her strength and balance in order to maximize functional ability.    EVAL: Patient is a 73 y.o. female who was seen today for physical therapy evaluation and treatment for balance deficits. Strength deficits specially in the hip likely impacting  stability. Gait disturbances potentially due to compensations after right TKA noted. Pt performs stable balance with limited difficulty but struggles when movement of UE or head is involved. Patient requires extreme focus to maintain stable balance. Today, FOTO was done indicating a 58% functional status. Hip strengthening was introduced as well as beginning balance training. Patient would benefit from skilled PT in order to improve balance to be able to participate in daily activities without concerns of balance loss.   OBJECTIVE IMPAIRMENTS: Abnormal gait, decreased balance, decreased strength, and dizziness.   ACTIVITY LIMITATIONS: bending, standing, stairs, transfers, and locomotion level  PARTICIPATION LIMITATIONS: community activity  PERSONAL FACTORS: Age, Past/current experiences, and Time since onset of injury/illness/exacerbation are also affecting patient's functional outcome.    GOALS: Goals reviewed with patient? Yes  SHORT TERM GOALS: Target date: 09/18/2023  Patient will be I with initial HEP in order to progress with therapy. Baseline: Goal status: MET  2.  Patient will increase hip flexion strength to a 4/5 to improve gait limitations. Baseline: 3+/5 10/02/2023: 4-/5 10/17/2023: 4/4 Goal status: MET  LONG TERM GOALS: Target date: 12/12/2023  Patient will be independent with final HEP in order to continue treatment at home for pain tolerance.  Baseline:  10/17/2023: ongoing Goal status: INITIAL  2.  Patient will score at least 65% on FOTO to signify clinically meaningful improvement in functional abilities.  Baseline: 58% 10/03/2023: 58% 10/17/2023: 70% Goal status: MET  3.  Patient will increase strength to a 5/5 in the LE to improve gait disturbances to allow for increased participation in daily activities.  Baseline: 3+/5 and 4/5 10/17/2023: 4/5 Goal status: ONGOING  4. Will assess DGI to assess dynamic gait disturbances and update goal based on  results..  Baseline: completed DGI and scored a 55/56.   Goal Status: MET  5. Patient will be able to transfer from the floor to a seat with no loss of balance to improve functional ability in the home.   Baseline: not assessed  10/03/2023: able to perform transfer to/from floor  Goal Status: MET   PLAN: PT FREQUENCY: 1x/week  PT DURATION: 8 weeks  PLANNED INTERVENTIONS: [id number]- PT Re-evaluation, 97110-Therapeutic exercises, 97530- Therapeutic activity, 97112- Neuromuscular re-education, 97535- Self Care, 16109- Manual therapy, [phone]- Gait training, Patient/Family education, Balance training, Stair training, Joint mobilization, Joint manipulation, Spinal manipulation, Spinal mobilization, Vestibular training, Cryotherapy, and Moist heat  PLAN FOR NEXT SESSION: Review/Revise HEP. Continue to work on lower extremity strength. Work on dual tasking, narrow stance, and directional changes during gait.   Rosana Hoes, PT, DPT, LAT, ATC 10/17/23  3:06 PM Phone: [phone] Fax: [phone]

## 2023-10-18 ENCOUNTER — Other Ambulatory Visit: Payer: Self-pay | Admitting: Adult Health

## 2023-10-18 ENCOUNTER — Other Ambulatory Visit: Payer: Self-pay

## 2023-10-18 DIAGNOSIS — G4700 Insomnia, unspecified: Secondary | ICD-10-CM

## 2023-10-19 ENCOUNTER — Other Ambulatory Visit (HOSPITAL_BASED_OUTPATIENT_CLINIC_OR_DEPARTMENT_OTHER): Payer: Self-pay

## 2023-10-19 MED ORDER — GABAPENTIN 600 MG PO TABS
600.0000 mg | ORAL_TABLET | Freq: Every evening | ORAL | 0 refills | Status: DC
  Filled 2023-10-19: qty 30, 30d supply, fill #0

## 2023-10-22 ENCOUNTER — Other Ambulatory Visit (HOSPITAL_BASED_OUTPATIENT_CLINIC_OR_DEPARTMENT_OTHER): Payer: Self-pay

## 2023-10-24 ENCOUNTER — Ambulatory Visit: Payer: Self-pay | Admitting: Physical Therapy

## 2023-10-24 ENCOUNTER — Encounter: Payer: Self-pay | Admitting: Physical Therapy

## 2023-10-24 ENCOUNTER — Other Ambulatory Visit: Payer: Self-pay

## 2023-10-24 DIAGNOSIS — R2689 Other abnormalities of gait and mobility: Secondary | ICD-10-CM

## 2023-10-24 DIAGNOSIS — M6281 Muscle weakness (generalized): Secondary | ICD-10-CM

## 2023-10-24 NOTE — Therapy (Unsigned)
 OUTPATIENT PHYSICAL THERAPY TREATMENT   Patient Name: [name] MRN: [id number] DOB:1950-11-02, 73 y.o., female Today's Date: 10/25/2023   END OF SESSION:  PT End of Session - 10/24/23 1449     Visit Number 9    Number of Visits 16    Date for PT Re-Evaluation 12/12/23    Authorization Type [employer]    PT Start Time 1447    PT Stop Time 1530    PT Time Calculation (min) 43 min    Activity Tolerance Patient tolerated treatment well    Behavior During Therapy [facility] for tasks assessed/performed              Past Medical History:  Diagnosis Date   Anemia    Anxiety    Arthritis    Complication of anesthesia    past hx. laryngospasm following 2 past surgeries, not recent   Depression    Exertional dyspnea 08/04/2019   GERD (gastroesophageal reflux disease)    Hiatal hernia 10/17/2019   Hyperlipidemia    Insomnia    periodically uses med.   Perforated sigmoid colon (HCC) 04/28/2014   during colonoscopy, "rupture colon"became septic, has colostomy and open surgical wound   Pneumonia    Pre-diabetes    Prediabetes 08/09/2017   Primary hypertension 01/05/2023   Suicide attempt (HCC)    Vitamin D deficiency    Past Surgical History:  Procedure Laterality Date   ABDOMINAL HYSTERECTOMY  1999   TAH/BSO   APPLICATION OF WOUND VAC  04/28/2014   Procedure: APPLICATION OF WOUND VAC;  Surgeon: Emelia Loron, MD;  Location: [facility] OR;  Service: General;;   BLEPHAROPLASTY  2010   BREAST SURGERY  2001   Breast reduction   COLOSTOMY N/A 04/28/2014   Procedure: COLOSTOMY;  Surgeon: Emelia Loron, MD;  Location: [facility] OR;  Service: General;  Laterality: N/A;   COLOSTOMY REVISION N/A 04/28/2014   Procedure: COLON RESECTION SIGMOID;  Surgeon: Emelia Loron, MD;  Location: [facility] OR;  Service: General;  Laterality: N/A;   COLOSTOMY TAKEDOWN N/A 09/02/2014   Procedure: LAPAROSCOPIC COLOSTOMY REVERSAL;  Surgeon: Romie Levee, MD;  Location: [facility] ORS;  Service:  General;  Laterality: N/A;   COSMETIC SURGERY     DEBRIDEMENT TENNIS ELBOW     DILATION AND CURETTAGE OF UTERUS     x3, hysteroscopy   ELBOW SURGERY  2007   EYE SURGERY     Blepharoplasty   LAPAROTOMY N/A 04/28/2014   Procedure: EXPLORATORY LAPAROTOMY,SIGMOID COLECTOMY;  Surgeon: Emelia Loron, MD;  Location: [facility] OR;  Service: General;  Laterality: N/A;   ORIF WRIST FRACTURE Left 02/19/2021   Procedure: Left wrist open reduction internal fixation and repair as indicated;  Surgeon: Bradly Bienenstock, MD;  Location: [facility] OR;  Service: Orthopedics;  Laterality: Left;    REDUCTION MAMMAPLASTY Bilateral    scar and adhesion repair  06/01/15   and liposuction   TOTAL KNEE ARTHROPLASTY Right 06/27/2022   Procedure: RIGHT TOTAL KNEE ARTHROPLASTY;  Surgeon: Marcene Corning, MD;  Location: [facility] ORS;  Service: Orthopedics;  Laterality: Right;   ULNAR TUNNEL RELEASE Left 2010   Patient Active Problem List   Diagnosis Date Noted   Migraine without aura and without status migrainosus, not intractable 10/16/2023   Lactose intolerance 10/16/2023   Primary hypertension 01/05/2023   Enlarged thyroid 12/24/2022   Myalgia due to statin 09/26/2022   Aortic atherosclerosis (HCC) 09/26/2022   S/P total knee arthroplasty, 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   Vitamin D deficiency 12/02/2020   Postmenopausal  12/02/2020   H/O abdominal hysterectomy 12/02/2020   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   Hiatal hernia 10/17/2019   Exertional dyspnea 08/04/2019   S/P colectomy 04/28/2014   Hyperlipidemia LDL goal <70 07/21/2010   Depression, controlled 07/21/2010   GERD 07/21/2010    PCP:  Ronnald Nian, MD Chilton Si, MD  REFERRING PROVIDER: [name], PA-C  REFERRING DIAG:  [name] (ICD-10-CM) - Fall, initial encounter  R26.89 (ICD-10-CM) - Balance problem   THERAPY DIAG:  Other abnormalities of gait and mobility  Muscle weakness (generalized)  Rationale for Evaluation and Treatment:  Rehabilitation  ONSET DATE: Chronic, November 2023   SUBJECTIVE:  SUBJECTIVE STATEMENT: Patient reports she can tell her balance is better when putting on pants in the morning.   EVAL: Patient is coming in with c/c of balance issues she states have been occurring since her total knee replacement on R side. Patient complaints of dizziness noted could be due to GI problems and rapid weight loss. Pt reports her doctor is aware of the previous issues and is working with the pt to find a solution. Pt complains of losing balance in tasks like getting down on the floor, putting pants on while standing, standing on surfaces, etc. She states she notices balance getting worse and has to focused on balance to stay stable. Patient wants to be proactive in improving balance issues to be able to continue participation and function in daily life.   PERTINENT HISTORY: TKA in November of 2023  PAIN:  Are you having pain? No  PRECAUTIONS: Fall  RED FLAGS: None   WEIGHT BEARING RESTRICTIONS: No  FALLS:  Has patient fallen in last 6 months? Yes. Number of falls 2 Most falls due to dizziness   LIVING ENVIRONMENT: Lives with: lives alone Lives in: House/apartment Stairs: Yes, internal and external but does not have concern about stairs.  Has following equipment at home: None  OCCUPATION: N/A  PLOF: Independent  PATIENT GOALS: Wants to get back to ADLs, wants to get back to the gym.    OBJECTIVE:  Note: Objective measures were completed at Evaluation unless otherwise noted PATIENT SURVEYS:  FOTO: 58% functional status 10/03/2023: 58% 10/17/2023: 70%    POSTURE: rounded shoulders and forward head  LOWER EXTREMITY ROM:  Active ROM Right eval Left eval  Hip flexion    Hip extension    Hip abduction    Hip adduction    Hip internal rotation    Hip external rotation    Knee flexion    Knee extension    Ankle dorsiflexion    Ankle plantarflexion    Ankle inversion    Ankle eversion      (Blank rows = not tested)  LOWER EXTREMITY MMT:  MMT Right eval Left eval R/L eval Rt / Lt 10/17/2023  Hip flexion 3+ 3+ 4-/4- 4 / 4  Hip extension      Hip abduction 4 4 4/4 4 / 4  Hip adduction      Hip internal rotation      Hip external rotation      Knee flexion 5 5    Knee extension 5 5    Ankle dorsiflexion 5 5    Ankle plantarflexion 5 5    Ankle inversion      Ankle eversion       (Blank rows = not tested)   BERG BALANCE TEST - 10/17/2023 Sitting to Standing: 4.      Stands without using hands and stabilize  independently Standing Unsupported: 4.      Stands safely for 2 minutes Sitting Unsupported: 4.     Sits for 2 minutes independently Standing to Sitting: 4.     Sits safely with minimal use of hands Transfers: 4.     Transfers safely with minor use of hands Standing with eyes closed: 4.     Stands safely for 10 seconds  Standing with feet together: 4.     Stands for 1 minute safely Reaching forward with outstretched arm: 4.     Reaches forward 10 inches Retrieving object from the floor: 4.      Able to pick up easily and safely Turning to look behind: 4.     Looks behind from both sides and weight shifts well Turning 360 degrees: 4.     Able to turn in </=4 seconds  - Loss of balance Place alternate foot on stool: 4.     Completes 8 steps in 20 seconds     Standing with one foot in front: 4.     Independent tandem for 30 seconds  Standing on one foot: 4.     Holds >10 seconds Total Score: 55/56  08/30/2023: Functional Gait Assessment: 26/30   GAIT: Dstance walked: 185 ft Assistive device utilized: None Level of assistance: Complete Independence Comments: Pt walked for 2 minutes with minimal loss of balance. Limited ankle dorsiflexion, knee flexion, and hip flexion specifically on the right LE limiting ability to ambulate without tripping. Right foot sits in slight eversion.                                                                                                                 TREATMENT TODAY:  [facility] Adult PT Treatment:                                                DATE: 10/24/2023 Elliptical L1 R3 x 5 min to improve endurance SL bridge 2 x 10 each  Goblet squat to table 25# 3 x 10 Forward 8" step-up holding 5# bilaterally 3 x 10 each Stationary march with unilateral farmer's carry with 15# 2 x 20 each Bent over hip diagonal abduction/extension with green at ankles 3 x 15 SLS on Airex 3 x 30 sec each   PATIENT EDUCATION:  Education details: HEP Person educated: Patient Education method: Programmer, multimedia, Facilities manager, Verbal cues Education comprehension: verbalized understanding, returned demonstration, verbal cues required, and needs further education  HOME EXERCISE PROGRAM: Access Code: [email or web address]   ASSESSMENT: CLINICAL IMPRESSION Patient tolerated therapy well with no adverse effects. Therapy continues to focus on LE strengthening and balance training with good tolerance. She was able to progress with weighted resistance and utilized unstable surface for balance training. No pain reported with therapy. No changes made to HEP. Patient would benefit from continued skilled PT to progress her strength and balance in order to  maximize functional ability.    EVAL: Patient is a 73 y.o. female who was seen today for physical therapy evaluation and treatment for balance deficits. Strength deficits specially in the hip likely impacting stability. Gait disturbances potentially due to compensations after right TKA noted. Pt performs stable balance with limited difficulty but struggles when movement of UE or head is involved. Patient requires extreme focus to maintain stable balance. Today, FOTO was done indicating a 58% functional status. Hip strengthening was introduced as well as beginning balance training. Patient would benefit from skilled PT in order to improve balance to be able to participate in daily activities without concerns of balance  loss.   OBJECTIVE IMPAIRMENTS: Abnormal gait, decreased balance, decreased strength, and dizziness.   ACTIVITY LIMITATIONS: bending, standing, stairs, transfers, and locomotion level  PARTICIPATION LIMITATIONS: community activity  PERSONAL FACTORS: Age, Past/current experiences, and Time since onset of injury/illness/exacerbation are also affecting patient's functional outcome.    GOALS: Goals reviewed with patient? Yes  SHORT TERM GOALS: Target date: 09/18/2023  Patient will be I with initial HEP in order to progress with therapy. Baseline: Goal status: MET  2.  Patient will increase hip flexion strength to a 4/5 to improve gait limitations. Baseline: 3+/5 10/02/2023: 4-/5 10/17/2023: 4/4 Goal status: MET  LONG TERM GOALS: Target date: 12/12/2023  Patient will be independent with final HEP in order to continue treatment at home for pain tolerance.  Baseline:  10/17/2023: ongoing Goal status: INITIAL  2.  Patient will score at least 65% on FOTO to signify clinically meaningful improvement in functional abilities.  Baseline: 58% 10/03/2023: 58% 10/17/2023: 70% Goal status: MET  3.  Patient will increase strength to a 5/5 in the LE to improve gait disturbances to allow for increased participation in daily activities.  Baseline: 3+/5 and 4/5 10/17/2023: 4/5 Goal status: ONGOING  4. Will assess DGI to assess dynamic gait disturbances and update goal based on results..  Baseline: completed DGI and scored a 55/56.   Goal Status: MET  5. Patient will be able to transfer from the floor to a seat with no loss of balance to improve functional ability in the home.   Baseline: not assessed  10/03/2023: able to perform transfer to/from floor  Goal Status: MET   PLAN: PT FREQUENCY: 1x/week  PT DURATION: 8 weeks  PLANNED INTERVENTIONS: 97164- PT Re-evaluation, 97110-Therapeutic exercises, 97530- Therapeutic activity, 97112- Neuromuscular re-education, 97535- Self Care, 56387-  Manual therapy, [phone]- Gait training, Patient/Family education, Balance training, Stair training, Joint mobilization, Joint manipulation, Spinal manipulation, Spinal mobilization, Vestibular training, Cryotherapy, and Moist heat  PLAN FOR NEXT SESSION: Review/Revise HEP. Continue to work on lower extremity strength. Work on dual tasking, narrow stance, and directional changes during gait.   Rosana Hoes, PT, DPT, LAT, ATC 10/25/23  7:52 AM Phone: [phone] Fax: [phone]

## 2023-10-26 ENCOUNTER — Ambulatory Visit: Payer: PPO | Admitting: Adult Health

## 2023-10-26 ENCOUNTER — Other Ambulatory Visit (HOSPITAL_BASED_OUTPATIENT_CLINIC_OR_DEPARTMENT_OTHER): Payer: Self-pay

## 2023-10-26 ENCOUNTER — Encounter: Payer: Self-pay | Admitting: Adult Health

## 2023-10-26 DIAGNOSIS — F331 Major depressive disorder, recurrent, moderate: Secondary | ICD-10-CM | POA: Diagnosis not present

## 2023-10-26 DIAGNOSIS — G4700 Insomnia, unspecified: Secondary | ICD-10-CM | POA: Diagnosis not present

## 2023-10-26 MED ORDER — GABAPENTIN 600 MG PO TABS
600.0000 mg | ORAL_TABLET | Freq: Every evening | ORAL | 1 refills | Status: DC
  Filled 2023-10-26 – 2023-11-28 (×2): qty 90, 90d supply, fill #0

## 2023-10-26 MED ORDER — TOPIRAMATE 25 MG PO TABS
75.0000 mg | ORAL_TABLET | Freq: Every day | ORAL | 1 refills | Status: DC
  Filled 2023-10-26: qty 270, 90d supply, fill #0

## 2023-10-26 MED ORDER — BUPROPION HCL ER (XL) 150 MG PO TB24
150.0000 mg | ORAL_TABLET | Freq: Every day | ORAL | 3 refills | Status: DC
Start: 2023-10-26 — End: 2024-04-22
  Filled 2023-10-26 – 2023-12-04 (×2): qty 90, 90d supply, fill #0
  Filled 2024-02-29: qty 90, 90d supply, fill #1

## 2023-10-26 NOTE — Progress Notes (Signed)
 [name] [id number] [birth year] 73 y.o.  Subjective:   Patient ID:  [name] is a 73 y.o. (DOB 12-14-1950) female.  Chief Complaint: No chief complaint on file.   HPI JACKELYN ILLINGWORTH presents to the office today for follow-up of MDD and insomnia.   Describes mood today as "ok". Pleasant. Reports decreased tearfulness. Mood symptoms - reports  depression, anxiety and irritability. Reports lacking interest and motivation - "having to push myself". Denies panic attacks. Reports some worry and rumination. Denies over thinking. Mood is consistent. Stating "I have fair days and bad days - not a lot of good days". Feels like medications are helpful, but would like to consider other options. Taking medications as prescribed. Working with therapist regularly [place].  Energy levels lower. Active, working on a regular exercise routine. Enjoys some usual interests and activities. Lives alone - 2 cats. Has 2 children son 90 [place], daughter 32 in [place] and 2 grandchildren.  Appetite adequate. Weight loss - 140 from 153 pounds. Sleeps well most nights. Averages 8 hours. Focus and concentration stable. Completing tasks. Managing aspects of household. Retired. Denies SI or HI.  Denies AH or VH. Denies self harm. Denies substance use.  Reports alcohol use.  Previous medication trials: Zoloft, Lamictal - low sodium, Celexa, Wellbutrin, Lexapro, Effexor, Vraylar    AUDIT    Flowsheet Row Office Visit from 10/16/2023 in [facility]  Alcohol Use Disorder Identification Test Final Score (AUDIT) 3       PHQ2-9    Flowsheet Row Clinical Support from 10/09/2023 in [facility] Clinical Support from 10/03/2022 in [facility] Office Visit from 05/08/2022 in [facility] for [facility] at [facility] Office Visit from 04/03/2022 in [facility] for [facility] at [facility] Office Visit from 03/02/2022  in [facility] for [facility] at [facility]  PHQ-2 Total Score 6 3 0 0 0  PHQ-9 Total Score 12 6 -- -- --      Flowsheet Row ED from 06/23/2023 in [facility] at [facility] ED from 09/16/2022 in [facility] at [facility] Admission (Discharged) from 06/27/2022 in [place] [facility]  C-SSRS RISK CATEGORY No Risk No Risk No Risk        Review of Systems:  Review of Systems  Musculoskeletal:  Negative for gait problem.  Neurological:  Negative for tremors.  Psychiatric/Behavioral:         Please refer to HPI    Medications: I have reviewed the patient's current medications.  Current Outpatient Medications  Medication Sig Dispense Refill   amLODipine (NORVASC) 5 MG tablet Take 1 tablet (5 mg total) by mouth daily. 90 tablet 3   buPROPion (WELLBUTRIN XL) 150 MG 24 hr tablet Take 1 tablet (150 mg total) by mouth daily. 90 tablet 3   buPROPion (WELLBUTRIN XL) 300 MG 24 hr tablet Take 1 tablet (300 mg total) by mouth daily. (Patient taking differently: Take 450 mg by mouth daily.) 90 tablet 3   eszopiclone (LUNESTA) 2 MG TABS tablet Take 1 tablet (2 mg total) by mouth at bedtime. 90 tablet 2   Evolocumab (REPATHA SURECLICK) 140 MG/ML SOAJ Inject 140 mg into the skin every 14 (fourteen) days. 6 mL 3   gabapentin (NEURONTIN) 600 MG tablet Take 1 tablet (600 mg total) by mouth every evening. 90 tablet 1   metoprolol succinate (TOPROL-XL) 25 MG 24 hr tablet Take 1 tablet (25 mg total)  by mouth daily. 90 tablet 3   ondansetron (ZOFRAN-ODT) 4 MG disintegrating tablet Dissolve 1-2 tablets under the tongue every 6 hours as needed (Patient not taking: Reported on 10/16/2023) 30 tablet 2   pantoprazole (PROTONIX) 40 MG tablet Take 1 tablet (40 mg total) by mouth 2 (two) times daily. 180 tablet 3   prazosin (MINIPRESS) 2 MG capsule Take 1 capsule (2 mg total) by mouth at bedtime. 90 capsule 3   prochlorperazine (COMPAZINE) 10 MG tablet Take 1  tablet (10 mg total) by mouth every 8 (eight) hours as needed for nausea or vomiting. (Patient not taking: Reported on 10/16/2023) 15 tablet 0   topiramate (TOPAMAX) 25 MG tablet Take three tablets daily. 270 tablet 1   Vitamin D, Ergocalciferol, (DRISDOL) 1.25 MG (50000 UNIT) CAPS capsule Take 1 capsule by mouth every 7 days. 12 capsule 3   No current facility-administered medications for this visit.    Medication Side Effects: None  Allergies:  Allergies  Allergen Reactions   Bee Venom Swelling    Yellow jackets   Statins Other (See Comments)    Muscle pains  CRESTOR, LIPITOR    Past Medical History:  Diagnosis Date   Anemia    Anxiety    Arthritis    Complication of anesthesia    past hx. laryngospasm following 2 past surgeries, not recent   Depression    Exertional dyspnea 08/04/2019   GERD (gastroesophageal reflux disease)    Hiatal hernia 10/17/2019   Hyperlipidemia    Insomnia    periodically uses med.   Perforated sigmoid colon (HCC) 04/28/2014   during colonoscopy, "rupture colon"became septic, has colostomy and open surgical wound   Pneumonia    Pre-diabetes    Prediabetes 08/09/2017   Primary hypertension 01/05/2023   Suicide attempt (HCC)    Vitamin D deficiency     Past Medical History, Surgical history, Social history, and Family history were reviewed and updated as appropriate.   Please see review of systems for further details on the patient's review from today.   Objective:   Physical Exam:  LMP 08/07/1998   Physical Exam Constitutional:      General: She is not in acute distress. Musculoskeletal:        General: No deformity.  Neurological:     Mental Status: She is alert and oriented to person, place, and time.     Coordination: Coordination normal.  Psychiatric:        Attention and Perception: Attention and perception normal. She does not perceive auditory or visual hallucinations.        Mood and Affect: Mood normal. Mood is not  anxious or depressed. Affect is not labile, blunt, angry or inappropriate.        Speech: Speech normal.        Behavior: Behavior normal.        Thought Content: Thought content normal. Thought content is not paranoid or delusional. Thought content does not include homicidal or suicidal ideation. Thought content does not include homicidal or suicidal plan.        Cognition and Memory: Cognition and memory normal.        Judgment: Judgment normal.     Comments: Insight intact     Lab Review:     Component Value Date/Time   NA 136 05/23/2023 1436   NA 135 01/05/2023 0940   K 3.7 05/23/2023 1436   CL 102 05/23/2023 1436   CO2 23 05/23/2023 1436   GLUCOSE 108 (  H) 05/23/2023 1436   BUN 7 05/23/2023 1436   BUN 8 01/05/2023 0940   CREATININE 0.86 05/23/2023 1436   CREATININE 0.99 08/08/2017 1629   CALCIUM 9.7 05/23/2023 1436   PROT 7.4 05/23/2023 1436   PROT 6.7 01/05/2023 0940   ALBUMIN 4.5 05/23/2023 1436   ALBUMIN 4.7 01/05/2023 0940   AST 27 05/23/2023 1436   ALT 24 05/23/2023 1436   ALKPHOS 63 05/23/2023 1436   BILITOT 0.5 05/23/2023 1436   BILITOT 0.4 01/05/2023 0940   GFRNONAA 43 (L) 09/16/2022 1415   GFRNONAA 76 04/14/2014 1427   GFRAA 72 08/27/2019 1112   GFRAA 88 04/14/2014 1427       Component Value Date/Time   WBC 7.9 05/23/2023 1436   RBC 4.50 05/23/2023 1436   HGB 14.5 05/23/2023 1436   HGB 12.3 12/11/2022 1030   HGB 11.9 (L) 06/14/2015 1528   HCT 43.9 05/23/2023 1436   HCT 36.4 12/11/2022 1030   PLT 328.0 05/23/2023 1436   PLT 315 12/11/2022 1030   MCV 97.7 05/23/2023 1436   MCV 96 12/11/2022 1030   MCH 32.5 12/11/2022 1030   MCH 31.8 09/16/2022 1415   MCHC 33.0 05/23/2023 1436   RDW 13.4 05/23/2023 1436   RDW 12.7 12/11/2022 1030   LYMPHSABS 1.8 05/23/2023 1436   LYMPHSABS 1.5 12/11/2022 1030   MONOABS 0.5 05/23/2023 1436   EOSABS 0.1 05/23/2023 1436   EOSABS 0.2 12/11/2022 1030   BASOSABS 0.0 05/23/2023 1436   BASOSABS 0.0 12/11/2022 1030     No results found for: "POCLITH", "LITHIUM"   No results found for: "PHENYTOIN", "PHENOBARB", "VALPROATE", "CBMZ"   .res Assessment: Plan:    Treatment Plan/Recommendations:   Plan:  PDMP reviewed  Add Cymbalta 30mg  daily - has not started yet with recent infection.  Increase Topamax 25mg  - 2 at hs to 3 at hs  Will continue: Gabapentin 600mg  at hs Minipress 2mg  at hs Lunesta 2mg  at hs Wellbutrin XL 450mg  daily - denies seizure history.  Consider Spravato  RTC 3 months  25 minutes spent dedicated to the care of this patient on the date of this encounter to include pre-visit review of records, ordering of medication, post visit documentation, and face-to-face time with the patient discussing MDD and insomnia. Discussed continuing current medication regimen.  Patient advised to contact office with any questions, adverse effects, or acute worsening in signs and symptoms.  Diagnoses and all orders for this visit:  Insomnia, unspecified type -     gabapentin (NEURONTIN) 600 MG tablet; Take 1 tablet (600 mg total) by mouth every evening.  Major depressive disorder, recurrent episode, moderate (HCC) -     buPROPion (WELLBUTRIN XL) 150 MG 24 hr tablet; Take 1 tablet (150 mg total) by mouth daily.  Other orders -     topiramate (TOPAMAX) 25 MG tablet; Take three tablets daily.     Please see After Visit Summary for patient specific instructions.  Future Appointments  Date Time Provider Department Center  10/31/2023  2:00 PM Tyrone Nine [facility]  01/22/2024  9:20 AM Chilton Si, MD [facility]  02/01/2024  1:50 PM Drema Dallas, DO [facility] None  10/14/2024  3:30 PM [facility]-ANNUAL WELLNESS VISIT [facility]  10/16/2024  2:00 PM Brenton Grills, MD [facility]    No orders of the defined types were placed in this encounter.   -------------------------------

## 2023-10-29 ENCOUNTER — Other Ambulatory Visit: Payer: Self-pay

## 2023-10-31 ENCOUNTER — Other Ambulatory Visit: Payer: Self-pay | Admitting: Obstetrics & Gynecology

## 2023-10-31 ENCOUNTER — Encounter: Payer: Self-pay | Admitting: Physical Therapy

## 2023-10-31 ENCOUNTER — Other Ambulatory Visit: Payer: Self-pay

## 2023-10-31 ENCOUNTER — Ambulatory Visit: Payer: Self-pay | Admitting: Physical Therapy

## 2023-10-31 DIAGNOSIS — R2689 Other abnormalities of gait and mobility: Secondary | ICD-10-CM

## 2023-10-31 DIAGNOSIS — Z1231 Encounter for screening mammogram for malignant neoplasm of breast: Secondary | ICD-10-CM

## 2023-10-31 DIAGNOSIS — M6281 Muscle weakness (generalized): Secondary | ICD-10-CM

## 2023-10-31 NOTE — Therapy (Signed)
 OUTPATIENT PHYSICAL THERAPY TREATMENT  DISCHARGE   Patient Name: [name] MRN: [id number] DOB:1951-03-19, 73 y.o., female Today's Date: 10/31/2023   END OF SESSION:  PT End of Session - 10/31/23 1400     Visit Number 10    Number of Visits 16    Date for PT Re-Evaluation 12/12/23    Authorization Type [employer]    PT Start Time 1400    PT Stop Time 1445    PT Time Calculation (min) 45 min    Activity Tolerance Patient tolerated treatment well    Behavior During Therapy [facility] for tasks assessed/performed               Past Medical History:  Diagnosis Date   Anemia    Anxiety    Arthritis    Complication of anesthesia    past hx. laryngospasm following 2 past surgeries, not recent   Depression    Exertional dyspnea 08/04/2019   GERD (gastroesophageal reflux disease)    Hiatal hernia 10/17/2019   Hyperlipidemia    Insomnia    periodically uses med.   Perforated sigmoid colon (HCC) 04/28/2014   during colonoscopy, "rupture colon"became septic, has colostomy and open surgical wound   Pneumonia    Pre-diabetes    Prediabetes 08/09/2017   Primary hypertension 01/05/2023   Suicide attempt (HCC)    Vitamin D deficiency    Past Surgical History:  Procedure Laterality Date   ABDOMINAL HYSTERECTOMY  1999   TAH/BSO   APPLICATION OF WOUND VAC  04/28/2014   Procedure: APPLICATION OF WOUND VAC;  Surgeon: Emelia Loron, MD;  Location: [facility] OR;  Service: General;;   BLEPHAROPLASTY  2010   BREAST SURGERY  2001   Breast reduction   COLOSTOMY N/A 04/28/2014   Procedure: COLOSTOMY;  Surgeon: Emelia Loron, MD;  Location: [facility] OR;  Service: General;  Laterality: N/A;   COLOSTOMY REVISION N/A 04/28/2014   Procedure: COLON RESECTION SIGMOID;  Surgeon: Emelia Loron, MD;  Location: [facility] OR;  Service: General;  Laterality: N/A;   COLOSTOMY TAKEDOWN N/A 09/02/2014   Procedure: LAPAROSCOPIC COLOSTOMY REVERSAL;  Surgeon: Romie Levee, MD;  Location: [facility] ORS;   Service: General;  Laterality: N/A;   COSMETIC SURGERY     DEBRIDEMENT TENNIS ELBOW     DILATION AND CURETTAGE OF UTERUS     x3, hysteroscopy   ELBOW SURGERY  2007   EYE SURGERY     Blepharoplasty   LAPAROTOMY N/A 04/28/2014   Procedure: EXPLORATORY LAPAROTOMY,SIGMOID COLECTOMY;  Surgeon: Emelia Loron, MD;  Location: [facility] OR;  Service: General;  Laterality: N/A;   ORIF WRIST FRACTURE Left 02/19/2021   Procedure: Left wrist open reduction internal fixation and repair as indicated;  Surgeon: Bradly Bienenstock, MD;  Location: [facility] OR;  Service: Orthopedics;  Laterality: Left;    REDUCTION MAMMAPLASTY Bilateral    scar and adhesion repair  06/01/15   and liposuction   TOTAL KNEE ARTHROPLASTY Right 06/27/2022   Procedure: RIGHT TOTAL KNEE ARTHROPLASTY;  Surgeon: Marcene Corning, MD;  Location: [facility] ORS;  Service: Orthopedics;  Laterality: Right;   ULNAR TUNNEL RELEASE Left 2010   Patient Active Problem List   Diagnosis Date Noted   Migraine without aura and without status migrainosus, not intractable 10/16/2023   Lactose intolerance 10/16/2023   Primary hypertension 01/05/2023   Enlarged thyroid 12/24/2022   Myalgia due to statin 09/26/2022   Aortic atherosclerosis (HCC) 09/26/2022   S/P total knee arthroplasty, 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   Vitamin D deficiency 12/02/2020  Postmenopausal 12/02/2020   H/O abdominal hysterectomy 12/02/2020   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   Hiatal hernia 10/17/2019   Exertional dyspnea 08/04/2019   S/P colectomy 04/28/2014   Hyperlipidemia LDL goal <70 07/21/2010   Depression, controlled 07/21/2010   GERD 07/21/2010    PCP:  Ronnald Nian, MD Chilton Si, MD  REFERRING PROVIDER: [name], PA-C  REFERRING DIAG:  [id number].[name] (ICD-10-CM) - Fall, initial encounter  R26.89 (ICD-10-CM) - Balance problem   THERAPY DIAG:  Other abnormalities of gait and mobility  Muscle weakness (generalized)  Rationale for Evaluation and Treatment:  Rehabilitation  ONSET DATE: Chronic, November 2023   SUBJECTIVE:  SUBJECTIVE STATEMENT: Patient reports she is doing well with no issues. She is ready for discharge.  EVAL: Patient is coming in with c/c of balance issues she states have been occurring since her total knee replacement on R side. Patient complaints of dizziness noted could be due to GI problems and rapid weight loss. Pt reports her doctor is aware of the previous issues and is working with the pt to find a solution. Pt complains of losing balance in tasks like getting down on the floor, putting pants on while standing, standing on surfaces, etc. She states she notices balance getting worse and has to focused on balance to stay stable. Patient wants to be proactive in improving balance issues to be able to continue participation and function in daily life.   PERTINENT HISTORY: TKA in November of 2023  PAIN:  Are you having pain? No  PRECAUTIONS: Fall  RED FLAGS: None   WEIGHT BEARING RESTRICTIONS: No  FALLS:  Has patient fallen in last 6 months? Yes. Number of falls 2 Most falls due to dizziness   LIVING ENVIRONMENT: Lives with: lives alone Lives in: House/apartment Stairs: Yes, internal and external but does not have concern about stairs.  Has following equipment at home: None  OCCUPATION: N/A  PLOF: Independent  PATIENT GOALS: Wants to get back to ADLs, wants to get back to the gym.    OBJECTIVE:  Note: Objective measures were completed at Evaluation unless otherwise noted PATIENT SURVEYS:  FOTO: 58% functional status 10/03/2023: 58% 10/17/2023: 70%    POSTURE: rounded shoulders and forward head  LOWER EXTREMITY ROM:  Active ROM Right eval Left eval  Hip flexion    Hip extension    Hip abduction    Hip adduction    Hip internal rotation    Hip external rotation    Knee flexion    Knee extension    Ankle dorsiflexion    Ankle plantarflexion    Ankle inversion    Ankle eversion     (Blank  rows = not tested)  LOWER EXTREMITY MMT:  MMT Right eval Left eval R/L eval Rt / Lt 10/17/2023 Rt / Lt 10/31/2023  Hip flexion 3+ 3+ 4-/4- 4 / 4 5 / 5  Hip extension       Hip abduction 4 4 4/4 4 / 4 5 / 5  Hip adduction       Hip internal rotation       Hip external rotation       Knee flexion 5 5     Knee extension 5 5     Ankle dorsiflexion 5 5     Ankle plantarflexion 5 5     Ankle inversion       Ankle eversion        (Blank rows = not tested)   BERG BALANCE  TEST - 10/17/2023 Sitting to Standing: 4.      Stands without using hands and stabilize independently Standing Unsupported: 4.      Stands safely for 2 minutes Sitting Unsupported: 4.     Sits for 2 minutes independently Standing to Sitting: 4.     Sits safely with minimal use of hands Transfers: 4.     Transfers safely with minor use of hands Standing with eyes closed: 4.     Stands safely for 10 seconds  Standing with feet together: 4.     Stands for 1 minute safely Reaching forward with outstretched arm: 4.     Reaches forward 10 inches Retrieving object from the floor: 4.      Able to pick up easily and safely Turning to look behind: 4.     Looks behind from both sides and weight shifts well Turning 360 degrees: 4.     Able to turn in </=4 seconds  - Loss of balance Place alternate foot on stool: 4.     Completes 8 steps in 20 seconds     Standing with one foot in front: 4.     Independent tandem for 30 seconds  Standing on one foot: 4.     Holds >10 seconds Total Score: 55/56  08/30/2023: Functional Gait Assessment: 26/30   GAIT: Dstance walked: 185 ft Assistive device utilized: None Level of assistance: Complete Independence Comments: Pt walked for 2 minutes with minimal loss of balance. Limited ankle dorsiflexion, knee flexion, and hip flexion specifically on the right LE limiting ability to ambulate without tripping. Right foot sits in slight eversion.                                                                                                                 TREATMENT TODAY:  [facility] Adult PT Treatment:                                                DATE: 10/31/2023 Elliptical L1 R3 x 6 min to improve endurance Goblet squat to table with 10# 2 x 10 Deadlift with 25# 2 x 10 Lateral band walk with green at ankles x 3 laps Standing hip extension with green at ankles 3 x 10 SLS 2 x 30 sec each Runner 8" step-up 2 x 10 each BATCA leg press 55# 3 x 10   PATIENT EDUCATION:  Education details: POC discharge, HEP update Person educated: Patient Education method: Programmer, multimedia, Demonstration, Handout Education comprehension: Verbalized understanding, returned demonstration  HOME EXERCISE PROGRAM: Access Code: [email or web address]   ASSESSMENT: CLINICAL IMPRESSION Patient tolerated therapy well with no adverse effects. She demonstrates great improvement with PT, demonstrating improved strength, balance, and functional ability. Session focused on preparing final HEP for patient to progress strengthening and balance at home and at gym. She was able to complete all exercises correctly. Patient will be formally  discharged from PT as she has met all goals.    EVAL: Patient is a 73 y.o. female who was seen today for physical therapy evaluation and treatment for balance deficits. Strength deficits specially in the hip likely impacting stability. Gait disturbances potentially due to compensations after right TKA noted. Pt performs stable balance with limited difficulty but struggles when movement of UE or head is involved. Patient requires extreme focus to maintain stable balance. Today, FOTO was done indicating a 58% functional status. Hip strengthening was introduced as well as beginning balance training. Patient would benefit from skilled PT in order to improve balance to be able to participate in daily activities without concerns of balance loss.   OBJECTIVE IMPAIRMENTS: Abnormal gait, decreased balance, decreased  strength, and dizziness.   ACTIVITY LIMITATIONS: bending, standing, stairs, transfers, and locomotion level  PARTICIPATION LIMITATIONS: community activity  PERSONAL FACTORS: Age, Past/current experiences, and Time since onset of injury/illness/exacerbation are also affecting patient's functional outcome.    GOALS: Goals reviewed with patient? Yes  SHORT TERM GOALS: Target date: 09/18/2023  Patient will be I with initial HEP in order to progress with therapy. Baseline: Goal status: MET  2.  Patient will increase hip flexion strength to a 4/5 to improve gait limitations. Baseline: 3+/5 10/02/2023: 4-/5 10/17/2023: 4/4 Goal status: MET  LONG TERM GOALS: Target date: 12/12/2023  Patient will be independent with final HEP in order to continue treatment at home for pain tolerance.  Baseline:  10/17/2023: ongoing 10/31/2023: independent Goal status: MET  2.  Patient will score at least 65% on FOTO to signify clinically meaningful improvement in functional abilities.  Baseline: 58% 10/03/2023: 58% 10/17/2023: 70% Goal status: MET  3.  Patient will increase strength to a 5/5 in the LE to improve gait disturbances to allow for increased participation in daily activities.  Baseline: 3+/5 and 4/5 10/17/2023: 4/5 10/31/2023: 5/5 MMT Goal status: MET  4. Will assess DGI to assess dynamic gait disturbances and update goal based on results..  Baseline: completed DGI and scored a 55/56.   Goal Status: MET  5. Patient will be able to transfer from the floor to a seat with no loss of balance to improve functional ability in the home.   Baseline: not assessed  10/03/2023: able to perform transfer to/from floor  Goal Status: MET   PLAN: PT FREQUENCY: 1x/week  PT DURATION: 8 weeks  PLANNED INTERVENTIONS: 97164- PT Re-evaluation, 97110-Therapeutic exercises, 97530- Therapeutic activity, 97112- Neuromuscular re-education, 97535- Self Care, 16109- Manual therapy, [phone]- Gait training,  Patient/Family education, Balance training, Stair training, Joint mobilization, Joint manipulation, Spinal manipulation, Spinal mobilization, Vestibular training, Cryotherapy, and Moist heat  PLAN FOR NEXT SESSION: NA - discharge   Rosana Hoes, PT, DPT, LAT, ATC 10/31/23  2:54 PM Phone: [phone] Fax: [phone]   PHYSICAL THERAPY DISCHARGE SUMMARY  Visits from Start of Care: 10  Current functional level related to goals / functional outcomes: See above   Remaining deficits: See above   Education / Equipment: HEP   Patient agrees to discharge. Patient goals were met. Patient is being discharged due to meeting the stated rehab goals.

## 2023-10-31 NOTE — Patient Instructions (Signed)
 Access Code: [email or web address] URL: [email or web address] Date: 10/31/2023 Prepared by: Rosana Hoes  Exercises - Standing Hip Extension with Resistance at Ankles and Unilateral Counter Support  - 3 sets - 10 reps - Side Stepping with Resistance at Ankles  - 3 sets - 20 reps - Single Leg Stance  - 3 reps - 30 seconds hold - Goblet Squat with Kettlebell  - 3 sets - 10 reps - Kettlebell Deadlift  - 3 sets - 10 reps - Runner's Step Up/Down  - 3 sets - 10 reps - Full Leg Press  - 3 sets - 10 reps

## 2023-11-07 ENCOUNTER — Encounter: Payer: Self-pay | Admitting: Physical Therapy

## 2023-11-11 ENCOUNTER — Other Ambulatory Visit: Payer: Self-pay | Admitting: Physician Assistant

## 2023-11-11 ENCOUNTER — Other Ambulatory Visit: Payer: Self-pay | Admitting: Adult Health

## 2023-11-11 DIAGNOSIS — G4700 Insomnia, unspecified: Secondary | ICD-10-CM

## 2023-11-12 ENCOUNTER — Other Ambulatory Visit (HOSPITAL_BASED_OUTPATIENT_CLINIC_OR_DEPARTMENT_OTHER): Payer: Self-pay

## 2023-11-12 MED ORDER — ESZOPICLONE 2 MG PO TABS
2.0000 mg | ORAL_TABLET | Freq: Every evening | ORAL | 0 refills | Status: DC
  Filled 2023-11-12: qty 90, 90d supply, fill #0

## 2023-11-12 MED ORDER — ONDANSETRON 4 MG PO TBDP
ORAL_TABLET | ORAL | 2 refills | Status: AC
Start: 2023-11-12 — End: ?
  Filled 2023-11-12: qty 30, 5d supply, fill #0
  Filled 2024-02-10: qty 30, 5d supply, fill #1

## 2023-11-14 ENCOUNTER — Encounter: Payer: Self-pay | Admitting: Physical Therapy

## 2023-11-15 ENCOUNTER — Ambulatory Visit: Payer: Self-pay

## 2023-11-15 ENCOUNTER — Ambulatory Visit: Payer: Self-pay | Admitting: Family Medicine

## 2023-11-15 ENCOUNTER — Ambulatory Visit (HOSPITAL_COMMUNITY)
Admission: EM | Admit: 2023-11-15 | Discharge: 2023-11-15 | Disposition: A | Discharge status: Home / Self Care | Attending: Emergency Medicine | Admitting: Emergency Medicine

## 2023-11-15 ENCOUNTER — Encounter (HOSPITAL_COMMUNITY): Payer: Self-pay

## 2023-11-15 ENCOUNTER — Ambulatory Visit: Admitting: Medical

## 2023-11-15 ENCOUNTER — Ambulatory Visit (INDEPENDENT_AMBULATORY_CARE_PROVIDER_SITE_OTHER)

## 2023-11-15 DIAGNOSIS — M79671 Pain in right foot: Secondary | ICD-10-CM

## 2023-11-15 DIAGNOSIS — M19071 Primary osteoarthritis, right ankle and foot: Secondary | ICD-10-CM | POA: Diagnosis not present

## 2023-11-15 DIAGNOSIS — S92911A Unspecified fracture of right toe(s), initial encounter for closed fracture: Secondary | ICD-10-CM | POA: Diagnosis not present

## 2023-11-15 DIAGNOSIS — S92511A Displaced fracture of proximal phalanx of right lesser toe(s), initial encounter for closed fracture: Secondary | ICD-10-CM | POA: Diagnosis not present

## 2023-11-15 NOTE — Telephone Encounter (Signed)
 Chief Complaint: Right toe injury  Symptoms: Bent toe, red, black, swollen  Disposition: /[x] Urgent Care   Additional Notes: Pt spoke with another nurse for nurse triage today. Review note. Pt states she cancelled her appointment with PCP this afternoon as they do not have x-ray capability. Pt states she scheduled an appointment with an orthopedic doctor tomorrow. This RN recommends pt is seen today and pt is agreeable to go to an urgent care now.   Copied from CRM [id number]. Topic: Clinical - Red Word Triage >> Nov 15, 2023 12:25 PM Geroge Baseman wrote: Red Word that prompted transfer to Nurse Triage: patient is wanting to speak with someone again regarding her hurt toe Reason for Disposition  Looks like a broken bone (e.g., crooked or deformed)  Protocols used: Toe Injury-A-AH

## 2023-11-15 NOTE — Telephone Encounter (Signed)
  Chief Complaint: toe injury  Symptoms: swollen, red, bruising   Disposition: [] ED /[] Urgent Care (no appt availability in office) / [x] Appointment(In office/virtual)/ []  [facility]/ [] Home Care/ [] Refused Recommended Disposition /[] [facility]/ []  Follow-up with PCP Additional Notes: Pt bumped second toe on right on couch last night. Pt buddy tapped it due to "being bent." This morning toe is swollen, red, and bruising at base of toe. Pt is hobbling and can't weight bear. Pt rated pain 5/10. Pt has appt today at 1345. RN gave care advice and pt verbalized understanding.        Copied from CRM [id number]. Topic: Clinical - Red Word Triage >> Nov 15, 2023  9:52 AM Nyra Capes wrote: Red Word that prompted transfer to Nurse Triage: patient calling in. Patient hit her right second toe, buddy taped toe to big toe, toe  is now red with swelling Reason for Disposition  [1] Toe injury AND [2] bad limp or can't wear shoes/sandals  Answer Assessment - Initial Assessment Questions 1. MECHANISM: "How did the injury happen?"      Hit toe on couch 2. ONSET: "When did the injury happen?" (Minutes or hours ago)      Last night  3. LOCATION: "What part of the toe is injured?" "Is the nail damaged?"      Right foot-second toe 4. APPEARANCE of TOE INJURY: "What does the injury look like?"      Red and swollen 5. SEVERITY: "Can you use the foot normally?" "Can you walk?"      Hobbling  6. SIZE: For cuts, bruises, or swelling, ask: "How large is it?" (e.g., inches or centimeters;  entire toe)      Noticeably bigger 7. PAIN: "Is there pain?" If Yes, ask: "How bad is the pain?"   (e.g., Scale 1-10; or mild, moderate, severe)     5  9. DIABETES: "Do you have a history of diabetes or poor circulation in the feet?"     Denies  10. OTHER SYMPTOMS: "Do you have any other symptoms?"        Denies  Protocols used: Toe Injury-A-AH

## 2023-11-15 NOTE — Discharge Instructions (Signed)
 Wear postop shoe and buddy tape toes to assist with healing. Rest, ice, and elevate your foot as often as possible. Otherwise take Tylenol as needed for pain. This should heal on its own. I have attached [facility] that you can follow-up with for further evaluation and management if needed. Return here as needed.

## 2023-11-15 NOTE — ED Triage Notes (Signed)
 Pt states hit her rt 2nd toe on the cough last night. States it was deformed and she popped it back in place. States been icing and buddy taped. C/o bruising and pain.

## 2023-11-15 NOTE — ED Provider Notes (Signed)
 [facility]    CSN: [id number] Arrival date & time: 11/15/23  1251      History   Chief Complaint Chief Complaint  Patient presents with   Toe Injury    HPI [name] is a 73 y.o. female.   Patient presents with right second toe pain after stubbing her toe on the couch last night.  Patient states that the toe was initially deformed and she popped it back in place.  Patient reports she has been icing and buddy taping.  Patient now has swelling and tenderness to right second toe with bruising to top of foot.  Denies any other injuries.     Past Medical History:  Diagnosis Date   Anemia    Anxiety    Arthritis    Complication of anesthesia    past hx. laryngospasm following 2 past surgeries, not recent   Depression    Exertional dyspnea 08/04/2019   GERD (gastroesophageal reflux disease)    Hiatal hernia 10/17/2019   Hyperlipidemia    Insomnia    periodically uses med.   Perforated sigmoid colon (HCC) 04/28/2014   during colonoscopy, "rupture colon"became septic, has colostomy and open surgical wound   Pneumonia    Pre-diabetes    Prediabetes 08/09/2017   Primary hypertension 01/05/2023   Suicide attempt (HCC)    Vitamin D deficiency     Patient Active Problem List   Diagnosis Date Noted   Migraine without aura and without status migrainosus, not intractable 10/16/2023   Lactose intolerance 10/16/2023   Primary hypertension 01/05/2023   Enlarged thyroid 12/24/2022   Myalgia due to statin 09/26/2022   Aortic atherosclerosis (HCC) 09/26/2022   S/P total knee arthroplasty, 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   Vitamin D deficiency 12/02/2020   Postmenopausal 12/02/2020   H/O abdominal hysterectomy 12/02/2020   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   Hiatal hernia 10/17/2019   Exertional dyspnea 08/04/2019   S/P colectomy 04/28/2014   Hyperlipidemia LDL goal <70 07/21/2010   Depression, controlled 07/21/2010   GERD 07/21/2010    Past Surgical History:  Procedure  Laterality Date   ABDOMINAL HYSTERECTOMY  1999   TAH/BSO   APPLICATION OF WOUND VAC  04/28/2014   Procedure: APPLICATION OF WOUND VAC;  Surgeon: Emelia Loron, MD;  Location: [facility];  Service: General;;   BLEPHAROPLASTY  2010   BREAST SURGERY  2001   Breast reduction   COLOSTOMY N/A 04/28/2014   Procedure: COLOSTOMY;  Surgeon: Emelia Loron, MD;  Location: [facility] OR;  Service: General;  Laterality: N/A;   COLOSTOMY REVISION N/A 04/28/2014   Procedure: COLON RESECTION SIGMOID;  Surgeon: Emelia Loron, MD;  Location: [facility];  Service: General;  Laterality: N/A;   COLOSTOMY TAKEDOWN N/A 09/02/2014   Procedure: LAPAROSCOPIC COLOSTOMY REVERSAL;  Surgeon: Romie Levee, MD;  Location: [facility];  Service: General;  Laterality: N/A;   COSMETIC SURGERY     DEBRIDEMENT TENNIS ELBOW     DILATION AND CURETTAGE OF UTERUS     x3, hysteroscopy   ELBOW SURGERY  2007   EYE SURGERY     Blepharoplasty   LAPAROTOMY N/A 04/28/2014   Procedure: EXPLORATORY LAPAROTOMY,SIGMOID COLECTOMY;  Surgeon: Emelia Loron, MD;  Location: [facility] OR;  Service: General;  Laterality: N/A;   ORIF WRIST FRACTURE Left 02/19/2021   Procedure: Left wrist open reduction internal fixation and repair as indicated;  Surgeon: Bradly Bienenstock, MD;  Location: [facility] OR;  Service: Orthopedics;  Laterality: Left;    REDUCTION MAMMAPLASTY Bilateral    scar  and adhesion repair  06/01/15   and liposuction   TOTAL KNEE ARTHROPLASTY Right 06/27/2022   Procedure: RIGHT TOTAL KNEE ARTHROPLASTY;  Surgeon: Marcene Corning, MD;  Location: [facility];  Service: Orthopedics;  Laterality: Right;   ULNAR TUNNEL RELEASE Left 2010    OB History     Gravida  7   Para  2   Term      Preterm      AB      Living  2      SAB      IAB      Ectopic      Multiple      Live Births               Home Medications    Prior to Admission medications   Medication Sig Start Date End Date Taking? Authorizing Provider  amLODipine (NORVASC)  5 MG tablet Take 1 tablet (5 mg total) by mouth daily. 08/30/23 08/24/24  Chilton Si, MD  buPROPion (WELLBUTRIN XL) 150 MG 24 hr tablet Take 1 tablet (150 mg total) by mouth daily. 10/26/23   Mozingo, Thereasa Solo, NP  buPROPion (WELLBUTRIN XL) 300 MG 24 hr tablet Take 1 tablet (300 mg total) by mouth daily. Patient taking differently: Take 450 mg by mouth daily. 07/16/23   Mozingo, Thereasa Solo, NP  eszopiclone (LUNESTA) 2 MG TABS tablet Take 1 tablet (2 mg total) by mouth at bedtime. 11/12/23   Mozingo, Thereasa Solo, NP  Evolocumab (REPATHA SURECLICK) 140 MG/ML SOAJ Inject 140 mg into the skin every 14 (fourteen) days. 08/13/23   Chilton Si, MD  gabapentin (NEURONTIN) 600 MG tablet Take 1 tablet (600 mg total) by mouth every evening. 10/26/23   Mozingo, Thereasa Solo, NP  metoprolol succinate (TOPROL-XL) 25 MG 24 hr tablet Take 1 tablet (25 mg total) by mouth daily. 10/16/23   Ronnald Nian, MD  ondansetron (ZOFRAN-ODT) 4 MG disintegrating tablet Dissolve 1-2 tablets under the tongue every 6 hours as needed 11/12/23   Unk Lightning, PA  pantoprazole (PROTONIX) 40 MG tablet Take 1 tablet (40 mg total) by mouth 2 (two) times daily. 08/15/23   Lynann Bologna, MD  prazosin (MINIPRESS) 2 MG capsule Take 1 capsule (2 mg total) by mouth at bedtime. 02/23/23   Mozingo, Thereasa Solo, NP  prochlorperazine (COMPAZINE) 10 MG tablet Take 1 tablet (10 mg total) by mouth every 8 (eight) hours as needed for nausea or vomiting. Patient not taking: Reported on 10/16/2023 06/23/23   Doristine Mango A, PA-C  topiramate (TOPAMAX) 25 MG tablet Take 3 tablets (75 mg total) by mouth daily. 10/26/23   Mozingo, Thereasa Solo, NP  Vitamin D, Ergocalciferol, (DRISDOL) 1.25 MG (50000 UNIT) CAPS capsule Take 1 capsule by mouth every 7 days. 04/19/23   Jerene Bears, MD    Family History Family History  Problem Relation Age of Onset   Hypertension Mother    Diabetes Mother    Dementia Father     Hyperlipidemia Father    Hypertension Father    Diabetes Father    Cancer Father        History of bladder cancer   Heart disease Father    Other Father        4 ft intestinal removed-had gangrene in gallbladder, then spread to intestines   Heart attack Father    Esophageal cancer Sister    Hyperlipidemia Brother    Heart disease Maternal Grandmother    Stroke Maternal Grandfather  Cancer Paternal Grandmother        uterine   Cancer Paternal Grandfather        pancreatic cancer   Parkinsonism Maternal Uncle    Colon cancer Neg Hx     Social History Social History   Tobacco Use   Smoking status: Former    Current packs/day: 0.00    Types: Cigarettes    Quit date: 09/01/2003    Years since quitting: 20.2   Smokeless tobacco: Never   Tobacco comments:    occasional  Vaping Use   Vaping status: Every Day   Substances: Nicotine, Nicotine-salt  Substance Use Topics   Alcohol use: Yes    Alcohol/week: 14.0 standard drinks of alcohol    Types: 14 Cans of beer per week    Comment: occas.   Drug use: Not Currently    Types: Marijuana     Allergies   Bee venom and Statins   Review of Systems Review of Systems  Per HPI  Physical Exam Triage Vital Signs ED Triage Vitals [11/15/23 1343]  Encounter Vitals Group     BP 136/86     Systolic BP Percentile      Diastolic BP Percentile      Pulse Rate 84     Resp 18     Temp 98.4 F (36.9 C)     Temp Source Oral     SpO2 98 %     Weight      Height      Head Circumference      Peak Flow      Pain Score 5     Pain Loc      Pain Education      Exclude from Growth Chart    No data found.  Updated Vital Signs BP 136/86 (BP Location: Right Arm)   Pulse 84   Temp 98.4 F (36.9 C) (Oral)   Resp 18   LMP 08/07/1998   SpO2 98%   Visual Acuity Right Eye Distance:   Left Eye Distance:   Bilateral Distance:    Right Eye Near:   Left Eye Near:    Bilateral Near:     Physical Exam Vitals and nursing  note reviewed.  Constitutional:      General: She is awake. She is not in acute distress.    Appearance: Normal appearance. She is well-developed and well-groomed. She is not ill-appearing.  Musculoskeletal:     Right foot: Swelling, tenderness and bony tenderness present.     Comments: Swelling and tenderness noted to the right second toe.  Skin:    General: Skin is warm and dry.     Findings: Bruising present.     Comments: Bruising to dorsal aspect of forefoot at base of 2nd and 3rd toe.  Neurological:     Mental Status: She is alert.  Psychiatric:        Behavior: Behavior is cooperative.      UC Treatments / Results  Labs (all labs ordered are listed, but only abnormal results are displayed) Labs Reviewed - No data to display  EKG   Radiology DG Foot Complete Right Result Date: 11/15/2023 CLINICAL DATA:  Foot pain.  Trauma EXAM: RIGHT FOOT COMPLETE - 3 VIEW COMPARISON:  None Available. FINDINGS: There is a displaced fracture of the distal aspect of the proximal phalanx of the second digit with fracture lines extending into the adjacent proximal interphalangeal joint. No additional fracture or dislocation. Preserved bone mineralization. Mild degenerative  changes of the first metatarsophalangeal joint. Small well corticated plantar Achilles calcaneal spur. IMPRESSION: displaced fracture of the distal aspect of the proximal phalanx of the second digit with fracture lines extending into the adjacent proximal interphalangeal joint. Electronically Signed   By: Karen Kays M.D.   On: 11/15/2023 16:37    Procedures Procedures (including critical care time)  Medications Ordered in UC Medications - No data to display  Initial Impression / Assessment and Plan / UC Course  I have reviewed the triage vital signs and the nursing notes.  Pertinent labs & imaging results that were available during my care of the patient were reviewed by me and considered in my medical decision making  (see chart for details).     Based on my interpretation of x-ray there is a fracture to the second proximal phalanx of the right foot.  Radiology report confirms displaced fracture of the distal aspect of the proximal phalanx of the second digit. Given postop shoe and buddy taped toes.  Discussed care of toe fracture.  Given Ortho follow-up if needed.  Discussed return precautions. Final Clinical Impressions(s) / UC Diagnoses   Final diagnoses:  Right foot pain  Closed displaced fracture of phalanx of toe of right foot, unspecified toe, initial encounter     Discharge Instructions      Wear postop shoe and buddy tape toes to assist with healing. Rest, ice, and elevate your foot as often as possible. Otherwise take Tylenol as needed for pain. This should heal on its own. I have attached [facility] that you can follow-up with for further evaluation and management if needed. Return here as needed.    ED Prescriptions   None    PDMP not reviewed this encounter.   Wynonia Lawman A, NP 11/15/23 1714

## 2023-11-22 ENCOUNTER — Ambulatory Visit
Admission: RE | Admit: 2023-11-22 | Discharge: 2023-11-22 | Disposition: A | Discharge status: Home / Self Care | Source: Ambulatory Visit | Attending: Obstetrics & Gynecology | Admitting: Obstetrics & Gynecology

## 2023-11-22 DIAGNOSIS — Z1231 Encounter for screening mammogram for malignant neoplasm of breast: Secondary | ICD-10-CM | POA: Diagnosis not present

## 2023-11-24 ENCOUNTER — Other Ambulatory Visit (HOSPITAL_BASED_OUTPATIENT_CLINIC_OR_DEPARTMENT_OTHER): Payer: Self-pay

## 2023-11-26 DIAGNOSIS — M79671 Pain in right foot: Secondary | ICD-10-CM | POA: Diagnosis not present

## 2023-11-28 ENCOUNTER — Other Ambulatory Visit (HOSPITAL_BASED_OUTPATIENT_CLINIC_OR_DEPARTMENT_OTHER): Payer: Self-pay

## 2023-11-28 ENCOUNTER — Telehealth: Payer: Self-pay | Admitting: Cardiovascular Disease

## 2023-11-28 ENCOUNTER — Other Ambulatory Visit (HOSPITAL_COMMUNITY): Payer: Self-pay

## 2023-11-28 ENCOUNTER — Telehealth: Payer: Self-pay

## 2023-11-28 ENCOUNTER — Other Ambulatory Visit: Payer: Self-pay

## 2023-11-28 NOTE — Telephone Encounter (Signed)
 Pt c/o medication issue:  1. Name of Medication:   Evolocumab  (REPATHA  SURECLICK) 140 MG/ML SOAJ    2. How are you currently taking this medication (dosage and times per day)? As written   3. Are you having a reaction (difficulty breathing--STAT)? No   4. What is your medication issue? Pt states she went to refill this med and it was higher than usual. She states pharmacy told her to call and ask her for patient assistance. Please advise.

## 2023-11-28 NOTE — Telephone Encounter (Signed)
 Patient Advocate Encounter   The patient was approved for a [employer] grant that will help cover the cost of REPATHA  Total amount awarded, $2,500.  Effective: 10/29/23 - 10/27/24   [id number] [id number]   Pharmacy provided with approval and processing information. Patient informed via Tilman Fonder, CPhT  Pharmacy Patient Advocate Specialist  Direct Number: [phone] Fax: [phone]

## 2023-11-28 NOTE — Telephone Encounter (Signed)
 [name] renewed and pt made aware via mychart. Please see separate encounter for details

## 2023-11-29 ENCOUNTER — Other Ambulatory Visit (HOSPITAL_BASED_OUTPATIENT_CLINIC_OR_DEPARTMENT_OTHER): Payer: Self-pay

## 2023-12-04 ENCOUNTER — Other Ambulatory Visit (HOSPITAL_BASED_OUTPATIENT_CLINIC_OR_DEPARTMENT_OTHER): Payer: Self-pay

## 2023-12-06 ENCOUNTER — Other Ambulatory Visit (HOSPITAL_BASED_OUTPATIENT_CLINIC_OR_DEPARTMENT_OTHER): Payer: Self-pay

## 2023-12-06 MED ORDER — CAPVAXIVE 0.5 ML IM SOSY
PREFILLED_SYRINGE | INTRAMUSCULAR | 0 refills | Status: DC
  Filled 2023-12-06: qty 0.5, 1d supply, fill #0

## 2023-12-17 DIAGNOSIS — H10411 Chronic giant papillary conjunctivitis, right eye: Secondary | ICD-10-CM | POA: Diagnosis not present

## 2023-12-21 DIAGNOSIS — M79674 Pain in right toe(s): Secondary | ICD-10-CM | POA: Diagnosis not present

## 2024-01-01 ENCOUNTER — Other Ambulatory Visit (HOSPITAL_BASED_OUTPATIENT_CLINIC_OR_DEPARTMENT_OTHER): Payer: Self-pay

## 2024-01-01 MED ORDER — VALACYCLOVIR HCL 1 G PO TABS
2000.0000 mg | ORAL_TABLET | Freq: Two times a day (BID) | ORAL | 6 refills | Status: AC
  Filled 2024-01-01: qty 4, 1d supply, fill #0
  Filled 2024-02-04: qty 4, 1d supply, fill #1
  Filled 2024-02-10: qty 4, 1d supply, fill #2

## 2024-01-03 ENCOUNTER — Ambulatory Visit (HOSPITAL_BASED_OUTPATIENT_CLINIC_OR_DEPARTMENT_OTHER): Admitting: Obstetrics & Gynecology

## 2024-01-03 ENCOUNTER — Encounter (HOSPITAL_BASED_OUTPATIENT_CLINIC_OR_DEPARTMENT_OTHER): Payer: Self-pay | Admitting: Obstetrics & Gynecology

## 2024-01-03 VITALS — BP 128/68 | HR 70 | Ht 65.5 in | Wt 142.0 lb

## 2024-01-03 DIAGNOSIS — I10 Essential (primary) hypertension: Secondary | ICD-10-CM | POA: Diagnosis not present

## 2024-01-03 DIAGNOSIS — E2839 Other primary ovarian failure: Secondary | ICD-10-CM

## 2024-01-03 DIAGNOSIS — R232 Flushing: Secondary | ICD-10-CM | POA: Diagnosis not present

## 2024-01-03 DIAGNOSIS — Z9189 Other specified personal risk factors, not elsewhere classified: Secondary | ICD-10-CM | POA: Diagnosis not present

## 2024-01-03 DIAGNOSIS — R7303 Prediabetes: Secondary | ICD-10-CM

## 2024-01-03 DIAGNOSIS — B009 Herpesviral infection, unspecified: Secondary | ICD-10-CM | POA: Diagnosis not present

## 2024-01-03 DIAGNOSIS — Z9049 Acquired absence of other specified parts of digestive tract: Secondary | ICD-10-CM | POA: Diagnosis not present

## 2024-01-03 DIAGNOSIS — G43009 Migraine without aura, not intractable, without status migrainosus: Secondary | ICD-10-CM

## 2024-01-03 LAB — HEMOGLOBIN A1C
Est. average glucose Bld gHb Est-mCnc: 108 mg/dL
Hgb A1c MFr Bld: 5.4 % (ref 4.8–5.6)

## 2024-01-03 NOTE — Progress Notes (Signed)
 Breast and pelvic exa, Patient name: [name] MRN [id number]  Date of birth: 07-05-1951 Chief Complaint:   Breast and pelvic  History of Present Illness:   [name] is a 73 y.o. G76P2 Caucasian female being seen today for a routine annual exam.  Stubbed her toe about six weeks ago and broke it.  Buddy wrapped it.  Had knee replacement 06/27/2022 with Dr. Dalldorf.  She doesn't have as good of balance as she had before the surgery so did PT for this.  Her broken toe has complicated the balance and PT work a bit.  May need to do this again.    Has significant issues with diarrhea last year.  Lost around 20 pounds due to this.  Had full evaluation that didn't really show any causes.  Some medication changes were made and ultimately it all resolved.    Denies vaginal bleeding.    Having a lot of issues with night sweats now.  This is new.  She can wake up at night and be drenched.  Now, she is needing to wear deodorant.    Patient's last menstrual period was 08/07/1998.  Last pap . Not indicated due to hx of hysterectomy Last mammogram: 11/22/2023 . Results were: normal. Family h/o breast cancer: no Last colonoscopy: 08/18/2014  . Results were: normal but had colon perforation.  Family h/o colorectal cancer: no DEXA: 2019     10/09/2023    3:41 PM 10/03/2022    3:02 PM 05/08/2022   11:31 AM 04/03/2022    4:14 PM 03/02/2022    2:16 PM  Depression screen PHQ 2/9  Decreased Interest 3 0 0 0 0  Down, Depressed, Hopeless 3 3 0 0 0  PHQ - 2 Score 6 3 0 0 0  Altered sleeping 0 0     Tired, decreased energy 0 0     Change in appetite 3 0     Feeling bad or failure about yourself  2 3     Trouble concentrating 1 0     Moving slowly or fidgety/restless 0 0     Suicidal thoughts 0 0     PHQ-9 Score 12 6     Difficult doing work/chores Somewhat difficult Not difficult at all       Review of Systems:   Pertinent items are noted in HPI Denies any headaches, blurred vision,  fatigue, shortness of breath, chest pain, abdominal pain, abnormal vaginal discharge/itching/odor/irritation, problems with periods, bowel movements, urination, or intercourse unless otherwise stated above. Pertinent History Reviewed:  Reviewed past medical,surgical, social and family history.  Reviewed problem list, medications and allergies. Physical Assessment:   Vitals:   01/03/24 1101  BP: 128/68  Pulse: 70  Weight: 142 lb (64.4 kg)  Height: 5' 5.5" (1.664 m)  Body mass index is 23.27 kg/m.        Physical Examination:   General appearance - well appearing, and in no distress  Mental status - alert, oriented to person, place, and time  Psych:  She has a normal mood and affect  Skin - warm and dry, normal color, no suspicious lesions noted  Chest - effort normal, all lung fields clear to auscultation bilaterally  Heart - normal rate and regular rhythm  Neck:  midline trachea, no thyromegaly or nodules  Breasts - breasts appear normal, no suspicious masses, no skin or nipple changes or  axillary nodes  Abdomen - soft, nontender, nondistended, no masses or organomegaly  Pelvic -  VULVA: normal appearing vulva with no masses, tenderness or lesions   VAGINA: normal appearing vagina with normal color and discharge, no lesions   CERVIX: surgically absent  UTERUS: surgically absent  ADNEXA: No adnexal masses or tenderness noted.  Rectal - normal rectal, good sphincter tone, no masses felt.   Extremities:  No swelling or varicosities noted  Chaperone present for exam  Assessment & Plan:  1. GYN exam for high-risk Medicare patient (Primary) - Pap smear not indicated due to hysterectomy - Mammogram 11/22/2023 - Colonoscopy 08/18/2014, normal.  Pt is not going to proceed with another colonoscopy, however. - Bone mineral density 2019 - screening blood work done with PCP - vaccines reviewed/updated  2. Hot flashes - CBC with Differential/Platelet - Comprehensive metabolic panel with  GFR - TSH - HIV antibody (with reflex) - C-reactive protein - QFT-TB Plus (Client Incubated)  3. Hypoestrogenism - DG BONE DENSITY (DXA); Future  4. Prediabetes - Hemoglobin A1c  5. 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 Migraine without aura and without status migrainosus, not intractable  7. Primary hypertension  8. S/P colectomy    Orders Placed This Encounter  Procedures   DG BONE DENSITY (DXA)   CBC with Differential/Platelet   Comprehensive metabolic panel with GFR   TSH   HIV antibody (with reflex)   C-reactive protein   QFT-TB Plus (Client Incubated)   Hemoglobin A1c    Meds: No orders of the defined types were placed in this encounter.   Follow-up: Return in about 1 year (around 01/02/2025).  Lillian Rein, MD 01/06/2024 9:32 PM

## 2024-01-04 ENCOUNTER — Ambulatory Visit (HOSPITAL_BASED_OUTPATIENT_CLINIC_OR_DEPARTMENT_OTHER): Payer: Self-pay | Admitting: Obstetrics & Gynecology

## 2024-01-04 DIAGNOSIS — R7989 Other specified abnormal findings of blood chemistry: Secondary | ICD-10-CM

## 2024-01-06 ENCOUNTER — Encounter (HOSPITAL_BASED_OUTPATIENT_CLINIC_OR_DEPARTMENT_OTHER): Payer: Self-pay | Admitting: Obstetrics & Gynecology

## 2024-01-06 LAB — COMPREHENSIVE METABOLIC PANEL WITH GFR
ALT: 17 IU/L (ref 0–32)
AST: 25 IU/L (ref 0–40)
Albumin: 4.4 g/dL (ref 3.8–4.8)
Alkaline Phosphatase: 67 IU/L (ref 44–121)
BUN/Creatinine Ratio: 10 — ABNORMAL LOW (ref 12–28)
BUN: 10 mg/dL (ref 8–27)
Bilirubin Total: 0.5 mg/dL (ref 0.0–1.2)
CO2: 21 mmol/L (ref 20–29)
Calcium: 9.5 mg/dL (ref 8.7–10.3)
Chloride: 97 mmol/L (ref 96–106)
Creatinine, Ser: 1.03 mg/dL — ABNORMAL HIGH (ref 0.57–1.00)
Globulin, Total: 2.4 g/dL (ref 1.5–4.5)
Glucose: 101 mg/dL — ABNORMAL HIGH (ref 70–99)
Potassium: 4.4 mmol/L (ref 3.5–5.2)
Sodium: 135 mmol/L (ref 134–144)
Total Protein: 6.8 g/dL (ref 6.0–8.5)
eGFR: 58 mL/min/{1.73_m2} — ABNORMAL LOW (ref >59)

## 2024-01-06 LAB — TSH: TSH: 4.08 u[IU]/mL (ref 0.450–4.500)

## 2024-01-06 LAB — QFT-TB PLUS (CLIENT INCUBATED)
QuantiFERON Mitogen Value: >10 [IU]/mL
QuantiFERON Nil Value: 0.01 [IU]/mL
QuantiFERON TB1 Ag Value: 0.01 [IU]/mL
QuantiFERON TB2 Ag Value: 0.02 [IU]/mL
QuantiFERON-TB Gold Plus: NEGATIVE

## 2024-01-06 LAB — CBC WITH DIFFERENTIAL/PLATELET
Basophils Absolute: 0.1 10*3/uL (ref 0.0–0.2)
Basos: 1 %
EOS (ABSOLUTE): 0.2 10*3/uL (ref 0.0–0.4)
Eos: 4 %
Hematocrit: 39.2 % (ref 34.0–46.6)
Hemoglobin: 12.9 g/dL (ref 11.1–15.9)
Immature Grans (Abs): 0 10*3/uL (ref 0.0–0.1)
Immature Granulocytes: 0 %
Lymphocytes Absolute: 1.7 10*3/uL (ref 0.7–3.1)
Lymphs: 26 %
MCH: 32.5 pg (ref 26.6–33.0)
MCHC: 32.9 g/dL (ref 31.5–35.7)
MCV: 99 fL — ABNORMAL HIGH (ref 79–97)
Monocytes Absolute: 0.4 10*3/uL (ref 0.1–0.9)
Monocytes: 7 %
Neutrophils Absolute: 4 10*3/uL (ref 1.4–7.0)
Neutrophils: 62 %
Platelets: 300 10*3/uL (ref 150–450)
RBC: 3.97 x10E6/uL (ref 3.77–5.28)
RDW: 11.8 % (ref 11.7–15.4)
WBC: 6.4 10*3/uL (ref 3.4–10.8)

## 2024-01-06 LAB — HIV ANTIBODY (ROUTINE TESTING W REFLEX): HIV Screen 4th Generation wRfx: NONREACTIVE

## 2024-01-06 LAB — C-REACTIVE PROTEIN: CRP: <1 mg/L (ref 0–10)

## 2024-01-08 DIAGNOSIS — H04123 Dry eye syndrome of bilateral lacrimal glands: Secondary | ICD-10-CM | POA: Diagnosis not present

## 2024-01-14 DIAGNOSIS — M79674 Pain in right toe(s): Secondary | ICD-10-CM | POA: Diagnosis not present

## 2024-01-22 ENCOUNTER — Encounter (HOSPITAL_BASED_OUTPATIENT_CLINIC_OR_DEPARTMENT_OTHER): Payer: Self-pay | Admitting: Cardiovascular Disease

## 2024-01-22 ENCOUNTER — Ambulatory Visit (HOSPITAL_BASED_OUTPATIENT_CLINIC_OR_DEPARTMENT_OTHER): Payer: PPO | Admitting: Cardiovascular Disease

## 2024-01-22 VITALS — BP 98/70 | HR 75 | Ht 66.0 in | Wt 138.5 lb

## 2024-01-22 DIAGNOSIS — E785 Hyperlipidemia, unspecified: Secondary | ICD-10-CM

## 2024-01-22 DIAGNOSIS — I700 Atherosclerosis of aorta: Secondary | ICD-10-CM | POA: Diagnosis not present

## 2024-01-22 DIAGNOSIS — I10 Essential (primary) hypertension: Secondary | ICD-10-CM | POA: Diagnosis not present

## 2024-01-22 NOTE — Progress Notes (Signed)
 Cardiology Office Note:  .   Date:  01/22/2024  ID:  [name], DOB 1951-04-17, MRN [id number] PCP: Watson Hacking, MD  [facility] Providers Cardiologist:  Maudine Sos, MD    History of Present Illness: [name] is a 73 y.o. female with familial hyperlipidemia, aortic atherosclerosis, prediabetes, and GERD who presents for follow up.  She was initially seen 07/2019 for management of hyperlipidemia.  [name] reports a history of hyperlipidemia but has been unable to tolerate atorvastatin or rosuvastatin  due to myalgias.  She reported exertional dyspnea so she was referred for coronary CT-A 08/2019 that revealed normal coronaries and a calcium  score of 0.  However she did have a moderate hiatal hernia.  She had a CT 06/2022 that revealed aortic atherosclerosis.  She was started on Repatha .  At her visit 12/2022 she noted an increase in her BP that was stress related.  Her depression was worse after stopping Lamictal .    Discussed the use of AI scribe software for clinical note transcription with the patient, who gave verbal consent to proceed.  History of Present Illness [name] experienced severe diarrhea for several months, leading to significant weight loss from the high 160s to 170 pounds down to 138 pounds. The diarrhea was constant, with episodes occurring even when she thought she was only passing gas, causing her to fear leaving the house due to potential incontinence. This condition severely restricted her diet, allowing her to eat only a few bites of food a day, contributing to her weight loss.  She underwent extensive testing, including stool tests and a CT scan of the abdomen, but no definitive diagnosis was made. The diarrhea has since resolved, but her appetite remains poor. She eats only about a third of her meals and describes her current eating habits as similar to those of a '[age] year old.'  She reports low energy levels, which she  attributes to her low blood pressure. Previously active, going to the gym three times a week, she now lacks the energy to maintain this routine. She is currently taking metoprolol  and amlodipine  for blood pressure management. No palpitations or heart racing have been experienced.  Additionally, she mentions a broken toe that has not healed after eight weeks, further impacting her ability to exercise. She is also on prazosin  for sleep paralysis and nightmares, which she has not experienced in a long time. Her cholesterol levels are well-controlled with Repatha .  ROS:  As per HPI  Studies Reviewed: Aaron Aas   EKG Interpretation Date/Time:  Tuesday January 22 2024 09:24:44 EDT Ventricular Rate:  75 PR Interval:  160 QRS Duration:  80 QT Interval:  356 QTC Calculation: 397 R Axis:   70  Text Interpretation: Normal sinus rhythm Normal ECG When compared with ECG of 16-Sep-2022 13:19, Vent. rate has decreased BY  41 BPM T wave amplitude has decreased in Inferior leads Confirmed by Maudine Sos ([id number]) on 01/22/2024 9:46:06 AM    EKG Interpretation Date/Time:  Tuesday January 22 2024 09:24:44 EDT Ventricular Rate:  75 PR Interval:  160 QRS Duration:  80 QT Interval:  356 QTC Calculation: 397 R Axis:   70  Text Interpretation: Normal sinus rhythm Normal ECG When compared with ECG of 16-Sep-2022 13:19, Vent. rate has decreased BY  41 BPM T wave amplitude has decreased in Inferior leads Confirmed by Maudine Sos ([id number]) on 01/22/2024 9:46:06 AM        Cardiac CT 08/2019: IMPRESSION: 1.  Coronary calcium  score of 0. This was 0 percentile for age and sex matched control.   2. Normal coronary origin with right dominance.   3. No evidence of CAD.  Risk Assessment/Calculations:             Physical Exam:   VS:  BP 98/70 (BP Location: Left Arm, Patient Position: Sitting, Cuff Size: Normal)   Pulse 75   Ht 5' 6 (1.676 m)   Wt 138 lb 8 oz (62.8 kg)   LMP 08/07/1998   SpO2 96%   BMI 22.35  kg/m  , BMI Body mass index is 22.35 kg/m. GENERAL:  Well appearing HEENT: Pupils equal round and reactive, fundi not visualized, oral mucosa unremarkable NECK:  No jugular venous distention, waveform within normal limits, carotid upstroke brisk and symmetric, no bruits, no thyromegaly LUNGS:  Clear to auscultation bilaterally HEART:  RRR.  PMI not displaced or sustained,S1 and S2 within normal limits, no S3, no S4, no clicks, no rubs, no murmurs ABD:  Flat, positive bowel sounds normal in frequency in pitch, no bruits, no rebound, no guarding, no midline pulsatile mass, no hepatomegaly, no splenomegaly EXT:  2 plus pulses throughout, no edema, no cyanosis no clubbing SKIN:  No rashes no nodules NEURO:  Cranial nerves II through XII grossly intact, motor grossly intact throughout PSYCH:  Cognitively intact, oriented to person place and time   ASSESSMENT AND PLAN: .    Assessment & Plan # Aortic atherosclerosis: # Hyperlipidemia:  Lipids are now very well-controlled.  Continue Repatha .  # Hypotension Likely secondary to significant weight loss and possibly exacerbated by metoprolol  and amlodipine . No palpitations or tachycardia. Discontinuation may improve energy. - Discontinue metoprolol  and amlodipine . - Monitor blood pressure twice daily for two weeks, ensuring it remains under 130/80 mmHg on average. - Submit blood pressure readings via MyChart after two weeks.  # Weight loss Significant unintentional weight loss from 170 lbs to 138 lbs over the past year, initially due to severe diarrhea and reduced appetite.       Dispo: f/u 1 year  Signed, Maudine Sos, MD

## 2024-01-22 NOTE — Patient Instructions (Signed)
 Medication Instructions:  STOP AMLODIPINE    STOP METOPROLOL    *If you need a refill on your cardiac medications before your next appointment, please call your pharmacy*  Lab Work: NONE   Testing/Procedures: NONE  Follow-Up: At [facility], you and your health needs are our priority.  As part of our continuing mission to provide you with exceptional heart care, our providers are all part of one team.  This team includes your primary Cardiologist (physician) and Advanced Practice Providers or APPs (Physician Assistants and Nurse Practitioners) who all work together to provide you with the care you need, when you need it.  Your next appointment:   12 month(s)  Provider:   Maudine Sos, MD, Slater Duncan, NP, or Neomi Banks, NP   We recommend signing up for the patient portal called MyChart.  Sign up information is provided on this After Visit Summary.  MyChart is used to connect with patients for Virtual Visits (Telemedicine).  Patients are able to view lab/test results, encounter notes, upcoming appointments, etc.  Non-urgent messages can be sent to your provider as well.   To learn more about what you can do with MyChart, go to [email or web address].   Other Instructions MONITOR BLOOD PRESSURE DAILY AND SEND MYCHART MESSAGE IN COUPLE WEEKS WITH UPDATED READINGS

## 2024-01-23 ENCOUNTER — Other Ambulatory Visit: Payer: Self-pay

## 2024-01-23 ENCOUNTER — Other Ambulatory Visit (HOSPITAL_BASED_OUTPATIENT_CLINIC_OR_DEPARTMENT_OTHER): Payer: Self-pay

## 2024-01-23 MED ORDER — DIAZEPAM 5 MG PO TABS
5.0000 mg | ORAL_TABLET | ORAL | 0 refills | Status: DC
  Filled 2024-01-23: qty 4, 2d supply, fill #0

## 2024-01-24 ENCOUNTER — Other Ambulatory Visit (HOSPITAL_BASED_OUTPATIENT_CLINIC_OR_DEPARTMENT_OTHER): Payer: Self-pay

## 2024-01-24 ENCOUNTER — Ambulatory Visit: Admitting: Adult Health

## 2024-01-24 ENCOUNTER — Encounter: Payer: Self-pay | Admitting: Adult Health

## 2024-01-24 DIAGNOSIS — G4700 Insomnia, unspecified: Secondary | ICD-10-CM | POA: Diagnosis not present

## 2024-01-24 DIAGNOSIS — F331 Major depressive disorder, recurrent, moderate: Secondary | ICD-10-CM

## 2024-01-24 MED ORDER — ESZOPICLONE 2 MG PO TABS
2.0000 mg | ORAL_TABLET | Freq: Every evening | ORAL | 0 refills | Status: DC
  Filled 2024-01-24 – 2024-02-13 (×2): qty 90, 90d supply, fill #0

## 2024-01-24 MED ORDER — GABAPENTIN 600 MG PO TABS
600.0000 mg | ORAL_TABLET | Freq: Every evening | ORAL | 0 refills | Status: DC
  Filled 2024-01-24 – 2024-02-22 (×2): qty 90, 90d supply, fill #0

## 2024-01-24 MED ORDER — PRAZOSIN HCL 2 MG PO CAPS
2.0000 mg | ORAL_CAPSULE | Freq: Every day | ORAL | 0 refills | Status: DC
  Filled 2024-01-24 – 2024-03-03 (×2): qty 90, 90d supply, fill #0

## 2024-01-24 NOTE — Progress Notes (Addendum)
 [name] [id number] July 31, 1951 73 y.o.  Subjective:   Patient ID:  [name] is a 73 y.o. (DOB [birth year]) female.  Chief Complaint: No chief complaint on file.   HPI [name] presents to the office today for follow-up of MDD and insomnia.   Describes mood today as ok. Pleasant. Reports decreased tearfulness. Mood symptoms - reports decreased depression.  Denies anxiety and irritability. Reports she lacks interest and motivation. Denies panic attacks. Reports some worry, rumination  and over thinking. Mood is consistent. Stating overall, I feel like I'm doing ok. Feels like current medications are helpful. Taking medications as prescribed.   Energy levels lower. Active, does not have a regular exercise routine. Enjoys some usual interests and activities. Lives alone - 2 cats. Has 2 children son 71 [name], daughter 75 in [place] and 2 grandchildren.  Appetite adequate. Weight loss - 138 pounds. Sleeps well most nights. Averages 8 hours. Focus and concentration stable. Completing tasks. Managing aspects of household. Retired. Denies SI or HI.  Denies AH or VH. Denies self harm. Denies substance use.  Reports alcohol use.  Previous medication trials: Zoloft, Lamictal  - low sodium, Celexa , Wellbutrin , Lexapro, Effexor , Vraylar   AUDIT    Flowsheet Row Office Visit from 10/16/2023 in [facility]  Alcohol Use Disorder Identification Test Final Score (AUDIT) 3    PHQ2-9    Flowsheet Row Clinical Support from 10/09/2023 in [facility] Clinical Support from 10/03/2022 in [facility] Office Visit from 05/08/2022 in [facility] for [facility] at [facility] Office Visit from 04/03/2022 in [facility] for [facility] at [facility] Office Visit from 03/02/2022 in [facility] for [facility] at [facility] Total Score 6 3 0 0 0  PHQ-9 Total Score 12 6 -- -- --    Flowsheet Row UC from 11/15/2023 in [facility] at [facility] UC from 06/23/2023 in [facility] UC from 09/16/2022 in [facility] at [facility] RISK CATEGORY No Risk No Risk No Risk     Review of Systems:  Review of Systems  Musculoskeletal:  Negative for gait problem.  Neurological:  Negative for tremors.  Psychiatric/Behavioral:         Please refer to HPI    Medications: I have reviewed the patient's current medications.  Current Outpatient Medications  Medication Sig Dispense Refill   buPROPion  (WELLBUTRIN  XL) 150 MG 24 hr tablet Take 1 tablet (150 mg total) by mouth daily. 90 tablet 3   buPROPion  (WELLBUTRIN  XL) 300 MG 24 hr tablet Take 1 tablet (300 mg total) by mouth daily. 90 tablet 3   diazepam (VALIUM) 5 MG tablet Take 1 tablet (5 mg total) by mouth before bed and 1 tablet 1 hour before appointment. 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 4 tablet 0   eszopiclone  (LUNESTA ) 2 MG TABS tablet Take 1 tablet (2 mg total) by mouth at bedtime. 90 tablet 0   Evolocumab  (REPATHA  SURECLICK) 140 MG/ML SOAJ Inject 140 mg into the skin every 14 (fourteen) days. 6 mL 3   gabapentin  (NEURONTIN ) 600 MG tablet Take 1 tablet (600 mg total) by mouth every evening. 90 tablet 1   ondansetron  (ZOFRAN -ODT) 4 MG disintegrating tablet Dissolve 1-2 tablets under the tongue every 6 hours as needed 30 tablet 2   pantoprazole  (PROTONIX ) 40 MG tablet Take 1 tablet (40 mg total) by mouth 2 (two) times daily.  180 tablet 3   prazosin  (MINIPRESS ) 2 MG capsule Take 1 capsule (2 mg total) by mouth at bedtime. 90 capsule 3   topiramate  (TOPAMAX ) 25 MG tablet Take 50 mg by mouth daily.     valACYclovir  (VALTREX ) 1000 MG tablet Take 2 tablets (2,000 mg total) by mouth at first sign of outbreak and then 2 tablets 12 hours later. 4 tablet 6   Vitamin D , Ergocalciferol , (DRISDOL ) 1.25 MG (50000 UNIT) CAPS capsule Take 1 capsule by mouth every 7 days. 12  capsule 3   No current facility-administered medications for this visit.    Medication Side Effects: None  Allergies:  Allergies  Allergen Reactions   Bee Venom Swelling    Yellow jackets   Statins Other (See Comments)    Muscle pains  CRESTOR , LIPITOR    Past Medical History:  Diagnosis Date   Anemia    Anxiety    Arthritis    Complication of anesthesia    past hx. laryngospasm following 2 past surgeries, not recent   Depression    Exertional dyspnea 08/04/2019   GERD (gastroesophageal reflux disease)    Hiatal hernia 10/17/2019   Hyperlipidemia    Insomnia    periodically uses med.   Perforated sigmoid colon (HCC) 04/28/2014   during colonoscopy, rupture colonbecame septic, has colostomy and open surgical wound   Pneumonia    Pre-diabetes    Prediabetes 08/09/2017   Primary hypertension 01/05/2023   Suicide attempt (HCC)    Vitamin D  deficiency     Past Medical History, Surgical history, Social history, and Family history were reviewed and updated as appropriate.   Please see review of systems for further details on the patient's review from today.   Objective:   Physical Exam:  LMP 08/07/1998   Physical Exam Constitutional:      General: She is not in acute distress.  Musculoskeletal:        General: No deformity.   Neurological:     Mental Status: She is alert and oriented to person, place, and time.     Coordination: Coordination normal.   Psychiatric:        Attention and Perception: Attention and perception normal. She does not perceive auditory or visual hallucinations.        Mood and Affect: Mood normal. Mood is not anxious or depressed. Affect is not labile, blunt, angry or inappropriate.        Speech: Speech normal.        Behavior: Behavior normal.        Thought Content: Thought content normal. Thought content is not paranoid or delusional. Thought content does not include homicidal or suicidal ideation. Thought content does not  include homicidal or suicidal plan.        Cognition and Memory: Cognition and memory normal.        Judgment: Judgment normal.     Comments: Insight intact     Lab Review:     Component Value Date/Time   NA 135 01/03/2024 1156   K 4.4 01/03/2024 1156   CL 97 01/03/2024 1156   CO2 21 01/03/2024 1156   GLUCOSE 101 (H) 01/03/2024 1156   GLUCOSE 108 (H) 05/23/2023 1436   BUN 10 01/03/2024 1156   CREATININE 1.03 (H) 01/03/2024 1156   CREATININE 0.99 08/08/2017 1629   CALCIUM  9.5 01/03/2024 1156   PROT 6.8 01/03/2024 1156   ALBUMIN 4.4 01/03/2024 1156   AST 25 01/03/2024 1156   ALT 17 01/03/2024 1156  ALKPHOS 67 01/03/2024 1156   BILITOT 0.5 01/03/2024 1156   GFRNONAA 43 (L) 09/16/2022 1415   GFRNONAA 76 04/14/2014 1427   GFRAA 72 08/27/2019 1112   GFRAA 88 04/14/2014 1427       Component Value Date/Time   WBC 6.4 01/03/2024 1156   WBC 7.9 05/23/2023 1436   RBC 3.97 01/03/2024 1156   RBC 4.50 05/23/2023 1436   HGB 12.9 01/03/2024 1156   HGB 11.9 (L) 06/14/2015 1528   HCT 39.2 01/03/2024 1156   PLT 300 01/03/2024 1156   MCV 99 (H) 01/03/2024 1156   MCH 32.5 01/03/2024 1156   MCH 31.8 09/16/2022 1415   MCHC 32.9 01/03/2024 1156   MCHC 33.0 05/23/2023 1436   RDW 11.8 01/03/2024 1156   LYMPHSABS 1.7 01/03/2024 1156   MONOABS 0.5 05/23/2023 1436   EOSABS 0.2 01/03/2024 1156   BASOSABS 0.1 01/03/2024 1156    No results found for: POCLITH, LITHIUM   No results found for: PHENYTOIN, PHENOBARB, VALPROATE, CBMZ   .res Assessment: Plan:    Treatment Plan/Recommendations:   Plan:  PDMP reviewed  Will continue: Gabapentin  600mg  at hs Minipress  2mg  at hs Lunesta  2mg  at hs Wellbutrin  XL 450mg  daily - denies seizure history. Topamax  25mg  - 2 at hs  RTC 3 months  25 minutes spent dedicated to the care of this patient on the date of this encounter to include pre-visit review of records, ordering of medication, post visit documentation, and  face-to-face time with the patient discussing MDD and insomnia. Discussed continuing current medication regimen.  Patient advised to contact office with any questions, adverse effects, or acute worsening in signs and symptoms.   There are no diagnoses linked to this encounter.   Please see After Visit Summary for patient specific instructions.  Future Appointments  Date Time Provider Department Center  01/28/2024  2:00 PM Orbie Binder [facility]  02/01/2024  1:50 PM Merriam Abbey, DO [facility] None  10/14/2024  3:30 PM [facility]-ANNUAL WELLNESS VISIT [facility]  10/16/2024  2:00 PM Jude Norton, MD [facility]  01/12/2025  1:55 PM Lillian Rein, MD [facility]    No orders of the defined types were placed in this encounter.   -------------------------------

## 2024-01-25 ENCOUNTER — Other Ambulatory Visit (HOSPITAL_BASED_OUTPATIENT_CLINIC_OR_DEPARTMENT_OTHER): Payer: Self-pay

## 2024-01-25 ENCOUNTER — Encounter (HOSPITAL_BASED_OUTPATIENT_CLINIC_OR_DEPARTMENT_OTHER): Payer: Self-pay | Admitting: Obstetrics & Gynecology

## 2024-01-25 DIAGNOSIS — M25551 Pain in right hip: Secondary | ICD-10-CM | POA: Diagnosis not present

## 2024-01-25 DIAGNOSIS — M545 Low back pain, unspecified: Secondary | ICD-10-CM | POA: Diagnosis not present

## 2024-01-25 MED ORDER — PREDNISONE 5 MG (21) PO TBPK
ORAL_TABLET | ORAL | 0 refills | Status: DC
Start: 2024-01-25 — End: 2024-02-07
  Filled 2024-01-25: qty 21, 6d supply, fill #0

## 2024-01-28 ENCOUNTER — Ambulatory Visit (INDEPENDENT_AMBULATORY_CARE_PROVIDER_SITE_OTHER): Admitting: Licensed Clinical Social Worker

## 2024-01-28 DIAGNOSIS — F331 Major depressive disorder, recurrent, moderate: Secondary | ICD-10-CM | POA: Insufficient documentation

## 2024-01-28 DIAGNOSIS — F411 Generalized anxiety disorder: Secondary | ICD-10-CM
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
## 2024-01-30 DIAGNOSIS — M47896 Other spondylosis, lumbar region: Secondary | ICD-10-CM | POA: Diagnosis not present

## 2024-01-31 NOTE — Progress Notes (Deleted)
 NEUROLOGY FOLLOW UP OFFICE NOTE  Jo GUGGENHEIM [id number]  Assessment/Plan:   Migraine without aura, without status migrainosus, not intractable Clumsiness, memory deficits - concern it may be side effect to topiramate    Migraine prevention:   Decrease topiramate  back down to 50mg  at bedtime  Migraine rescue:  naproxen  500mg   Limit use of pain relievers to no more than 2 days out of week to prevent risk of rebound or medication-overuse headache. Keep headache diary If headaches worsen, she will let me know Follow up 6 months.    Subjective:  [name] is a 73 year old left-handed female with HTN, HLD, aortic atherosclerosis, prediabetes, GERD, and depression who follows up for migraines.  UPDATE: Due to concern that topiramate  was contributing to memory problems and clumsiness, last visit we decreased dose back to 50mg  at bedtime.  ***   Intensity:  moderate Duration:  couple of hours with naproxen  Frequency:  3 to 4 in past 6 months.   Current NSAIDS/analgesics:  none Current triptans:  none Current ergotamine:  none Current anti-emetic:  Zofran -ODT 4mg  Current muscle relaxants:  none Current Antihypertensive medications:  metoprolol  succinate 25mg  daily, amlodipine  Current Antidepressant medications:  Wellbutrin  XL 450mg  daily.  Prescribed Cymbalta  but hasn't started yet. Current Anticonvulsant medications:  topiramate  50mg  at bedtime, gabapentin  600mg  QHS Current anti-CGRP:  none Current Vitamins/Herbal/Supplements:  ferrous sulfate Current Antihistamines/Decongestants:  none Other therapy:  none Birth control:  none Other medications:  Lunesta    Caffeine:  Unsweet iced tea daily.  Diet Pepsi 3 times a week; coffee once every 2-3 weeks. Diet:  Cut out wine.   Exercise:  gym variably  Depression:  yes; Anxiety:  no Other pain:  right knee pain Sleep hygiene:  Chronic insomnia.  On Lunesta , gabapentin  and prozosin which help.   HISTORY: She had a  fall in August 2023 in which she hit her head and broke teeth.  She developed a daily persistent headache after that event.  It has since decreased to 3 days a week.  She started topiramate  by psychiatry for her mood, but she maintains headache frequency improved prior to starting topiramate .  They last all day.  She describes a moderate-severe bifrontal/occipital pressure headache associated with nausea, photophobia, phonophobia but no visual disturbance or other aura.  She has tried multiple over the counter analgesics and diclofenac  which were ineffective.  However, naproxen  seems effective.     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  She does not recall any prior history of significant head trauma.    She has also been experiencing multiple somatic symptoms.  She endorses generalized fatigue, abdominal bloating/swelling, excessive sweating, tachycardia.  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  CBC was normal with normal iron panel, TSH 3.430, vit D 46.6, B12 415, Hgb A1c 5.7  Past NSAIDS/analgesics:  diclofenac  75mg , Motrin /ibuprofen , Aleve /naproxen  Past abortive triptans:  none Past abortive ergotamine:  none Past muscle relaxants:  tizanidine  Past anti-emetic:  Compazine  Past antihypertensive medications:  prazosin  Past antidepressant medications:  venlafaxine  Past anticonvulsant medications:  lamotrigine  Other past therapies:  none   Family history of headache:  no  PAST MEDICAL HISTORY: Past Medical History:  Diagnosis Date   Anemia    Anxiety    Arthritis    Complication of anesthesia  past hx. laryngospasm following 2 past surgeries, not recent   Depression    Exertional dyspnea 08/04/2019   GERD (gastroesophageal reflux disease)    Hiatal hernia  10/17/2019   Hyperlipidemia    Insomnia    periodically uses med.   Perforated sigmoid colon (HCC) 04/28/2014   during colonoscopy, rupture colonbecame septic, has colostomy and open surgical wound   Pneumonia    Pre-diabetes    Prediabetes 08/09/2017   Primary hypertension 01/05/2023   Suicide attempt (HCC)    Vitamin D  deficiency     MEDICATIONS: Current Outpatient Medications on File Prior to Visit  Medication Sig Dispense Refill   buPROPion  (WELLBUTRIN  XL) 150 MG 24 hr tablet Take 1 tablet (150 mg total) by mouth daily. 90 tablet 3   buPROPion  (WELLBUTRIN  XL) 300 MG 24 hr tablet Take 1 tablet (300 mg total) by mouth daily. 90 tablet 3   diazepam  (VALIUM ) 5 MG tablet Take 1 tablet (5 mg total) by mouth before bed and 1 tablet 1 hour before appointment. 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 4 tablet 0   eszopiclone  (LUNESTA ) 2 MG TABS tablet Take 1 tablet (2 mg total) by mouth at bedtime. 90 tablet 0   Evolocumab  (REPATHA  SURECLICK) 140 MG/ML SOAJ Inject 140 mg into the skin every 14 (fourteen) days. 6 mL 3   gabapentin  (NEURONTIN ) 600 MG tablet Take 1 tablet (600 mg total) by mouth every evening. 90 tablet 0   ondansetron  (ZOFRAN -ODT) 4 MG disintegrating tablet Dissolve 1-2 tablets under the tongue every 6 hours as needed 30 tablet 2   pantoprazole  (PROTONIX ) 40 MG tablet Take 1 tablet (40 mg total) by mouth 2 (two) times daily. 180 tablet 3   prazosin  (MINIPRESS ) 2 MG capsule Take 1 capsule (2 mg total) by mouth at bedtime. 90 capsule 0   predniSONE  (STERAPRED UNI-PAK 21 TAB) 5 MG (21) TBPK tablet Take as directed on packaging instructions 21 each 0   topiramate  (TOPAMAX ) 25 MG tablet Take 50 mg by mouth daily.     valACYclovir  (VALTREX ) 1000 MG tablet Take 2 tablets (2,000 mg total) by mouth at first sign of outbreak and then 2 tablets 12 hours later. 4 tablet 6   Vitamin D , Ergocalciferol , (DRISDOL ) 1.25 MG (50000 UNIT) CAPS capsule Take 1 capsule by mouth every 7 days. 12  capsule 3   No current facility-administered medications on file prior to visit.    ALLERGIES: Allergies  Allergen Reactions   Bee Venom Swelling    Yellow jackets   Statins Other (See Comments)    Muscle pains  CRESTOR , LIPITOR    FAMILY HISTORY: Family History  Problem Relation Age of Onset   Hypertension Mother    Diabetes Mother    Dementia Father    Hyperlipidemia Father    Hypertension Father    Diabetes Father    Cancer Father        History of bladder cancer   Heart disease Father    Other Father        4 ft intestinal removed-had gangrene in gallbladder, then spread to intestines   Heart attack Father    Esophageal cancer Sister    Hyperlipidemia Brother    Heart disease Maternal Grandmother    Stroke Maternal Grandfather    Cancer Paternal Grandmother        uterine   Cancer Paternal Grandfather        pancreatic cancer   Parkinsonism Maternal Uncle    Colon cancer  Neg Hx       Objective:  *** General: No acute distress.  Patient appears well-groomed.   ***    Juliene Dunnings, DO  CC: Norleen Jobs, MD

## 2024-02-01 ENCOUNTER — Emergency Department (HOSPITAL_BASED_OUTPATIENT_CLINIC_OR_DEPARTMENT_OTHER)
Admission: EM | Admit: 2024-02-01 | Discharge: 2024-02-01 | Disposition: A | Discharge status: Home / Self Care | Attending: Emergency Medicine | Admitting: Emergency Medicine

## 2024-02-01 ENCOUNTER — Other Ambulatory Visit: Payer: Self-pay

## 2024-02-01 ENCOUNTER — Ambulatory Visit: Payer: PPO | Admitting: Neurology

## 2024-02-01 ENCOUNTER — Encounter (HOSPITAL_BASED_OUTPATIENT_CLINIC_OR_DEPARTMENT_OTHER): Payer: Self-pay

## 2024-02-01 DIAGNOSIS — W1809XA Striking against other object with subsequent fall, initial encounter: Secondary | ICD-10-CM | POA: Diagnosis not present

## 2024-02-01 DIAGNOSIS — S40012A Contusion of left shoulder, initial encounter: Secondary | ICD-10-CM | POA: Insufficient documentation

## 2024-02-01 DIAGNOSIS — S0993XA Unspecified injury of face, initial encounter: Secondary | ICD-10-CM | POA: Diagnosis present

## 2024-02-01 DIAGNOSIS — W19XXXA Unspecified fall, initial encounter: Secondary | ICD-10-CM

## 2024-02-01 DIAGNOSIS — S01511A Laceration without foreign body of lip, initial encounter: Secondary | ICD-10-CM | POA: Diagnosis not present

## 2024-02-01 NOTE — Discharge Instructions (Signed)
 Perform saline rinses several times daily for the next few days.  Return to the ER for any new and/or concerning issues.

## 2024-02-01 NOTE — ED Triage Notes (Signed)
 Pt reports getting up to use bathroom about 30 minutes ago and that she got dizzy and starting falling, fell into frame of door hitting mouth and left shoulder. Bruising noted to L shoulder, no obvious deformity. Sensation intact distally. Laceration noted to bottom lip, no active bleeding. (-) thinners. No dizziness at this time.

## 2024-02-01 NOTE — ED Provider Notes (Signed)
 [place] EMERGENCY DEPARTMENT AT [facility] Provider Note   CSN: [id number] Arrival date & time: 02/01/24  9642     Patient presents with: Facial Injury   [name] is a 73 y.o. female.   Patient is a 73 year old female presenting with complaints of a fall.  She got up to go to the bathroom, became dizzy, then fell forward and hit her face on the door.  She has a laceration to her lower lip and a bruise to the left shoulder, but denies injury otherwise.  She denies any headache, loss of consciousness, neck pain, difficulty breathing, or other injury.       Prior to Admission medications   Medication Sig Start Date End Date Taking? Authorizing Provider  buPROPion  (WELLBUTRIN  XL) 150 MG 24 hr tablet Take 1 tablet (150 mg total) by mouth daily. 10/26/23   Mozingo, Regina Nattalie, NP  buPROPion  (WELLBUTRIN  XL) 300 MG 24 hr tablet Take 1 tablet (300 mg total) by mouth daily. 07/16/23   Mozingo, Regina Nattalie, NP  diazepam  (VALIUM ) 5 MG tablet Take 1 tablet (5 mg total) by mouth before bed and 1 tablet 1 hour before appointment. 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 01/23/24     eszopiclone  (LUNESTA ) 2 MG TABS tablet Take 1 tablet (2 mg total) by mouth at bedtime. 01/24/24   Mozingo, Regina Nattalie, NP  Evolocumab  (REPATHA  SURECLICK) 140 MG/ML SOAJ Inject 140 mg into the skin every 14 (fourteen) days. 08/13/23   Raford Riggs, MD  gabapentin  (NEURONTIN ) 600 MG tablet Take 1 tablet (600 mg total) by mouth every evening. 01/24/24   Mozingo, Regina Nattalie, NP  ondansetron  (ZOFRAN -ODT) 4 MG disintegrating tablet Dissolve 1-2 tablets under the tongue every 6 hours as needed 11/12/23   Beather Delon Gibson, PA  pantoprazole  (PROTONIX ) 40 MG tablet Take 1 tablet (40 mg total) by mouth 2 (two) times daily. 08/15/23   Charlanne Groom, MD  prazosin  (MINIPRESS ) 2 MG capsule Take 1 capsule (2 mg total) by mouth at bedtime. 01/24/24   Mozingo, Regina Nattalie, NP  predniSONE  (STERAPRED  UNI-PAK 21 TAB) 5 MG (21) TBPK tablet Take as directed on packaging instructions 01/25/24   Lenis Barter, PA-C  topiramate  (TOPAMAX ) 25 MG tablet Take 50 mg by mouth daily.    [provider]  valACYclovir  (VALTREX ) 1000 MG tablet Take 2 tablets (2,000 mg total) by mouth at first sign of outbreak and then 2 tablets 12 hours later. 01/01/24     Vitamin D , Ergocalciferol , (DRISDOL ) 1.25 MG (50000 UNIT) CAPS capsule Take 1 capsule by mouth every 7 days. 04/19/23   Cleotilde Ronal RAMAN, MD    Allergies: Bee venom and Statins    Review of Systems  All other systems reviewed and are negative.   Updated Vital Signs BP 134/60 (BP Location: Right Arm)   Pulse 77   Temp (!) 97.5 F (36.4 C) (Temporal)   Resp 16   Ht 5' 6 (1.676 m)   Wt 62.6 kg   LMP 08/07/1998   SpO2 100%   BMI 22.27 kg/m   Physical Exam Vitals and nursing note reviewed.  Constitutional:      General: She is not in acute distress.    Appearance: She is well-developed. She is not diaphoretic.  HENT:     Head: Normocephalic and atraumatic.     Mouth/Throat:     Comments: There is a 1 cm laceration noted to the inside of her lower lip.  The laceration is  slightly gaping.  Eyes:     Extraocular Movements: Extraocular movements intact.     Pupils: Pupils are equal, round, and reactive to light.    Cardiovascular:     Rate and Rhythm: Normal rate and regular rhythm.     Heart sounds: No murmur heard.    No friction rub. No gallop.  Pulmonary:     Effort: Pulmonary effort is normal. No respiratory distress.     Breath sounds: Normal breath sounds. No wheezing.  Abdominal:     General: Bowel sounds are normal. There is no distension.     Palpations: Abdomen is soft.     Tenderness: There is no abdominal tenderness.   Musculoskeletal:        General: Normal range of motion.     Cervical back: Normal range of motion and neck supple.     Comments: There is bruising noted to the anterior aspect of the left shoulder,  but she has free range of motion with no discomfort.  Ulnar and radial pulses are palpable and motor and sensation are intact throughout the entire arm.   Skin:    General: Skin is warm and dry.   Neurological:     General: No focal deficit present.     Mental Status: She is alert and oriented to person, place, and time.     Cranial Nerves: No cranial nerve deficit.     (all labs ordered are listed, but only abnormal results are displayed) Labs Reviewed - No data to display  EKG: None  Radiology: No results found.   Procedures   Medications Ordered in the ED - No data to display                                  Medical Decision Making  Patient presenting after a fall as described in the HPI.  Her main reason for coming in was to have her lip looked at.  She does have a 1 cm laceration noted just to the inside of the lower lip that I feel will heal well without repair.  Also she has some bruising to the left shoulder, but has full range of motion with no discomfort and I highly doubt any fracture or dislocation.  I do not feel as though any x-rays are indicated.  Patient will be discharged with saline rinses and as needed return.     Final diagnoses:  None    ED Discharge Orders     None          Geroldine Berg, MD 02/01/24 [phone]

## 2024-02-06 ENCOUNTER — Telehealth: Payer: Self-pay | Admitting: Neurology

## 2024-02-06 DIAGNOSIS — M47896 Other spondylosis, lumbar region: Secondary | ICD-10-CM | POA: Diagnosis not present

## 2024-02-06 NOTE — Telephone Encounter (Signed)
 Pt. [name] went to ED would like to know if she should sched followup

## 2024-02-07 ENCOUNTER — Encounter: Payer: Self-pay | Admitting: Family Medicine

## 2024-02-07 ENCOUNTER — Ambulatory Visit: Admitting: Family Medicine

## 2024-02-07 VITALS — BP 136/68 | HR 104 | Ht 65.5 in | Wt 137.0 lb

## 2024-02-07 DIAGNOSIS — S01511D Laceration without foreign body of lip, subsequent encounter: Secondary | ICD-10-CM

## 2024-02-07 DIAGNOSIS — R519 Headache, unspecified: Secondary | ICD-10-CM

## 2024-02-07 NOTE — Telephone Encounter (Signed)
 I called the patient reached vm, left phone number of [phone] for the Grievance depart and left email of [email or web address].  Also sent in my chart.

## 2024-02-07 NOTE — Telephone Encounter (Addendum)
 Per patient seen in the ED for a fall she had.   Per patient she thought she fell going to the bathroom.  But this morning she do not remember getting up to go to  the bathroom, or feeling dizzy. Patient states she do not remember anything. Just getting up with blood in her mouth and driving herself to the ED.  Per patient she is unsure if she had a headache that day. But since her fall she had a headache up until yesterday.    Please advise.

## 2024-02-07 NOTE — Telephone Encounter (Signed)
 Patient advised of Dr.Jaffe note, I reviewed the ED note. It sounds like that she fainted. Since this is a new issue, I can't provide further details but I recommend following up with her PCP ASAP for further evaluation. It appears that they did not do a CT of the head. If she is having headaches after this, then I recommend going to the ED to make sure there isn't anything such as a bleed, that would be causing a headache. As long as this hasn't been fully evaluated I really can't make further recommendations about treating the headache. She has appointment with me for follow up next week on 7/9.    Per patient she doesn't have a headache today so she will wait and see if her symptoms get sore before she goes to the ED. But she will follow up with her PCP.

## 2024-02-07 NOTE — Telephone Encounter (Signed)
 Copied from CRM [id number]. Topic: Complaint (DO NOT CONVERT) - Care >> Feb 07, 2024 11:38 AM Powell HERO wrote: Date of Incident: 02/01/2024  Details of complaint: Patient states she went to the ER for a fall and was not thoroughly checked over. There are notes of things that were checked and marked as okay that were never looked at. She said no one examined her and felt like she was not checked thoroughly. Feels her chart was filled with falsified information.  How would the patient like to see it resolved? Someone from this office speak to her about this in regard to what she should do, the hospital has not been responsive to her complaint. Doesn't know how to handle this. Asking for suggestions.   On a scale of 1-10, how was your experience? N/A  What would it take to bring it to a 10? N/A  Route to Research officer, political party.

## 2024-02-07 NOTE — Progress Notes (Signed)
 Chief Complaint  Patient presents with   Hospitalization Follow-up    She does not remember getting out of bed or falling, no memory of incident- realized she had cut her lip-noticed shoulder bruised- Now she has had a headache and pressure behind eyes since this past Saturday daily-Has not had CT scan done-Not good balance since knee surgery- gabapentin , lunesta  at bedtime. Valium  was for dentist appt.   She went to ER on 6/27 for evaluation of lip laceration after a fall. ER notes state that she got dizzy going to the bathroom, fell forward and hit her face on the door. She has no recollection of the incident. She had assumed that she had gotten up to go to the bathroom.  Denies any stumble. She doesn't recall feeling dizzy--just that the next thing she knows her mouth hurts and her lip was split open.  Doesn't remember getting to that point (walking/falling).  She is not aware of any other significant head injury--the bleeding lip was the only injury on head. She does report some balance issues since her knee surgery.  R knee hurt more the following day.  She is complaining of HA and pressure behind her eyes since the day after the injury (6/28). Doesn't have the HA in the mornings (just mild/vague), gets worse as the day progresses.  Having pressure behind her eyes, blurry when she tries to read (not new--having some issues/problems related to adjusting to glasses).  Denies other neurologic symptoms--no numbness, tingling, weakness, memory issues, trouble speaking/understanding.  Prior to this incident, she hadn't had a headache in a long time. She actually cancelled her f/u visit last week with Dr. Skeet due to not having any HA's. She is now r/s to see him next week. Last saw Dr. Skeet in 07/2023, dx migraine without aura. Topamax  was decreased to 50 mg. She had been on topamax  and gabapentin  even prior to seeing Dr. Skeet, for moods (from psych).    PMH, PSH, SH reviewed  Outpatient  Encounter Medications as of 02/07/2024  Medication Sig   buPROPion  (WELLBUTRIN  XL) 150 MG 24 hr tablet Take 1 tablet (150 mg total) by mouth daily.   buPROPion  (WELLBUTRIN  XL) 300 MG 24 hr tablet Take 1 tablet (300 mg total) by mouth daily.   eszopiclone  (LUNESTA ) 2 MG TABS tablet Take 1 tablet (2 mg total) by mouth at bedtime.   Evolocumab  (REPATHA  SURECLICK) 140 MG/ML SOAJ Inject 140 mg into the skin every 14 (fourteen) days.   gabapentin  (NEURONTIN ) 600 MG tablet Take 1 tablet (600 mg total) by mouth every evening.   ibuprofen  (ADVIL ) 200 MG tablet Take 800 mg by mouth once.   ondansetron  (ZOFRAN -ODT) 4 MG disintegrating tablet Dissolve 1-2 tablets under the tongue every 6 hours as needed   pantoprazole  (PROTONIX ) 40 MG tablet Take 1 tablet (40 mg total) by mouth 2 (two) times daily.   prazosin  (MINIPRESS ) 2 MG capsule Take 1 capsule (2 mg total) by mouth at bedtime.   topiramate  (TOPAMAX ) 25 MG tablet Take 50 mg by mouth daily. (Patient taking differently: Take 50 mg by mouth 2 (two) times daily.)   valACYclovir  (VALTREX ) 1000 MG tablet Take 2 tablets (2,000 mg total) by mouth at first sign of outbreak and then 2 tablets 12 hours later.   Vitamin D , Ergocalciferol , (DRISDOL ) 1.25 MG (50000 UNIT) CAPS capsule Take 1 capsule by mouth every 7 days.   [DISCONTINUED] diazepam  (VALIUM ) 5 MG tablet Take 1 tablet (5 mg total) by mouth before bed  and 1 tablet 1 hour before appointment. 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   [DISCONTINUED] predniSONE  (STERAPRED UNI-PAK 21 TAB) 5 MG (21) TBPK tablet Take as directed on packaging instructions   No facility-administered encounter medications on file as of 02/07/2024.   Allergies  Allergen Reactions   Bee Venom Swelling    Yellow jackets   Statins Other (See Comments)    Muscle pains  CRESTOR , LIPITOR    ROS: no f/c, URI symptoms, vertigo, LH, CP, SOB, palpitations, GI or GU complaints. Lip is healing. Bruising is improving (L shoulder R  shin). Denies any neurologic symptoms other than the headaches which are minimal in the morning, currently 4/10 (in the afternoon). Some pressure around her eyes. See HPI.    PHYSICAL EXAM:  BP 136/68   Pulse (!) 104   Ht 5' 5.5 (1.664 m)   Wt 137 lb (62.1 kg)   LMP 08/07/1998   SpO2 97%   BMI 22.45 kg/m   Somewhat excitable, pleasant female in no distress HEENT: conjunctiva and sclera are clear, EOMI.  Atraumatic. R fundus normal, L wasn't as well visualized, possible cataract TM's and EAC's normal. Nasal mucosa normal, no sinus tenderness. OP is clear. There is a split in the lower lip, with wound edges gaping about 1mm. No erythema, soft tissue swelling, crusting. Neck: no lymphadenopathy, thyromegaly or carotid bruit. No c-spine tenderness Heart: regular rate and rhythm, no murmur Lungs: clear bilaterally Back: no spinal or CVA tenderness Abdomen: soft, nontender, no mass Extremities: no edema, 2+ pulses Neuro: alert and oriented, cranial nerves 2-12 intact. Normal strength, sensation. DTR's 2+ and symmetric. Normal finger-to-nose.  Negative Romberg (some mild swaying). Normal tandem gait. Skin: normal turgor.  Some bruising (yellow-green) at R upper-mid shin, and L anterior shoulder (yellow-green). Psych: mildly anxious/excitable, full range of affect, normal hygiene and grooming    ASSESSMENT/PLAN:  Nonintractable headache, unspecified chronicity pattern, unspecified headache type - Reassured of normal neuro exam, no progressive neuro symptoms since fall 5 days ago. No indication for CT. f/u with neuro on Monday as scheduled  Lip laceration, subsequent encounter - healing, no bleeding. no e/o infection  Pt with documented h/o migraines.  Current HA do not seem like migraines. We discussed her meds briefly--not sure if she wants to really stay on gabapentin  and topamax .  These have been prescribed by her psych for moods, and also recommended to help with HA's.   Encouraged her to further discuss her concerns about the medications with her psych and neurologist. She has f/u with Dr. Skeet scheduled for next week.   I spent 37 minutes dedicated to the care of this patient, including pre-visit review of records, face to face time, post-visit ordering of testing and documentation.   Your neurologic exam is normal (only thing I couldn't really evaluate was the back of the left eye, you might have a cataract affecting that). I do not see any evidence of any kind of brain injury or bleed. Follow up with Dr. Skeet next week, to further discuss your headaches, and medication concerns (re: gabapentin  and topamax ).  You may use tylenol  for your headache. If this isn't effective, you can use ibuprofen  sparingly--we reviewed your kidney function and it was just barely outside of the range. Dont' you any anti-inflammatories regularly, but an occasional dose is likely fine.  Go to the ER if you have worsening headaches, nausea and vomiting (indicating increased intracranial pressure), new neurologic symptoms--ie vision loss, numbness, tingling, weakness, trouble speaking,  thinking, etc, then please go to the ER.

## 2024-02-07 NOTE — Patient Instructions (Signed)
  Your neurologic exam is normal (only thing I couldn't really evaluate was the back of the left eye, you might have a cataract affecting that). I do not see any evidence of any kind of brain injury or bleed. Follow up with Dr. Skeet next week, to further discuss your headaches, and medication concerns (re: gabapentin  and topamax ).  You may use tylenol  for your headache. If this isn't effective, you can use ibuprofen  sparingly--we reviewed your kidney function and it was just barely outside of the range. Dont' you any anti-inflammatories regularly, but an occasional dose is likely fine.  Go to the ER if you have worsening headaches, nausea and vomiting (indicating increased intracranial pressure), new neurologic symptoms--ie vision loss, numbness, tingling, weakness, trouble speaking, thinking, etc, then please go to the ER.

## 2024-02-11 ENCOUNTER — Other Ambulatory Visit (HOSPITAL_BASED_OUTPATIENT_CLINIC_OR_DEPARTMENT_OTHER): Payer: Self-pay

## 2024-02-12 ENCOUNTER — Ambulatory Visit (INDEPENDENT_AMBULATORY_CARE_PROVIDER_SITE_OTHER): Admitting: Licensed Clinical Social Worker

## 2024-02-12 DIAGNOSIS — F331 Major depressive disorder, recurrent, moderate: Secondary | ICD-10-CM

## 2024-02-12 DIAGNOSIS — F411 Generalized anxiety disorder: Secondary | ICD-10-CM | POA: Diagnosis not present
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
## 2024-02-12 NOTE — Progress Notes (Unsigned)
 NEUROLOGY FOLLOW UP OFFICE NOTE  [name] [id number]  Assessment/Plan:   Migraine without aura, without status migrainosus, not intractable Clumsiness, memory deficits - concern it may be side effect to topiramate    Migraine prevention:   Decrease topiramate  back down to 50mg  at bedtime  Migraine rescue:  naproxen  500mg   Limit use of pain relievers to no more than 2 days out of week to prevent risk of rebound or medication-overuse headache. Keep headache diary If headaches worsen, she will let me know Follow up 6 months.    Subjective:  Jo MICHAUX is a 73 year old left-handed female with HTN, HLD, aortic atherosclerosis, prediabetes, GERD, and depression who follows up for migraines.  UPDATE: Due to concern that topiramate  was contributing to memory problems and clumsiness, last visit we decreased dose back to 50mg  at bedtime.  Some improvement -    Intensity:  moderate Duration:  couple of hours with naproxen  Frequency:  1 every 3 to 4 weeks.    On 6/27, she got up to go to the bathroom when she became dizzy and fell forward hitting her face on the door.  No loss of consciousness.  She sustained a laceration and was treated in the ED.  But not sure - on bathroom floor with lip laceration  No history of syncope but sometimes may fell lightheaded and then sit down.     Pressure behind the eyes - 6-7 hours, since fall, 3 times a week.    Current NSAIDS/analgesics:  none Current triptans:  none Current ergotamine:  none Current anti-emetic:  Zofran -ODT 4mg  Current muscle relaxants:  none Current Antihypertensive medications:  metoprolol  succinate 25mg  daily, amlodipine  Current Antidepressant medications:  Wellbutrin  XL 450mg  daily.  Prescribed Cymbalta  but hasn't started yet. Current Anticonvulsant medications:  topiramate  50mg  at bedtime, gabapentin  600mg  QHS Current anti-CGRP:  none Current Vitamins/Herbal/Supplements:  ferrous sulfate Current  Antihistamines/Decongestants:  none Other therapy:  none Birth control:  none Other medications:  Lunesta  2mg     Caffeine:  Unsweet iced tea daily.  Diet Pepsi 3 times a week; coffee once every 2-3 weeks. Diet:  Cut out wine.   Exercise:  gym variably  Depression:  yes; Anxiety:  no Other pain:  right knee pain Sleep hygiene:  Chronic insomnia.  On Lunesta , gabapentin  and prozosin which help.   HISTORY: She had a fall in August 2023 in which she hit her head and broke teeth.  She developed a daily persistent headache after that event.  It has since decreased to 3 days a week.  She started topiramate  by psychiatry for her mood, but she maintains headache frequency improved prior to starting topiramate .  They last all day.  She describes a moderate-severe bifrontal/occipital pressure headache associated with nausea, photophobia, phonophobia but no visual disturbance or other aura.  She has tried multiple over the counter analgesics and diclofenac  which were ineffective.  However, naproxen  seems effective.     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  She does not recall any prior history of significant head trauma.    She has also been experiencing multiple somatic symptoms.  She endorses generalized fatigue, abdominal bloating/swelling, excessive sweating, tachycardia.  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  CBC was normal with normal iron panel, TSH 3.430, vit D  46.6, B12 415, Hgb A1c 5.7  Past NSAIDS/analgesics:  diclofenac  75mg , Motrin /ibuprofen , Aleve /naproxen  Past abortive triptans:  none Past abortive ergotamine:  none Past muscle relaxants:  tizanidine  Past anti-emetic:  Compazine  Past antihypertensive medications:  prazosin  Past antidepressant medications:   venlafaxine  Past anticonvulsant medications:  lamotrigine  Other past therapies:  none   Family history of headache:  no  PAST MEDICAL HISTORY: Past Medical History:  Diagnosis Date   Anemia    Anxiety    Arthritis    Complication of anesthesia    past hx. laryngospasm following 2 past surgeries, not recent   Depression    Exertional dyspnea 08/04/2019   GERD (gastroesophageal reflux disease)    Hiatal hernia 10/17/2019   Hyperlipidemia    Insomnia    periodically uses med.   Perforated sigmoid colon (HCC) 04/28/2014   during colonoscopy, rupture colonbecame septic, has colostomy and open surgical wound   Pneumonia    Pre-diabetes    Prediabetes 08/09/2017   Primary hypertension 01/05/2023   Suicide attempt (HCC)    Vitamin D  deficiency     MEDICATIONS: Current Outpatient Medications on File Prior to Visit  Medication Sig Dispense Refill   buPROPion  (WELLBUTRIN  XL) 150 MG 24 hr tablet Take 1 tablet (150 mg total) by mouth daily. 90 tablet 3   buPROPion  (WELLBUTRIN  XL) 300 MG 24 hr tablet Take 1 tablet (300 mg total) by mouth daily. 90 tablet 3   eszopiclone  (LUNESTA ) 2 MG TABS tablet Take 1 tablet (2 mg total) by mouth at bedtime. 90 tablet 0   Evolocumab  (REPATHA  SURECLICK) 140 MG/ML SOAJ Inject 140 mg into the skin every 14 (fourteen) days. 6 mL 3   gabapentin  (NEURONTIN ) 600 MG tablet Take 1 tablet (600 mg total) by mouth every evening. 90 tablet 0   ibuprofen  (ADVIL ) 200 MG tablet Take 800 mg by mouth once.     ondansetron  (ZOFRAN -ODT) 4 MG disintegrating tablet Dissolve 1-2 tablets under the tongue every 6 hours as needed 30 tablet 2   pantoprazole  (PROTONIX ) 40 MG tablet Take 1 tablet (40 mg total) by mouth 2 (two) times daily. 180 tablet 3   prazosin  (MINIPRESS ) 2 MG capsule Take 1 capsule (2 mg total) by mouth at bedtime. 90 capsule 0   topiramate  (TOPAMAX ) 25 MG tablet Take 50 mg by mouth daily. (Patient taking differently: Take 50 mg by mouth 2 (two) times  daily.)     valACYclovir  (VALTREX ) 1000 MG tablet Take 2 tablets (2,000 mg total) by mouth at first sign of outbreak and then 2 tablets 12 hours later. 4 tablet 6   Vitamin D , Ergocalciferol , (DRISDOL ) 1.25 MG (50000 UNIT) CAPS capsule Take 1 capsule by mouth every 7 days. 12 capsule 3   No current facility-administered medications on file prior to visit.    ALLERGIES: Allergies  Allergen Reactions   Bee Venom Swelling    Yellow jackets   Statins Other (See Comments)    Muscle pains  CRESTOR , LIPITOR    FAMILY HISTORY: Family History  Problem Relation Age of Onset   Hypertension Mother    Diabetes Mother    Dementia Father    Hyperlipidemia Father    Hypertension Father    Diabetes Father    Cancer Father        History of bladder cancer   Heart disease Father    Other Father        4 ft intestinal removed-had gangrene in gallbladder, then spread to intestines   Heart attack  Father    Esophageal cancer Sister    Hyperlipidemia Brother    Heart disease Maternal Grandmother    Stroke Maternal Grandfather    Cancer Paternal Grandmother        uterine   Cancer Paternal Grandfather        pancreatic cancer   Parkinsonism Maternal Uncle    Colon cancer Neg Hx       Objective:  *** General: No acute distress.  Patient appears well-groomed.   ***    Jo Dunnings, DO  CC: Norleen Jobs, MD

## 2024-02-13 ENCOUNTER — Ambulatory Visit: Admitting: Neurology

## 2024-02-13 VITALS — BP 134/77 | HR 103 | Ht 66.0 in | Wt 138.0 lb

## 2024-02-13 DIAGNOSIS — R4020 Unspecified coma: Secondary | ICD-10-CM

## 2024-02-13 DIAGNOSIS — G43009 Migraine without aura, not intractable, without status migrainosus: Secondary | ICD-10-CM | POA: Diagnosis not present

## 2024-02-13 DIAGNOSIS — G44319 Acute post-traumatic headache, not intractable: Secondary | ICD-10-CM

## 2024-02-13 NOTE — Patient Instructions (Addendum)
 Check MRI of brain without contrast. We have sent a referral to [facility] for your MRI and they will call you directly to schedule your appointment. They are located at [address] [facility]. If you need to contact them directly please call [phone].  Check EEG An EEG has been ordered. To prepare for this procedure:   -Take prescribed medications as normal -Please have hair free of any product, oils, or any hairstyles that impede a connection to the scalp (sewn in hair, glued in hair, etc) -Please ensure that hair is fully dry   As per [place]  state law, no driving for 6 months from last possible seizure or unexplained loss of consciousness.  Please refer to the following link on the [employer]ile[employer]'s website for more information: http://www.epilepsyfoundation.org/answerplace/Social/driving/drivingu.cfm  Follow up in 5 months.

## 2024-02-14 ENCOUNTER — Other Ambulatory Visit (HOSPITAL_BASED_OUTPATIENT_CLINIC_OR_DEPARTMENT_OTHER): Payer: Self-pay

## 2024-02-14 ENCOUNTER — Encounter: Payer: Self-pay | Admitting: Neurology

## 2024-02-14 ENCOUNTER — Other Ambulatory Visit: Payer: Self-pay

## 2024-02-19 ENCOUNTER — Ambulatory Visit (INDEPENDENT_AMBULATORY_CARE_PROVIDER_SITE_OTHER): Admitting: Neurology

## 2024-02-19 DIAGNOSIS — R4020 Unspecified coma: Secondary | ICD-10-CM | POA: Diagnosis not present

## 2024-02-19 NOTE — Progress Notes (Unsigned)
 EEG complete and ready for review.

## 2024-02-20 ENCOUNTER — Telehealth: Payer: Self-pay | Admitting: Neurology

## 2024-02-20 NOTE — Telephone Encounter (Signed)
 EEG is normal.  No further recommendations at this time.

## 2024-02-20 NOTE — Progress Notes (Signed)
 ELECTROENCEPHALOGRAM REPORT  Date of Study: 02/19/2024  Patient's Name: [name] MRN: [id number] Date of Birth: 1951/01/29  Clinical History: 73 year old female with loss of consciousness  Medications: Current Outpatient Medications on File Prior to Visit  Medication Sig Dispense Refill   buPROPion  (WELLBUTRIN  XL) 150 MG 24 hr tablet Take 1 tablet (150 mg total) by mouth daily. 90 tablet 3   buPROPion  (WELLBUTRIN  XL) 300 MG 24 hr tablet Take 1 tablet (300 mg total) by mouth daily. 90 tablet 3   eszopiclone  (LUNESTA ) 2 MG TABS tablet Take 1 tablet (2 mg total) by mouth at bedtime. 90 tablet 0   Evolocumab  (REPATHA  SURECLICK) 140 MG/ML SOAJ Inject 140 mg into the skin every 14 (fourteen) days. 6 mL 3   gabapentin  (NEURONTIN ) 600 MG tablet Take 1 tablet (600 mg total) by mouth every evening. 90 tablet 0   ondansetron  (ZOFRAN -ODT) 4 MG disintegrating tablet Dissolve 1-2 tablets under the tongue every 6 hours as needed 30 tablet 2   pantoprazole  (PROTONIX ) 40 MG tablet Take 1 tablet (40 mg total) by mouth 2 (two) times daily. 180 tablet 3   prazosin  (MINIPRESS ) 2 MG capsule Take 1 capsule (2 mg total) by mouth at bedtime. 90 capsule 0   topiramate  (TOPAMAX ) 25 MG tablet Take 50 mg by mouth daily.     valACYclovir  (VALTREX ) 1000 MG tablet Take 2 tablets (2,000 mg total) by mouth at first sign of outbreak and then 2 tablets 12 hours later. (Patient taking differently: Take 2,000 mg by mouth as needed. Only after dentist appointments) 4 tablet 6   Vitamin D , Ergocalciferol , (DRISDOL ) 1.25 MG (50000 UNIT) CAPS capsule Take 1 capsule by mouth every 7 days. 12 capsule 3   No current facility-administered medications on file prior to visit.    Technical Summary: A multichannel digital EEG recording measured by the international 10-20 system with electrodes applied with paste and impedances below 5000 ohms performed in our laboratory with EKG monitoring in an awake and drowsy patient.  Photic  stimulation was performed.  The digital EEG was referentially recorded, reformatted, and digitally filtered in a variety of bipolar and referential montages for optimal display.    Description: The patient is awake and drowsy during the recording.  During maximal wakefulness, there is a symmetric, medium voltage 10 Hz posterior dominant rhythm that attenuates with eye opening.  The record is symmetric.  During drowsiness,, there is an increase in theta slowing of the background.  Stage 2 sleep was not seen.  Photic stimulation did not elicit any abnormalities.  There were no epileptiform discharges or electrographic seizures seen.    EKG lead was unremarkable.  Impression: This awake and drowsy EEG is normal.    Clinical Correlation: A normal EEG does not exclude a clinical diagnosis of epilepsy.  If further clinical questions remain, prolonged EEG may be helpful.  Clinical correlation is advised.   Juliene Dunnings, DO

## 2024-02-21 ENCOUNTER — Other Ambulatory Visit (HOSPITAL_BASED_OUTPATIENT_CLINIC_OR_DEPARTMENT_OTHER): Payer: Self-pay

## 2024-02-22 ENCOUNTER — Other Ambulatory Visit: Payer: Self-pay

## 2024-02-22 ENCOUNTER — Other Ambulatory Visit (HOSPITAL_BASED_OUTPATIENT_CLINIC_OR_DEPARTMENT_OTHER): Payer: Self-pay

## 2024-02-22 NOTE — Telephone Encounter (Signed)
 LMOVM to call back.   Patient returned our call.   Patient advised of her Results.

## 2024-02-27 ENCOUNTER — Ambulatory Visit (INDEPENDENT_AMBULATORY_CARE_PROVIDER_SITE_OTHER): Admitting: Licensed Clinical Social Worker

## 2024-02-27 ENCOUNTER — Other Ambulatory Visit (HOSPITAL_BASED_OUTPATIENT_CLINIC_OR_DEPARTMENT_OTHER)

## 2024-02-27 DIAGNOSIS — F411 Generalized anxiety disorder: Secondary | ICD-10-CM | POA: Diagnosis not present

## 2024-02-27 DIAGNOSIS — F331 Major depressive disorder, recurrent, moderate: Secondary | ICD-10-CM | POA: Diagnosis not present

## 2024-02-27 NOTE — Progress Notes (Signed)
 [facility] Counselor/Therapist Progress Note  Patient ID: [name], MRN: [id number]    Date: 02/27/24  Time Spent: 1101  am - 1200 pm : 59 Minutes  Treatment Type: Individual Therapy.  Reported Symptoms: Patient reports that she has been in therapy several times over the years. Patient reports that her daughter has not been very good. It remains strained. She has two grandchildren and rarely gets to see them. Patient reports that there is a long history of problems. Patient reports that her daughter got married and never spoke to her for 5 years without any explanation.   Mental Status Exam: Appearance:  Casual     Behavior: Appropriate  Motor: Normal  Speech/Language:  Clear and Coherent  Affect: Appropriate  Mood: normal  Thought process: normal  Thought content:   WNL  Sensory/Perceptual disturbances:   WNL  Orientation: oriented to person, place, time/date, situation, day of week, month of year, and year  Attention: Good  Concentration: Good  Memory: WNL  Fund of knowledge:  Good  Insight:   Good  Judgment:  Good  Impulse Control: Good    Risk Assessment: Danger to Self:  No Self-injurious Behavior: No Danger to Others: No Duty to Warn:no Physical Aggression / Violence:No  Access to Firearms a concern: No  Gang Involvement:No    Subjective:    [name] participated from office, located at [facility] in person. Atisha consented to treatment. Therapist participated from office located at [facility].   Jacquelyne presented for her session in a positive mood. Tyreona reports that she had a conversations with her good friend about her feelings over the situation with Arland. She states that although her friend was frustrated she was also understanding and they were able to work through their situation. Evadean shared that she sent a message to her daughter on two different ocassion's and got no response. She reports that it  brought up multiple feelings of rejection. Patient was inquiring how to cope with the rejection she's feels.   Clinician actively listened and provided encouragement and verbal feedback. Clinician and patient processed her feelings and thoughts about rejection and how it has affected her. Clinician and patient discussed that she can only be responsible for herself and how she responds to her daughters behavior. Clinician and patient discussed doing what patient feels is right and reaching out to her daughter and grand children even though she may not get a response.  Lenyx was fully engaged in session  discussion. Alleene was polite and cooperative. Bettyann will utilize coping skills to assist in improving her depression symptoms. Auset will continue to engage in bi weekly therapy sessions. Treatment planning to be reviewed by 01/27/2025.  Interventions: Cognitive Behavioral Therapy, Assertiveness/Communication, and Solution-Oriented/Positive Psychology  Diagnosis: Major Depressive Disorder, recurrent moderate, Generalized Anxiety Disorder   Damien Junk MSW, LCSW/DATE 02/27/2024

## 2024-02-28 ENCOUNTER — Other Ambulatory Visit (HOSPITAL_BASED_OUTPATIENT_CLINIC_OR_DEPARTMENT_OTHER)

## 2024-02-28 ENCOUNTER — Ambulatory Visit
Admission: RE | Admit: 2024-02-28 | Discharge: 2024-02-28 | Disposition: A | Discharge status: Home / Self Care | Source: Ambulatory Visit | Attending: Neurology

## 2024-02-28 ENCOUNTER — Encounter (HOSPITAL_BASED_OUTPATIENT_CLINIC_OR_DEPARTMENT_OTHER): Payer: Self-pay

## 2024-02-28 DIAGNOSIS — R748 Abnormal levels of other serum enzymes: Secondary | ICD-10-CM | POA: Diagnosis not present

## 2024-02-28 DIAGNOSIS — R4020 Unspecified coma: Secondary | ICD-10-CM

## 2024-02-28 DIAGNOSIS — M47896 Other spondylosis, lumbar region: Secondary | ICD-10-CM | POA: Diagnosis not present

## 2024-02-28 DIAGNOSIS — G43009 Migraine without aura, not intractable, without status migrainosus: Secondary | ICD-10-CM

## 2024-02-28 DIAGNOSIS — G930 Cerebral cysts: Secondary | ICD-10-CM | POA: Diagnosis not present

## 2024-02-29 LAB — COMPREHENSIVE METABOLIC PANEL WITH GFR
ALT: 14 IU/L (ref 0–32)
AST: 18 IU/L (ref 0–40)
Albumin: 4.4 g/dL (ref 3.8–4.8)
Alkaline Phosphatase: 59 IU/L (ref 44–121)
BUN/Creatinine Ratio: 8 — ABNORMAL LOW (ref 12–28)
BUN: 7 mg/dL — ABNORMAL LOW (ref 8–27)
Bilirubin Total: 0.3 mg/dL (ref 0.0–1.2)
CO2: 19 mmol/L — ABNORMAL LOW (ref 20–29)
Calcium: 9.6 mg/dL (ref 8.7–10.3)
Chloride: 95 mmol/L — ABNORMAL LOW (ref 96–106)
Creatinine, Ser: 0.91 mg/dL (ref 0.57–1.00)
Globulin, Total: 2.1 g/dL (ref 1.5–4.5)
Glucose: 100 mg/dL — ABNORMAL HIGH (ref 70–99)
Potassium: 4.8 mmol/L (ref 3.5–5.2)
Sodium: 128 mmol/L — ABNORMAL LOW (ref 134–144)
Total Protein: 6.5 g/dL (ref 6.0–8.5)
eGFR: 67 mL/min/1.73 (ref >59)

## 2024-03-03 ENCOUNTER — Telehealth: Payer: Self-pay | Admitting: Adult Health

## 2024-03-03 ENCOUNTER — Other Ambulatory Visit (HOSPITAL_BASED_OUTPATIENT_CLINIC_OR_DEPARTMENT_OTHER): Payer: Self-pay

## 2024-03-03 DIAGNOSIS — M47896 Other spondylosis, lumbar region: Secondary | ICD-10-CM | POA: Diagnosis not present

## 2024-03-03 NOTE — Telephone Encounter (Signed)
 Pt had labs drawn 02/28/24. Available in Epic. Reports sodium is 128. Reports d/t Neurontin , Topamax , and Lunesta . She has been on these medications for a while and sodium 2 months ago was normal. Last seen 6/19 with no medication changes. Has FU 9/16.

## 2024-03-03 NOTE — Telephone Encounter (Signed)
 Pt called at 1:45pm and LM. She would like to talk to the nurse about some side effects she is having. Has some abnormal lab values.

## 2024-03-04 ENCOUNTER — Ambulatory Visit (HOSPITAL_BASED_OUTPATIENT_CLINIC_OR_DEPARTMENT_OTHER): Payer: Self-pay | Admitting: Obstetrics & Gynecology

## 2024-03-05 DIAGNOSIS — M79674 Pain in right toe(s): Secondary | ICD-10-CM | POA: Diagnosis not present

## 2024-03-06 DIAGNOSIS — M47896 Other spondylosis, lumbar region: Secondary | ICD-10-CM | POA: Diagnosis not present

## 2024-03-10 ENCOUNTER — Ambulatory Visit: Payer: Self-pay | Admitting: Neurology

## 2024-03-12 ENCOUNTER — Ambulatory Visit (INDEPENDENT_AMBULATORY_CARE_PROVIDER_SITE_OTHER): Admitting: Licensed Clinical Social Worker

## 2024-03-12 DIAGNOSIS — F411 Generalized anxiety disorder: Secondary | ICD-10-CM | POA: Diagnosis not present

## 2024-03-12 DIAGNOSIS — F331 Major depressive disorder, recurrent, moderate: Secondary | ICD-10-CM | POA: Diagnosis not present

## 2024-03-12 NOTE — Progress Notes (Signed)
 [facility] Counselor/Therapist Progress Note  Patient ID: [name], MRN: [id number]    Date: 03/12/24  Time Spent: 1100  am - 1158 am : 58 Minutes  Treatment Type: Individual Therapy.  Reported Symptoms: Patient reports that she has been in therapy several times over the years. Patient reports that her daughter has not been very good. It remains strained. She has two grandchildren and rarely gets to see them. Patient reports that there is a long history of problems. Patient reports that her daughter got married and never spoke to her for 5 years without any explanation.   Mental Status Exam: Appearance:  Casual     Behavior: Appropriate  Motor: Normal  Speech/Language:  Clear and Coherent  Affect: Appropriate  Mood: normal  Thought process: normal  Thought content:   WNL  Sensory/Perceptual disturbances:   WNL  Orientation: oriented to person, place, time/date, situation, day of week, month of year, and year  Attention: Good  Concentration: Good  Memory: WNL  Fund of knowledge:  Good  Insight:   Good  Judgment:  Good  Impulse Control: Good    Risk Assessment: Danger to Self:  No Self-injurious Behavior: No Danger to Others: No Duty to Warn:no Physical Aggression / Violence:No  Access to Firearms a concern: No  Gang Involvement:No    Subjective:    [name] participated from office, located at [facility] in person. Mc consented to treatment. Therapist participated from office located at [facility].   [name] presented for her session in a positive mood. [name] reports that she has struggled due to not hearing from her daughter. She reports that she continues to reach out every other week but no response. Patient states that when she comes to therapy she does well for a few days but then begins to get very emotional. She requested to come weekly until she gets her emotions under control. Clinician and patient discussed  patients childhood and her relationship with her parents and siblings. Patient was able to identify the way her her mother made her feels and how that has affected her throughout her life. Patient reports that her daughter brings many of those feelings back by avoiding her and hence the feeling of abandonment.   Clinician actively listened providing support and encouragement via verbal feedback. Clinician ask open ended questions promoting thought from patient. Clinician and patient processed patients feelings and how although she feels hurt when her daughter doesn't respond, at least she is doing her part by putting forth effort and knowing that she is trying to have contact and a relationship. Clinician and patient discussed the feelings of abandonment and identified positive self talk as a way to begin to work through some of those feelings.  Patient presented motivated for treatment and fully engaged in discussion. Patient will continue to utilize coping skills to decrease decrease and anxiety symptoms. Patient will continue to engage in weekly therapy until we see an improvement in mood. Treatment planning to be reviewed by 01/27/2025.  Interventions: Cognitive Behavioral Therapy, Assertiveness/Communication, Motivational Interviewing, Solution-Oriented/Positive Psychology, and Family Systems  Diagnosis: Major Depressive Disorder, recurrent, moderate/Generalized Anxiety Disorder    Damien Junk MSW, LCSW/DATE 03/12/2024

## 2024-03-17 DIAGNOSIS — M47896 Other spondylosis, lumbar region: Secondary | ICD-10-CM | POA: Diagnosis not present

## 2024-03-18 ENCOUNTER — Other Ambulatory Visit (HOSPITAL_BASED_OUTPATIENT_CLINIC_OR_DEPARTMENT_OTHER): Payer: Self-pay

## 2024-03-18 ENCOUNTER — Other Ambulatory Visit: Payer: Self-pay

## 2024-03-18 MED ORDER — NORTRIPTYLINE HCL 10 MG PO CAPS
10.0000 mg | ORAL_CAPSULE | Freq: Every day | ORAL | 0 refills | Status: DC
  Filled 2024-03-18: qty 30, 30d supply, fill #0

## 2024-03-20 ENCOUNTER — Ambulatory Visit: Admitting: Licensed Clinical Social Worker

## 2024-03-24 ENCOUNTER — Other Ambulatory Visit (HOSPITAL_BASED_OUTPATIENT_CLINIC_OR_DEPARTMENT_OTHER): Payer: Self-pay

## 2024-03-24 ENCOUNTER — Other Ambulatory Visit: Payer: Self-pay | Admitting: Adult Health

## 2024-03-24 ENCOUNTER — Telehealth: Payer: Self-pay

## 2024-03-24 NOTE — Telephone Encounter (Signed)
 [facility] from received.  Patient last seen for Loss of Consciousness 02/13/24 and migraines on 07/2023.   Form on Dr.Jaffe desk.

## 2024-03-25 ENCOUNTER — Other Ambulatory Visit: Payer: Self-pay

## 2024-03-25 ENCOUNTER — Other Ambulatory Visit (HOSPITAL_BASED_OUTPATIENT_CLINIC_OR_DEPARTMENT_OTHER): Payer: Self-pay

## 2024-03-25 MED ORDER — TOPIRAMATE 25 MG PO TABS
50.0000 mg | ORAL_TABLET | Freq: Every day | ORAL | 0 refills | Status: DC
  Filled 2024-03-25 (×2): qty 100, 50d supply, fill #0

## 2024-04-01 DIAGNOSIS — L650 Telogen effluvium: Secondary | ICD-10-CM | POA: Diagnosis not present

## 2024-04-01 DIAGNOSIS — L649 Androgenic alopecia, unspecified: Secondary | ICD-10-CM | POA: Diagnosis not present

## 2024-04-02 ENCOUNTER — Ambulatory Visit (INDEPENDENT_AMBULATORY_CARE_PROVIDER_SITE_OTHER): Admitting: Licensed Clinical Social Worker

## 2024-04-02 DIAGNOSIS — F331 Major depressive disorder, recurrent, moderate: Secondary | ICD-10-CM | POA: Diagnosis not present

## 2024-04-02 DIAGNOSIS — F411 Generalized anxiety disorder: Secondary | ICD-10-CM

## 2024-04-02 NOTE — Progress Notes (Signed)
 [facility] Counselor/Therapist Progress Note  Patient ID: [name], MRN: [id number]    Date: 04/02/24  Time Spent: 0203  pm - 0300 pm : 57 Minutes  Treatment Type: Individual Therapy.  Reported Symptoms: Patient reports that she has been in therapy several times over the years. Patient reports that her daughter has not been very good. It remains strained. She has two grandchildren and rarely gets to see them. Patient reports that there is a long history of problems. Patient reports that her daughter got married and never spoke to her for 5 years without any explanation.   Mental Status Exam: Appearance:  Casual     Behavior: Appropriate  Motor: Normal  Speech/Language:  Clear and Coherent  Affect: Appropriate  Mood: normal  Thought process: normal  Thought content:   WNL  Sensory/Perceptual disturbances:   WNL  Orientation: oriented to person, place, time/date, situation, day of week, month of year, and year  Attention: Good  Concentration: Good  Memory: WNL  Fund of knowledge:  Good  Insight:   Good  Judgment:  Good  Impulse Control: Good    Risk Assessment: Danger to Self:  No Self-injurious Behavior: No Danger to Others: No Duty to Warn:no Physical Aggression / Violence:No  Access to Firearms a concern: No  Gang Involvement:No    Subjective:    [name] participated from office, located at [facility] in person. [name] consented to treatment. Therapist participated from office located at [facility].   [name] presented for her session reporting that she received a message back from her daughter. She reported that it again stated that patient had too high expectations for their relationship. [name] reports that she is going to send a birthday card in September and call her daughter. She reports that she will then not bother her again till October when it's her grandsons birthday. [name] reports that she wants to respect her  daughters wishes but cannot understand why she treats her so poorly. Patient states that she tries to let it go but she finds it very difficult when she doesn't know what has happened or what she has or hasn't done.  Clinician actively listened and processed with patient her concerns. Clinician encouraged patient to give her space and calmly let her know you're open to listening when they're ready to communicate. Reflect on your actions to see if you may have caused their reaction, and consider sending a message or email to express your willingness to discuss the issue without pressure. If they remain unwilling to communicate, you may need to respect their boundary by giving them more space or, in some cases, accepting that the relationship may be permanently altered. We discussed that she is doing the right thing by respecting the boundary and continuing to give her daughter space.   [name] was fully engaged in session and was motivated for treatment. [name] was insightful and recognized that she cannot make her daughter tell her what has happened and the more she pushed the further away she will push her. Patient is to use CBT, mindfulness and coping skills to help manage decrease symptoms associated with their diagnosis.  Treatment planning to be reviewed by 01/27/2025.  Interventions: Cognitive Behavioral Therapy, Motivational Interviewing, and Solution-Oriented/Positive Psychology  Diagnosis: Generalized Anxiety Disorder, Major Depressive Disorder, recurrent moderate    Damien Junk MSW, LCSW/DATE 04/02/2024

## 2024-04-22 ENCOUNTER — Other Ambulatory Visit (HOSPITAL_BASED_OUTPATIENT_CLINIC_OR_DEPARTMENT_OTHER): Payer: Self-pay

## 2024-04-22 ENCOUNTER — Encounter: Payer: Self-pay | Admitting: Adult Health

## 2024-04-22 ENCOUNTER — Ambulatory Visit (INDEPENDENT_AMBULATORY_CARE_PROVIDER_SITE_OTHER): Admitting: Adult Health

## 2024-04-22 DIAGNOSIS — F331 Major depressive disorder, recurrent, moderate: Secondary | ICD-10-CM

## 2024-04-22 DIAGNOSIS — G4700 Insomnia, unspecified: Secondary | ICD-10-CM | POA: Diagnosis not present

## 2024-04-22 MED ORDER — TOPIRAMATE 25 MG PO TABS
50.0000 mg | ORAL_TABLET | Freq: Every day | ORAL | 3 refills | Status: AC
  Filled 2024-04-22 – 2024-05-09 (×2): qty 200, 100d supply, fill #0
  Filled 2024-08-17: qty 200, 100d supply, fill #1

## 2024-04-22 MED ORDER — ESZOPICLONE 2 MG PO TABS
2.0000 mg | ORAL_TABLET | Freq: Every evening | ORAL | 2 refills | Status: AC
  Filled 2024-04-22 – 2024-05-25 (×2): qty 100, 100d supply, fill #0
  Filled 2024-09-07: qty 100, 100d supply, fill #1

## 2024-04-22 MED ORDER — GABAPENTIN 600 MG PO TABS
600.0000 mg | ORAL_TABLET | Freq: Every evening | ORAL | 3 refills | Status: AC
  Filled 2024-04-22 – 2024-05-22 (×2): qty 100, 100d supply, fill #0
  Filled 2024-09-03: qty 100, 100d supply, fill #1

## 2024-04-22 MED ORDER — PRAZOSIN HCL 2 MG PO CAPS
2.0000 mg | ORAL_CAPSULE | Freq: Every day | ORAL | 3 refills | Status: AC
  Filled 2024-04-22 – 2024-06-01 (×2): qty 100, 100d supply, fill #0
  Filled 2024-09-08: qty 100, 100d supply, fill #1

## 2024-04-22 MED ORDER — BUPROPION HCL ER (XL) 300 MG PO TB24
300.0000 mg | ORAL_TABLET | Freq: Every day | ORAL | 3 refills | Status: AC
  Filled 2024-04-22 – 2024-05-19 (×2): qty 100, 100d supply, fill #0
  Filled 2024-08-25: qty 100, 100d supply, fill #1

## 2024-04-22 MED ORDER — BUPROPION HCL ER (XL) 150 MG PO TB24
150.0000 mg | ORAL_TABLET | Freq: Every day | ORAL | 3 refills | Status: AC
  Filled 2024-04-22 – 2024-05-19 (×2): qty 100, 100d supply, fill #0
  Filled 2024-09-07: qty 100, 100d supply, fill #1

## 2024-04-22 NOTE — Progress Notes (Signed)
 [name] [id number] 03-28-1951 73 y.o.  Subjective:   Patient ID:  [name] is a 73 y.o. (DOB 08-Mar-1951) female.  Chief Complaint: No chief complaint on file.   HPI [name] presents to the office today for follow-up of MDD and insomnia.   Describes mood today as ok. Pleasant. Denies tearfulness. Mood symptoms - reports decreased depression. Denies anxiety and irritability. Reports improved interest and motivation. Denies panic attacks. Reports rumination. Denies worry over thinking. Reports mood is stable. Stating overall, I feel like I'm doing pretty good. Feels like current medications are helpful. Taking medications as prescribed.   Energy levels stable. Active, has a regular exercise routine. Enjoys some usual interests and activities. Lives alone - 2 cats. Has 2 children son 34 [name], daughter 64 in [place] and 2 grandchildren.  Appetite adequate. Weight loss - 138 to 145 pounds. Reports sleeping better some nights than others.  Focus and concentration stable. Completing tasks. Managing aspects of household. Retired. Denies SI or HI.  Denies AH or VH. Denies self harm. Denies substance use.  Reports alcohol use.  Previous medication trials: Zoloft, Lamictal  - low sodium, Celexa , Wellbutrin , Lexapro, Effexor , Vraylar   AUDIT    Flowsheet Row Office Visit from 10/16/2023 in [facility]  Alcohol Use Disorder Identification Test Final Score (AUDIT) 3    PHQ2-9    Flowsheet Row Clinical Support from 10/09/2023 in [facility] Clinical Support from 10/03/2022 in [facility] Office Visit from 05/08/2022 in [facility] for [facility] Office Visit from 04/03/2022 in [facility] for [facility] at [facility] Office Visit from 03/02/2022 in [facility] for [facility] at [facility] Total Score 6 3 0 0 0  PHQ-9 Total Score 12 6 -- --  --   Flowsheet Row ED from 02/01/2024 in [facility] Emergency Department at [facility] UC from 11/15/2023 in [facility] from 06/23/2023 in [facility] at [facility] RISK CATEGORY No Risk No Risk No Risk     Review of Systems:  Review of Systems  Musculoskeletal:  Negative for gait problem.  Neurological:  Negative for tremors.  Psychiatric/Behavioral:         Please refer to HPI    Medications: I have reviewed the patient's current medications.  Current Outpatient Medications  Medication Sig Dispense Refill   topiramate  (TOPAMAX ) 25 MG tablet Take 2 tablets (50 mg total) by mouth daily. 100 tablet 0   buPROPion  (WELLBUTRIN  XL) 150 MG 24 hr tablet Take 1 tablet (150 mg total) by mouth daily. 90 tablet 3   buPROPion  (WELLBUTRIN  XL) 300 MG 24 hr tablet Take 1 tablet (300 mg total) by mouth daily. 90 tablet 3   eszopiclone  (LUNESTA ) 2 MG TABS tablet Take 1 tablet (2 mg total) by mouth at bedtime. 90 tablet 0   Evolocumab  (REPATHA  SURECLICK) 140 MG/ML SOAJ Inject 140 mg into the skin every 14 (fourteen) days. 6 mL 3   gabapentin  (NEURONTIN ) 600 MG tablet Take 1 tablet (600 mg total) by mouth every evening. 90 tablet 0   nortriptyline  (PAMELOR ) 10 MG capsule Take 1 capsule (10 mg total) by mouth at bedtime. 30 capsule 0   ondansetron  (ZOFRAN -ODT) 4 MG disintegrating tablet Dissolve 1-2 tablets under the tongue every 6 hours as needed 30 tablet 2   pantoprazole  (PROTONIX ) 40 MG tablet Take 1 tablet (40 mg total) by mouth  2 (two) times daily. 180 tablet 3   prazosin  (MINIPRESS ) 2 MG capsule Take 1 capsule (2 mg total) by mouth at bedtime. 90 capsule 0   topiramate  (TOPAMAX ) 25 MG tablet Take 50 mg by mouth daily.     valACYclovir  (VALTREX ) 1000 MG tablet Take 2 tablets (2,000 mg total) by mouth at first sign of outbreak and then 2 tablets 12 hours later. (Patient taking differently: Take 2,000 mg by mouth as needed. Only after dentist  appointments) 4 tablet 6   Vitamin D , Ergocalciferol , (DRISDOL ) 1.25 MG (50000 UNIT) CAPS capsule Take 1 capsule by mouth every 7 days. 12 capsule 3   No current facility-administered medications for this visit.    Medication Side Effects: None  Allergies:  Allergies  Allergen Reactions   Bee Venom Swelling    Yellow jackets   Statins Other (See Comments)    Muscle pains  CRESTOR , LIPITOR    Past Medical History:  Diagnosis Date   Anemia    Anxiety    Arthritis    Complication of anesthesia    past hx. laryngospasm following 2 past surgeries, not recent   Depression    Exertional dyspnea 08/04/2019   GERD (gastroesophageal reflux disease)    Hiatal hernia 10/17/2019   Hyperlipidemia    Insomnia    periodically uses med.   Perforated sigmoid colon (HCC) 04/28/2014   during colonoscopy, rupture colonbecame septic, has colostomy and open surgical wound   Pneumonia    Pre-diabetes    Prediabetes 08/09/2017   Primary hypertension 01/05/2023   Suicide attempt (HCC)    Vitamin D  deficiency     Past Medical History, Surgical history, Social history, and Family history were reviewed and updated as appropriate.   Please see review of systems for further details on the patient's review from today.   Objective:   Physical Exam:  LMP 08/07/1998   Physical Exam Constitutional:      General: She is not in acute distress. Musculoskeletal:        General: No deformity.  Neurological:     Mental Status: She is alert and oriented to person, place, and time.     Coordination: Coordination normal.  Psychiatric:        Attention and Perception: Attention and perception normal. She does not perceive auditory or visual hallucinations.        Mood and Affect: Mood normal. Mood is not anxious or depressed. Affect is not labile, blunt, angry or inappropriate.        Speech: Speech normal.        Behavior: Behavior normal.        Thought Content: Thought content normal. Thought  content is not paranoid or delusional. Thought content does not include homicidal or suicidal ideation. Thought content does not include homicidal or suicidal plan.        Cognition and Memory: Cognition and memory normal.        Judgment: Judgment normal.     Comments: Insight intact     Lab Review:     Component Value Date/Time   NA 128 (L) 02/28/2024 0940   K 4.8 02/28/2024 0940   CL 95 (L) 02/28/2024 0940   CO2 19 (L) 02/28/2024 0940   GLUCOSE 100 (H) 02/28/2024 0940   GLUCOSE 108 (H) 05/23/2023 1436   BUN 7 (L) 02/28/2024 0940   CREATININE 0.91 02/28/2024 0940   CREATININE 0.99 08/08/2017 1629   CALCIUM  9.6 02/28/2024 0940   PROT 6.5 02/28/2024 0940  ALBUMIN 4.4 02/28/2024 0940   AST 18 02/28/2024 0940   ALT 14 02/28/2024 0940   ALKPHOS 59 02/28/2024 0940   BILITOT 0.3 02/28/2024 0940   GFRNONAA 43 (L) 09/16/2022 1415   GFRNONAA 76 04/14/2014 1427   GFRAA 72 08/27/2019 1112   GFRAA 88 04/14/2014 1427       Component Value Date/Time   WBC 6.4 01/03/2024 1156   WBC 7.9 05/23/2023 1436   RBC 3.97 01/03/2024 1156   RBC 4.50 05/23/2023 1436   HGB 12.9 01/03/2024 1156   HGB 11.9 (L) 06/14/2015 1528   HCT 39.2 01/03/2024 1156   PLT 300 01/03/2024 1156   MCV 99 (H) 01/03/2024 1156   MCH 32.5 01/03/2024 1156   MCH 31.8 09/16/2022 1415   MCHC 32.9 01/03/2024 1156   MCHC 33.0 05/23/2023 1436   RDW 11.8 01/03/2024 1156   LYMPHSABS 1.7 01/03/2024 1156   MONOABS 0.5 05/23/2023 1436   EOSABS 0.2 01/03/2024 1156   BASOSABS 0.1 01/03/2024 1156    No results found for: POCLITH, LITHIUM   No results found for: PHENYTOIN, PHENOBARB, VALPROATE, CBMZ   .res Assessment: Plan:    Treatment Plan/Recommendations:   Plan:  PDMP reviewed  Will continue: Gabapentin  600mg  at hs Minipress  2mg  at hs Lunesta  2mg  at hs Wellbutrin  XL 450mg  daily - denies seizure history. Topamax  25mg  - 2 at hs  RTC 3 months  25 minutes spent dedicated to the care of this  patient on the date of this encounter to include pre-visit review of records, ordering of medication, post visit documentation, and face-to-face time with the patient discussing MDD and insomnia. Discussed continuing current medication regimen.  Patient advised to contact office with any questions, adverse effects, or acute worsening in signs and symptoms.  There are no diagnoses linked to this encounter.   Please see After Visit Summary for patient specific instructions.  Future Appointments  Date Time Provider Department Center  04/23/2024  3:00 PM Schuyler Damien HERO [facility] [id number]  05/06/2024  2:00 PM Schuyler Damien HERO [facility] [address]  05/13/2024  2:00 PM Schuyler Damien HERO [facility] [address]  05/27/2024  2:00 PM Schuyler Damien HERO [facility] [address]  08/21/2024  9:50 AM Skeet Juliene SAUNDERS, DO [facility] None  10/14/2024  3:30 PM P[facility]-ANNUAL WELLNESS VISIT [facility] [id number]  10/16/2024  2:00 PM Vita Morrow, MD [facility] [address]  01/12/2025  1:55 PM Cleotilde Ronal RAMAN, MD [facility] [phone] [address]    No orders of the defined types were placed in this encounter.   -------------------------------

## 2024-04-23 ENCOUNTER — Ambulatory Visit (INDEPENDENT_AMBULATORY_CARE_PROVIDER_SITE_OTHER): Admitting: Licensed Clinical Social Worker

## 2024-04-23 ENCOUNTER — Other Ambulatory Visit (HOSPITAL_BASED_OUTPATIENT_CLINIC_OR_DEPARTMENT_OTHER): Payer: Self-pay

## 2024-04-23 DIAGNOSIS — F331 Major depressive disorder, recurrent, moderate: Secondary | ICD-10-CM

## 2024-04-23 DIAGNOSIS — F411 Generalized anxiety disorder: Secondary | ICD-10-CM

## 2024-04-23 MED ORDER — COMIRNATY 30 MCG/0.3ML IM SUSY
0.3000 mL | PREFILLED_SYRINGE | Freq: Once | INTRAMUSCULAR | 0 refills | Status: AC
Start: 2024-04-23 — End: 2024-04-24
  Filled 2024-04-23: qty 0.3, 1d supply, fill #0

## 2024-04-23 MED ORDER — FLUZONE HIGH-DOSE 0.5 ML IM SUSY
0.5000 mL | PREFILLED_SYRINGE | Freq: Once | INTRAMUSCULAR | 0 refills | Status: AC
Start: 2024-04-23 — End: 2024-04-24
  Filled 2024-04-23: qty 0.5, 1d supply, fill #0

## 2024-04-24 NOTE — Progress Notes (Addendum)
 [facility] Counselor/Therapist Progress Note  Patient ID: [name], MRN: [id number]    Date: 04/23/2024  Time Spent: 0302  pm - 0358 pm : 56 Minutes  Treatment Type: Individual Therapy.  Reported Symptoms: Patient reports that she has been in therapy several times over the years. Patient reports that her daughter has not been very good. It remains strained. She has two grandchildren and rarely gets to see them. Patient reports that there is a long history of problems. Patient reports that her daughter got married and never spoke to her for 5 years without any explanation.   Mental Status Exam: Appearance:  Casual     Behavior: Appropriate  Motor: Normal  Speech/Language:  Clear and Coherent  Affect: Appropriate  Mood: normal  Thought process: normal  Thought content:   WNL  Sensory/Perceptual disturbances:   WNL  Orientation: oriented to person, place, time/date, situation, day of week, month of year, and year  Attention: Good  Concentration: Good  Memory: WNL  Fund of knowledge:  Good  Insight:   Good  Judgment:  Good  Impulse Control: Good    Risk Assessment: Danger to Self:  No Self-injurious Behavior: No Danger to Others: No Duty to Warn:no Physical Aggression / Violence:No  Access to Firearms a concern: No  Gang Involvement:No    Subjective:    [name] participated from office, located at [facility] in person. [name] consented to treatment. Therapist participated from office located at [facility].   [name] presented for her session and was polite and cooperative. [name] was eager to share that she attempted to call her daughter on her birthday and sent her a card. She reports that her daughter didn't answer the phone as she already assumed she would. She states that later her daughter did send a text thanking her for the card. [name] states that she remains concerned about what has happened that her daughter has withdrawn. [name]  states that she has been having dreams and waking up at night thinking about the relationship with her daughter. She states that the most concerning thing is that she believed they will be coming to [place] to patients ex husbands home for Christmas. She reports that she isn't sure how she will handle it they come and her daughter doesn't make time to come to see her.   Clinician actively listened and provided support and feedback. Clinician processed with patient her concerns and thoughts. Clinician encouraged patient to get a nice journal and begin writing her thoughts to her daughter at night when she wakes up and is unable to sleep. Clinician and patient discussed that this can be effective in a number of ways. Clinician provided a variety of prompts including:  Prompts for self-reflection What emotions does my adult child's behavior trigger in me (e.g., anger, hurt, confusion, guilt, shame)? Where do I feel these sensations in my body? What is the internal story I tell myself about our relationship? Are there other ways to interpret the situation? What did I envision my relationship with my adult child would be like at this stage of their life? What does acceptance of our current reality mean to me? What are my biggest worries about our relationship, and which of these can I control? What coping strategies can I use for the things I cannot control? What is my role in our family system? How might old communication habits or expectations from that role be maintaining the problem? When have I seen my  adult child show bravery or handle a difficult situation well? Remembering their past victories can help you encourage them today. Are there recurring patterns in our disagreements? What triggers these automatic reactions in me, and how can I respond differently?    [name] was fully engaged in session and is transparent about her concerns and hurt she has endured from her daughter. Patient reports that talking to  her daughter has never been effective so she feels the journal writing may be a good idea. [name] will utilize coping skills to cope with symptoms of depression and anxiety. [name] will continue to engage in bi weekly CBT therapy sessions. Treatment planning to be reviewed by 01/27/2025.  Interventions: Cognitive Behavioral Therapy, Dialectical Behavioral Therapy, Assertiveness/Communication, Mindfulness Meditation, Motivational Interviewing, Solution-Oriented/Positive Psychology, Insight-Oriented, Family Systems, and Interpersonal  Diagnosis: Major Depressive Disorder, recurrent moderate, Generalized Anxiety Disorder    Damien Junk MSW, LCSW/DATE 04/23/2024

## 2024-04-30 ENCOUNTER — Other Ambulatory Visit (HOSPITAL_BASED_OUTPATIENT_CLINIC_OR_DEPARTMENT_OTHER): Payer: Self-pay

## 2024-04-30 DIAGNOSIS — L648 Other androgenic alopecia: Secondary | ICD-10-CM | POA: Diagnosis not present

## 2024-04-30 MED ORDER — MINOXIDIL 2.5 MG PO TABS
ORAL_TABLET | ORAL | 0 refills | Status: DC
  Filled 2024-04-30: qty 75, 90d supply, fill #0
  Filled 2024-07-24: qty 75, fill #1

## 2024-05-06 ENCOUNTER — Ambulatory Visit: Admitting: Licensed Clinical Social Worker

## 2024-05-09 ENCOUNTER — Other Ambulatory Visit (HOSPITAL_BASED_OUTPATIENT_CLINIC_OR_DEPARTMENT_OTHER): Payer: Self-pay

## 2024-05-09 ENCOUNTER — Other Ambulatory Visit: Payer: Self-pay

## 2024-05-12 ENCOUNTER — Other Ambulatory Visit (HOSPITAL_BASED_OUTPATIENT_CLINIC_OR_DEPARTMENT_OTHER): Payer: Self-pay | Admitting: Obstetrics & Gynecology

## 2024-05-12 DIAGNOSIS — E559 Vitamin D deficiency, unspecified: Secondary | ICD-10-CM

## 2024-05-13 ENCOUNTER — Ambulatory Visit: Admitting: Licensed Clinical Social Worker

## 2024-05-13 ENCOUNTER — Other Ambulatory Visit (HOSPITAL_BASED_OUTPATIENT_CLINIC_OR_DEPARTMENT_OTHER): Payer: Self-pay

## 2024-05-13 DIAGNOSIS — F331 Major depressive disorder, recurrent, moderate: Secondary | ICD-10-CM

## 2024-05-13 DIAGNOSIS — F411 Generalized anxiety disorder: Secondary | ICD-10-CM

## 2024-05-13 MED ORDER — VITAMIN D (ERGOCALCIFEROL) 1.25 MG (50000 UNIT) PO CAPS
50000.0000 [IU] | ORAL_CAPSULE | ORAL | 3 refills | Status: AC
  Filled 2024-05-13: qty 12, 84d supply, fill #0
  Filled 2024-07-31 – 2024-08-13 (×2): qty 12, 84d supply, fill #1

## 2024-05-14 NOTE — Progress Notes (Signed)
 [facility] Counselor/Therapist Progress Note  Patient ID: [name], MRN: [id number]    Date: 05/13/2024  Time Spent: 0204  pm - 0300 pm : 56 Minutes  Treatment Type: Individual Therapy.  Reported Symptoms: Patient reports that she has been in therapy several times over the years. Patient reports that her daughter has not been very good. It remains strained. She has two grandchildren and rarely gets to see them. Patient reports that there is a long history of problems. Patient reports that her daughter got married and never spoke to her for 5 years without any explanation.   Mental Status Exam: Appearance:  Casual     Behavior: Appropriate  Motor: Normal  Speech/Language:  Clear and Coherent  Affect: Appropriate  Mood: normal  Thought process: normal  Thought content:   WNL  Sensory/Perceptual disturbances:   WNL  Orientation: oriented to person, place, time/date, situation, day of week, month of year, and year  Attention: Good  Concentration: Good  Memory: WNL  Fund of knowledge:  Good  Insight:   Good  Judgment:  Good  Impulse Control: Good    Risk Assessment: Danger to Self:  No Self-injurious Behavior: No Danger to Others: No Duty to Warn:no Physical Aggression / Violence:No  Access to Firearms a concern: No  Gang Involvement:No    Subjective:    [name] participated from office, located at [facility] in person. [name] consented to treatment. Therapist participated from office located at [facility]   [name] presented for her session smiling and happy. She was eager to show pictures to Clinician of her grandchildren. She was happy that her daughter responded to her text and sent several pictures. She reports that she got to Face time her grandson and she got to see the video of him opening her present. [name] reports that this was good for her and her thoughts about the relationship with her daughter. She states that  she is learning to give the space her daughter has requested while still putting effort into the relationship.   Clinician actively listened and provided support and encouragement. Clinician and patient processed that the contact from patients daughter is progress and is indicative of her being willing to have some sort of relationship. We also processed that by patient giving her space has likely allowed her to process some of the feelings she has. Clinician encouraged patient to continue to work on her relationship with her daughter by casual contact.    [name] was fully engaged in session and is transparent about her concerns and hurt she has endured from her daughter. Patient reports that talking to her daughter has never been effective so she feels the journal writing may be a good idea. [name] will utilize coping skills to cope with symptoms of depression and anxiety. [name] will continue to engage in bi weekly CBT therapy sessions. Treatment planning to be reviewed by 01/27/2025.   Interventions: Cognitive Behavioral Therapy, Dialectical Behavioral Therapy, Assertiveness/Communication, Mindfulness Meditation, Motivational Interviewing, Solution-Oriented/Positive Psychology, Insight-Oriented, Family Systems, and Interpersonal   Diagnosis: Major Depressive Disorder, recurrent moderate, Generalized Anxiety Disorder       [name] MSW, LCSW/DATE 05/13/2024

## 2024-05-19 ENCOUNTER — Other Ambulatory Visit (HOSPITAL_BASED_OUTPATIENT_CLINIC_OR_DEPARTMENT_OTHER): Payer: Self-pay

## 2024-05-22 ENCOUNTER — Other Ambulatory Visit: Payer: Self-pay

## 2024-05-22 ENCOUNTER — Other Ambulatory Visit (HOSPITAL_BASED_OUTPATIENT_CLINIC_OR_DEPARTMENT_OTHER): Payer: Self-pay

## 2024-05-25 ENCOUNTER — Other Ambulatory Visit (HOSPITAL_BASED_OUTPATIENT_CLINIC_OR_DEPARTMENT_OTHER): Payer: Self-pay

## 2024-05-26 ENCOUNTER — Other Ambulatory Visit: Payer: Self-pay

## 2024-05-26 ENCOUNTER — Other Ambulatory Visit (HOSPITAL_BASED_OUTPATIENT_CLINIC_OR_DEPARTMENT_OTHER): Payer: Self-pay

## 2024-05-27 ENCOUNTER — Ambulatory Visit: Admitting: Licensed Clinical Social Worker

## 2024-05-27 DIAGNOSIS — F331 Major depressive disorder, recurrent, moderate: Secondary | ICD-10-CM

## 2024-05-27 DIAGNOSIS — F411 Generalized anxiety disorder: Secondary | ICD-10-CM | POA: Diagnosis not present

## 2024-05-27 NOTE — Progress Notes (Signed)
 [facility] Counselor/Therapist Progress Note  Patient ID: Jo [name], MRN: [id number]    Date: 05/27/24  Time Spent: 0207  pm - 0300 pm : 53 Minutes  Treatment Type: Individual Therapy.  Reported Symptoms: Patient reports that she has been in therapy several times over the years. Patient reports that her daughter has not been very good. It remains strained. She has two grandchildren and rarely gets to see them. Patient reports that there is a long history of problems. Patient reports that her daughter got married and never spoke to her for 5 years without any explanation.   Mental Status Exam: Appearance:  Casual     Behavior: Appropriate  Motor: Normal  Speech/Language:  Clear and Coherent  Affect: Appropriate  Mood: normal  Thought process: normal  Thought content:   WNL  Sensory/Perceptual disturbances:   WNL  Orientation: oriented to person, place, time/date, situation, day of week, month of year, and year  Attention: Good  Concentration: Good  Memory: WNL  Fund of knowledge:  Good  Insight:   Good  Judgment:  Good  Impulse Control: Good    Risk Assessment: Danger to Self:  No Self-injurious Behavior: No Danger to Others: No Duty to Warn:no Physical Aggression / Violence:No  Access to Firearms a concern: No  Gang Involvement:No    Subjective:    Jo [name] participated from office, located at [facility] in person. Jo [name] consented to treatment. Therapist participated from office located at [facility].   Jo [name] presented for her session in a positive mood. She reports she just returned from the protest downtown. Jo [name] was worked up about her political stance and [employer]. Patient reports being down and not sure why. She reports feeling angry at her friend about her falling out with another friend. Jo [name] states that she is hurt because this friend has betrayed her multiple times. She also reports that she was  upset at her son because he broke plans they had made for several months. She reports that she was very hurt. Patient reports that both her anxiety and depression are increased and she has found that her mood is poor. [name] reports that it is the feeling of rejection.  Clinician actively listened and provided encouragement and positive feedback. Clinician and patient processed her emotions and concerns about her friend. We also discussed the hurt from her son and his girlfriend. We processed that setting boundaries can allow [name] for both parties. Patient and Clinician processed the importance of healthy communication and making needs known to others. We processed that she needs to work on not internalizing others poor behavior and choices.    Jo [name] was fully engaged in session and is transparent about her concerns and hurt she has endured from her daughter. Patient reports that talking to her daughter has never been effective so she feels the journal writing may be a good idea. Jo [name] will utilize coping skills to cope with symptoms of depression and anxiety. Jo [name] will continue to engage in bi weekly CBT therapy sessions. Treatment planning to be reviewed by 01/27/2025.   Interventions: Cognitive Behavioral Therapy, Dialectical Behavioral Therapy, Assertiveness/Communication, Mindfulness Meditation, Motivational Interviewing, Solution-Oriented/Positive Psychology, Insight-Oriented, Family Systems, and Interpersonal   Diagnosis: Major Depressive Disorder, recurrent moderate, Generalized Anxiety Disorder       Damien Junk MSW, LCSW/DATE 05/27/2024

## 2024-06-02 ENCOUNTER — Other Ambulatory Visit: Payer: Self-pay

## 2024-06-02 ENCOUNTER — Other Ambulatory Visit (HOSPITAL_BASED_OUTPATIENT_CLINIC_OR_DEPARTMENT_OTHER): Payer: Self-pay

## 2024-06-10 ENCOUNTER — Ambulatory Visit: Admitting: Licensed Clinical Social Worker

## 2024-06-10 DIAGNOSIS — F331 Major depressive disorder, recurrent, moderate: Secondary | ICD-10-CM | POA: Diagnosis not present

## 2024-06-10 DIAGNOSIS — F411 Generalized anxiety disorder: Secondary | ICD-10-CM
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
## 2024-06-17 DIAGNOSIS — M2042 Other hammer toe(s) (acquired), left foot: Secondary | ICD-10-CM | POA: Diagnosis not present

## 2024-06-17 DIAGNOSIS — M7741 Metatarsalgia, right foot: Secondary | ICD-10-CM | POA: Diagnosis not present

## 2024-06-17 DIAGNOSIS — M24574 Contracture, right foot: Secondary | ICD-10-CM | POA: Diagnosis not present

## 2024-06-17 DIAGNOSIS — M24575 Contracture, left foot: Secondary | ICD-10-CM | POA: Diagnosis not present

## 2024-06-17 DIAGNOSIS — M79672 Pain in left foot: Secondary | ICD-10-CM | POA: Diagnosis not present

## 2024-06-17 DIAGNOSIS — M7742 Metatarsalgia, left foot: Secondary | ICD-10-CM | POA: Diagnosis not present

## 2024-06-17 DIAGNOSIS — M79671 Pain in right foot: Secondary | ICD-10-CM | POA: Diagnosis not present

## 2024-06-17 DIAGNOSIS — M2041 Other hammer toe(s) (acquired), right foot: Secondary | ICD-10-CM | POA: Diagnosis not present

## 2024-06-24 ENCOUNTER — Ambulatory Visit (INDEPENDENT_AMBULATORY_CARE_PROVIDER_SITE_OTHER): Admitting: Licensed Clinical Social Worker

## 2024-06-24 DIAGNOSIS — F331 Major depressive disorder, recurrent, moderate: Secondary | ICD-10-CM | POA: Diagnosis not present

## 2024-06-24 DIAGNOSIS — F411 Generalized anxiety disorder: Secondary | ICD-10-CM | POA: Diagnosis not present
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
## 2024-07-08 ENCOUNTER — Ambulatory Visit: Admitting: Licensed Clinical Social Worker

## 2024-07-09 ENCOUNTER — Other Ambulatory Visit (HOSPITAL_BASED_OUTPATIENT_CLINIC_OR_DEPARTMENT_OTHER): Payer: Self-pay

## 2024-07-09 DIAGNOSIS — M7702 Medial epicondylitis, left elbow: Secondary | ICD-10-CM | POA: Diagnosis not present

## 2024-07-09 MED ORDER — PREDNISONE 10 MG (21) PO TBPK
ORAL_TABLET | ORAL | 0 refills | Status: DC
  Filled 2024-07-09: qty 21, 6d supply, fill #0

## 2024-07-11 DIAGNOSIS — H1131 Conjunctival hemorrhage, right eye: Secondary | ICD-10-CM | POA: Diagnosis not present

## 2024-07-19 ENCOUNTER — Other Ambulatory Visit: Payer: Self-pay | Admitting: Cardiovascular Disease

## 2024-07-19 DIAGNOSIS — E785 Hyperlipidemia, unspecified: Secondary | ICD-10-CM

## 2024-07-19 DIAGNOSIS — I700 Atherosclerosis of aorta: Secondary | ICD-10-CM

## 2024-07-21 ENCOUNTER — Other Ambulatory Visit (HOSPITAL_BASED_OUTPATIENT_CLINIC_OR_DEPARTMENT_OTHER): Payer: Self-pay

## 2024-07-21 MED ORDER — REPATHA SURECLICK 140 MG/ML ~~LOC~~ SOAJ
1.0000 mL | SUBCUTANEOUS | 3 refills | Status: AC
  Filled 2024-07-21: qty 6, 84d supply, fill #0

## 2024-07-22 ENCOUNTER — Ambulatory Visit: Admitting: Adult Health

## 2024-07-22 ENCOUNTER — Encounter: Payer: Self-pay | Admitting: Family Medicine

## 2024-07-22 ENCOUNTER — Ambulatory Visit (INDEPENDENT_AMBULATORY_CARE_PROVIDER_SITE_OTHER): Admitting: Family Medicine

## 2024-07-22 ENCOUNTER — Telehealth: Payer: Self-pay | Admitting: Pharmacy Technician

## 2024-07-22 ENCOUNTER — Other Ambulatory Visit (HOSPITAL_BASED_OUTPATIENT_CLINIC_OR_DEPARTMENT_OTHER): Payer: Self-pay

## 2024-07-22 ENCOUNTER — Encounter: Payer: Self-pay | Admitting: Adult Health

## 2024-07-22 ENCOUNTER — Ambulatory Visit: Admitting: Licensed Clinical Social Worker

## 2024-07-22 VITALS — BP 122/84 | HR 93 | Wt 148.6 lb

## 2024-07-22 DIAGNOSIS — G4700 Insomnia, unspecified: Secondary | ICD-10-CM

## 2024-07-22 DIAGNOSIS — M7702 Medial epicondylitis, left elbow: Secondary | ICD-10-CM

## 2024-07-22 DIAGNOSIS — F331 Major depressive disorder, recurrent, moderate: Secondary | ICD-10-CM | POA: Diagnosis not present

## 2024-07-22 DIAGNOSIS — F411 Generalized anxiety disorder: Secondary | ICD-10-CM | POA: Diagnosis not present

## 2024-07-22 DIAGNOSIS — E785 Hyperlipidemia, unspecified: Secondary | ICD-10-CM

## 2024-07-22 DIAGNOSIS — U071 COVID-19: Secondary | ICD-10-CM | POA: Diagnosis not present

## 2024-07-22 LAB — POC COVID19/FLU A&B COMBO
Covid Antigen, POC: POSITIVE — AB
Influenza A Antigen, POC: NEGATIVE
Influenza B Antigen, POC: NEGATIVE

## 2024-07-22 MED ORDER — METHYLPREDNISOLONE 4 MG PO TBPK
ORAL_TABLET | ORAL | 0 refills | Status: DC
  Filled 2024-07-22: qty 21, 6d supply, fill #0

## 2024-07-22 MED ORDER — FLUTICASONE PROPIONATE 50 MCG/ACT NA SUSP
2.0000 | Freq: Every day | NASAL | 6 refills | Status: DC
  Filled 2024-07-22: qty 16, 30d supply, fill #0

## 2024-07-22 NOTE — Addendum Note (Signed)
 Addended by: LATTIE CARLO BROCKS on: 07/22/2024 12:40 PM   Modules accepted: Orders
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
## 2024-07-22 NOTE — Progress Notes (Signed)
 Name: [name]   Date of Visit: 07/22/2024   Date of last visit with me: Visit date not found   CHIEF COMPLAINT:  Chief Complaint  Patient presents with   Acute Visit    Fluid in ears and sore throat, cough, nose is stopped up, congestion. Been going on for 2 weeks only gotten worse.        HPI:  Discussed the use of AI scribe software for clinical note transcription with the patient, who gave verbal consent to proceed.  History of Present Illness   [name] is a 73 year old female who presents with congestion, sore throat, and headaches.  She has been experiencing congestion, sore throat, and drainage for the past two weeks. She wakes up with a need to cough and a 'crackling' sensation in her ears. Headaches and occasional nausea are also present. She is unsure if these symptoms are related to a sinus infection.  She has not taken any steroids recently, although she has a prescription at home for a steroid pack intended for her golfer's elbow. The elbow pain has been present since Thanksgiving and flares up with activity, such as cleaning. She uses a brace to manage the pain during activities. She has a history of a knee replacement and previously had surgery for tennis elbow after unsuccessful steroid injections.  She notes experiencing lower back pain, particularly in the mornings, which she attributes to possibly being more sedentary recently. No history of using nasal sprays. She has never had COVID before and has been around many people recently without knowing she was infected.         OBJECTIVE:       10/09/2023    3:41 PM  Depression screen PHQ 2/9  Decreased Interest 3  Down, Depressed, Hopeless 3  PHQ - 2 Score 6  Altered sleeping 0  Tired, decreased energy 0  Change in appetite 3  Feeling bad or failure about yourself  2  Trouble concentrating 1  Moving slowly or fidgety/restless 0  Suicidal thoughts 0  PHQ-9 Score 12   Difficult  doing work/chores Somewhat difficult     Data saved with a previous flowsheet row definition     BP Readings from Last 3 Encounters:  07/22/24 122/84  02/13/24 134/77  02/07/24 136/68    BP 122/84   Pulse 93   Wt 148 lb 9.6 oz (67.4 kg)   LMP 08/07/1998   SpO2 97%   BMI 23.98 kg/m    Physical Exam          Physical Exam Constitutional:      Appearance: Normal appearance.  HENT:     Nose: Congestion and rhinorrhea present.  Neurological:     General: No focal deficit present.     Mental Status: She is alert and oriented to person, place, and time. Mental status is at baseline.     ASSESSMENT/PLAN:   Assessment & Plan COVID-19  Golfers elbow of left upper extremity    Assessment and Plan    COVID-19 Mild COVID-19 infection with persistent symptoms for two weeks. Symptoms not severe, no longer infectious. - Prescribed Steripred for symptom relief and sinus infection prevention. - Prescribed Flonase  for congestion relief. - Advised symptoms should improve by week's end; contact if not.  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 Symptoms manageable with activity modification and brace. - Prescribed Steripred for symptom relief. - Advised continued brace use during exacerbating activities.  Low back pain Chronic low back pain, possibly exacerbated by COVID-19 infection. - Prescribed Steripred for pain relief.         An Schnabel A. Vita MD [facility]

## 2024-07-22 NOTE — Telephone Encounter (Signed)
 Lab orders placed. Pt sent MyChart message with recommendations.   Catlin Doria S Laquasha Groome, NP

## 2024-07-22 NOTE — Telephone Encounter (Signed)
 Hi, insurance is asking for labs within last 120 days. Last labs I found are from 10/16/23. Can she please have updated lipid labs so we can finish this repatha  prior authorization? Thank you

## 2024-07-22 NOTE — Progress Notes (Signed)
 [name] [id number] 1950/10/10 73 y.o.  Subjective:   Patient ID:  [name] is a 73 y.o. (DOB 06/26/1951) female.  Chief Complaint: No chief complaint on file.   HPI [name] presents to the office today for follow-up of  MDD and insomnia.   Describes mood today as ok. Pleasant. Denies tearfulness. Mood symptoms - reports some depression, anxiety and irritability. Reports varying interest and motivation. Denies panic attacks. Reports decreased rumination. Denies worry and over thinking. Reports mood is lower. Stating overall, I haven't been feeling too good. Feels like current medications are helpful, but is willing to consider other options. Taking medications as prescribed.   Energy levels stable. Active, has a regular exercise routine. Enjoys some usual interests and activities. Lives alone - 2 cats. Has 2 children son 65 [name], daughter 63 in [place] and 2 grandchildren.  Appetite adequate. Weight loss - 138 to 145 pounds. Reports sleeping better some nights than others.  Focus and concentration stable. Completing tasks. Managing aspects of household. Retired. Denies SI or HI.  Denies AH or VH. Denies self harm. Denies substance use.  Reports alcohol use.  Previous medication trials: Zoloft, Lamictal  - low sodium, Celexa , Wellbutrin , Lexapro, Effexor , Vraylar    AUDIT    Flowsheet Row Office Visit from 10/16/2023 in [facility]  Alcohol Use Disorder Identification Test Final Score (AUDIT) 3    PHQ2-9    Flowsheet Row Clinical Support from 10/09/2023 in [facility] Clinical Support from 10/03/2022 in [facility] Office Visit from 05/08/2022 in [facility] for [facility] at [facility] Office Visit from 04/03/2022 in [facility] for [facility] at [facility] Office Visit from 03/02/2022 in [facility] for [facility] at [facility] Total Score  6 3 0 0 0  PHQ-9 Total Score 12 6 -- -- --   Flowsheet Row ED from 02/01/2024 in [facility] at [facility] UC from 11/15/2023 in [facility] at [facility] from 06/23/2023 in [facility] at [facility] RISK CATEGORY No Risk No Risk No Risk     Review of Systems:  Review of Systems  Musculoskeletal:  Negative for gait problem.  Neurological:  Negative for tremors.  Psychiatric/Behavioral:         Please refer to HPI    Medications: I have reviewed the patient's current medications.   Current Outpatient Medications  Medication Sig Dispense Refill   buPROPion  (WELLBUTRIN  XL) 150 MG 24 hr tablet Take 1 tablet (150 mg total) by mouth daily. 100 tablet 3   buPROPion  (WELLBUTRIN  XL) 300 MG 24 hr tablet Take 1 tablet (300 mg total) by mouth daily. 100 tablet 3   eszopiclone  (LUNESTA ) 2 MG TABS tablet Take 1 tablet (2 mg total) by mouth at bedtime. 100 tablet 2   Evolocumab  (REPATHA  SURECLICK) 140 MG/ML SOAJ Inject 140 mg into the skin every 14 (fourteen) days. 6 mL 3   fluticasone  (FLONASE ) 50 MCG/ACT nasal spray Place 2 sprays into both nostrils daily. 16 g 6   gabapentin  (NEURONTIN ) 600 MG tablet Take 1 tablet (600 mg total) by mouth every evening. 100 tablet 3   methylPREDNISolone  (MEDROL  DOSEPAK) 4 MG TBPK tablet Take as directed. 21 tablet 0   minoxidil  (LONITEN ) 2.5 MG tablet Take 0.5 tablets (1.25 mg total) by mouth daily for 30 days, THEN 1 tablet (2.5 mg total) daily thereafter. 90 tablet 0   nortriptyline  (  PAMELOR ) 10 MG capsule Take 1 capsule (10 mg total) by mouth at bedtime. 30 capsule 0   ondansetron  (ZOFRAN -ODT) 4 MG disintegrating tablet Dissolve 1-2 tablets under the tongue every 6 hours as needed 30 tablet 2   pantoprazole  (PROTONIX ) 40 MG tablet Take 1 tablet (40 mg total) by mouth 2 (two) times daily. 180 tablet 3   prazosin  (MINIPRESS ) 2 MG capsule Take 1 capsule (2 mg total) by mouth at bedtime. 100 capsule 3    topiramate  (TOPAMAX ) 25 MG tablet Take 2 tablets (50 mg total) by mouth daily. 100 tablet 0   topiramate  (TOPAMAX ) 25 MG tablet Take 2 tablets (50 mg total) by mouth daily. 200 tablet 3   valACYclovir  (VALTREX ) 1000 MG tablet Take 2 tablets (2,000 mg total) by mouth at first sign of outbreak and then 2 tablets 12 hours later. (Patient not taking: Reported on 07/22/2024) 4 tablet 6   Vitamin D , Ergocalciferol , (DRISDOL ) 1.25 MG (50000 UNIT) CAPS capsule Take 1 capsule by mouth every 7 days. 12 capsule 3   No current facility-administered medications for this visit.    Medication Side Effects: None  Allergies: Allergies[1]  Past Medical History:  Diagnosis Date   Anemia    Anxiety    Arthritis    Complication of anesthesia    past hx. laryngospasm following 2 past surgeries, not recent   Depression    Exertional dyspnea 08/04/2019   GERD (gastroesophageal reflux disease)    Hiatal hernia 10/17/2019   Hyperlipidemia    Insomnia    periodically uses med.   Perforated sigmoid colon (HCC) 04/28/2014   during colonoscopy, rupture colonbecame septic, has colostomy and open surgical wound   Pneumonia    Pre-diabetes    Prediabetes 08/09/2017   Primary hypertension 01/05/2023   Suicide attempt (HCC)    Vitamin D  deficiency     Past Medical History, Surgical history, Social history, and Family history were reviewed and updated as appropriate.   Please see review of systems for further details on the patient's review from today.   Objective:   Physical Exam:  LMP 08/07/1998   Physical Exam Constitutional:      General: She is not in acute distress. Musculoskeletal:        General: No deformity.  Neurological:     Mental Status: She is alert and oriented to person, place, and time.     Coordination: Coordination normal.  Psychiatric:        Attention and Perception: Attention and perception normal. She does not perceive auditory or visual hallucinations.        Mood and  Affect: Mood normal. Mood is not anxious or depressed. Affect is not labile, blunt, angry or inappropriate.        Speech: Speech normal.        Behavior: Behavior normal.        Thought Content: Thought content normal. Thought content is not paranoid or delusional. Thought content does not include homicidal or suicidal ideation. Thought content does not include homicidal or suicidal plan.        Cognition and Memory: Cognition and memory normal.        Judgment: Judgment normal.     Comments: Insight intact     Lab Review:     Component Value Date/Time   NA 128 (L) 02/28/2024 0940   K 4.8 02/28/2024 0940   CL 95 (L) 02/28/2024 0940   CO2 19 (L) 02/28/2024 0940   GLUCOSE 100 (H) 02/28/2024  0940   GLUCOSE 108 (H) 05/23/2023 1436   BUN 7 (L) 02/28/2024 0940   CREATININE 0.91 02/28/2024 0940   CREATININE 0.99 08/08/2017 1629   CALCIUM  9.6 02/28/2024 0940   PROT 6.5 02/28/2024 0940   ALBUMIN 4.4 02/28/2024 0940   AST 18 02/28/2024 0940   ALT 14 02/28/2024 0940   ALKPHOS 59 02/28/2024 0940   BILITOT 0.3 02/28/2024 0940   GFRNONAA 43 (L) 09/16/2022 1415   GFRNONAA 76 04/14/2014 1427   GFRAA 72 08/27/2019 1112   GFRAA 88 04/14/2014 1427       Component Value Date/Time   WBC 6.4 01/03/2024 1156   WBC 7.9 05/23/2023 1436   RBC 3.97 01/03/2024 1156   RBC 4.50 05/23/2023 1436   HGB 12.9 01/03/2024 1156   HGB 11.9 (L) 06/14/2015 1528   HCT 39.2 01/03/2024 1156   PLT 300 01/03/2024 1156   MCV 99 (H) 01/03/2024 1156   MCH 32.5 01/03/2024 1156   MCH 31.8 09/16/2022 1415   MCHC 32.9 01/03/2024 1156   MCHC 33.0 05/23/2023 1436   RDW 11.8 01/03/2024 1156   LYMPHSABS 1.7 01/03/2024 1156   MONOABS 0.5 05/23/2023 1436   EOSABS 0.2 01/03/2024 1156   BASOSABS 0.1 01/03/2024 1156    No results found for: POCLITH, LITHIUM   No results found for: PHENYTOIN, PHENOBARB, VALPROATE, CBMZ   .res Assessment: Plan:    Treatment Plan/Recommendations:   Plan:  PDMP  reviewed  Will continue: Gabapentin  600mg  at hs Minipress  2mg  at hs Lunesta  2mg  at hs Wellbutrin  XL 450mg  daily - denies seizure history. Topamax  25mg  - 2 at hs  Add Vraylar 1.5mg  - samples given - will take one capsule every other day.  RTC 2 months  25 minutes spent dedicated to the care of this patient on the date of this encounter to include pre-visit review of records, ordering of medication, post visit documentation, and face-to-face time with the patient discussing MDD and insomnia. Discussed continuing current medication regimen.  Patient advised to contact office with any questions, adverse effects, or acute worsening in signs and symptoms.  There are no diagnoses linked to this encounter.   Please see After Visit Summary for patient specific instructions.  Future Appointments  Date Time Provider Department Center  08/05/2024  1:00 PM Schuyler Damien HERO [id number] [id number]  08/21/2024  9:50 AM Skeet Juliene SAUNDERS, DO [facility] None  10/14/2024  3:30 PM [facility]-ANNUAL WELLNESS VISIT [facility] [id number]  10/16/2024  2:00 PM Vita Morrow, MD [facility] [id number]  01/12/2025  1:55 PM Cleotilde Ronal RAMAN, MD [facility] [phone] [address]    No orders of the defined types were placed in this encounter.   -------------------------------    [1]  Allergies Allergen Reactions   Bee Venom Swelling    Yellow jackets   Statins Other (See Comments)    Muscle pains  CRESTOR , LIPITOR

## 2024-07-22 NOTE — Telephone Encounter (Signed)
 She's getting her updated labs tomorrow. Thanks for letting us  know.   Jenin Birdsall S Elvira Langston, NP

## 2024-07-24 ENCOUNTER — Other Ambulatory Visit (HOSPITAL_BASED_OUTPATIENT_CLINIC_OR_DEPARTMENT_OTHER): Payer: Self-pay

## 2024-07-24 ENCOUNTER — Telehealth: Payer: Self-pay | Admitting: Pharmacy Technician

## 2024-07-24 LAB — LIPID PANEL
Chol/HDL Ratio: 1.5 ratio (ref 0.0–4.4)
Cholesterol, Total: 199 mg/dL (ref 100–199)
HDL: 135 mg/dL (ref >39)
LDL Chol Calc (NIH): 56 mg/dL (ref 0–99)
Triglycerides: 38 mg/dL (ref 0–149)
VLDL Cholesterol Cal: 8 mg/dL (ref 5–40)

## 2024-07-24 MED ORDER — MINOXIDIL 2.5 MG PO TABS
ORAL_TABLET | ORAL | 0 refills | Status: AC
  Filled 2024-07-24: qty 90, 90d supply, fill #0

## 2024-07-24 NOTE — Telephone Encounter (Signed)
 Pharmacy Patient Advocate Encounter   Received notification from CoverMyMeds that prior authorization for repatha  is required/requested.   Insurance verification completed.   The patient is insured through [facility] ADVANTAGE/RX ADVANCE.   Per test claim: PA required; PA submitted to above mentioned insurance via Latent Key/confirmation #/EOC [facility] Status is pending

## 2024-07-24 NOTE — Telephone Encounter (Signed)
 Pharmacy Patient Advocate Encounter   Received notification from CoverMyMeds that prior authorization for repatha  is required/requested.   Insurance verification completed.   The patient is insured through [facility] ADVANTAGE/RX ADVANCE.   Per test claim: PA required; PA submitted to above mentioned insurance via Latent Key/confirmation #/EOC [facility] Status is pending   Resubmitted with updated labs

## 2024-07-25 ENCOUNTER — Ambulatory Visit: Payer: Self-pay

## 2024-07-25 ENCOUNTER — Other Ambulatory Visit (HOSPITAL_BASED_OUTPATIENT_CLINIC_OR_DEPARTMENT_OTHER): Payer: Self-pay

## 2024-07-25 NOTE — Telephone Encounter (Signed)
 Pt scheduled appt

## 2024-07-25 NOTE — Telephone Encounter (Signed)
 FYI Only or Action Required?: FYI only for provider: home care.  Patient was last seen in primary care on 07/22/2024 by Vita Morrow, MD.  Called Nurse Triage reporting Covid Positive.  Symptoms began 2 weeks ago.  Interventions attempted: Prescription medications: steroids, flonase .  Symptoms are: unchanged.  Triage Disposition: Home Care  Patient/caregiver understands and will follow disposition?: Yes   Copied from CRM #[id number]. Topic: Clinical - Red Word Triage >> Jul 25, 2024  8:23 AM Ber[place] MATSU wrote: Red Word that prompted transfer to Nurse Triage: came in on 07/22/24 for covid- 19 and per patient she feels worse than she did on Tuesday. Cough and having to clear throat not improving Reason for Disposition  [1] COVID-19 diagnosed by positive lab test (e.g., PCR, rapid self-test kit) AND [2] mild symptoms (e.g., cough, fever, others) AND [3] no complications or SOB  Answer Assessment - Initial Assessment Questions 1. SYMPTOMS: What is your main symptom or concern? (e.g., cough, fever, shortness of breath, muscle aches)     cough 2. ONSET: When did the symptoms start?      2 weeks ago 3. COUGH: Do you have a cough? If Yes, ask: How bad is the cough?       Cough persistent, wakes at night 4. FEVER: Do you have a fever? If Yes, ask: What is your temperature, how was it measured, and when did it start?     no 5. BREATHING DIFFICULTY: Are you having any difficulty breathing? (e.g., normal; shortness of breath, wheezing, unable to speak)      no 6. BETTER-SAME-WORSE: Are you getting better, staying the same or getting worse compared to yesterday?  If getting worse, ask, In what way?     same 7. OTHER SYMPTOMS: Do you have any other symptoms?  (e.g., chills, fatigue, headache, loss of smell or taste, muscle pain, sore throat)     Body aches, fatigue 8. COVID-19 DIAGNOSIS: How do you know that you have COVID? (e.g., positive lab test or self-test, diagnosed by  doctor or NP/PA, symptoms after exposure).     Office visit 12/16 9. COVID-19 EXPOSURE: Was there any known exposure to COVID before the symptoms began?      yes 10. COVID-19 VACCINE: Have you had the COVID-19 vaccine? If Yes, ask: When did you last get it?        11. HIGH RISK DISEASE: Do you have any chronic medical problems? (e.g., asthma, heart or lung disease, weak immune system, obesity, etc.)       no  13. O2 SATURATION MONITOR:  Do you use an oxygen saturation monitor (pulse oximeter) at home? If Yes, ask What is your reading (oxygen level) today? What is your usual oxygen saturation reading? (e.g., 95%)  Protocols used: COVID-19 - Diagnosed or Suspected-A-AH

## 2024-07-25 NOTE — Telephone Encounter (Signed)
 Pharmacy Patient Advocate Encounter  Received notification from [employer] that Prior Authorization for repatha  has been APPROVED from 07/24/24 to 07/24/25   PA #/Case ID/Reference #: [id number]    Lf 05/06/24 84 days

## 2024-07-25 NOTE — Telephone Encounter (Signed)
Left voicemail for patient to call back to make an appointment.  

## 2024-07-25 NOTE — Telephone Encounter (Signed)
 [name]

## 2024-07-28 ENCOUNTER — Encounter: Payer: Self-pay | Admitting: Family Medicine

## 2024-07-28 ENCOUNTER — Ambulatory Visit: Admitting: Family Medicine

## 2024-07-28 ENCOUNTER — Other Ambulatory Visit (HOSPITAL_BASED_OUTPATIENT_CLINIC_OR_DEPARTMENT_OTHER): Payer: Self-pay

## 2024-07-28 VITALS — BP 134/98 | HR 91 | Temp 97.9°F | Wt 151.0 lb

## 2024-07-28 DIAGNOSIS — J0100 Acute maxillary sinusitis, unspecified: Secondary | ICD-10-CM

## 2024-07-28 DIAGNOSIS — R0981 Nasal congestion: Secondary | ICD-10-CM

## 2024-07-28 MED ORDER — AMOXICILLIN-POT CLAVULANATE 875-125 MG PO TABS
1.0000 | ORAL_TABLET | Freq: Two times a day (BID) | ORAL | 0 refills | Status: DC
  Filled 2024-07-28: qty 20, 10d supply, fill #0

## 2024-07-28 NOTE — Progress Notes (Signed)
 "  Name: [name]   Date of Visit: 07/28/2024   Date of last visit with me: 07/22/2024   CHIEF COMPLAINT:  Chief Complaint  Patient presents with   other    Cough, over two weeks, just finished steroid pak a day ago a little better,        HPI:  Discussed the use of AI scribe software for clinical note transcription with the patient, who gave verbal consent to proceed.  History of Present Illness   [name] is a 73 year old female who presents with persistent sinus congestion and productive cough.  She has been experiencing ongoing sinus congestion and a productive cough for over two weeks. The cough is characterized by significant mucus production, especially in the morning, and nasal congestion is most noticeable upon waking. Despite using Flonase , her symptoms persist.  She describes fluid and popping sensations in her ears, which had temporarily improved but have since returned.  Her elbow pain, which had previously improved with steroid treatment, has returned. The elbow is tender to touch but does not affect her ability to lift objects.  She feels hyperactive, attributing this to steroid use, and notes an episode of waking up at 3 AM to bake cookies. She also mentions a slight weight gain and a lack of appetite for three meals a day.  She wonders if her blood pressure could be affected by how she is feeling and possibly by the effects of steroids.         OBJECTIVE:       10/09/2023    3:41 PM  Depression screen PHQ 2/9  Decreased Interest 3  Down, Depressed, Hopeless 3  PHQ - 2 Score 6  Altered sleeping 0  Tired, decreased energy 0  Change in appetite 3  Feeling bad or failure about yourself  2  Trouble concentrating 1  Moving slowly or fidgety/restless 0  Suicidal thoughts 0  PHQ-9 Score 12   Difficult doing work/chores Somewhat difficult     Data saved with a previous flowsheet row definition     BP Readings from Last 3  Encounters:  07/28/24 (!) 134/98  07/22/24 122/84  02/13/24 134/77    BP (!) 134/98   Pulse 91   Temp 97.9 F (36.6 C)   Wt 151 lb (68.5 kg)   LMP 08/07/1998   SpO2 100%   BMI 24.37 kg/m    Physical Exam   CHEST: Lungs clear to auscultation.      Physical Exam Constitutional:      Appearance: Normal appearance.  Cardiovascular:     Rate and Rhythm: Normal rate and regular rhythm.     Pulses: Normal pulses.     Heart sounds: Normal heart sounds.  Pulmonary:     Effort: Pulmonary effort is normal. No respiratory distress.     Breath sounds: Normal breath sounds. No stridor. No wheezing or rhonchi.  Neurological:     General: No focal deficit present.     Mental Status: She is alert and oriented to person, place, and time. Mental status is at baseline.     ASSESSMENT/PLAN:   Assessment & Plan Sinus congestion  Acute non-recurrent maxillary sinusitis    Assessment and Plan    Acute maxillary sinusitis Persistent sinus congestion with productive cough, nasal congestion, and ear popping. Symptoms suggest post-viral bacterial superinfection. No evidence of pneumonia. Blood pressure slightly elevated, likely due to current symptoms and steroid use. - Prescribed Augmentin  875 mg  twice daily for 10 days. - Continue Flonase  for nasal congestion. - Advised use of a humidifier, especially at night, to help liquefy and drain mucus. - Advised taking Augmentin  with food to minimize gastrointestinal side effects.         Zalyn Amend A. Vita MD [facility] "

## 2024-08-05 ENCOUNTER — Ambulatory Visit: Admitting: Licensed Clinical Social Worker

## 2024-08-11 ENCOUNTER — Other Ambulatory Visit (HOSPITAL_BASED_OUTPATIENT_CLINIC_OR_DEPARTMENT_OTHER): Payer: Self-pay

## 2024-08-13 ENCOUNTER — Other Ambulatory Visit (HOSPITAL_BASED_OUTPATIENT_CLINIC_OR_DEPARTMENT_OTHER): Payer: Self-pay

## 2024-08-21 ENCOUNTER — Ambulatory Visit: Admitting: Neurology

## 2024-08-22 ENCOUNTER — Other Ambulatory Visit: Payer: Self-pay | Admitting: Gastroenterology

## 2024-08-22 ENCOUNTER — Other Ambulatory Visit (HOSPITAL_BASED_OUTPATIENT_CLINIC_OR_DEPARTMENT_OTHER): Payer: Self-pay

## 2024-08-25 ENCOUNTER — Ambulatory Visit: Admitting: Family Medicine

## 2024-08-25 ENCOUNTER — Other Ambulatory Visit: Payer: Self-pay | Admitting: Gastroenterology

## 2024-08-25 ENCOUNTER — Telehealth: Payer: Self-pay | Admitting: Family Medicine

## 2024-08-25 ENCOUNTER — Other Ambulatory Visit: Payer: Self-pay

## 2024-08-25 ENCOUNTER — Encounter: Payer: Self-pay | Admitting: Family Medicine

## 2024-08-25 ENCOUNTER — Other Ambulatory Visit (HOSPITAL_BASED_OUTPATIENT_CLINIC_OR_DEPARTMENT_OTHER): Payer: Self-pay

## 2024-08-25 VITALS — BP 138/80 | HR 96 | Wt 150.0 lb

## 2024-08-25 DIAGNOSIS — M7702 Medial epicondylitis, left elbow: Secondary | ICD-10-CM

## 2024-08-25 MED ORDER — PANTOPRAZOLE SODIUM 40 MG PO TBEC
40.0000 mg | DELAYED_RELEASE_TABLET | Freq: Two times a day (BID) | ORAL | 0 refills | Status: AC
  Filled 2024-08-25: qty 180, 90d supply, fill #0

## 2024-08-25 MED ORDER — MELOXICAM 15 MG PO TABS
15.0000 mg | ORAL_TABLET | Freq: Every day | ORAL | 0 refills | Status: AC
  Filled 2024-08-25: qty 30, 30d supply, fill #0

## 2024-08-25 NOTE — Patient Instructions (Signed)
 VISIT SUMMARY:  During your visit, we discussed your worsening elbow pain and its impact on your daily activities. We reviewed your medical history, including previous surgeries and treatments for similar issues.  YOUR PLAN:  -MEDIAL EPICONDYLITIS OF LEFT ELBOW: Medial epicondylitis, also known as golfer's elbow, is a condition where the tendons on the inside of your elbow become inflamed, causing pain. We will manage this conservatively to avoid potential tendon weakening from steroids and due to your past side effects. You are prescribed meloxicam  to take once daily for two weeks with food. You should also start physical therapy to strengthen the tendons and ice your elbow twice daily for 20 minutes. Avoid applying heat to the area. We will reassess your condition in two weeks to see if there is any improvement. If conservative measures are not sufficient, we may discuss the possibility of a steroid injection, fenestration, or a PRP injection, though the latter is costly and not covered by insurance.  INSTRUCTIONS:  Please follow up in two weeks to reassess your elbow pain and the effectiveness of the current treatment plan.

## 2024-08-25 NOTE — Progress Notes (Signed)
" ° °  Name: CHANDEL [name]   Date of Visit: 08/25/24   Date of last visit with me: 08/25/2024   CHIEF COMPLAINT:  Chief Complaint  Patient presents with   Acute Visit    Left Elbow injection? Hurts to touch.        HPI:  Discussed the use of AI scribe software for clinical note transcription with the patient, who gave verbal consent to proceed.  History of Present Illness   [name] is a 74 year old female who presents with worsening elbow pain.  She describes the elbow pain as constant and exacerbated by pressure on a specific spot, significantly interfering with daily activities such as lifting pots and pans. She has a history of tennis elbow, which previously required surgery after three unsuccessful steroid injections.  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 Recently, she experienced numbness in her fingers lasting almost 24 hours, which has since improved. She noticed this numbness upon waking, despite not sleeping on that side.  She has not been taking any medication for the current elbow pain and has not engaged in physical therapy. She is reluctant to use steroids due to past weight gain. She has a history of knee replacement and a broken wrist, which have also impacted her physical activity.  She recalls previous consultations with orthopedic specialists and expresses dissatisfaction with some past medical experiences, including a negative experience with a foot surgery consultation. She has not been taking any NSAIDs like ibuprofen  for the elbow pain.         OBJECTIVE:       10/09/2023    3:41 PM  Depression screen PHQ 2/9  Decreased Interest 3  Down, Depressed, Hopeless 3  PHQ - 2 Score 6  Altered sleeping 0  Tired, decreased energy 0  Change in appetite 3  Feeling bad or failure about yourself  2  Trouble concentrating 1  Moving slowly or fidgety/restless 0  Suicidal thoughts 0  PHQ-9  Score 12   Difficult doing work/chores Somewhat difficult     Data saved with a previous flowsheet row definition     BP Readings from Last 3 Encounters:  08/25/24 138/80  07/28/24 (!) 134/98  07/22/24 122/84    BP 138/80   Pulse 96   Wt 150 lb (68 kg)   LMP 08/07/1998   SpO2 97%   BMI 24.21 kg/m    Physical Exam          Physical Exam   TTP over medial epicondlyle. No pain with flexion or extension of the elbow.  Range of motion is full. 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 Conservative management preferred due to potential tendon weakening from steroids and patient's past side effects. - Prescribed meloxicam  once daily for two weeks with food. - Referred to physical therapy for tendon strengthening exercises. - Instructed to ice the elbow twice daily for 20 minutes. - Advised against heat application. - Reassess after two weeks to evaluate improvement. - Discuss potential for steroid injection and fenestration if conservative measures are insufficient. - Consider PRP injection if conservative measures are insufficient, noting it is out-of-pocket and costly.         Charnae Lill A. Vita MD [facility] "

## 2024-08-25 NOTE — Telephone Encounter (Signed)
 Copied from CRM [id number]. Topic: General - Call Back - No Documentation >> Aug 25, 2024  8:52 AM Rea BROCKS wrote: Reason for CRM: Pt would like to know if Dr. Vita does injections for elbows. Pt saw him and told him that she has golfers elbow. He told her that she can treat some orthopedic things. But, she just want to be sure just in case she needs to call her Ortho provider instead? Can pt receive a call for clarification before she schedules.    [phone] (M)

## 2024-08-26 ENCOUNTER — Ambulatory Visit: Admitting: Licensed Clinical Social Worker

## 2024-09-02 ENCOUNTER — Ambulatory Visit: Admitting: Licensed Clinical Social Worker

## 2024-09-03 ENCOUNTER — Other Ambulatory Visit: Payer: Self-pay

## 2024-09-04 ENCOUNTER — Ambulatory Visit

## 2024-09-08 ENCOUNTER — Other Ambulatory Visit: Payer: Self-pay

## 2024-09-09 ENCOUNTER — Ambulatory Visit: Admitting: Licensed Clinical Social Worker

## 2024-09-16 ENCOUNTER — Ambulatory Visit: Admitting: Licensed Clinical Social Worker

## 2024-09-22 ENCOUNTER — Ambulatory Visit: Admitting: Adult Health

## 2024-10-14 ENCOUNTER — Ambulatory Visit: Admitting: Family Medicine

## 2024-10-14 ENCOUNTER — Ambulatory Visit: Payer: Self-pay

## 2024-10-16 ENCOUNTER — Encounter: Payer: Self-pay | Admitting: Family Medicine

## 2025-01-12 ENCOUNTER — Ambulatory Visit (HOSPITAL_BASED_OUTPATIENT_CLINIC_OR_DEPARTMENT_OTHER): Admitting: Obstetrics & Gynecology
